# Patient Record
Sex: Female | Born: 1965
Health system: Southern US, Community
[De-identification: ages and names within clinical notes are randomized; demographics above are authoritative.]

## PROBLEM LIST (undated history)

## (undated) DIAGNOSIS — I10 Essential (primary) hypertension: Secondary | ICD-10-CM

## (undated) DIAGNOSIS — I219 Acute myocardial infarction, unspecified: Secondary | ICD-10-CM

## (undated) DIAGNOSIS — E7800 Pure hypercholesterolemia, unspecified: Secondary | ICD-10-CM

## (undated) DIAGNOSIS — I509 Heart failure, unspecified: Secondary | ICD-10-CM

## (undated) DIAGNOSIS — E119 Type 2 diabetes mellitus without complications: Secondary | ICD-10-CM

## (undated) DIAGNOSIS — I639 Cerebral infarction, unspecified: Secondary | ICD-10-CM

## (undated) DIAGNOSIS — C539 Malignant neoplasm of cervix uteri, unspecified: Secondary | ICD-10-CM

## (undated) DIAGNOSIS — R87629 Unspecified abnormal cytological findings in specimens from vagina: Secondary | ICD-10-CM

## (undated) HISTORY — DX: Heart failure, unspecified: I50.9

## (undated) HISTORY — PX: TUBAL LIGATION: SHX77

## (undated) HISTORY — DX: Cerebral infarction, unspecified: I63.9

## (undated) HISTORY — DX: Unspecified abnormal cytological findings in specimens from vagina: R87.629

---

## 1990-02-21 HISTORY — PX: CHOLECYSTECTOMY: SHX55

## 1998-02-21 DIAGNOSIS — E119 Type 2 diabetes mellitus without complications: Secondary | ICD-10-CM

## 1998-02-21 HISTORY — DX: Type 2 diabetes mellitus without complications: E11.9

## 2002-02-21 HISTORY — PX: CORONARY STENT PLACEMENT: SHX1402

## 2002-11-22 ENCOUNTER — Inpatient Hospital Stay (HOSPITAL_COMMUNITY): Admission: RE | Admit: 2002-11-22 | Discharge: 2002-11-25 | Payer: Self-pay | Admitting: Cardiology

## 2014-06-19 ENCOUNTER — Emergency Department (HOSPITAL_COMMUNITY)
Admission: EM | Admit: 2014-06-19 | Discharge: 2014-06-19 | Disposition: A | Discharge status: Home / Self Care | Payer: Self-pay | Attending: Emergency Medicine | Admitting: Emergency Medicine

## 2014-06-19 ENCOUNTER — Encounter (HOSPITAL_COMMUNITY): Payer: Self-pay | Admitting: *Deleted

## 2014-06-19 DIAGNOSIS — A599 Trichomoniasis, unspecified: Secondary | ICD-10-CM

## 2014-06-19 DIAGNOSIS — I159 Secondary hypertension, unspecified: Secondary | ICD-10-CM | POA: Insufficient documentation

## 2014-06-19 DIAGNOSIS — I252 Old myocardial infarction: Secondary | ICD-10-CM | POA: Insufficient documentation

## 2014-06-19 DIAGNOSIS — E780 Pure hypercholesterolemia: Secondary | ICD-10-CM | POA: Insufficient documentation

## 2014-06-19 DIAGNOSIS — A5901 Trichomonal vulvovaginitis: Secondary | ICD-10-CM | POA: Insufficient documentation

## 2014-06-19 DIAGNOSIS — R739 Hyperglycemia, unspecified: Secondary | ICD-10-CM

## 2014-06-19 DIAGNOSIS — N390 Urinary tract infection, site not specified: Secondary | ICD-10-CM | POA: Insufficient documentation

## 2014-06-19 DIAGNOSIS — E1165 Type 2 diabetes mellitus with hyperglycemia: Secondary | ICD-10-CM | POA: Insufficient documentation

## 2014-06-19 DIAGNOSIS — N730 Acute parametritis and pelvic cellulitis: Secondary | ICD-10-CM | POA: Insufficient documentation

## 2014-06-19 HISTORY — DX: Type 2 diabetes mellitus without complications: E11.9

## 2014-06-19 HISTORY — DX: Pure hypercholesterolemia, unspecified: E78.00

## 2014-06-19 HISTORY — DX: Essential (primary) hypertension: I10

## 2014-06-19 HISTORY — DX: Acute myocardial infarction, unspecified: I21.9

## 2014-06-19 LAB — WET PREP, GENITAL: YEAST WET PREP: NONE SEEN

## 2014-06-19 LAB — URINALYSIS, ROUTINE W REFLEX MICROSCOPIC
Bilirubin Urine: NEGATIVE
Glucose, UA: >1000 mg/dL — AB
KETONES UR: NEGATIVE mg/dL
NITRITE: POSITIVE — AB
Protein, ur: 30 mg/dL — AB
Specific Gravity, Urine: 1.029 (ref 1.005–1.030)
Urobilinogen, UA: 0.2 mg/dL (ref 0.0–1.0)
pH: 5.5 (ref 5.0–8.0)

## 2014-06-19 LAB — URINE MICROSCOPIC-ADD ON

## 2014-06-19 LAB — RAPID HIV SCREEN (HIV 1/2 AB+AG)
HIV 1/2 ANTIBODIES: NONREACTIVE
HIV-1 P24 Antigen - HIV24: NONREACTIVE

## 2014-06-19 LAB — CBG MONITORING, ED: Glucose-Capillary: 275 mg/dL — ABNORMAL HIGH (ref 70–99)

## 2014-06-19 MED ORDER — DOXYCYCLINE HYCLATE 100 MG PO CAPS: 100.0000 mg | ORAL_CAPSULE | Freq: Two times a day (BID) | ORAL | Status: DC

## 2014-06-19 MED ORDER — CEPHALEXIN 500 MG PO CAPS: 500.0000 mg | ORAL_CAPSULE | Freq: Four times a day (QID) | ORAL | Status: DC

## 2014-06-19 MED ORDER — LIDOCAINE HCL (PF) 1 % IJ SOLN
INTRAMUSCULAR | Status: AC
  Administered 2014-06-19: 5 mL
  Filled 2014-06-19: qty 5

## 2014-06-19 MED ORDER — METRONIDAZOLE 500 MG PO TABS
2000.0000 mg | ORAL_TABLET | Freq: Once | ORAL | Status: AC
  Administered 2014-06-19: 2000 mg via ORAL
  Filled 2014-06-19: qty 4

## 2014-06-19 MED ORDER — METFORMIN HCL 500 MG PO TABS: 500.0000 mg | ORAL_TABLET | Freq: Two times a day (BID) | ORAL | Status: DC

## 2014-06-19 MED ORDER — AZITHROMYCIN 250 MG PO TABS
1000.0000 mg | ORAL_TABLET | Freq: Once | ORAL | Status: AC
  Administered 2014-06-19: 1000 mg via ORAL
  Filled 2014-06-19: qty 4

## 2014-06-19 MED ORDER — CEFTRIAXONE SODIUM 250 MG IJ SOLR
250.0000 mg | Freq: Once | INTRAMUSCULAR | Status: AC
  Administered 2014-06-19: 250 mg via INTRAMUSCULAR
  Filled 2014-06-19: qty 250

## 2014-06-19 NOTE — Care Management Note (Signed)
Case Management Note  Patient Details  Name: Monica Carlson MRN: [id number] Date of Birth: 1965/05/07  Subjective/Objective:   Pt states she stays at friends home, but sleeps in her car.  She has not taken medications in years; as she has not insurance.                         Action/Plan:  Taiden Raybourn J. Clydene Laming, RN, [place], [place] [phone] ED CM consulted to meet with patient concerning f/u care with PCP and patient does not have insurance. Pt presented to [facility] ED today with vaginal pain.  Met with patient at bedside, confirmed informaton. Pt  reports not having access to f/u care with PCP, or insurance coverage. Discussed with patient importance and benefits of  establishing PCP, and not utilizing the ED for primary care needs. Pt verbalized understanding and is in agreement. Discussed other options, provided list of local  affordable PCPs.  Pt voiced interest in the [facility] and [place].  [facility] Brochure given with address, phone number, and the services highlighted. Explained that there is a Customer service manager on site who will assist with [employer] and process. Instructed to call or walk in Monday - Friday between the hours of 9- 5:30p to schedule appt for establishing care for PCP. Pt verbalized understanding.   LCSW consulted to provide homeless infomation in her county.    Expected Discharge Date:  06/19/14               Expected Discharge Plan:  Home/Self Care  In-House Referral:  Clinical Social Work  Discharge planning Services  CM Consult  Post Acute Care Choice:  NA Choice offered to:  NA  DME Arranged:    DME Agency:     HH Arranged:  NA HH Agency:     Status of Service:  Completed, signed off  Medicare Important Message Given:    Date Medicare IM Given:    Medicare IM give by:    Date Additional Medicare IM Given:    Additional Medicare Important Message give by:     If discussed at [place] of Stay Meetings, dates discussed:     Additional Comments:  Fuller Mandril, RN 06/19/2014, 9:06 AM

## 2014-06-19 NOTE — Progress Notes (Signed)
Chart reviewed.  Spoke with pt concerning his medications and not being able to afford them.  Explained the [place]- Medication Assistance program to pt. Pt understands that the [facility] program is only a one time in one year from the date of discharge to next year. Pt also understands that there is a $3.00 co pay for each prescription.

## 2014-06-19 NOTE — ED Provider Notes (Signed)
CSN: [id number]     Arrival date & time 06/19/14  0813 History   First MD Initiated Contact with Patient 06/19/14 [phone]     Chief Complaint  Patient presents with  . Vaginal Discharge     (Consider location/radiation/quality/duration/timing/severity/associated sxs/prior Treatment) HPI Monica Carlson is a 49 y.o. female with history of diabetes, hypertension, MI, presents to emergency department complaining of vaginal discharge. Patient states she notices discharge about a month ago. She states his wife, copious. She admits to being sexually active with 1 partner. She denies any pain with intercourse. She denies using any personal products other than douching after intercourse . She denies any abdominal pain. She denies any pelvic pain. She denies any dysuria, hematuria, urinary frequency or urgency. She states she has not had a menstrual cycle in a few years. She denies any fever or chills. She states she is currently not taking her blood pressure diabetes medications because she does not have any money for it. She does not have a primary care doctor. She states she lives in a car.  Past Medical History  Diagnosis Date  . MI (myocardial infarction)   . Diabetes mellitus without complication   . Hypertension   . High cholesterol    Past Surgical History  Procedure Laterality Date  . Coronary stent placement    . Cholecystectomy     No family history on file. History  Substance Use Topics  . Smoking status: Never Smoker   . Smokeless tobacco: Not on file  . Alcohol Use: No   OB History    No data available     Review of Systems  Constitutional: Negative for fever and chills.  Respiratory: Negative for cough, chest tightness and shortness of breath.   Cardiovascular: Negative for chest pain, palpitations and leg swelling.  Gastrointestinal: Negative for nausea, vomiting, abdominal pain and diarrhea.  Genitourinary: Positive for vaginal discharge. Negative for dysuria, flank pain,  vaginal bleeding, vaginal pain and pelvic pain.  Musculoskeletal: Negative for myalgias, arthralgias, neck pain and neck stiffness.  Skin: Negative for rash.  Neurological: Negative for dizziness, weakness and headaches.  All other systems reviewed and are negative.     Allergies  Review of patient's allergies indicates no known allergies.  Home Medications   Prior to Admission medications   Not on File   BP 173/105 mmHg  Pulse 90  Temp(Src) 97.9 F (36.6 C) (Oral)  Resp 19  SpO2 98% Physical Exam  Constitutional: She appears well-developed and well-nourished. No distress.  HENT:  Head: Normocephalic.  Eyes: Conjunctivae are normal.  Neck: Neck supple.  Cardiovascular: Normal rate, regular rhythm and normal heart sounds.   Pulmonary/Chest: Effort normal and breath sounds normal. No respiratory distress. She has no wheezes. She has no rales.  Abdominal: Soft. Bowel sounds are normal. She exhibits no distension. There is no tenderness. There is no rebound and no guarding.  Genitourinary:  Mild erythema of labia minora, otherwise unremarkable external genitalia. Yellowish green thick vaginal and cervical discharge. Cervix erythematous, friable. Positive CMT and uterine tenderness. No adnexal tenderness  Musculoskeletal: She exhibits no edema.  Neurological: She is alert.  Skin: Skin is warm and dry.  Psychiatric: She has a normal mood and affect. Her behavior is normal.  Nursing note and vitals reviewed.   ED Course  Procedures (including critical care time) Labs Review Labs Reviewed  WET PREP, GENITAL  URINALYSIS, ROUTINE W REFLEX MICROSCOPIC  GC/CHLAMYDIA PROBE AMP ()    Imaging Review No  results found.   EKG Interpretation None      MDM   Final diagnoses:  PID (acute pelvic inflammatory disease)  Trichimoniasis  UTI (lower urinary tract infection)  Hyperglycemia  Secondary hypertension, unspecified    Pt in ED with vaginal discharge. Exam  suspicious for PID. STI panel drawn. UA ordered. Pt apparently also not taking her diabetes and BP medications and lives in a car. Will contact case management.   10:45 AM Pt's wet prep and UA positive for trichomonas. Pt covered with rocephin 250mg  IM, zithromax 1g PO, flagyl 2g PO. She will go home with doxycycline and keflex for PID and UTI. She is otherwise non toxic appearing. She is afebrile with no signs of systemic illness.  CBG 275. Will restart metformin.   Case management spoke with pt, she ws given resourced to contact for PCP. She was also matched to help her fill her doxycycline. She will follow up as soon as she can. Return precautions discussed.   Filed Vitals:   06/19/14 0823 06/19/14 0915 06/19/14 0930 06/19/14 0938  BP: 173/105 147/96 163/75 163/75  Pulse: 90 83 79 80  Temp: 97.9 F (36.6 C)     TempSrc: Oral     Resp: 19   18  SpO2: 98% 94% 95% 96%       Jeannett Senior, PA-C 06/19/14 [place], MD 06/19/14 1048

## 2014-06-19 NOTE — ED Notes (Signed)
CBG = 275  RN Santiago Glad informed of result.

## 2014-06-19 NOTE — Discharge Instructions (Signed)
Take metformin for your blood sugar daily. Take doxycycline and keflex until all gone for your pelvic infection and uti. Make sure your partner gets treated as well. Follow up with primary care doctor as soon as able. Return if worsening symptoms.   Pelvic Inflammatory Disease Pelvic inflammatory disease (PID) refers to an infection in some or all of the female organs. The infection can be in the uterus, ovaries, fallopian tubes, or the surrounding tissues in the pelvis. PID can cause abdominal or pelvic pain that comes on suddenly (acute pelvic pain). PID is a serious infection because it can lead to lasting (chronic) pelvic pain or the inability to have children (infertile).  CAUSES  The infection is often caused by the normal bacteria found in the vaginal tissues. PID may also be caused by an infection that is spread during sexual contact. PID can also occur following:   The birth of a baby.   A miscarriage.   An abortion.   Major pelvic surgery.   The use of an intrauterine device (IUD).   A sexual assault.  RISK FACTORS Certain factors can put a person at higher risk for PID, such as:  Being younger than 25 years.  Being sexually active at [place] age.  Usingnonbarrier contraception.  Havingmultiple sexual partners.  Having sex with someone who has symptoms of a genital infection.  Using oral contraception. Other times, certain behaviors can increase the possibility of getting PID, such as:  Having sex during your period.  Using a vaginal douche.  Having an intrauterine device (IUD) in place. SYMPTOMS   Abdominal or pelvic pain.   Fever.   Chills.   Abnormal vaginal discharge.  Abnormal uterine bleeding.   Unusual pain shortly after finishing your period. DIAGNOSIS  Your caregiver will choose some of the following methods to make a diagnosis, such as:   Performinga physical exam and history. A pelvic exam typically reveals a very tender uterus  and surrounding pelvis.   Ordering laboratory tests including a pregnancy test, blood tests, and urine test.  Orderingcultures of the vagina and cervix to check for a sexually transmitted infection (STI).  Performing an ultrasound.   Performing a laparoscopic procedure to look inside the pelvis.  TREATMENT   Antibiotic medicines may be prescribed and taken by mouth.   Sexual partners may be treated when the infection is caused by a sexually transmitted disease (STD).   Hospitalization may be needed to give antibiotics intravenously.  Surgery may be needed, but this is rare. It may take weeks until you are completely well. If you are diagnosed with PID, you should also be checked for human immunodeficiency virus (HIV). HOME CARE INSTRUCTIONS   If given, take your antibiotics as directed. Finish the medicine even if you start to feel better.   Only take over-the-counter or prescription medicines for pain, discomfort, or fever as directed by your caregiver.   Do not have sexual intercourse until treatment is completed or as directed by your caregiver. If PID is confirmed, your recent sexual partner(s) will need treatment.   Keep your follow-up appointments. SEEK MEDICAL CARE IF:   You have increased or abnormal vaginal discharge.   You need prescription medicine for your pain.   You vomit.   You cannot take your medicines.   Your partner has an STD.  SEEK IMMEDIATE MEDICAL CARE IF:   You   have a fever.   You have increased abdominal or pelvic pain.   You have chills.  You have pain when you urinate.   You are not better after 72 hours following treatment.  MAKE SURE YOU:   Understand these instructions.  Will watch your condition.  Will get help right away if you are not doing well or get worse. Document Released: 02/07/2005 Document Revised: 06/04/2012 Document Reviewed: 02/03/2011 [facility] Patient Information 2015 [place], [place].  This information is not intended to replace advice given to you by your health care provider. Make sure you discuss any questions you have with your health care provider.  Urinary Tract Infection Urinary tract infections (UTIs) can develop anywhere along your urinary tract. Your urinary tract is your body's drainage system for removing wastes and extra water. Your urinary tract includes two kidneys, two ureters, a bladder, and a urethra. Your kidneys are a pair of bean-shaped organs. Each kidney is about the size of your fist. They are located below your ribs, one on each side of your spine. CAUSES Infections are caused by microbes, which are microscopic organisms, including fungi, viruses, and bacteria. These organisms are so small that they can only be seen through a microscope. Bacteria are the microbes that most commonly cause UTIs. SYMPTOMS  Symptoms of UTIs may vary by age and gender of the patient and by the location of the infection. Symptoms in young women typically include a frequent and intense urge to urinate and a painful, burning feeling in the bladder or urethra during urination. Older women and men are more likely to be tired, shaky, and weak and have muscle aches and abdominal pain. A fever may mean the infection is in your kidneys. Other symptoms of a kidney infection include pain in your back or sides below the ribs, nausea, and vomiting. DIAGNOSIS To diagnose a UTI, your caregiver will ask you about your symptoms. Your caregiver also will ask to provide a urine sample. The urine sample will be tested for bacteria and white blood cells. White blood cells are made by your body to help fight infection. TREATMENT  Typically, UTIs can be treated with medication. Because most UTIs are caused by a bacterial infection, they usually can be treated with the use of antibiotics. The choice of antibiotic and length of treatment depend on your symptoms and the type of bacteria causing your  infection. HOME CARE INSTRUCTIONS  If you were prescribed antibiotics, take them exactly as your caregiver instructs you. Finish the medication even if you feel better after you have only taken some of the medication.  Drink enough water and fluids to keep your urine clear or pale yellow.  Avoid caffeine, tea, and carbonated beverages. They tend to irritate your bladder.  Empty your bladder often. Avoid holding urine for long periods of time.  Empty your bladder before and after sexual intercourse.  After a bowel movement, women should cleanse from front to back. Use each tissue only once. SEEK MEDICAL CARE IF:   You have back pain.  You develop a fever.  Your symptoms do not begin to resolve within 3 days. SEEK IMMEDIATE MEDICAL CARE IF:   You have severe back pain or lower abdominal pain.  You develop chills.  You have nausea or vomiting.  You have continued burning or discomfort with urination. MAKE SURE YOU:   Understand these instructions.  Will watch your condition.  Will get help right away if you are not doing well or get worse. Document Released: 11/17/2004 Document Revised: 08/09/2011 Document Reviewed: 03/18/2011 [facility] Patient Information 2015 [place], [place]. This information is not  intended to replace advice given to you by your health care provider. Make sure you discuss any questions you have with your health care provider. ° °

## 2014-06-19 NOTE — ED Notes (Signed)
Pt reports vaginal discharge x 1 month. No urinary symptoms.

## 2014-06-19 NOTE — Discharge Planning (Signed)
NCM provided [facility].

## 2014-06-19 NOTE — ED Notes (Signed)
Pelvic cart at bedside. 

## 2014-06-20 LAB — GC/CHLAMYDIA PROBE AMP (~~LOC~~) NOT AT ARMC
Chlamydia: NEGATIVE
NEISSERIA GONORRHEA: NEGATIVE

## 2014-06-20 LAB — RPR: RPR Ser Ql: NONREACTIVE

## 2014-07-23 DIAGNOSIS — I509 Heart failure, unspecified: Secondary | ICD-10-CM

## 2014-07-23 HISTORY — DX: Heart failure, unspecified: I50.9

## 2015-04-29 ENCOUNTER — Encounter: Payer: Self-pay | Admitting: Physician Assistant

## 2015-04-29 ENCOUNTER — Ambulatory Visit: Payer: Self-pay | Admitting: Physician Assistant

## 2015-04-29 VITALS — BP 146/84 | HR 78 | Temp 97.9°F | Ht 62.75 in | Wt 179.0 lb

## 2015-04-29 DIAGNOSIS — E118 Type 2 diabetes mellitus with unspecified complications: Principal | ICD-10-CM

## 2015-04-29 DIAGNOSIS — E1165 Type 2 diabetes mellitus with hyperglycemia: Secondary | ICD-10-CM

## 2015-04-29 DIAGNOSIS — Z131 Encounter for screening for diabetes mellitus: Secondary | ICD-10-CM

## 2015-04-29 DIAGNOSIS — I43 Cardiomyopathy in diseases classified elsewhere: Secondary | ICD-10-CM

## 2015-04-29 DIAGNOSIS — Z8673 Personal history of transient ischemic attack (TIA), and cerebral infarction without residual deficits: Secondary | ICD-10-CM

## 2015-04-29 DIAGNOSIS — E669 Obesity, unspecified: Secondary | ICD-10-CM

## 2015-04-29 DIAGNOSIS — Z955 Presence of coronary angioplasty implant and graft: Secondary | ICD-10-CM

## 2015-04-29 DIAGNOSIS — I2510 Atherosclerotic heart disease of native coronary artery without angina pectoris: Secondary | ICD-10-CM

## 2015-04-29 DIAGNOSIS — I10 Essential (primary) hypertension: Secondary | ICD-10-CM

## 2015-04-29 DIAGNOSIS — I110 Hypertensive heart disease with heart failure: Secondary | ICD-10-CM

## 2015-04-29 DIAGNOSIS — I252 Old myocardial infarction: Secondary | ICD-10-CM

## 2015-04-29 LAB — GLUCOSE, POCT (MANUAL RESULT ENTRY): POC Glucose: 88 mg/dl (ref 70–99)

## 2015-04-29 NOTE — Progress Notes (Signed)
BP 146/84 mmHg  Pulse 78  Temp(Src) 97.9 F (36.6 C)  Ht 5' 2.75" (1.594 m)  Wt 179 lb (81.194 kg)  BMI 31.96 kg/m2  SpO2 97%   Subjective:    Patient ID: Monica Carlson, female    DOB: [birth year], 50 y.o.   MRN: [id number]  HPI: Monica Carlson is a 50 y.o. female presenting on 04/29/2015 for New Patient (Initial Visit)   HPI   [facility]  Pt [facility] to [facility] to [facility] but that was 3 years ago.  All the meds she is taking now were given from when she was in the hospital last august with CVA.    Aug 2016- cva- [facility] June 2016 - chf- [facility]  Pt was on lipitor but stopped it= couldn't afford it  Echo- 07/31/14- [facility], EF 50-55% [facility]- head ct/10/03/14 Brain mri/10/03/14 Neck MRA-10/03/14 Head MRA8/12/16 Carotid US- 10/06/14   Relevant past medical, surgical, family and social history reviewed and updated as indicated. Interim medical history since our last visit reviewed. Allergies and medications reviewed and updated.    Current outpatient prescriptions:  .  acetaminophen (TYLENOL) 500 MG tablet, Take 1,500 mg by mouth daily as needed for headache., Disp: , Rfl:  .  aspirin EC 325 MG tablet, Take 325 mg by mouth daily., Disp: , Rfl:  .  Bisacodyl (LAXATIVE PO), Take 1 tablet by mouth as needed., Disp: , Rfl:  .  FLUoxetine (PROZAC) 20 MG capsule, Take 20 mg by mouth daily. At 6 pm, Disp: , Rfl:  .  glimepiride (AMARYL) 2 MG tablet, Take 2 mg by mouth every morning. At 8 am, Disp: , Rfl:  .  lisinopril (PRINIVIL,ZESTRIL) 10 MG tablet, Take 20 mg by mouth daily. At 6 pm, Disp: , Rfl:  .  metFORMIN (GLUCOPHAGE) 500 MG tablet, Take 1 tablet (500 mg total) by mouth 2 (two) times daily with a meal., Disp: 60 tablet, Rfl: 2 .  Multiple Vitamins-Minerals (VISION FORMULA/LUTEIN) TABS, Take 1 tablet by mouth at bedtime., Disp: , Rfl:  .  Omega-3 Fatty Acids (FISH OIL PO), Take 1,000 mg by mouth daily., Disp: , Rfl:    Review of Systems  Constitutional: Positive for  unexpected weight change. Negative for fever, chills, diaphoresis, appetite change and fatigue.  HENT: Positive for dental problem. Negative for congestion, drooling, ear pain, facial swelling, hearing loss, mouth sores, sneezing, sore throat, trouble swallowing and voice change.   Eyes: Negative for pain, discharge, redness, itching and visual disturbance.  Respiratory: Positive for shortness of breath. Negative for cough, choking and wheezing.   Cardiovascular: Negative for chest pain, palpitations and leg swelling.  Gastrointestinal: Negative for vomiting, abdominal pain, diarrhea, constipation and blood in stool.  Endocrine: Negative for cold intolerance, heat intolerance and polydipsia.  Genitourinary: Negative for dysuria, hematuria and decreased urine volume.  Musculoskeletal: Positive for back pain. Negative for arthralgias and gait problem.  Skin: Negative for rash.  Allergic/Immunologic: Negative for environmental allergies.  Neurological: Positive for headaches. Negative for seizures, syncope and light-headedness.  Hematological: Negative for adenopathy.  Psychiatric/Behavioral: Negative for suicidal ideas, dysphoric mood and agitation. The patient is not nervous/anxious.     Per HPI unless specifically indicated above     Objective:    BP 146/84 mmHg  Pulse 78  Temp(Src) 97.9 F (36.6 C)  Ht 5' 2.75" (1.594 m)  Wt 179 lb (81.194 kg)  BMI 31.96 kg/m2  SpO2 97%  Wt Readings from Last 3 Encounters:  04/29/15 179 lb (81.194 kg)    Physical Exam  Constitutional: She is oriented to person, place, and time. She appears well-developed and well-nourished.  HENT:  Head: Normocephalic and atraumatic.  Mouth/Throat: Oropharynx is clear and moist. No oropharyngeal exudate.  Eyes: Conjunctivae and EOM are normal. Pupils are equal, round, and reactive to light.  Neck: Neck supple. No thyromegaly present.  Cardiovascular: Normal rate and regular rhythm.   Pulmonary/Chest: Effort  normal and breath sounds normal.  Abdominal: Soft. Bowel sounds are normal. She exhibits no mass. There is no hepatosplenomegaly. There is no tenderness.  Musculoskeletal: She exhibits no edema.  Lymphadenopathy:    She has no cervical adenopathy.  Neurological: She is alert and oriented to person, place, and time. Gait normal.  Skin: Skin is warm and dry.  Psychiatric: She has a normal mood and affect. Her behavior is normal.  Vitals reviewed.   Results for orders placed or performed in visit on 04/29/15  POCT Glucose (CBG)  Result Value Ref Range   POC Glucose 88 70 - 99 mg/dl      Assessment & Plan:   Encounter Diagnoses  Name Primary?  Marland Kitchen Uncontrolled type 2 diabetes mellitus with complication, unspecified long term insulin use status ([place]) Yes  . Screening for diabetes mellitus   . Coronary artery disease involving native coronary artery of native heart without angina pectoris   . Essential hypertension, benign   . Obesity, unspecified   . Stented coronary artery   . History of MI (myocardial infarction)   . History of CVA (cerebrovascular accident)   . Hypertensive cardiomyopathy, with heart failure ([place])     -Sign up medassist- order lipitor -get Baseline labs -rec to [facility] for std testing -Pt doesn't know why she is taking prozac- denies depress/anxiety- stop prozac  -F/u 4month

## 2015-05-04 DIAGNOSIS — Z8673 Personal history of transient ischemic attack (TIA), and cerebral infarction without residual deficits: Secondary | ICD-10-CM | POA: Insufficient documentation

## 2015-05-04 DIAGNOSIS — IMO0002 Reserved for concepts with insufficient information to code with codable children: Secondary | ICD-10-CM | POA: Insufficient documentation

## 2015-05-04 DIAGNOSIS — E1165 Type 2 diabetes mellitus with hyperglycemia: Secondary | ICD-10-CM | POA: Insufficient documentation

## 2015-05-04 DIAGNOSIS — I119 Hypertensive heart disease without heart failure: Secondary | ICD-10-CM | POA: Insufficient documentation

## 2015-05-04 DIAGNOSIS — I2510 Atherosclerotic heart disease of native coronary artery without angina pectoris: Secondary | ICD-10-CM | POA: Insufficient documentation

## 2015-05-04 DIAGNOSIS — I43 Cardiomyopathy in diseases classified elsewhere: Secondary | ICD-10-CM

## 2015-05-04 DIAGNOSIS — E118 Type 2 diabetes mellitus with unspecified complications: Secondary | ICD-10-CM

## 2015-05-04 DIAGNOSIS — I252 Old myocardial infarction: Secondary | ICD-10-CM | POA: Insufficient documentation

## 2015-05-04 DIAGNOSIS — E669 Obesity, unspecified: Secondary | ICD-10-CM | POA: Insufficient documentation

## 2015-05-04 DIAGNOSIS — Z955 Presence of coronary angioplasty implant and graft: Secondary | ICD-10-CM | POA: Insufficient documentation

## 2015-05-04 DIAGNOSIS — I10 Essential (primary) hypertension: Secondary | ICD-10-CM | POA: Insufficient documentation

## 2015-05-04 DIAGNOSIS — E1121 Type 2 diabetes mellitus with diabetic nephropathy: Secondary | ICD-10-CM | POA: Insufficient documentation

## 2015-05-04 MED ORDER — ATORVASTATIN CALCIUM 20 MG PO TABS: 20.0000 mg | ORAL_TABLET | Freq: Every day | ORAL | Status: DC

## 2015-05-05 ENCOUNTER — Other Ambulatory Visit: Payer: Self-pay | Admitting: Physician Assistant

## 2015-05-05 MED ORDER — ATORVASTATIN CALCIUM 20 MG PO TABS: 20.0000 mg | ORAL_TABLET | Freq: Every day | ORAL | Status: DC

## 2015-05-22 ENCOUNTER — Other Ambulatory Visit: Payer: Self-pay | Admitting: Physician Assistant

## 2015-05-22 LAB — LIPID PANEL
Cholesterol: 261 mg/dL — ABNORMAL HIGH (ref 125–200)
HDL: 30 mg/dL — ABNORMAL LOW (ref >=46)
Total CHOL/HDL Ratio: 8.7 Ratio — ABNORMAL HIGH (ref <=5.0)
Triglycerides: 747 mg/dL — ABNORMAL HIGH (ref <150)

## 2015-05-22 LAB — COMPLETE METABOLIC PANEL WITH GFR
ALT: 10 U/L (ref 6–29)
AST: 10 U/L (ref 10–35)
Albumin: 4.1 g/dL (ref 3.6–5.1)
Alkaline Phosphatase: 67 U/L (ref 33–130)
BUN: 13 mg/dL (ref 7–25)
CHLORIDE: 100 mmol/L (ref 98–110)
CO2: 28 mmol/L (ref 20–31)
Calcium: 9.5 mg/dL (ref 8.6–10.4)
Creat: 0.6 mg/dL (ref 0.50–1.05)
GFR, Est African American: >89 mL/min (ref >=60)
GFR, Est Non African American: >89 mL/min (ref >=60)
GLUCOSE: 104 mg/dL — AB (ref 65–99)
POTASSIUM: 4.6 mmol/L (ref 3.5–5.3)
SODIUM: 138 mmol/L (ref 135–146)
Total Bilirubin: 0.3 mg/dL (ref 0.2–1.2)
Total Protein: 7.1 g/dL (ref 6.1–8.1)

## 2015-05-22 LAB — HEMOGLOBIN A1C
Hgb A1c MFr Bld: 7.8 % — ABNORMAL HIGH (ref <5.7)
MEAN PLASMA GLUCOSE: 177 mg/dL

## 2015-05-22 LAB — CBC WITH DIFFERENTIAL/PLATELET
Basophils Absolute: 0 10*3/uL (ref 0.0–0.1)
Basophils Relative: 0 % (ref 0–1)
Eosinophils Absolute: 0.1 10*3/uL (ref 0.0–0.7)
Eosinophils Relative: 1 % (ref 0–5)
HCT: 38.4 % (ref 36.0–46.0)
Hemoglobin: 13.1 g/dL (ref 12.0–15.0)
LYMPHS ABS: 2.9 10*3/uL (ref 0.7–4.0)
Lymphocytes Relative: 32 % (ref 12–46)
MCH: 31.5 pg (ref 26.0–34.0)
MCHC: 34.1 g/dL (ref 30.0–36.0)
MCV: 92.3 fL (ref 78.0–100.0)
MONO ABS: 0.6 10*3/uL (ref 0.1–1.0)
MONOS PCT: 6 % (ref 3–12)
MPV: 10.7 fL (ref 8.6–12.4)
NEUTROS ABS: 5.6 10*3/uL (ref 1.7–7.7)
Neutrophils Relative %: 61 % (ref 43–77)
Platelets: 345 10*3/uL (ref 150–400)
RBC: 4.16 MIL/uL (ref 3.87–5.11)
RDW: 14.2 % (ref 11.5–15.5)
WBC: 9.2 10*3/uL (ref 4.0–10.5)

## 2015-05-22 LAB — TSH: TSH: 7.43 mIU/L — ABNORMAL HIGH

## 2015-05-23 LAB — MICROALBUMIN, URINE: Microalb, Ur: 2.1 mg/dL

## 2015-05-27 ENCOUNTER — Ambulatory Visit: Payer: Self-pay | Admitting: Physician Assistant

## 2015-05-27 ENCOUNTER — Encounter: Payer: Self-pay | Admitting: Physician Assistant

## 2015-05-27 VITALS — BP 154/90 | HR 92 | Temp 97.7°F | Ht 62.75 in | Wt 188.0 lb

## 2015-05-27 DIAGNOSIS — E118 Type 2 diabetes mellitus with unspecified complications: Principal | ICD-10-CM

## 2015-05-27 DIAGNOSIS — E785 Hyperlipidemia, unspecified: Secondary | ICD-10-CM

## 2015-05-27 DIAGNOSIS — E1165 Type 2 diabetes mellitus with hyperglycemia: Secondary | ICD-10-CM

## 2015-05-27 DIAGNOSIS — Z1239 Encounter for other screening for malignant neoplasm of breast: Secondary | ICD-10-CM

## 2015-05-27 DIAGNOSIS — E669 Obesity, unspecified: Secondary | ICD-10-CM

## 2015-05-27 DIAGNOSIS — I10 Essential (primary) hypertension: Secondary | ICD-10-CM

## 2015-05-27 DIAGNOSIS — I2510 Atherosclerotic heart disease of native coronary artery without angina pectoris: Secondary | ICD-10-CM

## 2015-05-27 DIAGNOSIS — E039 Hypothyroidism, unspecified: Secondary | ICD-10-CM

## 2015-05-27 MED ORDER — LEVOTHYROXINE SODIUM 25 MCG PO CAPS: 12.5000 ug | ORAL_CAPSULE | Freq: Every day | ORAL | Status: DC

## 2015-05-27 MED ORDER — METFORMIN HCL 1000 MG PO TABS: 1000.0000 mg | ORAL_TABLET | Freq: Two times a day (BID) | ORAL | Status: DC

## 2015-05-27 MED ORDER — METOPROLOL TARTRATE 50 MG PO TABS: 50.0000 mg | ORAL_TABLET | Freq: Two times a day (BID) | ORAL | Status: DC

## 2015-05-27 NOTE — Progress Notes (Signed)
BP 164/90 mmHg  Pulse 92  Temp(Src) 97.7 F (36.5 C)  Ht 5' 2.75" (1.594 m)  Wt 188 lb (85.276 kg)  BMI 33.56 kg/m2  SpO2 96%   Subjective:    Patient ID: Monica Carlson, female    DOB: Dec 29, 1965, 50 y.o.   MRN: [id number]  HPI: Monica Carlson is a 50 y.o. female presenting on 05/27/2015 for Diabetes and Coronary Artery Disease   HPI   Pt asks about eye dr.  Martin Majestic to dr in [place] [facility] [address].  Says she needs eye surgery.  She went there in january  Pt is doing fine off the prozac  Pt stopped her fish oil b/c she got the lipitor in the mail and she didn't realize she should take both  Pt has never had mammogram  Pt states long time since pap  Relevant past medical, surgical, family and social history reviewed and updated as indicated. Interim medical history since our last visit reviewed. Allergies and medications reviewed and updated.    Current outpatient prescriptions:  .  acetaminophen (TYLENOL) 500 MG tablet, Take 1,500 mg by mouth daily as needed for headache., Disp: , Rfl:  .  aspirin EC 325 MG tablet, Take 325 mg by mouth daily., Disp: , Rfl:  .  atorvastatin (LIPITOR) 20 MG tablet, Take 1 tablet (20 mg total) by mouth daily., Disp: 90 tablet, Rfl: 1 .  Bisacodyl (LAXATIVE PO), Take 1 tablet by mouth as needed., Disp: , Rfl:  .  glimepiride (AMARYL) 2 MG tablet, Take 2 mg by mouth every morning. At 8 am, Disp: , Rfl:  .  lisinopril (PRINIVIL,ZESTRIL) 10 MG tablet, Take 20 mg by mouth daily. At 6 pm, Disp: , Rfl:  .  metFORMIN (GLUCOPHAGE) 500 MG tablet, Take 1 tablet (500 mg total) by mouth 2 (two) times daily with a meal., Disp: 60 tablet, Rfl: 2 .  Multiple Vitamins-Minerals (VISION FORMULA/LUTEIN) TABS, Take 1 tablet by mouth at bedtime., Disp: , Rfl:    Review of Systems  Constitutional: Negative for fever, chills, diaphoresis, appetite change, fatigue and unexpected weight change.  HENT: Negative for congestion, dental problem, drooling, ear pain, facial  swelling, hearing loss, mouth sores, sneezing, sore throat, trouble swallowing and voice change.   Eyes: Negative for pain, discharge, redness, itching and visual disturbance.  Respiratory: Negative for cough, choking, shortness of breath and wheezing.   Cardiovascular: Negative for chest pain, palpitations and leg swelling.  Gastrointestinal: Negative for vomiting, abdominal pain, diarrhea, constipation and blood in stool.  Endocrine: Negative for cold intolerance, heat intolerance and polydipsia.  Genitourinary: Negative for dysuria, hematuria and decreased urine volume.  Musculoskeletal: Positive for back pain. Negative for arthralgias and gait problem.  Skin: Negative for rash.  Allergic/Immunologic: Negative for environmental allergies.  Neurological: Positive for headaches. Negative for seizures, syncope and light-headedness.  Hematological: Negative for adenopathy.  Psychiatric/Behavioral: Negative for suicidal ideas, dysphoric mood and agitation. The patient is not nervous/anxious.     Per HPI unless specifically indicated above     Objective:    BP 164/90 mmHg  Pulse 92  Temp(Src) 97.7 F (36.5 C)  Ht 5' 2.75" (1.594 m)  Wt 188 lb (85.276 kg)  BMI 33.56 kg/m2  SpO2 96%  Wt Readings from Last 3 Encounters:  05/27/15 188 lb (85.276 kg)  04/29/15 179 lb (81.194 kg)    Physical Exam  Constitutional: She is oriented to person, place, and time. She appears well-developed and well-nourished.  HENT:  Head: Normocephalic  and atraumatic.  Neck: Neck supple.  Cardiovascular: Normal rate and regular rhythm.   Pulmonary/Chest: Effort normal and breath sounds normal.  Abdominal: Soft. Bowel sounds are normal. She exhibits no mass. There is no hepatosplenomegaly. There is no tenderness.  Musculoskeletal: She exhibits no edema.  Lymphadenopathy:    She has no cervical adenopathy.  Neurological: She is alert and oriented to person, place, and time.  Skin: Skin is warm and dry.   Psychiatric: She has a normal mood and affect. Her behavior is normal.  Vitals reviewed.     Foot exam done  Results for orders placed or performed in visit on 05/22/15  COMPLETE METABOLIC PANEL WITH GFR  Result Value Ref Range   Sodium 138 135 - 146 mmol/L   Potassium 4.6 3.5 - 5.3 mmol/L   Chloride 100 98 - 110 mmol/L   CO2 28 20 - 31 mmol/L   Glucose, Bld 104 (H) 65 - 99 mg/dL   BUN 13 7 - 25 mg/dL   Creat 0.60 0.50 - 1.05 mg/dL   Total Bilirubin 0.3 0.2 - 1.2 mg/dL   Alkaline Phosphatase 67 33 - 130 U/L   AST 10 10 - 35 U/L   ALT 10 6 - 29 U/L   Total Protein 7.1 6.1 - 8.1 g/dL   Albumin 4.1 3.6 - 5.1 g/dL   Calcium 9.5 8.6 - 10.4 mg/dL   GFR, Est African American >89 >=60 mL/min   GFR, Est Non African American >89 >=60 mL/min  CBC with Differential/Platelet  Result Value Ref Range   WBC 9.2 4.0 - 10.5 K/uL   RBC 4.16 3.87 - 5.11 MIL/uL   Hemoglobin 13.1 12.0 - 15.0 g/dL   HCT 38.4 36.0 - 46.0 %   MCV 92.3 78.0 - 100.0 fL   MCH 31.5 26.0 - 34.0 pg   MCHC 34.1 30.0 - 36.0 g/dL   RDW 14.2 11.5 - 15.5 %   Platelets 345 150 - 400 K/uL   MPV 10.7 8.6 - 12.4 fL   Neutrophils Relative % 61 43 - 77 %   Neutro Abs 5.6 1.7 - 7.7 K/uL   Lymphocytes Relative 32 12 - 46 %   Lymphs Abs 2.9 0.7 - 4.0 K/uL   Monocytes Relative 6 3 - 12 %   Monocytes Absolute 0.6 0.1 - 1.0 K/uL   Eosinophils Relative 1 0 - 5 %   Eosinophils Absolute 0.1 0.0 - 0.7 K/uL   Basophils Relative 0 0 - 1 %   Basophils Absolute 0.0 0.0 - 0.1 K/uL   Smear Review Criteria for review not met   Lipid panel  Result Value Ref Range   Cholesterol 261 (H) 125 - 200 mg/dL   Triglycerides 747 (H) <150 mg/dL   HDL 30 (L) >=46 mg/dL   Total CHOL/HDL Ratio 8.7 (H) <=5.0 Ratio   VLDL NOT CALC <30 mg/dL   LDL Cholesterol NOT CALC <130 mg/dL  TSH  Result Value Ref Range   TSH 7.43 (H) mIU/L  Hemoglobin A1c  Result Value Ref Range   Hgb A1c MFr Bld 7.8 (H) <5.7 %   Mean Plasma Glucose 177 mg/dL   Microalbumin, urine  Result Value Ref Range   Microalb, Ur 2.1 Not estab mg/dL      Assessment & Plan:   Encounter Diagnoses  Name Primary?  Marland Kitchen Uncontrolled type 2 diabetes mellitus with complication, unspecified long term insulin use status ([place]) Yes  . Essential hypertension, benign   . Coronary artery  disease involving native coronary artery of native heart without angina pectoris   . Hypothyroidism, unspecified hypothyroidism type   . Hyperlipidemia   . Obesity, unspecified   . Screening for breast cancer     -reviewed labs with pt -pt to restart fish oil- 3 daily -Order levothyroxine 78mg, take 1/2 po qd from [facility] -Increase metforming to  1g bid -pt to attend DM education class.  Spent time counseling pt on diabetes -Gave [facility] application- needs referral to cardiology for chf and cad -Order mammogram -report request sent to Dr HIona Hansenfrom eye exam in January -f/u OV 1 month to recheck bp

## 2015-05-27 NOTE — Patient Instructions (Addendum)
Fish oil 3 capsules daily. Thyroid medicine- levothyroxine- from [facility] Increase metformin- from [facility] Add metoprolol- from [facility] Diabetic class Turn in [facility] discount application  Please bring all meds to every appointment

## 2015-05-30 DIAGNOSIS — E785 Hyperlipidemia, unspecified: Secondary | ICD-10-CM | POA: Insufficient documentation

## 2015-05-30 DIAGNOSIS — E039 Hypothyroidism, unspecified: Secondary | ICD-10-CM | POA: Insufficient documentation

## 2015-05-30 DIAGNOSIS — E782 Mixed hyperlipidemia: Secondary | ICD-10-CM | POA: Insufficient documentation

## 2015-06-05 ENCOUNTER — Inpatient Hospital Stay (HOSPITAL_COMMUNITY): Admission: RE | Admit: 2015-06-05 | Payer: Self-pay | Source: Ambulatory Visit

## 2015-06-15 ENCOUNTER — Encounter (INDEPENDENT_AMBULATORY_CARE_PROVIDER_SITE_OTHER): Payer: Medicaid Other | Admitting: Ophthalmology

## 2015-06-15 DIAGNOSIS — E11311 Type 2 diabetes mellitus with unspecified diabetic retinopathy with macular edema: Secondary | ICD-10-CM

## 2015-06-15 DIAGNOSIS — E113521 Type 2 diabetes mellitus with proliferative diabetic retinopathy with traction retinal detachment involving the macula, right eye: Secondary | ICD-10-CM | POA: Diagnosis not present

## 2015-06-15 DIAGNOSIS — H4311 Vitreous hemorrhage, right eye: Secondary | ICD-10-CM

## 2015-06-15 DIAGNOSIS — H43813 Vitreous degeneration, bilateral: Secondary | ICD-10-CM

## 2015-06-15 DIAGNOSIS — E113512 Type 2 diabetes mellitus with proliferative diabetic retinopathy with macular edema, left eye: Secondary | ICD-10-CM | POA: Diagnosis not present

## 2015-06-19 ENCOUNTER — Encounter (HOSPITAL_COMMUNITY): Payer: Self-pay

## 2015-06-25 ENCOUNTER — Ambulatory Visit: Payer: Medicaid Other | Admitting: Physician Assistant

## 2015-06-25 ENCOUNTER — Encounter: Payer: Self-pay | Admitting: Physician Assistant

## 2015-06-25 VITALS — BP 140/80 | HR 86 | Temp 97.5°F | Ht 62.75 in | Wt 190.0 lb

## 2015-06-25 DIAGNOSIS — I2510 Atherosclerotic heart disease of native coronary artery without angina pectoris: Secondary | ICD-10-CM

## 2015-06-25 DIAGNOSIS — Z1211 Encounter for screening for malignant neoplasm of colon: Secondary | ICD-10-CM

## 2015-06-25 DIAGNOSIS — I10 Essential (primary) hypertension: Secondary | ICD-10-CM

## 2015-06-25 DIAGNOSIS — E118 Type 2 diabetes mellitus with unspecified complications: Secondary | ICD-10-CM

## 2015-06-25 DIAGNOSIS — E039 Hypothyroidism, unspecified: Secondary | ICD-10-CM

## 2015-06-25 DIAGNOSIS — Z955 Presence of coronary angioplasty implant and graft: Secondary | ICD-10-CM

## 2015-06-25 DIAGNOSIS — E785 Hyperlipidemia, unspecified: Secondary | ICD-10-CM

## 2015-06-25 DIAGNOSIS — E1165 Type 2 diabetes mellitus with hyperglycemia: Secondary | ICD-10-CM

## 2015-06-25 DIAGNOSIS — E669 Obesity, unspecified: Secondary | ICD-10-CM

## 2015-06-25 DIAGNOSIS — I509 Heart failure, unspecified: Secondary | ICD-10-CM

## 2015-06-25 MED ORDER — CITALOPRAM HYDROBROMIDE 20 MG PO TABS: 20.0000 mg | ORAL_TABLET | Freq: Every day | ORAL | Status: DC

## 2015-06-25 MED ORDER — LISINOPRIL 20 MG PO TABS: 20.0000 mg | ORAL_TABLET | Freq: Every day | ORAL | Status: DC

## 2015-06-25 NOTE — Patient Instructions (Signed)

## 2015-06-25 NOTE — Progress Notes (Signed)
BP 140/80 mmHg  Pulse 86  Temp(Src) 97.5 F (36.4 C)  Ht 5' 2.75" (1.594 m)  Wt 190 lb (86.183 kg)  BMI 33.92 kg/m2  SpO2 96%   Subjective:    Patient ID: Monica Carlson, female    DOB: December 16, 1965, 50 y.o.   MRN: [id number]  HPI: Monica Carlson is a 50 y.o. female presenting on 06/25/2015 for Hypertension; Follow-up; and Medication Problem   HPI   Chief Complaint  Patient presents with  . Hypertension  . Follow-up    pt states she went to dr. Tempie Hoist in [place] and was told she needed surgery on her R eye  . Medication Problem    pt states that the smell friom the fish oil comes out from her pores and wants to know if there is something else she could take in place of that     -Reviewed eye appointment notes from Dr Iona Hansen from 03/18/15.   Pt went to dr Zigmund Daniel- a retina specialist and he wants to do surgery with general anesthesia.  -Pt has not been to diabetic education class yet   -Pt is terrible historian.  She has no idea who put her stents in in 2004.  She insists she wasn't given a card.  She says she hasn't seen a caridiologist since 2004.  She denies cp.    -Pt states she has been having a lot of "stress" lately and wants medication for it.  Questioned more at length, she says she isn't having depression, more anxiety.  Denies SI or HI.  -review of chart shows that pt has medicaid. Front desk confirmed that it is active.  Pt insists that she didn't know that he had medicaid  Relevant past medical, surgical, family and social history reviewed and updated as indicated. Interim medical history since our last visit reviewed. Allergies and medications reviewed and updated.   Current outpatient prescriptions:  .  acetaminophen (TYLENOL) 500 MG tablet, Take 1,500 mg by mouth daily as needed for headache., Disp: , Rfl:  .  aspirin EC 325 MG tablet, Take 325 mg by mouth daily., Disp: , Rfl:  .  atorvastatin (LIPITOR) 20 MG tablet, Take 1 tablet (20 mg total) by mouth  daily., Disp: 90 tablet, Rfl: 1 .  Bisacodyl (LAXATIVE PO), Take 1 tablet by mouth as needed., Disp: , Rfl:  .  glimepiride (AMARYL) 2 MG tablet, Take 2 mg by mouth every morning. At 8 am, Disp: , Rfl:  .  Levothyroxine Sodium 25 MCG CAPS, Take 0.5 capsules (12.5 mcg total) by mouth daily before breakfast., Disp: 45 capsule, Rfl: 1 .  lisinopril (PRINIVIL,ZESTRIL) 10 MG tablet, Take 20 mg by mouth daily. At 6 pm, Disp: , Rfl:  .  metFORMIN (GLUCOPHAGE) 1000 MG tablet, Take 1 tablet (1,000 mg total) by mouth 2 (two) times daily with a meal., Disp: 180 tablet, Rfl: 1 .  metoprolol (LOPRESSOR) 50 MG tablet, Take 1 tablet (50 mg total) by mouth 2 (two) times daily., Disp: 180 tablet, Rfl: 1 .  Multiple Vitamins-Minerals (MULTIVITAMIN WITH MINERALS) tablet, Take 1 tablet by mouth daily., Disp: , Rfl:  .  Multiple Vitamins-Minerals (VISION FORMULA/LUTEIN) TABS, Take 1 tablet by mouth at bedtime., Disp: , Rfl:  .  Omega-3 Fatty Acids (FISH OIL PO), Take 3 capsules by mouth., Disp: , Rfl:  .  prednisoLONE acetate (PRED FORTE) 1 % ophthalmic suspension, Place 1 drop into the right eye 4 (four) times daily., Disp: , Rfl:   Review  of Systems No cp, sob or abdominal pain  Per HPI unless specifically indicated above     Objective:    BP 140/80 mmHg  Pulse 86  Temp(Src) 97.5 F (36.4 C)  Ht 5' 2.75" (1.594 m)  Wt 190 lb (86.183 kg)  BMI 33.92 kg/m2  SpO2 96%  Wt Readings from Last 3 Encounters:  06/25/15 190 lb (86.183 kg)  05/27/15 188 lb (85.276 kg)  04/29/15 179 lb (81.194 kg)    Physical Exam  Constitutional: She is oriented to person, place, and time. She appears well-developed and well-nourished.  HENT:  Head: Normocephalic and atraumatic.  Neck: Neck supple.  Cardiovascular: Normal rate and regular rhythm.   Pulmonary/Chest: Effort normal and breath sounds normal.  Abdominal: Soft. Bowel sounds are normal. She exhibits no mass. There is no hepatosplenomegaly. There is no tenderness.   Musculoskeletal: She exhibits no edema.  Lymphadenopathy:    She has no cervical adenopathy.  Neurological: She is alert and oriented to person, place, and time.  Skin: Skin is warm and dry.  Psychiatric: She has a normal mood and affect. Her behavior is normal.  Vitals reviewed.       Assessment & Plan:   Encounter Diagnoses  Name Primary?  . Coronary artery disease involving native coronary artery of native heart without angina pectoris Yes  . Congestive heart failure, unspecified congestive heart failure chronicity, unspecified congestive heart failure type ([place])   . Special screening for malignant neoplasm of colon   . Stented coronary artery   . Essential hypertension, benign   . Uncontrolled type 2 diabetes mellitus with complication, unspecified long term insulin use status ([place])   . Hyperlipidemia   . Obesity, unspecified   . Hypothyroidism, unspecified hypothyroidism type     -continue fish oil for now- polypharmacy and interactions prevent addition of trilipix.  However pt is encouraged to discuss with cardiologist when she goes  -Since pt has medicaid we will notify [facility].    -Renewed lisinopril (changed to 1 - 20mg  tab from 2 - 10mg  tabs). Increase metoprolol to 100mg  bid (can take 2 - 50mg  tabs bid since she just got 90 day supply)  -Refer to cardiology for chf and cad- gave [facility] application at 0000000 office visit but now found out that  pt has medicaid  -rx citalopram for anxiety.  Gave pt care for [facility] to call to get appointment for counseling  -Refer to GI for screening colonoscopy  -pt to go to diabetes education class  -F/u 1 month recheck bp

## 2015-06-26 ENCOUNTER — Encounter: Payer: Self-pay | Admitting: Physician Assistant

## 2015-06-26 ENCOUNTER — Encounter: Payer: Self-pay | Admitting: Cardiovascular Disease

## 2015-07-10 ENCOUNTER — Encounter (INDEPENDENT_AMBULATORY_CARE_PROVIDER_SITE_OTHER): Payer: Medicaid Other | Admitting: Ophthalmology

## 2015-07-13 ENCOUNTER — Encounter: Payer: Self-pay | Admitting: Cardiovascular Disease

## 2015-07-13 ENCOUNTER — Ambulatory Visit (INDEPENDENT_AMBULATORY_CARE_PROVIDER_SITE_OTHER): Payer: Medicaid Other | Admitting: Cardiovascular Disease

## 2015-07-13 VITALS — BP 166/100 | HR 95 | Ht 62.0 in | Wt 187.0 lb

## 2015-07-13 DIAGNOSIS — I5032 Chronic diastolic (congestive) heart failure: Secondary | ICD-10-CM

## 2015-07-13 DIAGNOSIS — I10 Essential (primary) hypertension: Secondary | ICD-10-CM | POA: Diagnosis not present

## 2015-07-13 DIAGNOSIS — Z01818 Encounter for other preprocedural examination: Secondary | ICD-10-CM

## 2015-07-13 DIAGNOSIS — Z955 Presence of coronary angioplasty implant and graft: Secondary | ICD-10-CM

## 2015-07-13 DIAGNOSIS — I2510 Atherosclerotic heart disease of native coronary artery without angina pectoris: Secondary | ICD-10-CM

## 2015-07-13 DIAGNOSIS — Z8673 Personal history of transient ischemic attack (TIA), and cerebral infarction without residual deficits: Secondary | ICD-10-CM

## 2015-07-13 DIAGNOSIS — E785 Hyperlipidemia, unspecified: Secondary | ICD-10-CM

## 2015-07-13 MED ORDER — LISINOPRIL 40 MG PO TABS: 40.0000 mg | ORAL_TABLET | Freq: Every day | ORAL | Status: DC

## 2015-07-13 MED ORDER — ATORVASTATIN CALCIUM 40 MG PO TABS: 40.0000 mg | ORAL_TABLET | Freq: Every day | ORAL | Status: DC

## 2015-07-13 NOTE — Progress Notes (Addendum)
Patient ID: Monica Carlson, female   DOB: [birth year], 50 y.o.   MRN: [id number]       CARDIOLOGY CONSULT NOTE  Patient ID: Monica Carlson MRN: [id number] DOB/AGE: 1965/03/14 50 y.o.  Admit date: (Not on file) Primary Physician: Soyla Dryer, PA-C Referring Physician:   Reason for Consultation: CAD  HPI: The patient is a 50 old woman with a past medical history significant for coronary artery disease, diabetes, hyperlipidemia, CVA's, and hypertension. She reportedly had a coronary artery stent placed in 2004. She does not know which artery and does not have a card. No cath report in system.  Lipids 05/22/15 total cholesterol 261, trig glycerides 747, HDL 30. LDL could not be calculated. On Lipitor 20 mg.  She reportedly was hospitalized for congestive heart failure in June 2016 at [facility]. Echocardiogram dated 07/31/14 showed normal left ventricular systolic function, EF [id number], moderate LVH, diastolic dysfunction, and mild mitral regurgitation.  Carotid Dopplers 10/06/14 did not show any significant stenosis.  She currently denies exertional chest pain, shortness of breath, leg swelling, palpitations, syncope, orthopnea, and paroxysmal nocturnal dyspnea. She is not on a diuretic.  Due to have eye surgery in near future.  ECG performed in the office today which I personally reviewed demonstrates normal sinus rhythm with no ischemic ST segment or T-wave abnormalities, nor any arrhythmias.   No Known Allergies  Current Outpatient Prescriptions  Medication Sig Dispense Refill  . acetaminophen (TYLENOL) 500 MG tablet Take 1,500 mg by mouth daily as needed for headache.    Marland Kitchen aspirin EC 325 MG tablet Take 325 mg by mouth daily.    Marland Kitchen atorvastatin (LIPITOR) 20 MG tablet Take 1 tablet (20 mg total) by mouth daily. 90 tablet 1  . Bisacodyl (LAXATIVE PO) Take 1 tablet by mouth as needed.    . citalopram (CELEXA) 20 MG tablet Take 1 tablet (20 mg total) by mouth daily. 30 tablet 0  . glimepiride  (AMARYL) 2 MG tablet Take 2 mg by mouth every morning. At 8 am    . Levothyroxine Sodium 25 MCG CAPS Take 0.5 capsules (12.5 mcg total) by mouth daily before breakfast. 45 capsule 1  . lisinopril (PRINIVIL,ZESTRIL) 20 MG tablet Take 1 tablet (20 mg total) by mouth daily. 30 tablet 1  . metFORMIN (GLUCOPHAGE) 1000 MG tablet Take 1 tablet (1,000 mg total) by mouth 2 (two) times daily with a meal. 180 tablet 1  . metoprolol (LOPRESSOR) 50 MG tablet Take 1 tablet (50 mg total) by mouth 2 (two) times daily. 180 tablet 1  . Multiple Vitamins-Minerals (MULTIVITAMIN WITH MINERALS) tablet Take 1 tablet by mouth daily.    . Multiple Vitamins-Minerals (VISION FORMULA/LUTEIN) TABS Take 1 tablet by mouth at bedtime.    . Omega-3 Fatty Acids (FISH OIL PO) Take 3 capsules by mouth.    . prednisoLONE acetate (PRED FORTE) 1 % ophthalmic suspension Place 1 drop into the right eye 4 (four) times daily.     No current facility-administered medications for this visit.    Past Medical History  Diagnosis Date  . Hypertension   . High cholesterol   . CHF (congestive heart failure) ([place]) 07-2014  . Diabetes mellitus without complication ([place]) [id number]  . MI (myocardial infarction) ([place])     2004  . Stroke [facility]) 09/2014, 2013    Past Surgical History  Procedure Laterality Date  . Coronary stent placement  2004  . Cholecystectomy  1992  . Tubal ligation      Social History  Social History  . Marital Status: Married    Spouse Name: N/A  . Number of Children: N/A  . Years of Education: N/A   Occupational History  . Not on file.   Social History Main Topics  . Smoking status: Never Smoker   . Smokeless tobacco: Never Used  . Alcohol Use: No  . Drug Use: No  . Sexual Activity: Yes    Birth Control/ Protection: None, Surgical   Other Topics Concern  . Not on file   Social History Narrative     No family history of premature CAD in 1st degree relatives.  Prior to Admission medications     Medication Sig Start Date End Date Taking? Authorizing Provider  acetaminophen (TYLENOL) 500 MG tablet Take 1,500 mg by mouth daily as needed for headache.   Yes Historical Provider, MD  aspirin EC 325 MG tablet Take 325 mg by mouth daily.   Yes Historical Provider, MD  atorvastatin (LIPITOR) 20 MG tablet Take 1 tablet (20 mg total) by mouth daily. 05/05/15  Yes Soyla Dryer, PA-C  Bisacodyl (LAXATIVE PO) Take 1 tablet by mouth as needed.   Yes Historical Provider, MD  citalopram (CELEXA) 20 MG tablet Take 1 tablet (20 mg total) by mouth daily. 06/25/15  Yes Soyla Dryer, PA-C  glimepiride (AMARYL) 2 MG tablet Take 2 mg by mouth every morning. At 8 am   Yes Historical Provider, MD  Levothyroxine Sodium 25 MCG CAPS Take 0.5 capsules (12.5 mcg total) by mouth daily before breakfast. 05/27/15  Yes Soyla Dryer, PA-C  lisinopril (PRINIVIL,ZESTRIL) 20 MG tablet Take 1 tablet (20 mg total) by mouth daily. 06/25/15  Yes Soyla Dryer, PA-C  metFORMIN (GLUCOPHAGE) 1000 MG tablet Take 1 tablet (1,000 mg total) by mouth 2 (two) times daily with a meal. 05/27/15  Yes Soyla Dryer, PA-C  metoprolol (LOPRESSOR) 50 MG tablet Take 1 tablet (50 mg total) by mouth 2 (two) times daily. 05/27/15  Yes Soyla Dryer, PA-C  Multiple Vitamins-Minerals (MULTIVITAMIN WITH MINERALS) tablet Take 1 tablet by mouth daily.   Yes Historical Provider, MD  Multiple Vitamins-Minerals (VISION FORMULA/LUTEIN) TABS Take 1 tablet by mouth at bedtime.   Yes Historical Provider, MD  Omega-3 Fatty Acids (FISH OIL PO) Take 3 capsules by mouth.   Yes Historical Provider, MD  prednisoLONE acetate (PRED FORTE) 1 % ophthalmic suspension Place 1 drop into the right eye 4 (four) times daily.   Yes Historical Provider, MD     Review of systems complete and found to be negative unless listed above in HPI     Physical exam Blood pressure 166/100, pulse 95, height 5\' 2"  (1.575 m), weight 187 lb (84.823 kg), SpO2 96 %. General:  NAD Neck: No JVD, no thyromegaly or thyroid nodule.  Lungs: Clear to auscultation bilaterally with normal respiratory effort. CV: Nondisplaced PMI. Regular rate and rhythm, normal S1/S2, no S3/S4, no murmur.  No peripheral edema.  No carotid bruit.   Abdomen: Soft, nontender, obese, no distention.  Skin: Intact without lesions or rashes.  Neurologic: Alert and oriented x 3.  Psych: Normal affect. Extremities: No clubbing or cyanosis.  HEENT: Normal.   ECG: Most recent ECG reviewed.  Labs:   Lab Results  Component Value Date   WBC 9.2 05/22/2015   HGB 13.1 05/22/2015   HCT 38.4 05/22/2015   MCV 92.3 05/22/2015   PLT 345 05/22/2015   No results for input(s): NA, K, CL, CO2, BUN, CREATININE, CALCIUM, PROT, BILITOT, ALKPHOS, ALT, AST, GLUCOSE  in the last 168 hours.  Invalid input(s): LABALBU No results found for: CKTOTAL, CKMB, CKMBINDEX, TROPONINI  Lab Results  Component Value Date   CHOL 261* 05/22/2015   Lab Results  Component Value Date   HDL 30* 05/22/2015   Lab Results  Component Value Date   LDLCALC NOT CALC 05/22/2015   Lab Results  Component Value Date   TRIG 747* 05/22/2015   Lab Results  Component Value Date   CHOLHDL 8.7* 05/22/2015   No results found for: LDLDIRECT       Studies: No results found.  ASSESSMENT AND PLAN:  1. CAD with stent: Symptomatically stable. Will try to obtain copy of 2004 cath report. Currently on ASA 325 mg (due to h/o CVA's), Lipitor 20 mg, lisinopril 20 mg, and metoprolol 50 mg twice daily. Will increase lisinopril to 40 mg daily for optimal blood pressure control. Will also increase Lipitor to 40 mg as she requires high intensity statin therapy based on her history of myocardial infarction and CAD. I will proceed with a nuclear myocardial perfusion imaging study to assess for ischemia (exercise Myoview) given upcoming eye surgery which may require general anesthesia. LV systolic function normal in 07/2014.  2. Essential HTN:  Markedly elevated on lisinopril 20 mg. Will increase to 40 mg.  3. Dyslipidemia: Will increase Lipitor to 40 mg as she would require high intensity statin therapy based on her h/o MI/CAD. Will repeat lipids in 3 months.  4. History of CVA's: Continue ASA 325 mg. Increase Lipitor to 40 mg.  5. Chronic diastolic heart failure: Euvolemic without diuretics. Will aim to control BP.  Dispo: fu 6 weeks.   Signed: Kate Sable, M.D., F.A.C.C.  07/13/2015, 2:54 PM  ADDENDUM: I reviewed cath report. She received a bare metal stent to the mid RCA on 11/22/02 by Dr. Sabino Snipes.

## 2015-07-13 NOTE — Addendum Note (Signed)
Addended by: Barbarann Ehlers A on: 07/13/2015 03:23 PM   Modules accepted: Orders, Medications

## 2015-07-13 NOTE — Patient Instructions (Signed)
Your physician recommends that you schedule a follow-up appointment in: 6 weeks Dr Bronson Ing   Your physician has requested that you have en exercise stress myoview. For further information please visit [email or web address]. Please follow instruction sheet, as given.     INCREASE Atorvastatin to 40 mg daily  INCREASE Lisinopril to 40 mg daily    Get FASTING Lipids in 3 months    Thank you for choosing [facility] !

## 2015-07-15 ENCOUNTER — Encounter (INDEPENDENT_AMBULATORY_CARE_PROVIDER_SITE_OTHER): Payer: Medicaid Other | Admitting: Ophthalmology

## 2015-07-16 ENCOUNTER — Encounter (HOSPITAL_COMMUNITY): Payer: Medicaid Other

## 2015-07-16 ENCOUNTER — Inpatient Hospital Stay (HOSPITAL_COMMUNITY): Admission: RE | Admit: 2015-07-16 | Payer: Medicaid Other | Source: Ambulatory Visit

## 2015-07-16 ENCOUNTER — Encounter (HOSPITAL_COMMUNITY): Admission: RE | Admit: 2015-07-16 | Payer: Medicaid Other | Source: Ambulatory Visit

## 2015-07-21 ENCOUNTER — Encounter (HOSPITAL_COMMUNITY): Payer: Medicaid Other

## 2015-07-21 ENCOUNTER — Encounter (HOSPITAL_COMMUNITY): Payer: Self-pay

## 2015-07-21 ENCOUNTER — Encounter (HOSPITAL_COMMUNITY)
Admission: RE | Admit: 2015-07-21 | Discharge: 2015-07-21 | Disposition: A | Discharge status: Home / Self Care | Payer: Medicaid Other | Source: Ambulatory Visit | Attending: Cardiovascular Disease | Admitting: Cardiovascular Disease

## 2015-07-21 DIAGNOSIS — I110 Hypertensive heart disease with heart failure: Secondary | ICD-10-CM | POA: Diagnosis present

## 2015-07-21 DIAGNOSIS — Z8673 Personal history of transient ischemic attack (TIA), and cerebral infarction without residual deficits: Secondary | ICD-10-CM | POA: Diagnosis not present

## 2015-07-21 DIAGNOSIS — Z955 Presence of coronary angioplasty implant and graft: Secondary | ICD-10-CM | POA: Insufficient documentation

## 2015-07-21 DIAGNOSIS — I10 Essential (primary) hypertension: Secondary | ICD-10-CM

## 2015-07-21 DIAGNOSIS — I5032 Chronic diastolic (congestive) heart failure: Secondary | ICD-10-CM | POA: Diagnosis not present

## 2015-07-21 DIAGNOSIS — E785 Hyperlipidemia, unspecified: Secondary | ICD-10-CM | POA: Diagnosis not present

## 2015-07-21 DIAGNOSIS — Z01818 Encounter for other preprocedural examination: Secondary | ICD-10-CM | POA: Insufficient documentation

## 2015-07-21 DIAGNOSIS — I2510 Atherosclerotic heart disease of native coronary artery without angina pectoris: Secondary | ICD-10-CM | POA: Diagnosis not present

## 2015-07-21 LAB — NM MYOCAR MULTI W/SPECT W/WALL MOTION / EF
CHL CUP NUCLEAR SSS: 4
CHL CUP RESTING HR STRESS: 57 {beats}/min
CHL RATE OF PERCEIVED EXERTION: 15
CSEPED: 2 min
CSEPHR: 58 %
CSEPPHR: 99 {beats}/min
Estimated workload: 4.6 METS
Exercise duration (sec): 16 s
LHR: 0
LVDIAVOL: 103 mL (ref 46–106)
LVSYSVOL: 48 mL
MPHR: 170 {beats}/min
NUC STRESS TID: 1.14
SDS: 3
SRS: 1

## 2015-07-21 MED ORDER — REGADENOSON 0.4 MG/5ML IV SOLN
INTRAVENOUS | Status: AC
  Administered 2015-07-21: 0.4 mg via INTRAVENOUS
  Filled 2015-07-21: qty 5

## 2015-07-21 MED ORDER — TECHNETIUM TC 99M TETROFOSMIN IV KIT: 30.0000 | PACK | Freq: Once | INTRAVENOUS | Status: DC | PRN

## 2015-07-21 MED ORDER — SODIUM CHLORIDE 0.9% FLUSH
INTRAVENOUS | Status: AC
  Administered 2015-07-21: 10 mL via INTRAVENOUS
  Filled 2015-07-21: qty 10

## 2015-07-21 MED ORDER — TECHNETIUM TC 99M TETROFOSMIN IV KIT: 10.0000 | PACK | Freq: Once | INTRAVENOUS | Status: DC | PRN

## 2015-07-28 ENCOUNTER — Ambulatory Visit: Payer: Medicaid Other | Admitting: Physician Assistant

## 2015-07-30 ENCOUNTER — Encounter (INDEPENDENT_AMBULATORY_CARE_PROVIDER_SITE_OTHER): Payer: Medicaid Other | Admitting: Ophthalmology

## 2015-08-12 ENCOUNTER — Encounter (INDEPENDENT_AMBULATORY_CARE_PROVIDER_SITE_OTHER): Payer: Medicaid Other | Admitting: Ophthalmology

## 2015-08-20 ENCOUNTER — Encounter (INDEPENDENT_AMBULATORY_CARE_PROVIDER_SITE_OTHER): Payer: Medicaid Other | Admitting: Ophthalmology

## 2015-08-24 ENCOUNTER — Encounter: Payer: Self-pay | Admitting: Cardiovascular Disease

## 2015-08-24 ENCOUNTER — Ambulatory Visit: Payer: Medicaid Other | Admitting: Cardiovascular Disease

## 2015-08-26 ENCOUNTER — Encounter: Payer: Self-pay | Admitting: Physician Assistant

## 2015-09-16 ENCOUNTER — Encounter: Payer: Self-pay | Admitting: Physician Assistant

## 2015-09-16 ENCOUNTER — Ambulatory Visit: Payer: Medicaid Other | Admitting: Physician Assistant

## 2015-09-16 VITALS — BP 180/104 | HR 74 | Temp 97.5°F | Ht 62.0 in | Wt 188.0 lb

## 2015-09-16 DIAGNOSIS — E118 Type 2 diabetes mellitus with unspecified complications: Secondary | ICD-10-CM

## 2015-09-16 DIAGNOSIS — E785 Hyperlipidemia, unspecified: Secondary | ICD-10-CM

## 2015-09-16 DIAGNOSIS — I2510 Atherosclerotic heart disease of native coronary artery without angina pectoris: Secondary | ICD-10-CM

## 2015-09-16 DIAGNOSIS — E1165 Type 2 diabetes mellitus with hyperglycemia: Secondary | ICD-10-CM

## 2015-09-16 DIAGNOSIS — I10 Essential (primary) hypertension: Secondary | ICD-10-CM

## 2015-09-16 DIAGNOSIS — E669 Obesity, unspecified: Secondary | ICD-10-CM

## 2015-09-16 DIAGNOSIS — Z1239 Encounter for other screening for malignant neoplasm of breast: Secondary | ICD-10-CM

## 2015-09-16 DIAGNOSIS — Z9119 Patient's noncompliance with other medical treatment and regimen: Secondary | ICD-10-CM

## 2015-09-16 DIAGNOSIS — E039 Hypothyroidism, unspecified: Secondary | ICD-10-CM

## 2015-09-16 DIAGNOSIS — Z91199 Patient's noncompliance with other medical treatment and regimen due to unspecified reason: Secondary | ICD-10-CM

## 2015-09-16 NOTE — Progress Notes (Signed)
BP (!) 180/104 (BP Location: Left Arm, Patient Position: Sitting, Cuff Size: Normal)   Pulse 74   Temp 97.5 F (36.4 C) (Other (Comment))   Ht 5\' 2"  (1.575 m)   Wt 188 lb (85.3 kg)   SpO2 97%   BMI 34.39 kg/m    Subjective:    Patient ID: Monica Carlson, female    DOB: 15-May-1965, 50 y.o.   MRN: [id number]  HPI: Monica Carlson is a 50 y.o. female presenting on 09/16/2015 for Hypertension (forgot to take bp meds the last 2 days, has been stressed)   HPI -pt states her Mood is doing well on citalopram-  She is also taking prozac that she didn't bring with her to her last OV when the citalopram was started.  Discussed and she wants to continue the citalopram and stop the prozac  -Pt might be taking 20mg  lisinopril still- she never got the 40mg  that dr Bronson Ing sent to [place].  I tend to think that the patient is not really taking this either however because the rx was filled on 06/25/15 and she still has some left.  She also still has packages of lisinopril 10mg  that was prescribed she says by a doctor when she stayed in the hospital last summer.  This rx was filled 06/22/15 and she has some left.    -pt doesn't have the 40mg  Lipitor that dr Bronson Ing wrote for her either.   -Pt says she is only taking 1/2 of metoprolol bid.  She was supposed to be taking 2 bid  -Spent 20 minutes going through pt meds and still don't really know what she is taking.  This is after my nurse spent 30 minutes going through her meds with the patient and was equally uncertain.  -Pt says she went to the diabetes education class but she doesn't remember when and she didn't bring note stating that she attended (all of our patients are given certificate to return to [place] so I am not inclined to think that she went).  -Pt was scheduled for mammogram in April but she cancelled the appointment and didn't reschedule  -Pt was sent a letter 06/26/15 by GI to call their office for colonoscopy. She says she never got it  -again  as with previous visits, pt is a terrible historian and talking with her does not seem to be helpful to find out what is going on with her  Relevant past medical, surgical, family and social history reviewed and updated as indicated. Interim medical history since our last visit reviewed. Allergies and medications reviewed and updated.   Current Outpatient Prescriptions:  .  aspirin EC 325 MG tablet, Take 325 mg by mouth daily., Disp: , Rfl:  .  Bisacodyl (LAXATIVE PO), Take 1 tablet by mouth as needed., Disp: , Rfl:  .  citalopram (CELEXA) 20 MG tablet, Take 1 tablet (20 mg total) by mouth daily., Disp: 30 tablet, Rfl: 0 .  FLUoxetine (PROZAC) 20 MG capsule, Take 20 mg by mouth daily., Disp: , Rfl:  .  Levothyroxine Sodium 25 MCG CAPS, Take 0.5 capsules (12.5 mcg total) by mouth daily before breakfast., Disp: 45 capsule, Rfl: 1 .  metFORMIN (GLUCOPHAGE) 1000 MG tablet, Take 1 tablet (1,000 mg total) by mouth 2 (two) times daily with a meal., Disp: 180 tablet, Rfl: 1 .  Multiple Vitamins-Minerals (VISION FORMULA/LUTEIN) TABS, Take 1 tablet by mouth at bedtime., Disp: , Rfl:  .  niacin 500 MG tablet, Take 500 mg by mouth daily.,  Disp: , Rfl:  .  atorvastatin (LIPITOR) 40 MG tablet, Take 1 tablet (40 mg total) by mouth daily. (Patient not taking: Reported on 09/16/2015), Disp: 90 tablet, Rfl: 3 .  glimepiride (AMARYL) 2 MG tablet, Take 2 mg by mouth every morning. At 8 am, Disp: , Rfl:  .  lisinopril (PRINIVIL,ZESTRIL) 40 MG tablet, Take 1 tablet (40 mg total) by mouth daily. (Patient not taking: Reported on 09/16/2015), Disp: 90 tablet, Rfl: 3 .  metoprolol (LOPRESSOR) 50 MG tablet, Take 1 tablet (50 mg total) by mouth 2 (two) times daily. (Patient not taking: Reported on 09/16/2015), Disp: 180 tablet, Rfl: 1 .  Multiple Vitamins-Minerals (MULTIVITAMIN WITH MINERALS) tablet, Take 1 tablet by mouth daily., Disp: , Rfl:    Review of Systems  Per HPI unless specifically indicated above      Objective:    BP (!) 180/104 (BP Location: Left Arm, Patient Position: Sitting, Cuff Size: Normal)   Pulse 74   Temp 97.5 F (36.4 C) (Other (Comment))   Ht 5\' 2"  (1.575 m)   Wt 188 lb (85.3 kg)   SpO2 97%   BMI 34.39 kg/m   Wt Readings from Last 3 Encounters:  09/16/15 188 lb (85.3 kg)  07/13/15 187 lb (84.8 kg)  06/25/15 190 lb (86.2 kg)    Physical Exam  Constitutional: She is oriented to person, place, and time. She appears well-developed and well-nourished.  HENT:  Head: Normocephalic and atraumatic.  Neck: Neck supple.  Cardiovascular: Normal rate and regular rhythm.   Pulmonary/Chest: Effort normal and breath sounds normal.  Abdominal: Soft. Bowel sounds are normal. She exhibits no mass. There is no hepatosplenomegaly. There is no tenderness.  Musculoskeletal: She exhibits no edema.  Lymphadenopathy:    She has no cervical adenopathy.  Neurological: She is alert and oriented to person, place, and time.  Skin: Skin is warm and dry.  Psychiatric: She has a normal mood and affect. Her behavior is normal.  Vitals reviewed.       Assessment & Plan:   Encounter Diagnoses  Name Primary?  . Personal history of noncompliance with medical treatment, presenting hazards to health Yes  . Uncontrolled type 2 diabetes mellitus with complication, unspecified long term insulin use status ([place])   . Essential hypertension, benign   . Coronary artery disease involving native coronary artery of native heart without angina pectoris   . Hypothyroidism, unspecified hypothyroidism type   . Obesity, unspecified   . Hyperlipidemia     Encounter Diagnoses  Name Primary?  . Personal history of noncompliance with medical treatment, presenting hazards to health Yes  . Uncontrolled type 2 diabetes mellitus with complication, unspecified long term insulin use status ([place])   . Essential hypertension, benign   . Coronary artery disease involving native coronary artery of native heart  without angina pectoris   . Hypothyroidism, unspecified hypothyroidism type   . Obesity, unspecified   . Hyperlipidemia   . Screening for breast cancer     -will reorder mammogram -Referred pt to [place] to try to get pt help with taking the proper medications.  Discussed with pt to refer to the list she gets when she leaves today ([facility]) to see what medications she is supposed to be taking.  Urged her to take the proper bp meds to reduce chances for another stroke or a heart attack -Reprinted GI letter and recommended she call to get scheduled for colonocopy -F/u 6 wk with labs before appt (Labs- a1c,  tsh (no lipids- ordered by Dr Raliegh Ip) (spent 40 minutes with pt over half of which was with counseling and education)

## 2015-09-22 ENCOUNTER — Other Ambulatory Visit: Payer: Self-pay | Admitting: Physician Assistant

## 2015-09-22 DIAGNOSIS — Z1231 Encounter for screening mammogram for malignant neoplasm of breast: Secondary | ICD-10-CM

## 2015-09-23 ENCOUNTER — Other Ambulatory Visit: Payer: Self-pay | Admitting: Physician Assistant

## 2015-09-23 DIAGNOSIS — E785 Hyperlipidemia, unspecified: Secondary | ICD-10-CM

## 2015-09-23 DIAGNOSIS — E1165 Type 2 diabetes mellitus with hyperglycemia: Secondary | ICD-10-CM

## 2015-09-23 DIAGNOSIS — E118 Type 2 diabetes mellitus with unspecified complications: Secondary | ICD-10-CM

## 2015-09-23 DIAGNOSIS — I10 Essential (primary) hypertension: Secondary | ICD-10-CM

## 2015-09-23 DIAGNOSIS — E039 Hypothyroidism, unspecified: Secondary | ICD-10-CM

## 2015-09-24 ENCOUNTER — Other Ambulatory Visit: Payer: Self-pay | Admitting: Physician Assistant

## 2015-10-07 ENCOUNTER — Ambulatory Visit (HOSPITAL_COMMUNITY): Payer: Medicaid Other

## 2015-10-20 ENCOUNTER — Other Ambulatory Visit: Payer: Self-pay

## 2015-10-20 DIAGNOSIS — E039 Hypothyroidism, unspecified: Secondary | ICD-10-CM

## 2015-10-20 DIAGNOSIS — E118 Type 2 diabetes mellitus with unspecified complications: Principal | ICD-10-CM

## 2015-10-20 DIAGNOSIS — E785 Hyperlipidemia, unspecified: Secondary | ICD-10-CM

## 2015-10-20 DIAGNOSIS — I10 Essential (primary) hypertension: Secondary | ICD-10-CM

## 2015-10-20 DIAGNOSIS — E1165 Type 2 diabetes mellitus with hyperglycemia: Secondary | ICD-10-CM

## 2015-10-23 ENCOUNTER — Other Ambulatory Visit: Payer: Self-pay | Admitting: Physician Assistant

## 2015-10-23 LAB — LIPID PANEL
CHOL/HDL RATIO: 10.3 ratio — AB (ref <=5.0)
Cholesterol: 351 mg/dL — ABNORMAL HIGH (ref 125–200)
HDL: 34 mg/dL — ABNORMAL LOW (ref >=46)
Triglycerides: 1098 mg/dL — ABNORMAL HIGH (ref <150)

## 2015-10-24 LAB — HEMOGLOBIN A1C
HEMOGLOBIN A1C: 12.7 % — AB (ref <5.7)
MEAN PLASMA GLUCOSE: 318 mg/dL

## 2015-10-24 LAB — COMPLETE METABOLIC PANEL WITH GFR
ALBUMIN: 3.8 g/dL (ref 3.6–5.1)
ALT: 13 U/L (ref 6–29)
AST: 16 U/L (ref 10–35)
Alkaline Phosphatase: 97 U/L (ref 33–130)
BUN: 10 mg/dL (ref 7–25)
CHLORIDE: 95 mmol/L — AB (ref 98–110)
CO2: 24 mmol/L (ref 20–31)
Calcium: 9.2 mg/dL (ref 8.6–10.4)
Creat: 0.7 mg/dL (ref 0.50–1.05)
GFR, Est African American: >89 mL/min (ref >=60)
GFR, Est Non African American: >89 mL/min (ref >=60)
GLUCOSE: 361 mg/dL — AB (ref 65–99)
POTASSIUM: 4.2 mmol/L (ref 3.5–5.3)
SODIUM: 134 mmol/L — AB (ref 135–146)
Total Bilirubin: 0.5 mg/dL (ref 0.2–1.2)
Total Protein: 7.1 g/dL (ref 6.1–8.1)

## 2015-10-24 LAB — TSH: TSH: 9.64 mIU/L — ABNORMAL HIGH

## 2015-10-28 ENCOUNTER — Ambulatory Visit: Payer: Medicaid Other | Admitting: Physician Assistant

## 2015-11-09 ENCOUNTER — Encounter: Payer: Self-pay | Admitting: Physician Assistant

## 2015-11-18 ENCOUNTER — Other Ambulatory Visit: Payer: Self-pay | Admitting: Physician Assistant

## 2015-11-19 ENCOUNTER — Other Ambulatory Visit: Payer: Self-pay | Admitting: Physician Assistant

## 2015-11-20 ENCOUNTER — Other Ambulatory Visit: Payer: Self-pay | Admitting: Physician Assistant

## 2015-12-23 ENCOUNTER — Ambulatory Visit: Payer: Medicaid Other | Admitting: Physician Assistant

## 2015-12-24 ENCOUNTER — Ambulatory Visit: Payer: Medicaid Other | Admitting: Physician Assistant

## 2015-12-24 ENCOUNTER — Encounter: Payer: Self-pay | Admitting: Physician Assistant

## 2015-12-24 VITALS — BP 184/110 | HR 81 | Temp 97.7°F | Ht 62.0 in | Wt 184.0 lb

## 2015-12-24 DIAGNOSIS — Z6833 Body mass index (BMI) 33.0-33.9, adult: Secondary | ICD-10-CM

## 2015-12-24 DIAGNOSIS — E118 Type 2 diabetes mellitus with unspecified complications: Secondary | ICD-10-CM

## 2015-12-24 DIAGNOSIS — E669 Obesity, unspecified: Secondary | ICD-10-CM

## 2015-12-24 DIAGNOSIS — I10 Essential (primary) hypertension: Secondary | ICD-10-CM

## 2015-12-24 DIAGNOSIS — Z91199 Patient's noncompliance with other medical treatment and regimen due to unspecified reason: Secondary | ICD-10-CM

## 2015-12-24 DIAGNOSIS — E785 Hyperlipidemia, unspecified: Secondary | ICD-10-CM

## 2015-12-24 DIAGNOSIS — I2510 Atherosclerotic heart disease of native coronary artery without angina pectoris: Secondary | ICD-10-CM

## 2015-12-24 DIAGNOSIS — E039 Hypothyroidism, unspecified: Secondary | ICD-10-CM

## 2015-12-24 DIAGNOSIS — E1165 Type 2 diabetes mellitus with hyperglycemia: Secondary | ICD-10-CM

## 2015-12-24 DIAGNOSIS — Z9119 Patient's noncompliance with other medical treatment and regimen: Secondary | ICD-10-CM

## 2015-12-24 MED ORDER — FENOFIBRATE 145 MG PO TABS: 145.0000 mg | ORAL_TABLET | Freq: Every day | ORAL | 3 refills | Status: DC

## 2015-12-24 MED ORDER — NITROGLYCERIN 0.4 MG SL SUBL: 0.4000 mg | SUBLINGUAL_TABLET | SUBLINGUAL | 4 refills | Status: DC | PRN

## 2015-12-24 MED ORDER — GLIMEPIRIDE 4 MG PO TABS: 4.0000 mg | ORAL_TABLET | Freq: Every day | ORAL | 2 refills | Status: DC

## 2015-12-24 MED ORDER — ATORVASTATIN CALCIUM 20 MG PO TABS: 20.0000 mg | ORAL_TABLET | Freq: Every day | ORAL | 1 refills | Status: DC

## 2015-12-24 NOTE — Progress Notes (Signed)
BP (!) 184/110 (BP Location: Left Arm, Patient Position: Sitting, Cuff Size: Normal)   Pulse 81   Temp 97.7 F (36.5 C) (Other (Comment))   Ht 5\' 2"  (1.575 m)   Wt 184 lb (83.5 kg)   SpO2 97%   BMI 33.65 kg/m    Subjective:    Patient ID: Monica Carlson, female    DOB: [birth year], 50 y.o.   MRN: [id number]  HPI: Monica Carlson is a 50 y.o. female presenting on 12/24/2015 for No chief complaint on file.   HPI   Pt not been in since July. She has had several No-shows and cancelllations. She hasn't been to dm education class yet.  She occasionally checks her bs at home and it's "high".  Last she checked it was 300. that was "a couple weeks ago"  Pt doesn't know what happened to the glimiperide she used to take.  Pt says she misses doses sometimes of her meds, specifically her bp meds.   She doesn't know if her diet has changed- a1c in march was 7.8 and now 12.7.  Pt didn't get f/u with GI as discussed at last OV  Pt is difficult historian.  She does say that she doesn't know what meds she is supposed to take once or twice daily because she can't see the labels. She says she doesn't take any of her medications regularly.  Relevant past medical, surgical, family and social history reviewed and updated as indicated. Interim medical history since our last visit reviewed. Allergies and medications reviewed and updated.   Current Outpatient Prescriptions:  .  ACCU-CHEK AVIVA PLUS test strip, TEST BLOOD SUGAR ONCE DAILY AS NEEDED, Disp: 50 each, Rfl: 1 .  ACCU-CHEK SOFTCLIX LANCETS lancets, TEST BLOOD SUGAR ONCE DAILY AS NEEDED, Disp: 100 each, Rfl: 1 .  aspirin EC 325 MG tablet, Take 325 mg by mouth daily., Disp: , Rfl:  .  atorvastatin (LIPITOR) 40 MG tablet, Take 1 tablet (40 mg total) by mouth daily., Disp: 90 tablet, Rfl: 3 .  Bisacodyl (LAXATIVE PO), Take 1 tablet by mouth as needed., Disp: , Rfl:  .  citalopram (CELEXA) 20 MG tablet, Take 1 tablet (20 mg total) by mouth daily., Disp:  30 tablet, Rfl: 0 .  Levothyroxine Sodium 25 MCG CAPS, Take 0.5 capsules (12.5 mcg total) by mouth daily before breakfast., Disp: 45 capsule, Rfl: 1 .  lisinopril (PRINIVIL,ZESTRIL) 40 MG tablet, Take 1 tablet (40 mg total) by mouth daily., Disp: 90 tablet, Rfl: 3 .  metFORMIN (GLUCOPHAGE) 1000 MG tablet, Take 1 tablet (1,000 mg total) by mouth 2 (two) times daily with a meal., Disp: 180 tablet, Rfl: 1 .  metoprolol (LOPRESSOR) 50 MG tablet, Take 1 tablet (50 mg total) by mouth 2 (two) times daily., Disp: 180 tablet, Rfl: 1 .  Multiple Vitamins-Minerals (VISION FORMULA/LUTEIN) TABS, Take 1 tablet by mouth at bedtime., Disp: , Rfl:  .  niacin 500 MG tablet, Take 500 mg by mouth daily., Disp: , Rfl:  .  nitroGLYCERIN (NITROSTAT) 0.4 MG SL tablet, Place 0.4 mg under the tongue every 5 (five) minutes as needed for chest pain., Disp: , Rfl:  .  Multiple Vitamins-Minerals (MULTIVITAMIN WITH MINERALS) tablet, Take 1 tablet by mouth daily., Disp: , Rfl:    Review of Systems  Constitutional: Positive for fatigue.  Respiratory: Negative for shortness of breath.   Cardiovascular: Negative for chest pain.  Gastrointestinal: Negative for abdominal pain.  Psychiatric/Behavioral: Negative for suicidal ideas.    Per HPI  unless specifically indicated above     Objective:    BP (!) 184/110 (BP Location: Left Arm, Patient Position: Sitting, Cuff Size: Normal)   Pulse 81   Temp 97.7 F (36.5 C) (Other (Comment))   Ht 5\' 2"  (1.575 m)   Wt 184 lb (83.5 kg)   SpO2 97%   BMI 33.65 kg/m   Wt Readings from Last 3 Encounters:  12/24/15 184 lb (83.5 kg)  09/16/15 188 lb (85.3 kg)  07/13/15 187 lb (84.8 kg)    Physical Exam  Constitutional: She is oriented to person, place, and time. She appears well-developed and well-nourished.  HENT:  Head: Normocephalic and atraumatic.  Neck: Neck supple.  Cardiovascular: Normal rate and regular rhythm.   Pulmonary/Chest: Effort normal and breath sounds normal.   Abdominal: Soft. Bowel sounds are normal. She exhibits no mass. There is no hepatosplenomegaly. There is no tenderness.  Musculoskeletal: She exhibits no edema.  Lymphadenopathy:    She has no cervical adenopathy.  Neurological: She is alert and oriented to person, place, and time.  Skin: Skin is warm and dry.  Psychiatric: She has a normal mood and affect. Her behavior is normal.  Vitals reviewed.   Results for orders placed or performed in visit on 10/20/15  HgB A1c  Result Value Ref Range   Hgb A1c MFr Bld 12.7 (H) <5.7 %   Mean Plasma Glucose 318 mg/dL  COMPLETE METABOLIC PANEL WITH GFR  Result Value Ref Range   Sodium 134 (L) 135 - 146 mmol/L   Potassium 4.2 3.5 - 5.3 mmol/L   Chloride 95 (L) 98 - 110 mmol/L   CO2 24 20 - 31 mmol/L   Glucose, Bld 361 (H) 65 - 99 mg/dL   BUN 10 7 - 25 mg/dL   Creat 0.70 0.50 - 1.05 mg/dL   Total Bilirubin 0.5 0.2 - 1.2 mg/dL   Alkaline Phosphatase 97 33 - 130 U/L   AST 16 10 - 35 U/L   ALT 13 6 - 29 U/L   Total Protein 7.1 6.1 - 8.1 g/dL   Albumin 3.8 3.6 - 5.1 g/dL   Calcium 9.2 8.6 - 10.4 mg/dL   GFR, Est African American >89 >=60 mL/min   GFR, Est Non African American >89 >=60 mL/min  TSH  Result Value Ref Range   TSH 9.64 (H) mIU/L  Lipid Profile  Result Value Ref Range   Cholesterol 351 (H) 125 - 200 mg/dL   Triglycerides 1,098 (H) <150 mg/dL   HDL 34 (L) >=46 mg/dL   Total CHOL/HDL Ratio 10.3 (H) <=5.0 Ratio   VLDL NOT CALC <30 mg/dL   LDL Cholesterol NOT CALC <130 mg/dL      Assessment & Plan:    Encounter Diagnoses  Name Primary?  . Personal history of noncompliance with medical treatment, presenting hazards to health Yes  . Uncontrolled type 2 diabetes mellitus with complication, unspecified long term insulin use status ([place])   . Essential hypertension, benign   . Coronary artery disease involving native coronary artery of native heart without angina pectoris   . Hyperlipidemia, unspecified hyperlipidemia type    . Hypothyroidism, unspecified type   . Class 1 obesity with body mass index (BMI) of 33.0 to 33.9 in adult, unspecified obesity type, unspecified whether serious comorbidity present     -pt counseled to get provider name on medicaid card changed  -had nurse Call pharmacy- they said there really isn't anything they can do to help with the fact that  she can't see her meds.  Today in the office, her Bottle tops are color coded for the patient which she can see and she is instructed and given paper with extra large letters indicating frequency.  (all meds are either QD or BID)  -Restart glimiperide. Watch diet.  -No additions to bp meds- just needs to take what she has  -Add tricor for triglycerides and reduce atorvastatin to minimize effects.   -Pt to call to get appoinment with eye dr  -discussed with pt the importance of coming to her appointments so her health can be optimized.  She states understanding  -follow up in one month.  RTO sooner prn  (The duration of this appointment visit was 35 minutes of face-to-face time with the patient.  Greater than 50% of this time was spent in counseling, explanation of diagnosis, planning of further management, and coordination of care.)

## 2015-12-26 DIAGNOSIS — Z9119 Patient's noncompliance with other medical treatment and regimen: Secondary | ICD-10-CM | POA: Insufficient documentation

## 2015-12-26 DIAGNOSIS — Z91199 Patient's noncompliance with other medical treatment and regimen due to unspecified reason: Secondary | ICD-10-CM | POA: Insufficient documentation

## 2015-12-28 ENCOUNTER — Other Ambulatory Visit: Payer: Self-pay | Admitting: Physician Assistant

## 2016-01-26 ENCOUNTER — Other Ambulatory Visit: Payer: Self-pay | Admitting: Physician Assistant

## 2016-01-26 ENCOUNTER — Ambulatory Visit: Payer: Medicaid Other | Admitting: Physician Assistant

## 2016-02-23 ENCOUNTER — Other Ambulatory Visit: Payer: Self-pay | Admitting: Physician Assistant

## 2016-02-24 ENCOUNTER — Other Ambulatory Visit: Payer: Self-pay | Admitting: Physician Assistant

## 2016-02-29 ENCOUNTER — Ambulatory Visit: Payer: Medicaid Other | Admitting: Physician Assistant

## 2016-03-29 ENCOUNTER — Other Ambulatory Visit: Payer: Self-pay | Admitting: Physician Assistant

## 2016-04-26 ENCOUNTER — Other Ambulatory Visit: Payer: Self-pay | Admitting: Physician Assistant

## 2016-05-18 ENCOUNTER — Other Ambulatory Visit: Payer: Self-pay | Admitting: Physician Assistant

## 2016-05-24 ENCOUNTER — Other Ambulatory Visit: Payer: Self-pay | Admitting: Physician Assistant

## 2016-07-28 ENCOUNTER — Other Ambulatory Visit: Payer: Self-pay | Admitting: Physician Assistant

## 2016-10-07 ENCOUNTER — Inpatient Hospital Stay (HOSPITAL_COMMUNITY)
Admission: EM | Admit: 2016-10-07 | Discharge: 2016-10-14 | DRG: 291 | Disposition: A | Discharge status: Home Health | Payer: Medicaid Other | Attending: Internal Medicine | Admitting: Internal Medicine

## 2016-10-07 ENCOUNTER — Emergency Department (HOSPITAL_COMMUNITY): Payer: Medicaid Other

## 2016-10-07 ENCOUNTER — Inpatient Hospital Stay (HOSPITAL_COMMUNITY): Payer: Medicaid Other

## 2016-10-07 ENCOUNTER — Encounter (HOSPITAL_COMMUNITY): Payer: Self-pay

## 2016-10-07 DIAGNOSIS — I5043 Acute on chronic combined systolic (congestive) and diastolic (congestive) heart failure: Secondary | ICD-10-CM | POA: Diagnosis present

## 2016-10-07 DIAGNOSIS — I361 Nonrheumatic tricuspid (valve) insufficiency: Secondary | ICD-10-CM | POA: Diagnosis not present

## 2016-10-07 DIAGNOSIS — I5041 Acute combined systolic (congestive) and diastolic (congestive) heart failure: Secondary | ICD-10-CM | POA: Diagnosis not present

## 2016-10-07 DIAGNOSIS — I255 Ischemic cardiomyopathy: Secondary | ICD-10-CM | POA: Diagnosis present

## 2016-10-07 DIAGNOSIS — I4581 Long QT syndrome: Secondary | ICD-10-CM | POA: Diagnosis present

## 2016-10-07 DIAGNOSIS — Z7982 Long term (current) use of aspirin: Secondary | ICD-10-CM

## 2016-10-07 DIAGNOSIS — I10 Essential (primary) hypertension: Secondary | ICD-10-CM | POA: Diagnosis present

## 2016-10-07 DIAGNOSIS — I429 Cardiomyopathy, unspecified: Secondary | ICD-10-CM | POA: Diagnosis not present

## 2016-10-07 DIAGNOSIS — Z79899 Other long term (current) drug therapy: Secondary | ICD-10-CM | POA: Diagnosis not present

## 2016-10-07 DIAGNOSIS — I340 Nonrheumatic mitral (valve) insufficiency: Secondary | ICD-10-CM | POA: Diagnosis not present

## 2016-10-07 DIAGNOSIS — E1165 Type 2 diabetes mellitus with hyperglycemia: Secondary | ICD-10-CM | POA: Diagnosis present

## 2016-10-07 DIAGNOSIS — I110 Hypertensive heart disease with heart failure: Secondary | ICD-10-CM | POA: Diagnosis present

## 2016-10-07 DIAGNOSIS — I5033 Acute on chronic diastolic (congestive) heart failure: Secondary | ICD-10-CM | POA: Diagnosis present

## 2016-10-07 DIAGNOSIS — E785 Hyperlipidemia, unspecified: Secondary | ICD-10-CM | POA: Diagnosis present

## 2016-10-07 DIAGNOSIS — R1310 Dysphagia, unspecified: Secondary | ICD-10-CM | POA: Diagnosis present

## 2016-10-07 DIAGNOSIS — I6523 Occlusion and stenosis of bilateral carotid arteries: Secondary | ICD-10-CM | POA: Diagnosis present

## 2016-10-07 DIAGNOSIS — Z7984 Long term (current) use of oral hypoglycemic drugs: Secondary | ICD-10-CM | POA: Diagnosis not present

## 2016-10-07 DIAGNOSIS — E669 Obesity, unspecified: Secondary | ICD-10-CM | POA: Diagnosis present

## 2016-10-07 DIAGNOSIS — N179 Acute kidney failure, unspecified: Secondary | ICD-10-CM | POA: Diagnosis present

## 2016-10-07 DIAGNOSIS — I63511 Cerebral infarction due to unspecified occlusion or stenosis of right middle cerebral artery: Secondary | ICD-10-CM | POA: Diagnosis present

## 2016-10-07 DIAGNOSIS — E039 Hypothyroidism, unspecified: Secondary | ICD-10-CM | POA: Diagnosis present

## 2016-10-07 DIAGNOSIS — Z6841 Body Mass Index (BMI) 40.0 and over, adult: Secondary | ICD-10-CM

## 2016-10-07 DIAGNOSIS — I5189 Other ill-defined heart diseases: Secondary | ICD-10-CM | POA: Diagnosis present

## 2016-10-07 DIAGNOSIS — I639 Cerebral infarction, unspecified: Secondary | ICD-10-CM | POA: Diagnosis present

## 2016-10-07 DIAGNOSIS — E784 Other hyperlipidemia: Secondary | ICD-10-CM | POA: Diagnosis not present

## 2016-10-07 DIAGNOSIS — I509 Heart failure, unspecified: Secondary | ICD-10-CM | POA: Diagnosis not present

## 2016-10-07 DIAGNOSIS — E118 Type 2 diabetes mellitus with unspecified complications: Secondary | ICD-10-CM | POA: Diagnosis not present

## 2016-10-07 DIAGNOSIS — E782 Mixed hyperlipidemia: Secondary | ICD-10-CM | POA: Diagnosis present

## 2016-10-07 DIAGNOSIS — I071 Rheumatic tricuspid insufficiency: Secondary | ICD-10-CM | POA: Diagnosis present

## 2016-10-07 DIAGNOSIS — I5021 Acute systolic (congestive) heart failure: Secondary | ICD-10-CM | POA: Diagnosis not present

## 2016-10-07 DIAGNOSIS — R29704 NIHSS score 4: Secondary | ICD-10-CM | POA: Diagnosis present

## 2016-10-07 DIAGNOSIS — Z8249 Family history of ischemic heart disease and other diseases of the circulatory system: Secondary | ICD-10-CM

## 2016-10-07 DIAGNOSIS — Z9189 Other specified personal risk factors, not elsewhere classified: Secondary | ICD-10-CM

## 2016-10-07 DIAGNOSIS — I2510 Atherosclerotic heart disease of native coronary artery without angina pectoris: Secondary | ICD-10-CM | POA: Diagnosis present

## 2016-10-07 DIAGNOSIS — I25118 Atherosclerotic heart disease of native coronary artery with other forms of angina pectoris: Secondary | ICD-10-CM | POA: Diagnosis not present

## 2016-10-07 DIAGNOSIS — E876 Hypokalemia: Secondary | ICD-10-CM | POA: Diagnosis present

## 2016-10-07 DIAGNOSIS — Z955 Presence of coronary angioplasty implant and graft: Secondary | ICD-10-CM

## 2016-10-07 DIAGNOSIS — G8194 Hemiplegia, unspecified affecting left nondominant side: Secondary | ICD-10-CM | POA: Diagnosis present

## 2016-10-07 DIAGNOSIS — I519 Heart disease, unspecified: Secondary | ICD-10-CM | POA: Diagnosis not present

## 2016-10-07 DIAGNOSIS — Z8673 Personal history of transient ischemic attack (TIA), and cerebral infarction without residual deficits: Secondary | ICD-10-CM | POA: Diagnosis present

## 2016-10-07 DIAGNOSIS — I252 Old myocardial infarction: Secondary | ICD-10-CM | POA: Diagnosis not present

## 2016-10-07 DIAGNOSIS — Z833 Family history of diabetes mellitus: Secondary | ICD-10-CM

## 2016-10-07 DIAGNOSIS — Z9114 Patient's other noncompliance with medication regimen: Secondary | ICD-10-CM

## 2016-10-07 HISTORY — DX: Malignant neoplasm of cervix uteri, unspecified: C53.9

## 2016-10-07 LAB — ECHOCARDIOGRAM COMPLETE
CHL CUP MV DEC (S): 155
CHL CUP RV SYS PRESS: 34 mmHg
CHL CUP TV REG PEAK VELOCITY: 220 cm/s
E decel time: 155 msec
EERAT: 18.98
FS: 10 % — AB (ref 28–44)
Height: 61 in
IVS/LV PW RATIO, ED: 0.97
LA diam end sys: 34 mm
LA diam index: 1.68 cm/m2
LA vol A4C: 38.6 ml
LA vol index: 21.1 mL/m2
LA vol: 42.6 mL
LASIZE: 34 mm
LV SIMPSON'S DISK: 27
LV TDI E'LATERAL: 5.48
LV e' LATERAL: 5.48 cm/s
LVDIAVOL: 106 mL (ref 46–106)
LVDIAVOLIN: 52 mL/m2
LVEEAVG: 18.98
LVEEMED: 18.98
LVOT SV: 32 mL
LVOT VTI: 10.1 cm
LVOT area: 3.14 cm2
LVOT diameter: 20 mm
LVOT peak grad rest: 2 mmHg
LVOTPV: 66.6 cm/s
LVSYSVOL: 77 mL — AB
LVSYSVOLIN: 38 mL/m2
Lateral S' vel: 4.65 cm/s
MV Peak grad: 4 mmHg
MV pk A vel: 60.6 m/s
MVPKEVEL: 104 m/s
PISA EROA: 0.12 cm2
PW: 12.7 mm — AB (ref 0.6–1.1)
Stroke v: 29 ml
TAPSE: 12 mm
TDI e' medial: 4.88
TR max vel: 220 cm/s
VTI: 142 cm
Weight: 3200 oz

## 2016-10-07 LAB — COMPREHENSIVE METABOLIC PANEL
ALBUMIN: 2.9 g/dL — AB (ref 3.5–5.0)
ALT: 11 U/L — ABNORMAL LOW (ref 14–54)
ANION GAP: 7 (ref 5–15)
AST: 15 U/L (ref 15–41)
Alkaline Phosphatase: 55 U/L (ref 38–126)
BILIRUBIN TOTAL: 1.3 mg/dL — AB (ref 0.3–1.2)
BUN: 11 mg/dL (ref 6–20)
CO2: 28 mmol/L (ref 22–32)
Calcium: 8.6 mg/dL — ABNORMAL LOW (ref 8.9–10.3)
Chloride: 101 mmol/L (ref 101–111)
Creatinine, Ser: 0.82 mg/dL (ref 0.44–1.00)
GFR calc Af Amer: >60 mL/min (ref >60)
Glucose, Bld: 160 mg/dL — ABNORMAL HIGH (ref 65–99)
POTASSIUM: 3.9 mmol/L (ref 3.5–5.1)
Sodium: 136 mmol/L (ref 135–145)
TOTAL PROTEIN: 6.1 g/dL — AB (ref 6.5–8.1)

## 2016-10-07 LAB — CBC WITH DIFFERENTIAL/PLATELET
BASOS ABS: 0 10*3/uL (ref 0.0–0.1)
BASOS PCT: 0 %
Eosinophils Absolute: 0.1 10*3/uL (ref 0.0–0.7)
Eosinophils Relative: 1 %
HCT: 45.2 % (ref 36.0–46.0)
Hemoglobin: 14.7 g/dL (ref 12.0–15.0)
LYMPHS PCT: 42 %
Lymphs Abs: 3.2 10*3/uL (ref 0.7–4.0)
MCH: 31.2 pg (ref 26.0–34.0)
MCHC: 32.5 g/dL (ref 30.0–36.0)
MCV: 96 fL (ref 78.0–100.0)
Monocytes Absolute: 0.4 10*3/uL (ref 0.1–1.0)
Monocytes Relative: 6 %
NEUTROS ABS: 3.8 10*3/uL (ref 1.7–7.7)
Neutrophils Relative %: 51 %
Platelets: 224 10*3/uL (ref 150–400)
RBC: 4.71 MIL/uL (ref 3.87–5.11)
RDW: 15.6 % — ABNORMAL HIGH (ref 11.5–15.5)
WBC: 7.5 10*3/uL (ref 4.0–10.5)

## 2016-10-07 LAB — CBG MONITORING, ED
Glucose-Capillary: 162 mg/dL — ABNORMAL HIGH (ref 65–99)
Glucose-Capillary: 192 mg/dL — ABNORMAL HIGH (ref 65–99)

## 2016-10-07 LAB — GLUCOSE, CAPILLARY
GLUCOSE-CAPILLARY: 125 mg/dL — AB (ref 65–99)
Glucose-Capillary: 133 mg/dL — ABNORMAL HIGH (ref 65–99)

## 2016-10-07 LAB — MAGNESIUM: Magnesium: 1.5 mg/dL — ABNORMAL LOW (ref 1.7–2.4)

## 2016-10-07 LAB — HEMOGLOBIN A1C
Hgb A1c MFr Bld: 9.9 % — ABNORMAL HIGH (ref 4.8–5.6)
Mean Plasma Glucose: 237.43 mg/dL

## 2016-10-07 LAB — BRAIN NATRIURETIC PEPTIDE: B NATRIURETIC PEPTIDE 5: 944 pg/mL — AB (ref 0.0–100.0)

## 2016-10-07 LAB — TSH: TSH: 17.574 u[IU]/mL — ABNORMAL HIGH (ref 0.350–4.500)

## 2016-10-07 MED ORDER — ACETAMINOPHEN 325 MG PO TABS
650.0000 mg | ORAL_TABLET | Freq: Once | ORAL | Status: AC
  Administered 2016-10-07: 650 mg via ORAL
  Filled 2016-10-07: qty 2

## 2016-10-07 MED ORDER — INSULIN ASPART 100 UNIT/ML ~~LOC~~ SOLN: 0.0000 [IU] | Freq: Every day | SUBCUTANEOUS | Status: DC

## 2016-10-07 MED ORDER — FUROSEMIDE 10 MG/ML IJ SOLN
40.0000 mg | Freq: Once | INTRAMUSCULAR | Status: AC
  Administered 2016-10-07: 40 mg via INTRAVENOUS
  Filled 2016-10-07: qty 4

## 2016-10-07 MED ORDER — INSULIN ASPART 100 UNIT/ML ~~LOC~~ SOLN
0.0000 [IU] | Freq: Three times a day (TID) | SUBCUTANEOUS | Status: DC
  Administered 2016-10-07: 3 [IU] via SUBCUTANEOUS
  Administered 2016-10-07 – 2016-10-08 (×2): 2 [IU] via SUBCUTANEOUS
  Administered 2016-10-08: 3 [IU] via SUBCUTANEOUS
  Administered 2016-10-08 – 2016-10-11 (×4): 2 [IU] via SUBCUTANEOUS
  Filled 2016-10-07: qty 1

## 2016-10-07 MED ORDER — FENOFIBRATE 160 MG PO TABS
160.0000 mg | ORAL_TABLET | Freq: Every day | ORAL | Status: DC
  Administered 2016-10-07 – 2016-10-14 (×7): 160 mg via ORAL
  Filled 2016-10-07 (×12): qty 1

## 2016-10-07 MED ORDER — LEVOTHYROXINE SODIUM 25 MCG PO TABS
12.5000 ug | ORAL_TABLET | Freq: Every day | ORAL | Status: DC
  Administered 2016-10-08: 12.5 ug via ORAL
  Filled 2016-10-07: qty 0.5
  Filled 2016-10-07: qty 1
  Filled 2016-10-07 (×2): qty 0.5

## 2016-10-07 MED ORDER — SODIUM CHLORIDE 0.9 % IV SOLN: 250.0000 mL | INTRAVENOUS | Status: DC | PRN

## 2016-10-07 MED ORDER — NIACIN 500 MG PO TABS
500.0000 mg | ORAL_TABLET | Freq: Every day | ORAL | Status: DC
  Administered 2016-10-09 – 2016-10-14 (×5): 500 mg via ORAL
  Filled 2016-10-07 (×13): qty 1

## 2016-10-07 MED ORDER — LISINOPRIL 10 MG PO TABS
20.0000 mg | ORAL_TABLET | Freq: Every day | ORAL | Status: DC
  Administered 2016-10-07 – 2016-10-08 (×2): 20 mg via ORAL
  Filled 2016-10-07 (×2): qty 2

## 2016-10-07 MED ORDER — CITALOPRAM HYDROBROMIDE 20 MG PO TABS
20.0000 mg | ORAL_TABLET | Freq: Every day | ORAL | Status: DC
  Administered 2016-10-07 – 2016-10-14 (×7): 20 mg via ORAL
  Filled 2016-10-07 (×12): qty 1

## 2016-10-07 MED ORDER — ATORVASTATIN CALCIUM 20 MG PO TABS
20.0000 mg | ORAL_TABLET | Freq: Every day | ORAL | Status: DC
  Administered 2016-10-07 – 2016-10-09 (×3): 20 mg via ORAL
  Filled 2016-10-07 (×7): qty 1

## 2016-10-07 MED ORDER — ASPIRIN EC 325 MG PO TBEC
325.0000 mg | DELAYED_RELEASE_TABLET | Freq: Every day | ORAL | Status: DC
  Administered 2016-10-07 – 2016-10-14 (×7): 325 mg via ORAL
  Filled 2016-10-07 (×8): qty 1

## 2016-10-07 MED ORDER — FUROSEMIDE 10 MG/ML IJ SOLN
40.0000 mg | Freq: Two times a day (BID) | INTRAMUSCULAR | Status: DC
  Administered 2016-10-07 – 2016-10-09 (×4): 40 mg via INTRAVENOUS
  Filled 2016-10-07 (×4): qty 4

## 2016-10-07 MED ORDER — ENOXAPARIN SODIUM 40 MG/0.4ML ~~LOC~~ SOLN
40.0000 mg | SUBCUTANEOUS | Status: DC
  Administered 2016-10-07 – 2016-10-14 (×8): 40 mg via SUBCUTANEOUS
  Filled 2016-10-07 (×8): qty 0.4

## 2016-10-07 MED ORDER — SODIUM CHLORIDE 0.9% FLUSH
3.0000 mL | Freq: Two times a day (BID) | INTRAVENOUS | Status: DC
  Administered 2016-10-07 – 2016-10-12 (×8): 3 mL via INTRAVENOUS

## 2016-10-07 MED ORDER — HYDROCODONE-ACETAMINOPHEN 5-325 MG PO TABS
1.0000 | ORAL_TABLET | Freq: Once | ORAL | Status: AC
  Administered 2016-10-07: 1 via ORAL
  Filled 2016-10-07: qty 1

## 2016-10-07 MED ORDER — NITROGLYCERIN 2 % TD OINT
1.0000 [in_us] | TOPICAL_OINTMENT | Freq: Once | TRANSDERMAL | Status: AC
  Administered 2016-10-07: 1 [in_us] via TOPICAL
  Filled 2016-10-07: qty 1

## 2016-10-07 MED ORDER — ACETAMINOPHEN 325 MG PO TABS
650.0000 mg | ORAL_TABLET | ORAL | Status: DC | PRN
  Administered 2016-10-07 – 2016-10-14 (×2): 650 mg via ORAL
  Filled 2016-10-07 (×2): qty 2

## 2016-10-07 MED ORDER — ONDANSETRON HCL 4 MG/2ML IJ SOLN
4.0000 mg | Freq: Four times a day (QID) | INTRAMUSCULAR | Status: DC | PRN
  Administered 2016-10-07 – 2016-10-09 (×5): 4 mg via INTRAVENOUS
  Filled 2016-10-07 (×5): qty 2

## 2016-10-07 MED ORDER — SODIUM CHLORIDE 0.9% FLUSH
3.0000 mL | INTRAVENOUS | Status: DC | PRN
  Administered 2016-10-07: 3 mL via INTRAVENOUS
  Filled 2016-10-07: qty 3

## 2016-10-07 MED ORDER — METOPROLOL TARTRATE 25 MG PO TABS
25.0000 mg | ORAL_TABLET | Freq: Two times a day (BID) | ORAL | Status: DC
  Administered 2016-10-07 (×2): 25 mg via ORAL
  Filled 2016-10-07 (×2): qty 1

## 2016-10-07 NOTE — ED Provider Notes (Signed)
[facility] DEPT Provider Note   CSN: [id number] Arrival date & time: 10/07/16  0057     History   Chief Complaint Chief Complaint  Patient presents with  . Leg Swelling    HPI Monica Carlson is a 51 y.o. female.  The history is provided by the patient and a friend.  Illness  This is a new problem. The current episode started more than 1 week ago. The problem occurs daily. The problem has been gradually worsening. Associated symptoms include shortness of breath. Pertinent negatives include no chest pain, no abdominal pain and no headaches. The symptoms are aggravated by walking. The symptoms are relieved by rest.  Patient presents with multiple complaints She reports since last month she has had LE swelling that is not improving She also reports she had a viral illness last month (vomiting/diarrhea) and after that resolved her symptoms began No CP but some cough/sob She reports weight gain as well No fever  Currently her main concern is her LE swelling Denies h/o DVT   Past Medical History:  Diagnosis Date  . CHF (congestive heart failure) ([place]) 07-2014  . Diabetes mellitus without complication ([place]) 4332  . High cholesterol   . Hypertension   . MI (myocardial infarction) ([place])    2004  . Stroke [facility]) 09/2014, 2013    Patient Active Problem List   Diagnosis Date Noted  . Personal history of noncompliance with medical treatment, presenting hazards to health 12/26/2015  . Hypothyroidism 05/30/2015  . Hyperlipidemia 05/30/2015  . Uncontrolled type 2 diabetes mellitus with complication ([place]) 95/18/8416  . Coronary artery disease involving native coronary artery of native heart without angina pectoris 05/04/2015  . Essential hypertension, benign 05/04/2015  . Obesity 05/04/2015  . Stented coronary artery 05/04/2015  . History of MI (myocardial infarction) 05/04/2015  . History of CVA (cerebrovascular accident) 05/04/2015  . Hypertensive cardiomyopathy ([place]) 05/04/2015      Past Surgical History:  Procedure Laterality Date  . CHOLECYSTECTOMY  1992  . CORONARY STENT PLACEMENT  2004  . TUBAL LIGATION      OB History    No data available       Home Medications    Prior to Admission medications   Medication Sig Start Date End Date Taking? Authorizing Provider  ACCU-CHEK AVIVA PLUS test strip TEST BLOOD SUGAR ONCE DAILY AS NEEDED 12/30/15   Soyla Dryer, PA-C  ACCU-CHEK SOFTCLIX LANCETS lancets TEST BLOOD SUGAR ONCE DAILY AS NEEDED 12/30/15   Soyla Dryer, PA-C  aspirin EC 325 MG tablet Take 325 mg by mouth daily.    [provider]  atorvastatin (LIPITOR) 20 MG tablet TAKE 1 TABLET BY MOUTH EVERY DAY 01/27/16   Soyla Dryer, PA-C  atorvastatin (LIPITOR) 20 MG tablet TAKE 1 TABLET BY MOUTH EVERY DAY 03/30/16   Soyla Dryer, PA-C  Bisacodyl (LAXATIVE PO) Take 1 tablet by mouth as needed.    [provider]  citalopram (CELEXA) 20 MG tablet TAKE ONE TABLET BY MOUTH ONCE DAILY 01/27/16   Soyla Dryer, PA-C  citalopram (CELEXA) 20 MG tablet TAKE ONE TABLET BY MOUTH ONCE DAILY 03/30/16   Soyla Dryer, PA-C  fenofibrate (TRICOR) 145 MG tablet Take 1 tablet (145 mg total) by mouth daily. 12/24/15   Soyla Dryer, PA-C  glimepiride (AMARYL) 4 MG tablet Take 1 tablet (4 mg total) by mouth daily before breakfast. 12/24/15   Soyla Dryer, PA-C  Levothyroxine Sodium 25 MCG CAPS Take 0.5 capsules (12.5 mcg total) by mouth daily before breakfast.  05/27/15   Soyla Dryer, PA-C  lisinopril (PRINIVIL,ZESTRIL) 40 MG tablet TAKE ONE TABLET BY MOUTH EVERY DAY FOR BLOOD PRESSURE 01/27/16   Soyla Dryer, PA-C  lisinopril (PRINIVIL,ZESTRIL) 40 MG tablet TAKE 1 TABLET BY MOUTH EVERY DAY FOR BLOOD PRESSURE 03/30/16   Soyla Dryer, PA-C  metFORMIN (GLUCOPHAGE) 1000 MG tablet TAKE ONE TABLET BY MOUTH TWICE DAILY 01/27/16   Soyla Dryer, PA-C  metFORMIN (GLUCOPHAGE) 1000 MG tablet TAKE 1 TABLET BY MOUTH TWICE DAILY 03/30/16   Soyla Dryer, PA-C  metoprolol (LOPRESSOR) 50 MG tablet TAKE ONE TABLET BY MOUTH TWICE DAILY FOR BLOOD PRESSURE 01/27/16   Soyla Dryer, PA-C  metoprolol (LOPRESSOR) 50 MG tablet TAKE ONE TABLET BY MOUTH TWICE DAILY FOR BLOOD PRESSURE 03/30/16   Soyla Dryer, PA-C  Multiple Vitamins-Minerals (MULTIVITAMIN WITH MINERALS) tablet Take 1 tablet by mouth daily.    [provider]  Multiple Vitamins-Minerals (VISION FORMULA/LUTEIN) TABS Take 1 tablet by mouth at bedtime.    [provider]  niacin 500 MG tablet Take 500 mg by mouth daily.    [provider]  nitroGLYCERIN (NITROSTAT) 0.4 MG SL tablet Place 1 tablet (0.4 mg total) under the tongue every 5 (five) minutes as needed for chest pain. 12/24/15   Soyla Dryer, PA-C  SYNTHROID 25 MCG tablet TAKE 1/2 TABLET BY MOUTH EVERY DAY FOR THYROID 01/27/16   Soyla Dryer, PA-C  SYNTHROID 25 MCG tablet TAKE 1/2 TABLET BY MOUTH EVERY DAY FOR THYROID 03/30/16   Soyla Dryer, PA-C    Family History Family History  Problem Relation Age of Onset  . Migraines Mother   . Heart disease Father   . Hypertension Father   . Diabetes Father   . Migraines Maternal Grandfather   . Heart attack Maternal Grandfather     Social History Social History  Substance Use Topics  . Smoking status: Never Smoker  . Smokeless tobacco: Never Used  . Alcohol use No     Allergies   Patient has no known allergies.   Review of Systems Review of Systems  Constitutional: Positive for unexpected weight change. Negative for fever.  Respiratory: Positive for shortness of breath.   Cardiovascular: Positive for leg swelling. Negative for chest pain.  Gastrointestinal: Negative for abdominal pain.  Neurological: Negative for headaches.  All other systems reviewed and are negative.    Physical Exam Updated Vital Signs BP (!) 149/120   Pulse 96   Temp 97.8 F (36.6 C) (Oral)   Resp 20   Ht 1.549 m (5\' 1" )   Wt 90.7 kg (200 lb)    SpO2 99%   BMI 37.79 kg/m   Physical Exam  CONSTITUTIONAL: Disheveled, no acute distress HEAD: Normocephalic/atraumatic EYES: EOMI/PERRL ENMT: Mucous membranes moist NECK: supple no meningeal signs SPINE/BACK:entire spine nontender CV: S1/S2 noted, no murmurs/rubs/gallops noted LUNGS: decreased breath sounds bilaterally ABDOMEN: soft, nontender, no rebound or guarding, bowel sounds noted throughout abdomen GU:no cva tenderness NEURO: Pt is awake/alert/appropriate, moves all extremitiesx4.  No facial droop.   EXTREMITIES: pulses normal/equal, full ROM, 2+ symmetric pitting edema to bilateral LE.  No cellulitis or abscess noted to lower extremities SKIN: warm, color normal PSYCH: no abnormalities of mood noted, alert and oriented to situation  ED Treatments / Results  Labs (all labs ordered are listed, but only abnormal results are displayed) Labs Reviewed  COMPREHENSIVE METABOLIC PANEL - Abnormal; Notable for the following:       Result Value   Glucose, Bld 160 (*)  Calcium 8.6 (*)    Total Protein 6.1 (*)    Albumin 2.9 (*)    ALT 11 (*)    Total Bilirubin 1.3 (*)    All other components within normal limits  CBC WITH DIFFERENTIAL/PLATELET - Abnormal; Notable for the following:    RDW 15.6 (*)    All other components within normal limits  BRAIN NATRIURETIC PEPTIDE - Abnormal; Notable for the following:    B Natriuretic Peptide 944.0 (*)    All other components within normal limits    EKG  EKG Interpretation  Date/Time:  Friday October 07 2016 05:02:41 EDT Ventricular Rate:  96 PR Interval:    QRS Duration: 106 QT Interval:  390 QTC Calculation: 493 R Axis:   62 Text Interpretation:  Sinus rhythm Borderline T abnormalities, diffuse leads Borderline prolonged QT interval Interpretation limited secondary to artifact Confirmed by Ripley Fraise ([id number]) on 10/07/2016 5:18:58 AM       Radiology Dg Chest 2 View  Result Date: 10/07/2016 CLINICAL DATA:  Bilateral  leg swelling, nonproductive cough since July. EXAM: CHEST  2 VIEW COMPARISON:  Chest radiograph September 17, 2016 FINDINGS: Cardiac silhouette is similarly enlarged, mediastinal silhouette is nonsuspicious considering AP technique. Pulmonary vascular congestion and mild interstitial prominence. Small pleural effusions are unchanged. Persistently elevated RIGHT hemidiaphragm. No pneumothorax. Soft tissue planes and included osseous structures are unchanged. IMPRESSION: Stable cardiomegaly. Interstitial prominence favoring pulmonary edema with small pleural effusions. Electronically Signed   By: Elon Alas M.D.   On: 10/07/2016 05:40    Procedures Procedures (including critical care time)  Medications Ordered in ED Medications  acetaminophen (TYLENOL) tablet 650 mg (650 mg Oral Given 10/07/16 0512)  furosemide (LASIX) injection 40 mg (40 mg Intravenous Given 10/07/16 0607)  nitroGLYCERIN (NITROGLYN) 2 % ointment 1 inch (1 inch Topical Given 10/07/16 0607)  HYDROcodone-acetaminophen (NORCO/VICODIN) 5-325 MG per tablet 1 tablet (1 tablet Oral Given 10/07/16 7353)     Initial Impression / Assessment and Plan / ED Course  I have reviewed the triage vital signs and the nursing notes.  Pertinent labs & imaging results that were available during my care of the patient were reviewed by me and considered in my medical decision making (see chart for details).     Pt very poor historian ,she failed to mentioned she was in [facility] ER for similar episode this summer until late in my interview, but reports she was not admitted  Pt with probable untreated CHF Will treat at this time  7:36 AM Will admit for CHF exacerbation.  She had some tachypnea with exertion, pulse ox to 93%.  Due to worsening condition and poor understanding of her illness, advised admission  D/w dr tat for admission   Final Clinical Impressions(s) / ED Diagnoses   Final diagnoses:  Acute systolic congestive heart failure  [facility])    New Prescriptions New Prescriptions   No medications on file     Ripley Fraise, MD 10/07/16 [phone]

## 2016-10-07 NOTE — ED Notes (Signed)
Meal given. Nad.

## 2016-10-07 NOTE — ED Triage Notes (Signed)
Multiple complaints, states her lower legs have been swollen for several weeks, intermittent diarrhea and vomiting, dry cough, states she does not have a pmd

## 2016-10-07 NOTE — ED Notes (Signed)
Pt ambulated around nurses station- lowest O2 93%

## 2016-10-07 NOTE — H&P (Signed)
History and Physical  [name] DOB: 11-21-1965 DOA: 10/07/2016   PCP: [facility] [place]   Patient coming from: Home  Chief Complaint: dyspnea  HPI:  Monica Carlson is a 51 y.o. female with medical history of essential hypertension, diabetes mellitus, coronary artery disease, and CHF presented with 1-2 week history of shortness of breath, leg edema, and dyspnea on exertion. The patient states that she was admitted to [facility] the third week of July. She states that she was discharged with a fluid pill with which she has been compliant. However, in the past week she states that she has not been urinating as much. Therefore, she has been checking more fluid in hopes to make herself urinate more. She has had difficulty laying flat. For the past month, she has had to sleep sitting up in a recliner. She denies any fevers, chills, headache, neck pain, chest pain, nausea, vomiting, diarrhea, dysuria. She states that she does not have a scale to weigh herself. In the emergency department, the patient was afebrile and hemodynamically stable saturating 93% on room air. BMP and CBC were unremarkable. Hepatic enzymes were unremarkable. BNP was 944. Chest x-ray showed interstitial prominence with small bilateral pleural effusions. EKG showed sinus rhythm with nonspecific T-wave changes. The patient was given furosemide 40 mg IV 1 and placed on Nitropaste.  Assessment/Plan: Acute on chronic diastolic CHF -daily weights -accurate I/O's -fluid restrict -Echo -continue IV Lasix 40 mg bid -pt appears to have poor insight on her medical condition -Continue metoprolol and lisinopril  Diabetes mellitus type 2, uncontrolled -Check hemoglobin A1c -NovoLog sliding scale -Holding metformin and Amaryl  Hyperlipidemia -Continue statin and TriCor  Essential hypertension -Anticipate improvement with diuresis -Continue metoprolol tartrate and lisinopril  Coronary artery disease  with hx MI -11/22/2002 for heart catheterization--stented mid RCA, EF 69% -No chest pain presently -Continue aspirin -Continue metoprolol tartrate  Hypothyroidism -Continue Synthroid        Past Medical History:  Diagnosis Date  . CHF (congestive heart failure) ([place]) 07-2014  . Diabetes mellitus without complication ([place]) 3474  . High cholesterol   . Hypertension   . MI (myocardial infarction) ([place])    2004  . Stroke [facility]) 09/2014, 2013   Past Surgical History:  Procedure Laterality Date  . CHOLECYSTECTOMY  1992  . CORONARY STENT PLACEMENT  2004  . TUBAL LIGATION     Social History:  reports that she has never smoked. She has never used smokeless tobacco. She reports that she does not drink alcohol or use drugs.   Family History  Problem Relation Age of Onset  . Migraines Mother   . Heart disease Father   . Hypertension Father   . Diabetes Father   . Migraines Maternal Grandfather   . Heart attack Maternal Grandfather      No Known Allergies   Prior to Admission medications   Medication Sig Start Date End Date Taking? Authorizing Provider  ACCU-CHEK AVIVA PLUS test strip TEST BLOOD SUGAR ONCE DAILY AS NEEDED 12/30/15   Soyla Dryer, PA-C  ACCU-CHEK SOFTCLIX LANCETS lancets TEST BLOOD SUGAR ONCE DAILY AS NEEDED 12/30/15   Soyla Dryer, PA-C  aspirin EC 325 MG tablet Take 325 mg by mouth daily.    [provider]  atorvastatin (LIPITOR) 20 MG tablet TAKE 1 TABLET BY MOUTH EVERY DAY 01/27/16   Soyla Dryer, PA-C  atorvastatin (LIPITOR) 20 MG tablet TAKE 1 TABLET BY MOUTH EVERY DAY 03/30/16   Soyla Dryer,  PA-C  Bisacodyl (LAXATIVE PO) Take 1 tablet by mouth as needed.    [provider]  citalopram (CELEXA) 20 MG tablet TAKE ONE TABLET BY MOUTH ONCE DAILY 01/27/16   Soyla Dryer, PA-C  citalopram (CELEXA) 20 MG tablet TAKE ONE TABLET BY MOUTH ONCE DAILY 03/30/16   Soyla Dryer, PA-C  fenofibrate (TRICOR) 145 MG tablet Take 1  tablet (145 mg total) by mouth daily. 12/24/15   Soyla Dryer, PA-C  glimepiride (AMARYL) 4 MG tablet Take 1 tablet (4 mg total) by mouth daily before breakfast. 12/24/15   Soyla Dryer, PA-C  Levothyroxine Sodium 25 MCG CAPS Take 0.5 capsules (12.5 mcg total) by mouth daily before breakfast. 05/27/15   Soyla Dryer, PA-C  lisinopril (PRINIVIL,ZESTRIL) 40 MG tablet TAKE ONE TABLET BY MOUTH EVERY DAY FOR BLOOD PRESSURE 01/27/16   Soyla Dryer, PA-C  lisinopril (PRINIVIL,ZESTRIL) 40 MG tablet TAKE 1 TABLET BY MOUTH EVERY DAY FOR BLOOD PRESSURE 03/30/16   Soyla Dryer, PA-C  metFORMIN (GLUCOPHAGE) 1000 MG tablet TAKE ONE TABLET BY MOUTH TWICE DAILY 01/27/16   Soyla Dryer, PA-C  metFORMIN (GLUCOPHAGE) 1000 MG tablet TAKE 1 TABLET BY MOUTH TWICE DAILY 03/30/16   Soyla Dryer, PA-C  metoprolol (LOPRESSOR) 50 MG tablet TAKE ONE TABLET BY MOUTH TWICE DAILY FOR BLOOD PRESSURE 01/27/16   Soyla Dryer, PA-C  metoprolol (LOPRESSOR) 50 MG tablet TAKE ONE TABLET BY MOUTH TWICE DAILY FOR BLOOD PRESSURE 03/30/16   Soyla Dryer, PA-C  Multiple Vitamins-Minerals (MULTIVITAMIN WITH MINERALS) tablet Take 1 tablet by mouth daily.    [provider]  Multiple Vitamins-Minerals (VISION FORMULA/LUTEIN) TABS Take 1 tablet by mouth at bedtime.    [provider]  niacin 500 MG tablet Take 500 mg by mouth daily.    [provider]  nitroGLYCERIN (NITROSTAT) 0.4 MG SL tablet Place 1 tablet (0.4 mg total) under the tongue every 5 (five) minutes as needed for chest pain. 12/24/15   Soyla Dryer, PA-C  SYNTHROID 25 MCG tablet TAKE 1/2 TABLET BY MOUTH EVERY DAY FOR THYROID 01/27/16   Soyla Dryer, PA-C  SYNTHROID 25 MCG tablet TAKE 1/2 TABLET BY MOUTH EVERY DAY FOR THYROID 03/30/16   Soyla Dryer, PA-C    Review of Systems:  Constitutional:  No weight loss, night sweats, Fevers, chills, fatigue.  Head&Eyes: No headache.  No vision loss.  No eye pain or scotoma ENT:    No Difficulty swallowing,Tooth/dental problems,Sore throat,  No ear ache, post nasal drip,  Cardio-vascular:  No chest pain, Orthopnea, PND, swelling in lower extremities,  dizziness, palpitations  GI:  No  abdominal pain, nausea, vomiting, diarrhea, loss of appetite, hematochezia, melena, heartburn, indigestion, Resp:  . No coughing up of blood .No wheezing.No chest wall deformity  Skin:  no rash or lesions.  GU:  no dysuria, change in color of urine, no urgency or frequency. No flank pain.  Musculoskeletal:  No joint pain or swelling. No decreased range of motion. No back pain.  Psych:  No change in mood or affect. No depression or anxiety. Neurologic: No headache, no dysesthesia, no focal weakness, no vision loss. No syncope  Physical Exam: Vitals:   10/07/16 0600 10/07/16 0630 10/07/16 0700 10/07/16 0730  BP: (!) 148/119 (!) 139/105 (!) 149/113 (!) 149/113  Pulse: 99 94 98 95  Resp: 18 18 (!) 24 (!) 22  Temp:      TempSrc:      SpO2: 98% 98% 100% 96%  Weight:      Height:  General:  A&O x 3, NAD, nontoxic, pleasant/cooperative Head/Eye: No conjunctival hemorrhage, no icterus, [place]/AT, No nystagmus ENT:  No icterus,  No thrush, good dentition, no pharyngeal exudate Neck:  No masses, no lymphadenpathy, no bruits CV:  RRR, no rub, no gallop, no S3; +JVD Lung:  Bibasilar crackles. No wheezing. Good air movement. Abdomen: soft/NT, +BS, nondistended, no peritoneal signs Ext: No cyanosis, No rashes, No petechiae, No lymphangitis, 2+ LE edema Neuro: CNII-XII intact, strength 4/5 in bilateral upper and lower extremities, no dysmetria  Labs on Admission:  Basic Metabolic Panel:  Recent Labs Lab 10/07/16 0213  NA 136  K 3.9  CL 101  CO2 28  GLUCOSE 160*  BUN 11  CREATININE 0.82  CALCIUM 8.6*   Liver Function Tests:  Recent Labs Lab 10/07/16 0213  AST 15  ALT 11*  ALKPHOS 55  BILITOT 1.3*  PROT 6.1*  ALBUMIN 2.9*   No results for input(s): LIPASE,  AMYLASE in the last 168 hours. No results for input(s): AMMONIA in the last 168 hours. CBC:  Recent Labs Lab 10/07/16 0213  WBC 7.5  NEUTROABS 3.8  HGB 14.7  HCT 45.2  MCV 96.0  PLT 224   Coagulation Profile: No results for input(s): INR, PROTIME in the last 168 hours. Cardiac Enzymes: No results for input(s): CKTOTAL, CKMB, CKMBINDEX, TROPONINI in the last 168 hours. BNP: Invalid input(s): POCBNP CBG: No results for input(s): GLUCAP in the last 168 hours. Urine analysis:    Component Value Date/Time   COLORURINE YELLOW 06/19/2014 0846   APPEARANCEUR TURBID (A) 06/19/2014 0846   LABSPEC 1.029 06/19/2014 0846   PHURINE 5.5 06/19/2014 0846   GLUCOSEU >1000 (A) 06/19/2014 0846   HGBUR MODERATE (A) 06/19/2014 0846   BILIRUBINUR NEGATIVE 06/19/2014 0846   KETONESUR NEGATIVE 06/19/2014 0846   PROTEINUR 30 (A) 06/19/2014 0846   UROBILINOGEN 0.2 06/19/2014 0846   NITRITE POSITIVE (A) 06/19/2014 0846   LEUKOCYTESUR LARGE (A) 06/19/2014 0846   Sepsis Labs: @LABRCNTIP (procalcitonin:4,lacticidven:4) )No results found for this or any previous visit (from the past 240 hour(s)).   Radiological Exams on Admission: Dg Chest 2 View  Result Date: 10/07/2016 CLINICAL DATA:  Bilateral leg swelling, nonproductive cough since July. EXAM: CHEST  2 VIEW COMPARISON:  Chest radiograph September 17, 2016 FINDINGS: Cardiac silhouette is similarly enlarged, mediastinal silhouette is nonsuspicious considering AP technique. Pulmonary vascular congestion and mild interstitial prominence. Small pleural effusions are unchanged. Persistently elevated RIGHT hemidiaphragm. No pneumothorax. Soft tissue planes and included osseous structures are unchanged. IMPRESSION: Stable cardiomegaly. Interstitial prominence favoring pulmonary edema with small pleural effusions. Electronically Signed   By: Elon Alas M.D.   On: 10/07/2016 05:40    EKG: Independently reviewed. Sinus, nonspecific T wave  change    Time spent:60 minutes Code Status:   FULL Family Communication:  Family friend update at bedside Disposition Plan: expect 2-3 day hospitalization Consults called: none DVT Prophylaxis: [place] Lovenox  Anupama Piehl, DO  [facility] Pager [phone]  If 7PM-7AM, please contact night-coverage [email or web address] Password [facility] 10/07/2016, 7:47 AM

## 2016-10-07 NOTE — Progress Notes (Signed)
*  PRELIMINARY RESULTS* Echocardiogram 2D Echocardiogram has been performed.  Monica Carlson 10/07/2016, 4:15 PM

## 2016-10-08 DIAGNOSIS — E785 Hyperlipidemia, unspecified: Secondary | ICD-10-CM

## 2016-10-08 DIAGNOSIS — I5043 Acute on chronic combined systolic (congestive) and diastolic (congestive) heart failure: Secondary | ICD-10-CM

## 2016-10-08 LAB — BASIC METABOLIC PANEL
Anion gap: 9 (ref 5–15)
BUN: 14 mg/dL (ref 6–20)
CALCIUM: 8.6 mg/dL — AB (ref 8.9–10.3)
CO2: 27 mmol/L (ref 22–32)
CREATININE: 1.02 mg/dL — AB (ref 0.44–1.00)
Chloride: 100 mmol/L — ABNORMAL LOW (ref 101–111)
GFR calc Af Amer: >60 mL/min (ref >60)
GFR calc non Af Amer: >60 mL/min (ref >60)
GLUCOSE: 134 mg/dL — AB (ref 65–99)
Potassium: 3.8 mmol/L (ref 3.5–5.1)
Sodium: 136 mmol/L (ref 135–145)

## 2016-10-08 LAB — LIPID PANEL
CHOLESTEROL: 147 mg/dL (ref 0–200)
HDL: 21 mg/dL — ABNORMAL LOW (ref >40)
LDL Cholesterol: 106 mg/dL — ABNORMAL HIGH (ref 0–99)
TRIGLYCERIDES: 100 mg/dL (ref <150)
Total CHOL/HDL Ratio: 7 RATIO
VLDL: 20 mg/dL (ref 0–40)

## 2016-10-08 LAB — GLUCOSE, CAPILLARY
GLUCOSE-CAPILLARY: 130 mg/dL — AB (ref 65–99)
GLUCOSE-CAPILLARY: 139 mg/dL — AB (ref 65–99)
Glucose-Capillary: 168 mg/dL — ABNORMAL HIGH (ref 65–99)
Glucose-Capillary: 165 mg/dL — ABNORMAL HIGH (ref 65–99)

## 2016-10-08 LAB — HIV ANTIBODY (ROUTINE TESTING W REFLEX): HIV Screen 4th Generation wRfx: NONREACTIVE

## 2016-10-08 LAB — MAGNESIUM: Magnesium: 1.6 mg/dL — ABNORMAL LOW (ref 1.7–2.4)

## 2016-10-08 MED ORDER — LEVOTHYROXINE SODIUM 50 MCG PO TABS
50.0000 ug | ORAL_TABLET | Freq: Every day | ORAL | Status: DC
  Administered 2016-10-09 – 2016-10-14 (×5): 50 ug via ORAL
  Filled 2016-10-08 (×5): qty 1

## 2016-10-08 MED ORDER — MAGNESIUM SULFATE 2 GM/50ML IV SOLN
2.0000 g | Freq: Once | INTRAVENOUS | Status: AC
  Administered 2016-10-08: 2 g via INTRAVENOUS
  Filled 2016-10-08: qty 50

## 2016-10-08 MED ORDER — METOPROLOL TARTRATE 25 MG PO TABS
12.5000 mg | ORAL_TABLET | Freq: Two times a day (BID) | ORAL | Status: DC
  Administered 2016-10-08 – 2016-10-09 (×3): 12.5 mg via ORAL
  Filled 2016-10-08 (×3): qty 1

## 2016-10-08 NOTE — Progress Notes (Signed)
PROGRESS NOTE  Monica Carlson OBS:[id number] DOB: Jan 17, 1966 DOA: 10/07/2016 PCP: [facility] [place]  Brief History:   51 y.o. female with medical history of essential hypertension, diabetes mellitus, coronary artery disease, and CHF presented with 1-2 week history of shortness of breath, leg edema, and dyspnea on exertion. The patient states that she was admitted to [facility] the third week of July. She states that she was discharged with a fluid pill with which she has been compliant. However, in the past week she states that she has not been urinating as much. Therefore, she has been drinking more fluid in hopes to make herself urinate more. She has had difficulty laying flat. For the past month, she has had to sleep sitting up in a recliner.  In the emergency department, the patient was afebrile and hemodynamically stable saturating 93% on room air. BMP and CBC were unremarkable. Hepatic enzymes were unremarkable. BNP was 944. Chest x-ray showed interstitial prominence with small bilateral pleural effusions.  The patient was started on IV lasix with good clinical results.  Assessment/Plan: Acute on chronic diastolic CHF -daily weights -accurate I/O's--NEG 1.4L since admission -fluid restrict -Echo--EF 20%, diffuse HK, grade 2 DD, significant regional wall motion abnormalities. Decreased RV function  -continue IV Lasix 40 mg bid -pt appears to have poor insight on her medical condition -d/c lisinopril due to now soft BPs -decrease dose of metoprolol tartrate due to soft BPs  Diabetes mellitus type 2, uncontrolled -10/07/16- hemoglobin A1c--9.9 -NovoLog sliding scale -Holding metformin and Amaryl -CBGs controlled  Hyperlipidemia -Continue statin and TriCor  Essential hypertension -now BP soft with diuresis -d/c lisinopril due to now soft BPs -decrease dose of metoprolol tartrate due to soft BPs  Coronary artery disease with hx MI -11/22/2002 for heart  catheterization--stented mid RCA, EF 69% -No chest pain presently -Continue aspirin -Continue metoprolol tartrate  Hypothyroidism -Continue Synthroid  Hypomagnesemia -Replete    Disposition Plan:   Home in 1-2 days  Family Communication:   Advocate updated at bedside  Consultants:  none  Code Status:  FULL  DVT Prophylaxis:    Lovenox   Procedures: As Listed in Progress Note Above  Antibiotics: None    Subjective: Patient states that her breathing is better but she is having some nausea. Denies any fevers, chills, chest pain, shortness breath, abdominal pain, dysuria, hematuria patient one bowel movement yesterday. She is able lie flat now.  Objective: Vitals:   10/07/16 1639 10/07/16 1810 10/07/16 2210 10/08/16 0540  BP: 97/75  94/74 107/74  Pulse: 71  63 74  Resp: 16  18 14   Temp: 97.6 F (36.4 C)   97.6 F (36.4 C)  TempSrc: Oral  Oral Oral  SpO2: 97%  96% 92%  Weight:  100.1 kg (220 lb 10.9 oz)  99.6 kg (219 lb 8 oz)  Height: 5\' 1"  (1.549 m)       Intake/Output Summary (Last 24 hours) at 10/08/16 4765 Last data filed at 10/08/16 0700  Gross per 24 hour  Intake              360 ml  Output             1350 ml  Net             -990 ml   Weight change: 9.381 kg (20 lb 10.9 oz) Exam:   General:  Pt is alert, follows commands appropriately, not in acute distress  HEENT:  No icterus, No thrush, No neck mass, [place]/AT  Cardiovascular: RRR, S1/S2, no rubs, no gallops  Respiratory: Bibasilar crackles. No wheeze.  Abdomen: Soft/+BS, non tender, non distended, no guarding  Extremities: 1 +LE edema, No lymphangitis, No petechiae, No rashes, no synovitis   Data Reviewed: I have personally reviewed following labs and imaging studies Basic Metabolic Panel:  Recent Labs Lab 10/07/16 0213 10/07/16 0834 10/08/16 0628  NA 136  --  136  K 3.9  --  3.8  CL 101  --  100*  CO2 28  --  27  GLUCOSE 160*  --  134*  BUN 11  --  14  CREATININE 0.82  --   1.02*  CALCIUM 8.6*  --  8.6*  MG  --  1.5* 1.6*   Liver Function Tests:  Recent Labs Lab 10/07/16 0213  AST 15  ALT 11*  ALKPHOS 55  BILITOT 1.3*  PROT 6.1*  ALBUMIN 2.9*   No results for input(s): LIPASE, AMYLASE in the last 168 hours. No results for input(s): AMMONIA in the last 168 hours. Coagulation Profile: No results for input(s): INR, PROTIME in the last 168 hours. CBC:  Recent Labs Lab 10/07/16 0213  WBC 7.5  NEUTROABS 3.8  HGB 14.7  HCT 45.2  MCV 96.0  PLT 224   Cardiac Enzymes: No results for input(s): CKTOTAL, CKMB, CKMBINDEX, TROPONINI in the last 168 hours. BNP: Invalid input(s): POCBNP CBG:  Recent Labs Lab 10/07/16 0829 10/07/16 1158 10/07/16 1638 10/07/16 2039 10/08/16 0730  GLUCAP 162* 192* 133* 125* 168*   HbA1C:  Recent Labs  10/07/16 0834  HGBA1C 9.9*   Urine analysis:    Component Value Date/Time   COLORURINE YELLOW 06/19/2014 0846   APPEARANCEUR TURBID (A) 06/19/2014 0846   LABSPEC 1.029 06/19/2014 0846   PHURINE 5.5 06/19/2014 0846   GLUCOSEU >1000 (A) 06/19/2014 0846   HGBUR MODERATE (A) 06/19/2014 0846   BILIRUBINUR NEGATIVE 06/19/2014 0846   KETONESUR NEGATIVE 06/19/2014 0846   PROTEINUR 30 (A) 06/19/2014 0846   UROBILINOGEN 0.2 06/19/2014 0846   NITRITE POSITIVE (A) 06/19/2014 0846   LEUKOCYTESUR LARGE (A) 06/19/2014 0846   Sepsis Labs: @LABRCNTIP (procalcitonin:4,lacticidven:4) )No results found for this or any previous visit (from the past 240 hour(s)).   Scheduled Meds: . aspirin EC  325 mg Oral Daily  . atorvastatin  20 mg Oral Daily  . citalopram  20 mg Oral Daily  . enoxaparin (LOVENOX) injection  40 mg Subcutaneous Q24H  . fenofibrate  160 mg Oral Daily  . furosemide  40 mg Intravenous BID  . insulin aspart  0-15 Units Subcutaneous TID WC  . insulin aspart  0-5 Units Subcutaneous QHS  . levothyroxine  12.5 mcg Oral QAC breakfast  . lisinopril  20 mg Oral Daily  . metoprolol tartrate  12.5 mg Oral  BID  . niacin  500 mg Oral Daily  . sodium chloride flush  3 mL Intravenous Q12H   Continuous Infusions: . sodium chloride      Procedures/Studies: Dg Chest 2 View  Result Date: 10/07/2016 CLINICAL DATA:  Bilateral leg swelling, nonproductive cough since July. EXAM: CHEST  2 VIEW COMPARISON:  Chest radiograph September 17, 2016 FINDINGS: Cardiac silhouette is similarly enlarged, mediastinal silhouette is nonsuspicious considering AP technique. Pulmonary vascular congestion and mild interstitial prominence. Small pleural effusions are unchanged. Persistently elevated RIGHT hemidiaphragm. No pneumothorax. Soft tissue planes and included osseous structures are unchanged. IMPRESSION: Stable cardiomegaly. Interstitial prominence favoring pulmonary edema with small pleural effusions. Electronically  Signed   By: Elon Alas M.D.   On: 10/07/2016 05:40    Luara Faye, DO  [facility] Pager [phone]  If 7PM-7AM, please contact night-coverage [email or web address] Password [email or web address] 10/08/2016, 8:12 AM   LOS: 1 day

## 2016-10-08 NOTE — Progress Notes (Signed)
Nutrition Brief Note  Acknowledge Dietary education consult.  RD operating remotely. Pt has tentative d/c date for Monday. RD will be on site and attempt to see that day.   Thank you.   Burtis Junes RD, LDN, CNSC Clinical Nutrition Pager: [phone] 10/08/2016 4:55 PM

## 2016-10-08 NOTE — Progress Notes (Signed)
Pt does not have glucometer at home.

## 2016-10-09 ENCOUNTER — Inpatient Hospital Stay (HOSPITAL_COMMUNITY): Payer: Medicaid Other

## 2016-10-09 DIAGNOSIS — I5021 Acute systolic (congestive) heart failure: Secondary | ICD-10-CM | POA: Diagnosis present

## 2016-10-09 DIAGNOSIS — Z9189 Other specified personal risk factors, not elsewhere classified: Secondary | ICD-10-CM

## 2016-10-09 LAB — GLUCOSE, CAPILLARY
GLUCOSE-CAPILLARY: 121 mg/dL — AB (ref 65–99)
Glucose-Capillary: 134 mg/dL — ABNORMAL HIGH (ref 65–99)
Glucose-Capillary: 119 mg/dL — ABNORMAL HIGH (ref 65–99)
Glucose-Capillary: 128 mg/dL — ABNORMAL HIGH (ref 65–99)

## 2016-10-09 LAB — BASIC METABOLIC PANEL
Anion gap: 10 (ref 5–15)
BUN: 17 mg/dL (ref 6–20)
CALCIUM: 8.6 mg/dL — AB (ref 8.9–10.3)
CO2: 25 mmol/L (ref 22–32)
CREATININE: 1.4 mg/dL — AB (ref 0.44–1.00)
Chloride: 100 mmol/L — ABNORMAL LOW (ref 101–111)
GFR, EST AFRICAN AMERICAN: 49 mL/min — AB (ref >60)
GFR, EST NON AFRICAN AMERICAN: 43 mL/min — AB (ref >60)
Glucose, Bld: 105 mg/dL — ABNORMAL HIGH (ref 65–99)
Potassium: 3.6 mmol/L (ref 3.5–5.1)
Sodium: 135 mmol/L (ref 135–145)

## 2016-10-09 LAB — MAGNESIUM: Magnesium: 1.7 mg/dL (ref 1.7–2.4)

## 2016-10-09 MED ORDER — ONDANSETRON HCL 4 MG/2ML IJ SOLN: 4.0000 mg | Freq: Once | INTRAMUSCULAR | Status: AC

## 2016-10-09 MED ORDER — SODIUM CHLORIDE 0.9 % IV BOLUS (SEPSIS)
500.0000 mL | Freq: Once | INTRAVENOUS | Status: AC
  Administered 2016-10-09: 500 mL via INTRAVENOUS

## 2016-10-09 MED ORDER — MAGNESIUM SULFATE 2 GM/50ML IV SOLN
2.0000 g | Freq: Once | INTRAVENOUS | Status: AC
  Administered 2016-10-09: 2 g via INTRAVENOUS
  Filled 2016-10-09: qty 50

## 2016-10-09 MED ORDER — FUROSEMIDE 10 MG/ML IJ SOLN: 40.0000 mg | Freq: Every day | INTRAMUSCULAR | Status: DC

## 2016-10-09 NOTE — Progress Notes (Signed)
CTSP Re:  Unable to move right side since yesterday. 51 yo female admitted for CHF.  She has been diuresed, and her EF was low at 30%.  She also had prior mini strokes.  Yesterday, she was noted to have trouble with walking and was having weakness on the left side.  This is new for her.  She did not tell anybody, hoping it would be better.  The weakness persisted.  She has no facial symptoms.   She has been on ASA daily.  She has been on BB and Lasix.   Her BP is soft at syst of 92.  She appears volume depleted.   Her speech is fluent. Visual field is full  OD, and she can't see OS. Strength is clearly weaker on the left upper and lower extremity, about 3-4/5.  A/P:  She likely has a right MCA territory stroke.  Her ictus is clearly out of the TPA window.  Will obtain stat head CT, and obtain stroke work up to include MRI/MRA of the brain and carotid doppler.  She had ECHO a few days ago, so will not repeat.  Make NPO and ordered speech consultation.  Consider rectal ASA if she cannot swallow.  Also, consider adding Plavix to her ASA.    Orvan Falconer MD FACP. Hospitalist.

## 2016-10-09 NOTE — Progress Notes (Addendum)
In room to assess patient.  Patient c/o nausea, but alert and oriented x 4, although slow to respond at times. Patient c/o weakness to left arm and left leg.  Complete neuro assessment on patient, patient has drift on left arm, weak grip in left hand and no resistance to gravity with left leg.  Asked patient when symptoms started to occur, patient stated the weakness has been going on since earlier in the day when she was assisted to the bathroom, patient not sure of the time.  Patient stated she thought symptoms would lessen as they day went on.  Vitals taken and MD came to floor to assess patient.  Will await orders.

## 2016-10-09 NOTE — Progress Notes (Signed)
PROGRESS NOTE  Monica Carlson ALP:[id number] DOB: [birth year] DOA: 10/07/2016 PCP: Sandria Manly Langdon Brief History:  51 y.o.femalewith medical history of essential hypertension, diabetes mellitus, coronary artery disease, and CHF presented with 1-2week history of shortness of breath, leg edema, and dyspnea on exertion. The patient states that she was admitted to [facility] third week of July. She states that she was discharged with a fluid pill with which she has been compliant. However, in the past week she states that she has not been urinating as much. Therefore, she has been drinking more fluid in hopes to make herself urinate more. She has had difficulty laying flat. For the past month, she has had to sleep sitting up in a recliner.  In the emergency department, the patient was afebrile and hemodynamically stable saturating 93% on room air. BMP and CBC were unremarkable. Hepatic enzymes were unremarkable. BNP was 944. Chest x-ray showed interstitial prominence with small bilateral pleural effusions.  The patient was started on IV lasix with good clinical results.  Assessment/Plan: Acute on chronic systolic and diastolic CHF -daily weights--up one lb -now able to lay flat -accurate I/O's--not accurate -fluid restrict -Echo--EF 20%, diffuse HK, grade 2 DD, significant regional wall motion abnormalities. Decreased RV function  -continue IV Lasix 40 mg bid-->qday due to rising creatinine -pt appears to have poor insight on her medical condition; poor health literacy -d/c lisinopril due to now soft BPs and increasing creatinine -decrease dose of metoprolol tartrate due to soft BPs -consult cardiology 8/20  AKI -likely cardiorenal syndrome -decrease lasix -am BMP -renal US  Diabetes mellitus type 2, uncontrolled -10/07/16- hemoglobin A1c--9.9 -NovoLog sliding scale -Holding metformin and Amaryl -CBGs controlled during hospitalization--?compliance at  home  Hyperlipidemia -Continue statin and [facility]  Essential hypertension -now BP soft with diuresis -d/c lisinopril due to now soft BPs -decrease dose of metoprolol tartrate due to soft BPs  Coronary artery disease with hx MI -10/01/2004for heart catheterization--stented mid RCA, EF 69% -No chest pain presently -Continue aspirin -Continue metoprolol tartrate  Hypothyroidism -Continue Synthroid--increase dose to 50 mcg daily -TSH 17.57  Hypomagnesemia -Replete    Disposition Plan:   Home  When cleared by cardiology Family Communication:   No family at bedside  Consultants:  none  Code Status:  FULL  DVT Prophylaxis:   [place] Lovenox   Procedures: As Listed in Progress Note Above  Antibiotics: None     Subjective: Overall, patient is breathing much better. She denies any fevers, chills, headache, neck pain, chest pain, short of breath, nausea, vomiting, diarrhea, abdominal pain. No dysuria or hematuria.  Objective: Vitals:   10/08/16 1434 10/08/16 2102 10/09/16 0629 10/09/16 1500  BP: 100/62 112/80 (!) 102/58 121/77  Pulse: 62 65 87 63  Resp: 16 18 16 16   Temp: 98.1 F (36.7 C) 97.7 F (36.5 C) 97.6 F (36.4 C) 97.7 F (36.5 C)  TempSrc: Oral Oral Oral Oral  SpO2: 93% 96% 93% 95%  Weight:   99.9 kg (220 lb 3.8 oz)   Height:        Intake/Output Summary (Last 24 hours) at 10/09/16 1703 Last data filed at 10/09/16 1200  Gross per 24 hour  Intake              480 ml  Output                0 ml  Net  480 ml   Weight change: -0.2 kg (-7.1 oz) Exam:   General:  Pt is alert, follows commands appropriately, not in acute distress  HEENT: No icterus, No thrush, No neck mass, [employer]/AT  Cardiovascular: RRR, S1/S2, no rubs, no gallops  Respiratory: Fine bibasilar crackles and no wheezing. Abdomen movement  Abdomen: Soft/+BS, non tender, non distended, no guarding  Extremities: 1+LE edema, No lymphangitis, No petechiae, No  rashes, no synovitis   Data Reviewed: I have personally reviewed following labs and imaging studies Basic Metabolic Panel:  Recent Labs Lab 10/07/16 0213 10/07/16 0834 10/08/16 0628 10/09/16 0614  NA 136  --  136 135  K 3.9  --  3.8 3.6  CL 101  --  100* 100*  CO2 28  --  27 25  GLUCOSE 160*  --  134* 105*  BUN 11  --  14 17  CREATININE 0.82  --  1.02* 1.40*  CALCIUM 8.6*  --  8.6* 8.6*  MG  --  1.5* 1.6* 1.7   Liver Function Tests:  Recent Labs Lab 10/07/16 0213  AST 15  ALT 11*  ALKPHOS 55  BILITOT 1.3*  PROT 6.1*  ALBUMIN 2.9*   No results for input(s): LIPASE, AMYLASE in the last 168 hours. No results for input(s): AMMONIA in the last 168 hours. Coagulation Profile: No results for input(s): INR, PROTIME in the last 168 hours. CBC:  Recent Labs Lab 10/07/16 0213  WBC 7.5  NEUTROABS 3.8  HGB 14.7  HCT 45.2  MCV 96.0  PLT 224   Cardiac Enzymes: No results for input(s): CKTOTAL, CKMB, CKMBINDEX, TROPONINI in the last 168 hours. BNP: Invalid input(s): POCBNP CBG:  Recent Labs Lab 10/08/16 1613 10/08/16 2105 10/09/16 0740 10/09/16 1124 10/09/16 1651  GLUCAP 130* 139* 121* 134* 119*   HbA1C:  Recent Labs  10/07/16 0834  HGBA1C 9.9*   Urine analysis:    Component Value Date/Time   COLORURINE YELLOW 06/19/2014 0846   APPEARANCEUR TURBID (A) 06/19/2014 0846   LABSPEC 1.029 06/19/2014 0846   PHURINE 5.5 06/19/2014 0846   GLUCOSEU >1000 (A) 06/19/2014 0846   HGBUR MODERATE (A) 06/19/2014 0846   BILIRUBINUR NEGATIVE 06/19/2014 0846   KETONESUR NEGATIVE 06/19/2014 0846   PROTEINUR 30 (A) 06/19/2014 0846   UROBILINOGEN 0.2 06/19/2014 0846   NITRITE POSITIVE (A) 06/19/2014 0846   LEUKOCYTESUR LARGE (A) 06/19/2014 0846   Sepsis Labs: @LABRCNTIP (procalcitonin:4,lacticidven:4) )No results found for this or any previous visit (from the past 240 hour(s)).   Scheduled Meds: . aspirin EC  325 mg Oral Daily  . atorvastatin  20 mg Oral Daily   . citalopram  20 mg Oral Daily  . enoxaparin (LOVENOX) injection  40 mg Subcutaneous Q24H  . fenofibrate  160 mg Oral Daily  . [START ON 10/10/2016] furosemide  40 mg Intravenous Daily  . insulin aspart  0-15 Units Subcutaneous TID WC  . insulin aspart  0-5 Units Subcutaneous QHS  . levothyroxine  50 mcg Oral QAC breakfast  . metoprolol tartrate  12.5 mg Oral BID  . niacin  500 mg Oral Daily  . sodium chloride flush  3 mL Intravenous Q12H   Continuous Infusions: . sodium chloride      Procedures/Studies: Dg Chest 2 View  Result Date: 10/07/2016 CLINICAL DATA:  Bilateral leg swelling, nonproductive cough since July. EXAM: CHEST  2 VIEW COMPARISON:  Chest radiograph September 17, 2016 FINDINGS: Cardiac silhouette is similarly enlarged, mediastinal silhouette is nonsuspicious considering AP technique. Pulmonary vascular congestion and  mild interstitial prominence. Small pleural effusions are unchanged. Persistently elevated RIGHT hemidiaphragm. No pneumothorax. Soft tissue planes and included osseous structures are unchanged. IMPRESSION: Stable cardiomegaly. Interstitial prominence favoring pulmonary edema with small pleural effusions. Electronically Signed   By: Elon Alas M.D.   On: 10/07/2016 05:40    Mamye Bolds, DO  [facility] Pager [phone]  If 7PM-7AM, please contact night-coverage [email or web address] Password [email or web address] 10/09/2016, 5:03 PM   LOS: 2 days

## 2016-10-10 ENCOUNTER — Inpatient Hospital Stay (HOSPITAL_COMMUNITY): Payer: Medicaid Other

## 2016-10-10 ENCOUNTER — Encounter (HOSPITAL_COMMUNITY): Payer: Self-pay | Admitting: Gastroenterology

## 2016-10-10 DIAGNOSIS — I429 Cardiomyopathy, unspecified: Secondary | ICD-10-CM

## 2016-10-10 DIAGNOSIS — I10 Essential (primary) hypertension: Secondary | ICD-10-CM

## 2016-10-10 DIAGNOSIS — I25118 Atherosclerotic heart disease of native coronary artery with other forms of angina pectoris: Secondary | ICD-10-CM

## 2016-10-10 DIAGNOSIS — E784 Other hyperlipidemia: Secondary | ICD-10-CM

## 2016-10-10 DIAGNOSIS — I519 Heart disease, unspecified: Secondary | ICD-10-CM

## 2016-10-10 DIAGNOSIS — E1165 Type 2 diabetes mellitus with hyperglycemia: Secondary | ICD-10-CM

## 2016-10-10 DIAGNOSIS — I5041 Acute combined systolic (congestive) and diastolic (congestive) heart failure: Secondary | ICD-10-CM

## 2016-10-10 DIAGNOSIS — Z8673 Personal history of transient ischemic attack (TIA), and cerebral infarction without residual deficits: Secondary | ICD-10-CM | POA: Diagnosis present

## 2016-10-10 DIAGNOSIS — E039 Hypothyroidism, unspecified: Secondary | ICD-10-CM

## 2016-10-10 DIAGNOSIS — I63511 Cerebral infarction due to unspecified occlusion or stenosis of right middle cerebral artery: Secondary | ICD-10-CM

## 2016-10-10 DIAGNOSIS — R1310 Dysphagia, unspecified: Secondary | ICD-10-CM

## 2016-10-10 LAB — GLUCOSE, CAPILLARY
GLUCOSE-CAPILLARY: 84 mg/dL (ref 65–99)
GLUCOSE-CAPILLARY: 96 mg/dL (ref 65–99)
Glucose-Capillary: 79 mg/dL (ref 65–99)
Glucose-Capillary: 81 mg/dL (ref 65–99)

## 2016-10-10 LAB — BASIC METABOLIC PANEL
Anion gap: 13 (ref 5–15)
BUN: 19 mg/dL (ref 6–20)
CALCIUM: 8.4 mg/dL — AB (ref 8.9–10.3)
CO2: 26 mmol/L (ref 22–32)
CREATININE: 1.46 mg/dL — AB (ref 0.44–1.00)
Chloride: 99 mmol/L — ABNORMAL LOW (ref 101–111)
GFR calc non Af Amer: 41 mL/min — ABNORMAL LOW (ref >60)
GFR, EST AFRICAN AMERICAN: 47 mL/min — AB (ref >60)
Glucose, Bld: 81 mg/dL (ref 65–99)
Potassium: 3.5 mmol/L (ref 3.5–5.1)
Sodium: 138 mmol/L (ref 135–145)

## 2016-10-10 LAB — MAGNESIUM: Magnesium: 1.8 mg/dL (ref 1.7–2.4)

## 2016-10-10 MED ORDER — MAGNESIUM SULFATE 2 GM/50ML IV SOLN
2.0000 g | Freq: Once | INTRAVENOUS | Status: AC
  Administered 2016-10-10: 2 g via INTRAVENOUS
  Filled 2016-10-10: qty 50

## 2016-10-10 MED ORDER — ATORVASTATIN CALCIUM 40 MG PO TABS
80.0000 mg | ORAL_TABLET | Freq: Every day | ORAL | Status: DC
  Administered 2016-10-10 – 2016-10-13 (×4): 80 mg via ORAL
  Filled 2016-10-10 (×4): qty 2

## 2016-10-10 MED ORDER — GADOBENATE DIMEGLUMINE 529 MG/ML IV SOLN
10.0000 mL | Freq: Once | INTRAVENOUS | Status: AC | PRN
  Administered 2016-10-10: 10 mL via INTRAVENOUS

## 2016-10-10 MED ORDER — ASPIRIN EC 325 MG PO TBEC
325.0000 mg | DELAYED_RELEASE_TABLET | Freq: Once | ORAL | Status: AC
  Administered 2016-10-10: 325 mg via ORAL
  Filled 2016-10-10: qty 1

## 2016-10-10 MED ORDER — FUROSEMIDE 40 MG PO TABS: 40.0000 mg | ORAL_TABLET | Freq: Every day | ORAL | Status: DC

## 2016-10-10 MED ORDER — CLOPIDOGREL BISULFATE 75 MG PO TABS
75.0000 mg | ORAL_TABLET | Freq: Every day | ORAL | Status: DC
  Administered 2016-10-11 – 2016-10-14 (×4): 75 mg via ORAL
  Filled 2016-10-10 (×4): qty 1

## 2016-10-10 MED ORDER — FUROSEMIDE 10 MG/ML IJ SOLN
40.0000 mg | Freq: Two times a day (BID) | INTRAMUSCULAR | Status: DC
  Administered 2016-10-10 – 2016-10-14 (×8): 40 mg via INTRAVENOUS
  Filled 2016-10-10 (×8): qty 4

## 2016-10-10 MED ORDER — PANTOPRAZOLE SODIUM 40 MG PO TBEC
40.0000 mg | DELAYED_RELEASE_TABLET | Freq: Every day | ORAL | Status: DC
  Administered 2016-10-10 – 2016-10-14 (×4): 40 mg via ORAL
  Filled 2016-10-10 (×5): qty 1

## 2016-10-10 MED ORDER — POTASSIUM CHLORIDE CRYS ER 20 MEQ PO TBCR
20.0000 meq | EXTENDED_RELEASE_TABLET | Freq: Two times a day (BID) | ORAL | Status: DC
  Administered 2016-10-10 – 2016-10-12 (×5): 20 meq via ORAL
  Filled 2016-10-10 (×5): qty 1

## 2016-10-10 MED ORDER — LIVING WELL WITH DIABETES BOOK
Freq: Once | Status: AC
  Administered 2016-10-11: 12:00:00
  Filled 2016-10-10: qty 1

## 2016-10-10 NOTE — Evaluation (Signed)
Occupational Therapy Evaluation Patient Details Name: Monica Carlson MRN: [id number] DOB: [birth year] Today's Date: 10/10/2016    History of Present Illness Monica Carlson is a 51 y.o. female with medical history of essential hypertension, diabetes mellitus, coronary artery disease, and CHF presented with 1-2 week history of shortness of breath, leg edema, and dyspnea on exertion. The patient states that she was admitted to [facility] the third week of July. She states that she was discharged with a fluid pill with which she has been compliant. However, in the past week she states that she has not been urinating as much. Therefore, she has been checking more fluid in hopes to make herself urinate more. She has had difficulty laying flat. For the past month, she has had to sleep sitting up in a recliner. She denies any fevers, chills, headache, neck pain, chest pain, nausea, vomiting, diarrhea, dysuria. She states that she does not have a scale to weigh herself.   Clinical Impression   Patient is in bed and agreeable to complete OT evaluation. Patient presents with mild LUE weakness although has intact gross and fine motor coordination. Patient would benefit from [facility] services at discharge to work on mentioned deficits.     Follow Up Recommendations  Home health OT    Equipment Recommendations  None recommended by OT       Precautions / Restrictions Precautions Precautions: Fall Restrictions Weight Bearing Restrictions: No      Mobility Bed Mobility Overal bed mobility: Needs Assistance Bed Mobility: Supine to Sit;Sit to Supine     Supine to sit: Min guard Sit to supine: Min guard      Transfers Overall transfer level: Needs assistance Equipment used: 1 person hand held assist Transfers: Sit to/from Stand Sit to Stand: Min guard              Balance Overall balance assessment: Needs assistance Sitting-balance support: Feet supported Sitting balance-Leahy Scale: Fair      Standing balance support: Bilateral upper extremity supported Standing balance-Leahy Scale: Fair                             ADL either performed or assessed with clinical judgement   ADL Overall ADL's : Modified independent;At baseline                         Vision Baseline Vision/History: Wears glasses Wears Glasses: Reading only Patient Visual Report: Other (comment) (low vision in left eye. almost completely blind. Right eye is beginning to decrease as well) Vision Assessment?: No apparent visual deficits            Pertinent Vitals/Pain Pain Assessment: 0-10 Pain Score: 5  Pain Location: left ankle when walking, 10/10 at worst, 3/10 when not weight bearing Pain Descriptors / Indicators: Sharp Pain Intervention(s): Limited activity within patient's tolerance     Hand Dominance Right   Extremity/Trunk Assessment Upper Extremity Assessment Upper Extremity Assessment: LUE deficits/detail LUE Deficits / Details: Slight weakness in left shoulder. 4-/5 shoulder flexion and abduction. 4/5 internal and external rotation. Functional gross grasp.   Lower Extremity Assessment Lower Extremity Assessment: Defer to PT evaluation   Cervical / Trunk Assessment Cervical / Trunk Assessment: Normal   Communication Communication Communication: No difficulties   Cognition Arousal/Alertness: Awake/alert Behavior During Therapy: WFL for tasks assessed/performed Overall Cognitive Status: Within Functional Limits for tasks assessed  Home Living Family/patient expects to be discharged to:: Private residence Living Arrangements: Alone Available Help at Discharge: Neighbor Type of Home: House Home Access: Stairs to enter Technical brewer of Steps: 2 Entrance Stairs-Rails: None Home Layout: One level     Bathroom Shower/Tub: Teacher, early years/pre: Standard Bathroom  Accessibility: Yes   Home Equipment: None          Prior Functioning/Environment Level of Independence: Independent                          OT Goals(Current goals can be found in the care plan section) Acute Rehab OT Goals Patient Stated Goal: Return home with assistance  OT Frequency:      AM-PAC PT "6 Clicks" Daily Activity     Outcome Measure Help from another person eating meals?: None Help from another person taking care of personal grooming?: None Help from another person toileting, which includes using toliet, bedpan, or urinal?: None Help from another person bathing (including washing, rinsing, drying)?: None Help from another person to put on and taking off regular upper body clothing?: None Help from another person to put on and taking off regular lower body clothing?: A Little 6 Click Score: 23   End of Session    Activity Tolerance: Patient tolerated treatment well Patient left: in bed;with bed alarm set  OT Visit Diagnosis: Muscle weakness (generalized) (M62.81)                Time: 1715-1730 OT Time Calculation (min): 15 min Charges:  OT General Charges $OT Visit: 1 Procedure OT Evaluation $OT Eval Low Complexity: 1 Procedure G-Codes: OT G-codes **NOT FOR INPATIENT CLASS** Functional Assessment Tool Used: AM-PAC 6 Clicks Daily Activity Functional Limitation: Self care Self Care Current Status (E9407): At least 1 percent but less than 20 percent impaired, limited or restricted Self Care Goal Status (W8088): At least 1 percent but less than 20 percent impaired, limited or restricted Self Care Discharge Status [phone]): At least 1 percent but less than 20 percent impaired, limited or restricted   Ailene Ravel, OTR/L,CBIS  [phone]   [name], Clarene Duke 10/10/2016, 5:31 PM

## 2016-10-10 NOTE — Consult Note (Signed)
Referring Provider: Dr. Carles Collet  Primary Care Physician:  [facility], [facility] Primary Gastroenterologist:  Dr. Gala Romney   Date of Admission: 10/07/16 Date of Consultation: 10/10/16  Reason for Consultation: dysphagia   HPI:  Monica Carlson is a 51 y.o. year old female admitted with acute on chronic heart failure, acute kidney injury, symptoms of stroke on 8/19 but did not inform health care team at time of symptoms and was found to have new small right MCA stroke. GI consulted due to dysphagia and odynophagia. Evaluated by Speech Pathology today with bedside swallow evaluation noting intact oropharyngeal swallowing phases, complaints of globus sensaton, felt to havee primary esophageal dysphagia. BPE then completed with esophageal dysmotility and no stricture noted. Tablet passed freely into stomach.   Poor historian. Since July 1st states she has had issues with nausea and vomiting, lasting about 2-3 days. Was unable to eat in the mornings without vomiting. Since then, every morning will get up and eat a small amount and feel full. Denies solid food or liquid dysphagia initially, then states she feels like water is hung up in her throat. Globus sensation.  No abdominal pain. Nausea improved. Vomited after swallowing a pill this morning. Sucking on a peppermint makes her feel better. No typical reflux symptoms. States before when she has felt like this, she felt better on reflux medication.   No prior colonoscopy or EGD.   Past Medical History:  Diagnosis Date  . Cervical cancer ([place])   . CHF (congestive heart failure) ([place]) 07-2014  . Diabetes mellitus without complication ([place]) 3419  . High cholesterol   . Hypertension   . MI (myocardial infarction) ([place])    2004  . Stroke [facility]) 09/2014, 2013    Past Surgical History:  Procedure Laterality Date  . CHOLECYSTECTOMY  1992  . CORONARY STENT PLACEMENT  2004  . TUBAL LIGATION      Prior to Admission medications   Medication Sig Start  Date End Date Taking? Authorizing Provider  atorvastatin (LIPITOR) 20 MG tablet TAKE 1 TABLET BY MOUTH EVERY DAY 01/27/16  Yes Soyla Dryer, PA-C  fenofibrate (TRICOR) 145 MG tablet Take 1 tablet (145 mg total) by mouth daily. 12/24/15  Yes Soyla Dryer, PA-C  glimepiride (AMARYL) 4 MG tablet Take 1 tablet (4 mg total) by mouth daily before breakfast. 12/24/15  Yes Soyla Dryer, PA-C  lisinopril (PRINIVIL,ZESTRIL) 40 MG tablet TAKE ONE TABLET BY MOUTH EVERY DAY FOR BLOOD PRESSURE 01/27/16  Yes Soyla Dryer, PA-C  metFORMIN (GLUCOPHAGE) 1000 MG tablet TAKE ONE TABLET BY MOUTH TWICE DAILY 01/27/16  Yes Soyla Dryer, PA-C  metoprolol (LOPRESSOR) 50 MG tablet TAKE ONE TABLET BY MOUTH TWICE DAILY FOR BLOOD PRESSURE 03/30/16  Yes Soyla Dryer, PA-C  niacin 500 MG tablet Take 500 mg by mouth daily.   Yes [provider]  SYNTHROID 25 MCG tablet TAKE 1/2 TABLET BY MOUTH EVERY DAY FOR THYROID 03/30/16  Yes Soyla Dryer, PA-C  citalopram (CELEXA) 20 MG tablet TAKE ONE TABLET BY MOUTH ONCE DAILY Patient not taking: Reported on 10/07/2016 01/27/16   Soyla Dryer, PA-C  nitroGLYCERIN (NITROSTAT) 0.4 MG SL tablet Place 1 tablet (0.4 mg total) under the tongue every 5 (five) minutes as needed for chest pain. 12/24/15   Soyla Dryer, PA-C    Current Facility-Administered Medications  Medication Dose Route Frequency Provider Last Rate Last Dose  . 0.9 %  sodium chloride infusion  250 mL Intravenous PRN Tat, Shanon Brow, MD      . acetaminophen (TYLENOL)  tablet 650 mg  650 mg Oral Q4H PRN Orson Eva, MD   650 mg at 10/07/16 1109  . aspirin EC tablet 325 mg  325 mg Oral Daily Tat, Shanon Brow, MD   325 mg at 10/09/16 0847  . aspirin EC tablet 325 mg  325 mg Oral Once Tat, David, MD      . atorvastatin (LIPITOR) tablet 80 mg  80 mg Oral q1800 Tat, David, MD      . citalopram (CELEXA) tablet 20 mg  20 mg Oral Daily Tat, David, MD   20 mg at 10/09/16 0846  . enoxaparin (LOVENOX) injection 40 mg   40 mg Subcutaneous Q24H Tat, Shanon Brow, MD   40 mg at 10/10/16 1116  . fenofibrate tablet 160 mg  160 mg Oral Daily Tat, David, MD   160 mg at 10/09/16 0846  . furosemide (LASIX) injection 40 mg  40 mg Intravenous BID Lendon Colonel, NP      . insulin aspart (novoLOG) injection 0-15 Units  0-15 Units Subcutaneous TID Carlyn Reichert Orson Eva, MD   2 Units at 10/09/16 1339  . insulin aspart (novoLOG) injection 0-5 Units  0-5 Units Subcutaneous QHS Tat, David, MD      . levothyroxine (SYNTHROID, LEVOTHROID) tablet 50 mcg  50 mcg Oral QAC breakfast Tat, Shanon Brow, MD   50 mcg at 10/09/16 0846  . living well with diabetes book MISC   Does not apply Once Tat, David, MD      . niacin tablet 500 mg  500 mg Oral Daily Tat, David, MD   500 mg at 10/09/16 1635  . ondansetron (ZOFRAN) injection 4 mg  4 mg Intravenous Q6H PRN Orson Eva, MD   4 mg at 10/09/16 2040  . potassium chloride SA (K-DUR,KLOR-CON) CR tablet 20 mEq  20 mEq Oral BID Lendon Colonel, NP   20 mEq at 10/10/16 1116  . sodium chloride flush (NS) 0.9 % injection 3 mL  3 mL Intravenous Therisa Doyne, MD   3 mL at 10/10/16 1116  . sodium chloride flush (NS) 0.9 % injection 3 mL  3 mL Intravenous PRN Orson Eva, MD   3 mL at 10/07/16 2152    Allergies as of 10/07/2016  . (No Known Allergies)    Family History  Problem Relation Age of Onset  . Migraines Mother   . Heart disease Father   . Hypertension Father   . Diabetes Father   . Migraines Maternal Grandfather   . Heart attack Maternal Grandfather   . Colon cancer Neg Hx   . Colon polyps Neg Hx     Social History   Social History  . Marital status: Married    Spouse name: N/A  . Number of children: N/A  . Years of education: N/A   Occupational History  . Not on file.   Social History Main Topics  . Smoking status: Never Smoker  . Smokeless tobacco: Never Used  . Alcohol use No  . Drug use: No  . Sexual activity: Yes    Birth control/ protection: None, Surgical   Other  Topics Concern  . Not on file   Social History Narrative  . No narrative on file    Review of Systems: Gen: Denies fever, chills, loss of appetite, change in weight or weight loss CV: Denies chest pain, heart palpitations, syncope, edema  Resp: Denies shortness of breath with rest, cough, wheezing GI: see HPI  GU : Denies urinary burning, urinary frequency, urinary  incontinence.  MS: Denies joint pain,swelling, cramping Derm: Denies rash, itching, dry skin Psych: Denies depression, anxiety,confusion, or memory loss Heme: see HPI   Physical Exam: Vital signs in last 24 hours: Temp:  [97.4 F (36.3 C)-98.3 F (36.8 C)] 97.8 F (36.6 C) (08/20 1500) Pulse Rate:  [58-85] 70 (08/20 1500) Resp:  [16-20] 18 (08/20 1500) BP: (92-146)/(40-91) 135/78 (08/20 1500) SpO2:  [90 %-95 %] 93 % (08/20 1500) Weight:  [217 lb 13 oz (98.8 kg)] 217 lb 13 oz (98.8 kg) (08/20 0625) Last BM Date: 10/08/16 General:   Alert,  Well-developed, well-nourished, pleasant and cooperative in NAD Head:  Normocephalic and atraumatic. Eyes:  Sclera clear, no icterus.   Conjunctiva pink. Ears:  Normal auditory acuity. Nose:  No deformity, discharge,  or lesions. Mouth:  No deformity or lesions, dentition normal. Lungs:  Clear throughout to auscultation.    Heart:  S1 S2 present  Abdomen:  Soft, nontender and nondistended. No masses, hepatosplenomegaly or hernias noted. Normal bowel sounds, without guarding, and without rebound.   Rectal:  Deferred until time of colonoscopy.   Msk:  Symmetrical without gross deformities. Normal posture. Extremities:  Without  Edema.  Neurologic:  Alert and  oriented x4 Psych:  Alert and cooperative. Normal mood and affect.  Intake/Output from previous day: 08/19 0701 - 08/20 0700 In: 360 [P.O.:360] Out: 300 [Urine:300] Intake/Output this shift: Total I/O In: 50 [IV Piggyback:50] Out: -   Lab Results: Lab Results  Component Value Date   WBC 7.5 10/07/2016   HGB  14.7 10/07/2016   HCT 45.2 10/07/2016   MCV 96.0 10/07/2016   PLT 224 10/07/2016   BMET  Recent Labs  10/08/16 0628 10/09/16 0614 10/10/16 0603  NA 136 135 138  K 3.8 3.6 3.5  CL 100* 100* 99*  CO2 27 25 26   GLUCOSE 134* 105* 81  BUN 14 17 19   CREATININE 1.02* 1.40* 1.46*  CALCIUM 8.6* 8.6* 8.4*   Studies/Results: Ct Head Wo Contrast  Result Date: 10/09/2016 CLINICAL DATA:  LEFT extremity weakness beginning earlier today. Assess stroke. History of hypertension, hypercholesterolemia and diabetes. EXAM: CT HEAD WITHOUT CONTRAST TECHNIQUE: Contiguous axial images were obtained from the base of the skull through the vertex without intravenous contrast. COMPARISON:  MRI of the head October 03, 2014 FINDINGS: BRAIN: Faint blurring of the RIGHT temporal gray-white matter differentiation with subtle RIGHT insular ribbon sign. Old bilateral basal ganglia and infarcts. Old small LEFT occipital lobe infarct. Patchy pontine hypodensity (axial 8/31). Old LEFT thalamus lacunar infarct. Focal RIGHT frontal gliosis is unchanged is mild ex vacuo dilatation RIGHT frontal horn of lateral ventricle. No hydrocephalus. No intraparenchymal hemorrhage, mass effect or midline shift. No abnormal extra-axial fluid collections. VASCULAR: Moderate calcific atherosclerosis the carotid siphons. SKULL/SOFT TISSUES: No skull fracture. No significant soft tissue swelling. ORBITS/SINUSES: The included ocular globes and orbital contents are normal.The mastoid aircells and included paranasal sinuses are well-aerated. OTHER: None. IMPRESSION: 1. Acute suspected small RIGHT MCA territory infarct. 2. Acute pontine infarct versus artifact. 3. Old bilateral basal ganglia, LEFT thalamus infarcts and old small LEFT occipital lobe/PCA territory infarct. 4. Moderate atherosclerosis, advanced for age. These results will be called to the ordering clinician or representative by the Radiologist Assistant, and communication documented in the  PACS or zVision Dashboard. Electronically Signed   By: Elon Alas M.D.   On: 10/09/2016 22:21   Mr Jodene Nam Head Wo Contrast  Result Date: 10/10/2016 CLINICAL DATA:  51 year old female with left side weakness and  plain head CT findings suspicious for an acute right MCA territory infarct. EXAM: MRI HEAD WITHOUT AND WITH CONTRAST MRA HEAD WITHOUT CONTRAST TECHNIQUE: Multiplanar, multiecho pulse sequences of the brain and surrounding structures were obtained without and with intravenous contrast. Angiographic images of the head were obtained using MRA technique without contrast. CONTRAST:  70mL MULTIHANCE GADOBENATE DIMEGLUMINE 529 MG/ML IV SOLN COMPARISON:  Head CT without contrast 10/09/2016. [facility] brain MRI, head and neck MRA 10/03/2014. FINDINGS: MRI HEAD FINDINGS Brain: Multiple chronic lacunar type infarcts in the right basal ganglia, bilateral thalami, left corona radiata and left lentiform. Some associated gliosis and Wallerian degeneration. Chronic cortical encephalomalacia in the inferior left occipital pole. There is patchy diffusion abnormality along the posterior right corona radiata which appears mildly restricted (series 3, image 82 and series 4, image 31) with no significant T2 or FLAIR hyperintensity. There is a 9 mm area of cortically based more severely restricted diffusion in the left parietal lobe (series 3, image 85) with mild T2 and FLAIR hyperintensity. No other restricted diffusion. No associated hemorrhage or mass effect. No definite chronic cerebral blood products. Cerebral volume is not significantly changed since 2016. No evidence of mass lesion, ventriculomegaly, extra-axial collection or acute intracranial hemorrhage. Cerebellum is within normal limits. Cervicomedullary junction and pituitary are within normal limits. No abnormal enhancement identified. Vascular: Major intracranial vascular flow voids are stable since 2016. Skull and upper cervical spine: Stable  and negative. Sinuses/Orbits: Stable and negative. Other: Scalp and face soft tissues appear negative. MRA HEAD FINDINGS The IV MultiHance contrast was accidentally administered prior to the MRA, which results in venous vascular contamination on the MRA images. Chronic dominant distal left vertebral artery with bilateral distal vertebral irregularity and stenosis, worse on the right. Both distal vertebral arteries appear patent to the vertebrobasilar junction and stable compared to 2016. Stable basilar artery without significant stenosis. SCA and PCA origins remain patent. There is chronic left P1 segment stenosis. Grossly stable bilateral PCA branches. Preserved antegrade flow signal in both ICA siphons but chronic siphon irregularity and stenosis worse on the right. The siphons appears stable since 2016. Carotid termini remain patent. Stable MCA and left ACA origins. The right A1 segment is diminutive or absent. Anterior communicating artery and visible ACA branches are stable and within normal limits. MCA M1 segments are stable with mild irregularity. Both MCA by/trifurcations are patent. Stable visible bilateral MCA branches, with moderate irregularity. No MCA branch occlusion identified. IMPRESSION: 1. There is a small acute infarct in the left parietal cortex, and also a small subacute infarct in the right corona radiata. No associated hemorrhage or mass effect. 2. Underlying chronically advanced ischemic disease, with severe chronic small vessel ischemia in and around the bilateral deep gray matter nuclei, and a chronic left PCA infarct. 3. Today's intracranial MRA is degraded by venous contamination but appears stable to a 2016 MRA, with widespread large vessel atherosclerosis and chronic stenoses of both distal vertebral arteries, the Right ICA siphon, and the Left PCA. 4. The constellation of findings suggests the ischemia in #1 is due to synchronous small vessel disease. Electronically Signed   By: Genevie Ann M.D.   On: 10/10/2016 09:45   Mr Jeri Cos OA Contrast  Result Date: 10/10/2016 CLINICAL DATA:  51 year old female with left side weakness and plain head CT findings suspicious for an acute right MCA territory infarct. EXAM: MRI HEAD WITHOUT AND WITH CONTRAST MRA HEAD WITHOUT CONTRAST TECHNIQUE: Multiplanar, multiecho pulse sequences of the brain  and surrounding structures were obtained without and with intravenous contrast. Angiographic images of the head were obtained using MRA technique without contrast. CONTRAST:  61mL MULTIHANCE GADOBENATE DIMEGLUMINE 529 MG/ML IV SOLN COMPARISON:  Head CT without contrast 10/09/2016. [facility] brain MRI, head and neck MRA 10/03/2014. FINDINGS: MRI HEAD FINDINGS Brain: Multiple chronic lacunar type infarcts in the right basal ganglia, bilateral thalami, left corona radiata and left lentiform. Some associated gliosis and Wallerian degeneration. Chronic cortical encephalomalacia in the inferior left occipital pole. There is patchy diffusion abnormality along the posterior right corona radiata which appears mildly restricted (series 3, image 82 and series 4, image 31) with no significant T2 or FLAIR hyperintensity. There is a 9 mm area of cortically based more severely restricted diffusion in the left parietal lobe (series 3, image 85) with mild T2 and FLAIR hyperintensity. No other restricted diffusion. No associated hemorrhage or mass effect. No definite chronic cerebral blood products. Cerebral volume is not significantly changed since 2016. No evidence of mass lesion, ventriculomegaly, extra-axial collection or acute intracranial hemorrhage. Cerebellum is within normal limits. Cervicomedullary junction and pituitary are within normal limits. No abnormal enhancement identified. Vascular: Major intracranial vascular flow voids are stable since 2016. Skull and upper cervical spine: Stable and negative. Sinuses/Orbits: Stable and negative. Other: Scalp and  face soft tissues appear negative. MRA HEAD FINDINGS The IV MultiHance contrast was accidentally administered prior to the MRA, which results in venous vascular contamination on the MRA images. Chronic dominant distal left vertebral artery with bilateral distal vertebral irregularity and stenosis, worse on the right. Both distal vertebral arteries appear patent to the vertebrobasilar junction and stable compared to 2016. Stable basilar artery without significant stenosis. SCA and PCA origins remain patent. There is chronic left P1 segment stenosis. Grossly stable bilateral PCA branches. Preserved antegrade flow signal in both ICA siphons but chronic siphon irregularity and stenosis worse on the right. The siphons appears stable since 2016. Carotid termini remain patent. Stable MCA and left ACA origins. The right A1 segment is diminutive or absent. Anterior communicating artery and visible ACA branches are stable and within normal limits. MCA M1 segments are stable with mild irregularity. Both MCA by/trifurcations are patent. Stable visible bilateral MCA branches, with moderate irregularity. No MCA branch occlusion identified. IMPRESSION: 1. There is a small acute infarct in the left parietal cortex, and also a small subacute infarct in the right corona radiata. No associated hemorrhage or mass effect. 2. Underlying chronically advanced ischemic disease, with severe chronic small vessel ischemia in and around the bilateral deep gray matter nuclei, and a chronic left PCA infarct. 3. Today's intracranial MRA is degraded by venous contamination but appears stable to a 2016 MRA, with widespread large vessel atherosclerosis and chronic stenoses of both distal vertebral arteries, the Right ICA siphon, and the Left PCA. 4. The constellation of findings suggests the ischemia in #1 is due to synchronous small vessel disease. Electronically Signed   By: Genevie Ann M.D.   On: 10/10/2016 09:45   Dg Esophagus  Result Date:  10/10/2016 CLINICAL DATA:  Dysphagia EXAM: ESOPHOGRAM/BARIUM SWALLOW TECHNIQUE: Single contrast examination was performed using  thin barium. FLUOROSCOPY TIME:  Fluoroscopy Time:  2 minutes 6 seconds Radiation Exposure Index (if provided by the fluoroscopic device): 39.7 mGy Number of Acquired Spot Images: 0 COMPARISON:  None. FINDINGS: Fluoroscopic evaluation of swallowing was performed. This shows disruption of primary esophageal waves with stasis. No fixed esophageal stricture, fold thickening or mass. No reflux with the  water siphon maneuver. The patient swallowed a 13 mm barium tablet which freely passed into the stomach. IMPRESSION: Esophageal dysmotility. No esophageal stricture. Electronically Signed   By: Rolm Baptise M.D.   On: 10/10/2016 16:02   US Carotid Bilateral  Result Date: 10/10/2016 CLINICAL DATA:  Left-sided weakness. Trouble ambulating. Recent stroke. EXAM: BILATERAL CAROTID DUPLEX ULTRASOUND TECHNIQUE: Pearline Cables scale imaging, color Doppler and duplex ultrasound were performed of bilateral carotid and vertebral arteries in the neck. COMPARISON:  10/06/2014 FINDINGS: Criteria: Quantification of carotid stenosis is based on velocity parameters that correlate the residual internal carotid diameter with NASCET-based stenosis levels, using the diameter of the distal internal carotid lumen as the denominator for stenosis measurement. The following velocity measurements were obtained: RIGHT ICA:  75 cm/sec CCA:  72 cm/sec SYSTOLIC ICA/CCA RATIO:  1.0 DIASTOLIC ICA/CCA RATIO:  1.5 ECA:  87 cm/sec LEFT ICA:  78 cm/sec CCA:  69 cm/sec SYSTOLIC ICA/CCA RATIO:  1.1 DIASTOLIC ICA/CCA RATIO:  1.9 ECA:  84 cm/sec RIGHT CAROTID ARTERY: Small amount of plaque at the right carotid bulb and origin of the internal carotid artery. Plaque in the proximal internal carotid artery is mildly heterogeneous. Normal waveforms and velocities in the internal carotid artery. External carotid artery is patent with normal  waveform. RIGHT VERTEBRAL ARTERY: Antegrade flow and normal waveform in the right vertebral artery. LEFT CAROTID ARTERY: Small amount of plaque at the left carotid bulb and proximal internal carotid artery. External carotid artery is patent with normal waveform. Normal waveforms and velocities in the internal carotid artery. LEFT VERTEBRAL ARTERY: Antegrade flow and normal waveform in the left vertebral artery. IMPRESSION: Small amount of plaque at the carotid bulb and proximal internal carotid arteries bilaterally. Estimated degree of stenosis in the internal carotid arteries is less than 50% bilaterally. Patent vertebral arteries with antegrade flow. Electronically Signed   By: Markus Daft M.D.   On: 10/10/2016 11:48    Impression: 51 year old female with acute on chronic heart failure, acute stroke while inpatient, and reports of globus sensation with BPE noting esophageal dysmotility. No stricture noted. Patient is a poor historian and reports vague pill dysphagia; she initially denied any solid or liquid dysphagia, then endorsed sensation of feeling as if liquids remain in her throat. No urgent need for EGD at this time in light of other multiple medical issues. Will add PPI to regimen, follow dysphagia 3 diet, and see her in follow-up as outpatient in approximately 3 months.   Plan: PPI daily Dysphagia 3 diet Will have her return as outpatient for elective evaluation Will follow with you  Annitta Needs, PhD, ANP-BC [facility]     LOS: 3 days    10/10/2016, 4:56 PM

## 2016-10-10 NOTE — Evaluation (Signed)
Physical Therapy Evaluation Patient Details Name: Monica Carlson MRN: [id number] DOB: [birth year] Today's Date: 10/10/2016   History of Present Illness  Geanette Buonocore is a 51 y.o. female with medical history of essential hypertension, diabetes mellitus, coronary artery disease, and CHF presented with 1-2 week history of shortness of breath, leg edema, and dyspnea on exertion. The patient states that she was admitted to [facility] the third week of July. She states that she was discharged with a fluid pill with which she has been compliant. However, in the past week she states that she has not been urinating as much. Therefore, she has been checking more fluid in hopes to make herself urinate more. She has had difficulty laying flat. For the past month, she has had to sleep sitting up in a recliner. She denies any fevers, chills, headache, neck pain, chest pain, nausea, vomiting, diarrhea, dysuria. She states that she does not have a scale to weigh herself.  Clinical Impression  Pt admitted with above diagnosis. Pt currently with functional limitations due to the deficits listed below (see PT Problem List). Patient demonstrates slow labored movement, fair/poor standing balance with c/o increasing pain left ankle during gait training, has to lean on nearby objects for support when not using rolling walker, limited secondary to fatigue, and left ankle pain.   Pt will benefit from skilled PT to increase their independence and safety with mobility to allow discharge to the venue listed below.      Follow Up Recommendations Home health PT    Equipment Recommendations  Rolling walker with 5" wheels    Recommendations for Other Services       Precautions / Restrictions Precautions Precautions: Fall Restrictions Weight Bearing Restrictions: No      Mobility  Bed Mobility Overal bed mobility: Needs Assistance Bed Mobility: Supine to Sit;Sit to Supine     Supine to sit: Min guard Sit to supine: Min guard      Transfers Overall transfer level: Needs assistance Equipment used: 1 person hand held assist Transfers: Sit to/from Stand Sit to Stand: Min guard            Ambulation/Gait Ambulation/Gait assistance: Min guard Ambulation Distance (Feet): 65 Feet Assistive device: Rolling walker (2 wheeled);1 person hand held assist Gait Pattern/deviations: Decreased step length - right;Decreased step length - left;Decreased stride length   Gait velocity interpretation: Below normal speed for age/gender General Gait Details: Demonstrate slow labored cadence with tendency to lean on nearby objects when not using rolling walker  Stairs            Wheelchair Mobility    Modified Rankin (Stroke Patients Only)       Balance Overall balance assessment: Needs assistance Sitting-balance support: Feet supported Sitting balance-Leahy Scale: Fair     Standing balance support: Bilateral upper extremity supported Standing balance-Leahy Scale: Fair                               Pertinent Vitals/Pain Pain Assessment: 0-10 Pain Score: 9  Pain Location: left ankle when walking, 10/10 at worst, 3/10 when not weight bearing Pain Descriptors / Indicators: [place] expects to be discharged to:: Private residence Living Arrangements: Alone Available Help at Discharge: Neighbor Type of Home: House Home Access: Stairs to enter Entrance Stairs-Rails: None Entrance Stairs-Number of Steps: 2 Home Layout: One level Home Equipment: None      Prior Function Level of Independence:  Independent               Hand Dominance        Extremity/Trunk Assessment   Upper Extremity Assessment Upper Extremity Assessment: Defer to OT evaluation    Lower Extremity Assessment Lower Extremity Assessment: Overall WFL for tasks assessed    Cervical / Trunk Assessment Cervical / Trunk Assessment: Normal  Communication   Communication: No  difficulties  Cognition Arousal/Alertness: Awake/alert Behavior During Therapy: WFL for tasks assessed/performed Overall Cognitive Status: Within Functional Limits for tasks assessed                                        General Comments      Exercises     Assessment/Plan    PT Assessment Patient needs continued PT services  PT Problem List Decreased strength;Decreased activity tolerance;Decreased mobility;Decreased balance       PT Treatment Interventions Gait training;Therapeutic exercise;Therapeutic activities    PT Goals (Current goals can be found in the Care Plan section)  Acute Rehab PT Goals Patient Stated Goal: Return home with assistance PT Goal Formulation: With patient Time For Goal Achievement: 10/17/16 Potential to Achieve Goals: Good    Frequency Min 3X/week   Barriers to discharge        Co-evaluation               AM-PAC PT "6 Clicks" Daily Activity  Outcome Measure Difficulty turning over in bed (including adjusting bedclothes, sheets and blankets)?: A Little Difficulty moving from lying on back to sitting on the side of the bed? : A Little Difficulty sitting down on and standing up from a chair with arms (e.g., wheelchair, bedside commode, etc,.)?: A Little Help needed moving to and from a bed to chair (including a wheelchair)?: A Little Help needed walking in hospital room?: A Little Help needed climbing 3-5 steps with a railing? : A Little 6 Click Score: 18    End of Session Equipment Utilized During Treatment: Gait belt Activity Tolerance: Patient limited by fatigue Patient left: in bed;with call bell/phone within reach Nurse Communication: Mobility status PT Visit Diagnosis: Other abnormalities of gait and mobility (R26.89);Unsteadiness on feet (R26.81);Muscle weakness (generalized) (M62.81)    Time: 5852-7782 PT Time Calculation (min) (ACUTE ONLY): 30 min   Charges:   PT Evaluation $PT Eval Low Complexity: 1  Low PT Treatments $Gait Training: 8-22 mins   PT G Codes:       4:32 PM, 10-28-16 Lonell Grandchild, MPT Physical Therapist with [facility] [phone] office [phone] mobile phone  Lonell Grandchild 28-Oct-2016, 4:29 PM

## 2016-10-10 NOTE — Progress Notes (Signed)
Inpatient Diabetes Program Recommendations  AACE/ADA: New Consensus Statement on Inpatient Glycemic Control (2015)  Target Ranges:  Prepandial:   less than 140 mg/dL      Peak postprandial:   less than 180 mg/dL (1-2 hours)      Critically ill patients:  140 - 180 mg/dL   Lab Results  Component Value Date   GLUCAP 81 10/10/2016   HGBA1C 9.9 (H) 10/07/2016    Review of Glycemic Control  Inpatient Diabetes Program Recommendations:   Spoke with pt by phone about A1C results 9.9 (average CBG 237 over the past 2-3 months) with them and explained what an A1C is, basic pathophysiology of DM Type 2, basic home care, basic diabetes diet nutrition principles, importance of checking CBGs and maintaining good CBG control to prevent long-term and short-term complications. Reviewed signs and symptoms of hyperglycemia and hypoglycemia and how to treat hypoglycemia at home. Also reviewed blood sugar goals at home. Patient shared she has been drinking sweet tea with regular sugar but willing to change from sugar. RNs to provide ongoing basic DM education at bedside with this patient. Have ordered educational booklet and DM videos. Patient will need on D/C: -Glucose meter kit [id number]  Thank you, Monica Carlson. Monica Simcoe, RN, MSN, CDE  Diabetes Coordinator Inpatient Glycemic Control Team Team Pager [phone] (8am-5pm) 10/10/2016 1:24 PM

## 2016-10-10 NOTE — Progress Notes (Signed)
PROGRESS NOTE  Monica Carlson OMV:[id number] DOB: 05/22/1965 DOA: 10/07/2016 PCP: Sandria Manly [place] Brief History: 51 y.o.femalewith medical history of essential hypertension, diabetes mellitus, coronary artery disease, and CHF presented with 1-2week history of shortness of breath, leg edema, and dyspnea on exertion. The patient states that she was admitted to [facility] third week of July. She states that she was discharged with a fluid pill with which she has been compliant. However, in the past week she states that she has not been urinating as much. Therefore, she has been drinkingmore fluid in hopes to make herself urinate more. She has had difficulty laying flat. For the past month, she has had to sleep sitting up in a recliner.  In the emergency department, the patient was afebrile and hemodynamically stable saturating 93% on room air. BMP and CBC were unremarkable. Hepatic enzymes were unremarkable. BNP was 944. Chest x-ray showed interstitial prominence with small bilateral pleural effusions. The patient was started on IV lasix with good clinical results.  Assessment/Plan: Acute on chronic systolic and diastolic CHF -daily weights--NEG 3 lbs since admission -now able to lay flat, but still appears hypervolemic -accurate I/O's--not accurate -fluid restrict -Echo--EF 20%, diffuse HK, grade 2 DD, significant regional wall motion abnormalities. Decreased RV function  -continue IV Lasix 40 mg bid-->qday due to rising creatinine -pt appears to have poor insight on her medical condition; poor health literacy -d/c lisinopril due to now soft BPs and increasing creatinine -decrease dose of metoprolol tartrate due to soft BPs -consult cardiology -consult neurology  Right MCA stroke -pt stated LUE & LLE weakness started in AM 8/19, but did not tell anyone -RN noted weakness on PM assessment at 2030 -8/19 CT brain--new small right MCA infarct, acute pontine  infarct -Neurology Consult -PT/OT evaluation -Speech therapy eval -MRI brain-- -MRA brain-- -Carotid Duplex-- -Echo---Echo--EF 20%, diffuse HK, grade 2 DD, significant regional wall motion abnormalities. -LDL--106 -HbA1C--9.9 -Antiplatelet--ASA 325 mg daily -increase lipitor to 80 mg daily  AKI -likely cardiorenal syndrome -decreased lasix -am BMP  Diabetes mellitus type 2, uncontrolled -10/07/16-hemoglobin A1c--9.9 -NovoLog sliding scale -Holding metformin and Amaryl -CBGs controlled during hospitalization--?compliance at home  Hyperlipidemia -Continue statin and TriCor -increase atorvastatin to 80 mg daily  Essential hypertension -now BP soft with diuresis -d/c lisinopril due to soft BPs -d/c metoprolol due to soft BPs and new stroke  Coronary artery disease with hx MI -10/01/2004for heart catheterization--stented mid RCA, EF 69% -No chest pain presently -Continue aspirin -Continue metoprolol tartrate  Hypothyroidism -Continue Synthroid--increase dose to 50 mcg daily -TSH 17.57  Hypomagnesemia -Replete    Disposition Plan: Home  When cleared by cardiology/neurology Family Communication: advocate updated at bedside 8/20  Consultants: cardiology, neurology  Code Status: FULL  DVT Prophylaxis:  Lovenox   Procedures: As Listed in Progress Note Above  Antibiotics: None   Subjective: Patient states that she developed left upper extremity and left lower extremity weakness sometime in the morning of 10/09/2016, but did not tell anybody because she felt that her symptoms would likely get better. She denies any chest pain, sinus breath, nausea, vomiting, diarrhea, headache, neck pain, abdominal pain.  Objective: Vitals:   10/10/16 0300 10/10/16 0500 10/10/16 0625 10/10/16 0700  BP: (!) 108/50 (!) 110/40  118/71  Pulse: 75 80  74  Resp: 16 20  16   Temp: (!) 97.5 F (36.4 C)   (!) 97.4 F (36.3 C)  TempSrc:  Oral  Oral   SpO2:  94% 95%  93%  Weight:   98.8 kg (217 lb 13 oz)   Height:        Intake/Output Summary (Last 24 hours) at 10/10/16 0836 Last data filed at 10/10/16 0600  Gross per 24 hour  Intake              240 ml  Output              300 ml  Net              -60 ml   Weight change: -1.1 kg (-2 lb 6.8 oz) Exam:   General:  Pt is alert, follows commands appropriately, not in acute distress  HEENT: No icterus, No thrush, No neck mass, [place]/AT  Cardiovascular: RRR, S1/S2, no rubs, no gallops  Respiratory: Bibasilar crackles.  Abdomen: Soft/+BS, non tender, non distended, no guarding  Extremities: 1 + LE edema, No lymphangitis, No petechiae, No rashes, no synovitis  Neuro: right facial droop, strength 5/5 in RUE, RLE, strength 4-/5 LUE, LLE; sensation intact bilateral; no dysmetria; babinski equivocal     Data Reviewed: I have personally reviewed following labs and imaging studies Basic Metabolic Panel:  Recent Labs Lab 10/07/16 0213 10/07/16 0834 10/08/16 0628 10/09/16 0614 10/10/16 0603  NA 136  --  136 135 138  K 3.9  --  3.8 3.6 3.5  CL 101  --  100* 100* 99*  CO2 28  --  27 25 26   GLUCOSE 160*  --  134* 105* 81  BUN 11  --  14 17 19   CREATININE 0.82  --  1.02* 1.40* 1.46*  CALCIUM 8.6*  --  8.6* 8.6* 8.4*  MG  --  1.5* 1.6* 1.7 1.8   Liver Function Tests:  Recent Labs Lab 10/07/16 0213  AST 15  ALT 11*  ALKPHOS 55  BILITOT 1.3*  PROT 6.1*  ALBUMIN 2.9*   No results for input(s): LIPASE, AMYLASE in the last 168 hours. No results for input(s): AMMONIA in the last 168 hours. Coagulation Profile: No results for input(s): INR, PROTIME in the last 168 hours. CBC:  Recent Labs Lab 10/07/16 0213  WBC 7.5  NEUTROABS 3.8  HGB 14.7  HCT 45.2  MCV 96.0  PLT 224   Cardiac Enzymes: No results for input(s): CKTOTAL, CKMB, CKMBINDEX, TROPONINI in the last 168 hours. BNP: Invalid input(s): POCBNP CBG:  Recent Labs Lab 10/08/16 2105 10/09/16 0740  10/09/16 1124 10/09/16 1651 10/09/16 2105  GLUCAP 139* 121* 134* 119* 128*   HbA1C: No results for input(s): HGBA1C in the last 72 hours. Urine analysis:    Component Value Date/Time   COLORURINE YELLOW 06/19/2014 0846   APPEARANCEUR TURBID (A) 06/19/2014 0846   LABSPEC 1.029 06/19/2014 0846   PHURINE 5.5 06/19/2014 0846   GLUCOSEU >1000 (A) 06/19/2014 0846   HGBUR MODERATE (A) 06/19/2014 0846   BILIRUBINUR NEGATIVE 06/19/2014 0846   KETONESUR NEGATIVE 06/19/2014 0846   PROTEINUR 30 (A) 06/19/2014 0846   UROBILINOGEN 0.2 06/19/2014 0846   NITRITE POSITIVE (A) 06/19/2014 0846   LEUKOCYTESUR LARGE (A) 06/19/2014 0846   Sepsis Labs: @LABRCNTIP (procalcitonin:4,lacticidven:4) )No results found for this or any previous visit (from the past 240 hour(s)).   Scheduled Meds: . aspirin EC  325 mg Oral Daily  . atorvastatin  20 mg Oral Daily  . citalopram  20 mg Oral Daily  . enoxaparin (LOVENOX) injection  40 mg Subcutaneous Q24H  . fenofibrate  160 mg Oral Daily  . insulin aspart  0-15 Units Subcutaneous TID WC  . insulin aspart  0-5 Units Subcutaneous QHS  . levothyroxine  50 mcg Oral QAC breakfast  . niacin  500 mg Oral Daily  . sodium chloride flush  3 mL Intravenous Q12H   Continuous Infusions: . sodium chloride      Procedures/Studies: Dg Chest 2 View  Result Date: 10/07/2016 CLINICAL DATA:  Bilateral leg swelling, nonproductive cough since July. EXAM: CHEST  2 VIEW COMPARISON:  Chest radiograph September 17, 2016 FINDINGS: Cardiac silhouette is similarly enlarged, mediastinal silhouette is nonsuspicious considering AP technique. Pulmonary vascular congestion and mild interstitial prominence. Small pleural effusions are unchanged. Persistently elevated RIGHT hemidiaphragm. No pneumothorax. Soft tissue planes and included osseous structures are unchanged. IMPRESSION: Stable cardiomegaly. Interstitial prominence favoring pulmonary edema with small pleural effusions. Electronically  Signed   By: Elon Alas M.D.   On: 10/07/2016 05:40   Ct Head Wo Contrast  Result Date: 10/09/2016 CLINICAL DATA:  LEFT extremity weakness beginning earlier today. Assess stroke. History of hypertension, hypercholesterolemia and diabetes. EXAM: CT HEAD WITHOUT CONTRAST TECHNIQUE: Contiguous axial images were obtained from the base of the skull through the vertex without intravenous contrast. COMPARISON:  MRI of the head October 03, 2014 FINDINGS: BRAIN: Faint blurring of the RIGHT temporal gray-white matter differentiation with subtle RIGHT insular ribbon sign. Old bilateral basal ganglia and infarcts. Old small LEFT occipital lobe infarct. Patchy pontine hypodensity (axial 8/31). Old LEFT thalamus lacunar infarct. Focal RIGHT frontal gliosis is unchanged is mild ex vacuo dilatation RIGHT frontal horn of lateral ventricle. No hydrocephalus. No intraparenchymal hemorrhage, mass effect or midline shift. No abnormal extra-axial fluid collections. VASCULAR: Moderate calcific atherosclerosis the carotid siphons. SKULL/SOFT TISSUES: No skull fracture. No significant soft tissue swelling. ORBITS/SINUSES: The included ocular globes and orbital contents are normal.The mastoid aircells and included paranasal sinuses are well-aerated. OTHER: None. IMPRESSION: 1. Acute suspected small RIGHT MCA territory infarct. 2. Acute pontine infarct versus artifact. 3. Old bilateral basal ganglia, LEFT thalamus infarcts and old small LEFT occipital lobe/PCA territory infarct. 4. Moderate atherosclerosis, advanced for age. These results will be called to the ordering clinician or representative by the Radiologist Assistant, and communication documented in the PACS or zVision Dashboard. Electronically Signed   By: Elon Alas M.D.   On: 10/09/2016 22:21    Shaniqua Guillot, DO  [facility] Pager [phone]  If 7PM-7AM, please contact night-coverage [email or web address] Password [email or web address] 10/10/2016, 8:36 AM   LOS: 3 days

## 2016-10-10 NOTE — Progress Notes (Signed)
Neuro checks performed on patient during the night.  Patient's left sided weakness decreased in her leg and patient was able to ambulate with assistance, and drift decreased in left arm also.

## 2016-10-10 NOTE — Evaluation (Signed)
Clinical/Bedside Swallow Evaluation Patient Details  Name: Monica Carlson MRN: [id number] Date of Birth: 06-Nov-1965  Today's Date: 10/10/2016 Time: SLP Start Time (ACUTE ONLY): 1336 SLP Stop Time (ACUTE ONLY): 1352 SLP Time Calculation (min) (ACUTE ONLY): 16 min  Past Medical History:  Past Medical History:  Diagnosis Date  . CHF (congestive heart failure) ([place]) 07-2014  . Diabetes mellitus without complication ([place]) 2536  . High cholesterol   . Hypertension   . MI (myocardial infarction) ([place])    2004  . Stroke [facility]) 09/2014, 2013   Past Surgical History:  Past Surgical History:  Procedure Laterality Date  . CHOLECYSTECTOMY  1992  . CORONARY STENT PLACEMENT  2004  . TUBAL LIGATION     HPI:  Pt is a 51 y.o.femalewith medical history of essential hypertension, diabetes mellitus, coronary artery disease, and CHF presented with 1-2week history of shortness of breath, leg edema, and dyspnea on exertion. MRI of brain this date revealed: a small acute infarct in the left parietal cortex, and also a small subacute infarct in the right corona radiata. Chronic left PCA infarct noted.    Assessment / Plan / Recommendation Clinical Impression  Pt presents with a suspected primary esophageal dysphagia. SLP obtained hx and throughout questioning pt states that she has experienced daily emesis, prior to PO consumption since July of this year. Oral motor exam unremarkable. Oropharyngeal phases of swallow appear intact as pt consumed thin liquids without overt s/sx of aspiration. Pt did however complain of globus sensation as she pointed towards esophagus and stated "it did not feel like it emptied". SLP attempted assessment with puree and solid consistencies however pt declined stating discomfort from PO trials not emptying. Recommend GI/esophageal workup per MD discretion. Continue clear liquid diet with medicines crushed in puree. ST to follow up.    SLP Visit Diagnosis: Dysphagia, unspecified  (R13.10)    Aspiration Risk  Mild aspiration risk    Diet Recommendation   Clear liquid diet with esophageal/GI workup per MD discretion  Medication Administration: Crushed with puree    Other  Recommendations Recommended Consults: Consider GI evaluation;Consider esophageal assessment Oral Care Recommendations: Oral care BID   Follow up Recommendations        Frequency and Duration min 1 x/week  1 week       Prognosis Prognosis for Safe Diet Advancement: Good Barriers to Reach Goals: Time post onset      Swallow Study   General Date of Onset: 10/07/16 HPI: Pt is a 51 y.o.femalewith medical history of essential hypertension, diabetes mellitus, coronary artery disease, and CHF presented with 1-2week history of shortness of breath, leg edema, and dyspnea on exertion. MRI of brain this date revealed: a small acute infarct in the left parietal cortex, and also a small subacute infarct in the right corona radiata. Chronic left PCA infarct noted.  Type of Study: Bedside Swallow Evaluation Previous Swallow Assessment: none on file  Diet Prior to this Study: Thin liquids Temperature Spikes Noted: No Respiratory Status: Room air History of Recent Intubation: No Behavior/Cognition: Alert;Cooperative Oral Cavity Assessment: Within Functional Limits Oral Cavity - Dentition: Missing dentition Vision: Functional for self-feeding Self-Feeding Abilities: Able to feed self Patient Positioning: Upright in bed Baseline Vocal Quality: Low vocal intensity Volitional Cough: Strong Volitional Swallow: Unable to elicit    Oral/Motor/Sensory Function Overall Oral Motor/Sensory Function: Within functional limits   Ice Chips Ice chips: Not tested (Pt declined stating "ice chips dry my mouth out")   Thin Liquid Thin Liquid: Within  functional limits    Nectar Thick Nectar Thick Liquid: Not tested   Honey Thick Honey Thick Liquid: Not tested   Puree Puree: Not tested (Pt declined, stating  globus sensation from liquid consumptio)   Solid   GO   Solid: Not tested (Pt declined also stating globus sensation )        Arvil Chaco MA, CCC-SLP Acute Care Speech Language Pathologist    Levi Aland 10/10/2016,2:09 PM

## 2016-10-10 NOTE — Consult Note (Signed)
Monica Hope A. Merlene Laughter, MD     [email or web address]          Monica Carlson is an 51 y.o. female.   ASSESSMENT/PLAN: 1. Bilateral moderate sized ischemic strokes: Risk factors include obesity, poorly controlled diabetes, congestive heart failure and dyslipidemia.   I suspect that this is due to intracranial occlusive disease but given the history of cardiac failure atrial fibrillation should be considered. Dual antiplatelet agents are recommended for at least 3 months. Subsequent, aspirin 325 is recommended. Given her heart condition, I think we should evaluate for atrial fibrillation. Consequently, A 30 day event monitor is suggested. Continue with risk factor reduction including blood pressure control, diabetes control, and weight reduction. I also agree with increasing the patient's statin. I will also obtain additional labs for RPR, vitamin B12 and homocystine levels.  2. Multiple chronic bilateral infarcts:  Again, 30 day cardiac event monitor is recommended.  3. Suspected untreated obstructive sleep apnea syndrome: Formal outpatient polysomnography is recommended  4. Unexplained Recurrent nausea, vomiting and dysphagia: Consider migraine equivalent as a potential etiology but this be a diagnosis of exclusion.             The patient is a 51 year old white female who presents with the dyspnea and was diagnosed as having acute congestive heart failure. Due to evaluation she was noted to have focal neurological deficit particularly with left-sided hemiparesis and imaging was obtained. CT scan showed evidence of possible stroke and MRI confirmed acute infarct although they've scan shows evidence of bilateral subacute/acute ischemic changes. There is also evidence of chronic bilateral infarcts. The patient has had some difficulty with nausea, vomiting and dysphagia over the last 6 weeks. She is recently been evaluated by gastroenterology and the workup has most been  unrevealing at this point. The patient is quite drowsy during the evaluation today. She reports that she was able to eat her food today without, nausea vomiting and dysphagia. She reports that she is doing well today. The review systems is quite limited because of the drowsiness.     GENERAL: This is an obese pain patient who is quite drowsy at this time. Patient is quite drowsy  HEENT: She has a large stocky neck and crowded posterior space. Tongue is large.  ABDOMEN: soft  EXTREMITIES: There is mild pitting edema of the feet and ankles. There is a area of erythema in the bilateral distribution above the ankles bilaterally with evidence of superficial ulcer involving the medial aspect bilaterally.  BACK: Normal  SKIN: Normal by inspection.    MENTAL STATUS: She is dozing and does not respond to verbal commands. She does open her eyes to sternal rub. She has significantly restricted affect. Speech is normal but she has a possibly of speech. She does follow commands. She is not oriented to time although she is oriented to hospital.   CRANIAL NERVES: Pupils are equal, round and reactive to light and accomodation; extra ocular movements are full, there is no significant nystagmus; visual fields are full; upper and lower facial muscles are normal in strength and symmetric, there is no flattening of the nasolabial folds; tongue is midline; uvula is midline; shoulder elevation is normal.  MOTOR: There is mild left hemiparesis graded as 4+/5 with mild left upper extremity drift and mild left leg drift. No drift in the right side. The right side shows normal tone, bulk and strength.  COORDINATION: Left finger to nose is normal, right finger to nose is normal, No rest tremor; no  intention tremor; no postural tremor; no bradykinesia.  REFLEXES: Deep tendon reflexes are symmetrical and normal. Babinski reflexes are flexor bilaterally.   SENSATION: Normal to light touch, temperature, and pinprick.   There is no visual or tactile extinction on double simultaneous stimulation.    NIH stroke scale 4.   Blood pressure 139/78, pulse 80, temperature 97.7 F (36.5 C), temperature source Oral, resp. rate 18, height '5\' 1"'  (1.549 m), weight 217 lb 13 oz (98.8 kg), SpO2 95 %.  Past Medical History:  Diagnosis Date  . Cervical cancer ([place])   . CHF (congestive heart failure) ([place]) 07-2014  . Diabetes mellitus without complication ([place]) 7035  . High cholesterol   . Hypertension   . MI (myocardial infarction) ([place])    2004  . Stroke [facility]) 09/2014, 2013    Past Surgical History:  Procedure Laterality Date  . CHOLECYSTECTOMY  1992  . CORONARY STENT PLACEMENT  2004  . TUBAL LIGATION      Family History  Problem Relation Age of Onset  . Migraines Mother   . Heart disease Father   . Hypertension Father   . Diabetes Father   . Migraines Maternal Grandfather   . Heart attack Maternal Grandfather   . Colon cancer Neg Hx   . Colon polyps Neg Hx     Social History:  reports that she has never smoked. She has never used smokeless tobacco. She reports that she does not drink alcohol or use drugs.  Allergies: No Known Allergies  Medications: Prior to Admission medications   Medication Sig Start Date End Date Taking? Authorizing Provider  atorvastatin (LIPITOR) 20 MG tablet TAKE 1 TABLET BY MOUTH EVERY DAY 01/27/16  Yes Soyla Dryer, PA-C  fenofibrate (TRICOR) 145 MG tablet Take 1 tablet (145 mg total) by mouth daily. 12/24/15  Yes Soyla Dryer, PA-C  glimepiride (AMARYL) 4 MG tablet Take 1 tablet (4 mg total) by mouth daily before breakfast. 12/24/15  Yes Soyla Dryer, PA-C  lisinopril (PRINIVIL,ZESTRIL) 40 MG tablet TAKE ONE TABLET BY MOUTH EVERY DAY FOR BLOOD PRESSURE 01/27/16  Yes Soyla Dryer, PA-C  metFORMIN (GLUCOPHAGE) 1000 MG tablet TAKE ONE TABLET BY MOUTH TWICE DAILY 01/27/16  Yes Soyla Dryer, PA-C  metoprolol (LOPRESSOR) 50 MG tablet TAKE ONE TABLET BY MOUTH  TWICE DAILY FOR BLOOD PRESSURE 03/30/16  Yes Soyla Dryer, PA-C  niacin 500 MG tablet Take 500 mg by mouth daily.   Yes [provider]  SYNTHROID 25 MCG tablet TAKE 1/2 TABLET BY MOUTH EVERY DAY FOR THYROID 03/30/16  Yes Soyla Dryer, PA-C  citalopram (CELEXA) 20 MG tablet TAKE ONE TABLET BY MOUTH ONCE DAILY Patient not taking: Reported on 10/07/2016 01/27/16   Soyla Dryer, PA-C  nitroGLYCERIN (NITROSTAT) 0.4 MG SL tablet Place 1 tablet (0.4 mg total) under the tongue every 5 (five) minutes as needed for chest pain. 12/24/15   Soyla Dryer, PA-C    Scheduled Meds: . aspirin EC  325 mg Oral Daily  . atorvastatin  80 mg Oral q1800  . citalopram  20 mg Oral Daily  . enoxaparin (LOVENOX) injection  40 mg Subcutaneous Q24H  . fenofibrate  160 mg Oral Daily  . furosemide  40 mg Intravenous BID  . insulin aspart  0-15 Units Subcutaneous TID WC  . insulin aspart  0-5 Units Subcutaneous QHS  . levothyroxine  50 mcg Oral QAC breakfast  . living well with diabetes book   Does not apply Once  . niacin  500 mg Oral Daily  .  pantoprazole  40 mg Oral Daily  . potassium chloride  20 mEq Oral BID  . sodium chloride flush  3 mL Intravenous Q12H   Continuous Infusions: . sodium chloride     PRN Meds:.sodium chloride, acetaminophen, ondansetron (ZOFRAN) IV, sodium chloride flush     Results for orders placed or performed during the hospital encounter of 10/07/16 (from the past 48 hour(s))  Glucose, capillary     Status: Abnormal   Collection Time: 10/08/16  9:05 PM  Result Value Ref Range   Glucose-Capillary 139 (H) 65 - 99 mg/dL   Comment 1 Notify RN    Comment 2 Document in Chart   Basic metabolic panel     Status: Abnormal   Collection Time: 10/09/16  6:14 AM  Result Value Ref Range   Sodium 135 135 - 145 mmol/L   Potassium 3.6 3.5 - 5.1 mmol/L   Chloride 100 (L) 101 - 111 mmol/L   CO2 25 22 - 32 mmol/L   Glucose, Bld 105 (H) 65 - 99 mg/dL   BUN 17 6 - 20 mg/dL    Creatinine, Ser 1.40 (H) 0.44 - 1.00 mg/dL   Calcium 8.6 (L) 8.9 - 10.3 mg/dL   GFR calc non Af Amer 43 (L) >60 mL/min   GFR calc Af Amer 49 (L) >60 mL/min    Comment: (NOTE) The eGFR has been calculated using the CKD EPI equation. This calculation has not been validated in all clinical situations. eGFR's persistently <60 mL/min signify possible Chronic Kidney Disease.    Anion gap 10 5 - 15  Magnesium     Status: None   Collection Time: 10/09/16  6:14 AM  Result Value Ref Range   Magnesium 1.7 1.7 - 2.4 mg/dL  Glucose, capillary     Status: Abnormal   Collection Time: 10/09/16  7:40 AM  Result Value Ref Range   Glucose-Capillary 121 (H) 65 - 99 mg/dL   Comment 1 Notify RN    Comment 2 Document in Chart   Glucose, capillary     Status: Abnormal   Collection Time: 10/09/16 11:24 AM  Result Value Ref Range   Glucose-Capillary 134 (H) 65 - 99 mg/dL   Comment 1 Notify RN    Comment 2 Document in Chart   Glucose, capillary     Status: Abnormal   Collection Time: 10/09/16  4:51 PM  Result Value Ref Range   Glucose-Capillary 119 (H) 65 - 99 mg/dL   Comment 1 Notify RN    Comment 2 Document in Chart   Glucose, capillary     Status: Abnormal   Collection Time: 10/09/16  9:05 PM  Result Value Ref Range   Glucose-Capillary 128 (H) 65 - 99 mg/dL   Comment 1 Notify RN    Comment 2 Document in Chart   Basic metabolic panel     Status: Abnormal   Collection Time: 10/10/16  6:03 AM  Result Value Ref Range   Sodium 138 135 - 145 mmol/L   Potassium 3.5 3.5 - 5.1 mmol/L   Chloride 99 (L) 101 - 111 mmol/L   CO2 26 22 - 32 mmol/L   Glucose, Bld 81 65 - 99 mg/dL   BUN 19 6 - 20 mg/dL   Creatinine, Ser 1.46 (H) 0.44 - 1.00 mg/dL   Calcium 8.4 (L) 8.9 - 10.3 mg/dL   GFR calc non Af Amer 41 (L) >60 mL/min   GFR calc Af Amer 47 (L) >60 mL/min    Comment: (  NOTE) The eGFR has been calculated using the CKD EPI equation. This calculation has not been validated in all clinical  situations. eGFR's persistently <60 mL/min signify possible Chronic Kidney Disease.    Anion gap 13 5 - 15  Magnesium     Status: None   Collection Time: 10/10/16  6:03 AM  Result Value Ref Range   Magnesium 1.8 1.7 - 2.4 mg/dL  Glucose, capillary     Status: None   Collection Time: 10/10/16  7:47 AM  Result Value Ref Range   Glucose-Capillary 79 65 - 99 mg/dL  Glucose, capillary     Status: None   Collection Time: 10/10/16 11:20 AM  Result Value Ref Range   Glucose-Capillary 81 65 - 99 mg/dL  Glucose, capillary     Status: None   Collection Time: 10/10/16  4:38 PM  Result Value Ref Range   Glucose-Capillary 84 65 - 99 mg/dL    Studies/Results:      BRAIN MRI MRA FINDINGS: MRI HEAD FINDINGS  Brain: Multiple chronic lacunar type infarcts in the right basal ganglia, bilateral thalami, left corona radiata and left lentiform. Some associated gliosis and Wallerian degeneration. Chronic cortical encephalomalacia in the inferior left occipital pole.  There is patchy diffusion abnormality along the posterior right corona radiata which appears mildly restricted (series 3, image 82 and series 4, image 31) with no significant T2 or FLAIR hyperintensity. There is a 9 mm area of cortically based more severely restricted diffusion in the left parietal lobe (series 3, image 85) with mild T2 and FLAIR hyperintensity.  No other restricted diffusion. No associated hemorrhage or mass effect. No definite chronic cerebral blood products. Cerebral volume is not significantly changed since 2016. No evidence of mass lesion, ventriculomegaly, extra-axial collection or acute intracranial hemorrhage. Cerebellum is within normal limits. Cervicomedullary junction and pituitary are within normal limits.  No abnormal enhancement identified.  Vascular: Major intracranial vascular flow voids are stable since 2016.  Skull and upper cervical spine: Stable and negative.  Sinuses/Orbits:  Stable and negative.  Other: Scalp and face soft tissues appear negative.  MRA HEAD FINDINGS  The IV MultiHance contrast was accidentally administered prior to the MRA, which results in venous vascular contamination on the MRA images.  Chronic dominant distal left vertebral artery with bilateral distal vertebral irregularity and stenosis, worse on the right. Both distal vertebral arteries appear patent to the vertebrobasilar junction and stable compared to 2016. Stable basilar artery without significant stenosis. SCA and PCA origins remain patent. There is chronic left P1 segment stenosis. Grossly stable bilateral PCA branches.  Preserved antegrade flow signal in both ICA siphons but chronic siphon irregularity and stenosis worse on the right. The siphons appears stable since 2016. Carotid termini remain patent. Stable MCA and left ACA origins. The right A1 segment is diminutive or absent. Anterior communicating artery and visible ACA branches are stable and within normal limits. MCA M1 segments are stable with mild irregularity. Both MCA by/trifurcations are patent. Stable visible bilateral MCA branches, with moderate irregularity. No MCA branch occlusion identified.  IMPRESSION: 1. There is a small acute infarct in the left parietal cortex, and also a small subacute infarct in the right corona radiata. No associated hemorrhage or mass effect. 2. Underlying chronically advanced ischemic disease, with severe chronic small vessel ischemia in and around the bilateral deep gray matter nuclei, and a chronic left PCA infarct. 3. Today's intracranial MRA is degraded by venous contamination but appears stable to a 2016 MRA, with widespread  large vessel atherosclerosis and chronic stenoses of both distal vertebral arteries, the Right ICA siphon, and the Left PCA. 4. The constellation of findings suggests the ischemia in #1 is due to synchronous small vessel  disease.     Carotid duplex Doppler shows no significant stenosis.  Barium swallow shows no strictures.    Echocardiography: - Left ventricle: The cavity size was mildly dilated. Wall   thickness was increased in a pattern of mild LVH. Systolic   function was severely reduced. The estimated ejection fraction   was 20%. Diffuse hypokinesis. Features are consistent with a   pseudonormal left ventricular filling pattern, with concomitant   abnormal relaxation and increased filling pressure (grade 2   diastolic dysfunction). Doppler parameters are consistent with   high ventricular filling pressure. - Regional wall motion abnormality: Akinesis of the mid anterior,   mid anteroseptal, basal-mid inferior, and mid anterolateral   myocardium; severe hypokinesis of the apical inferior myocardium. - Aortic valve: Trileaflet; mildly calcified leaflets. - Mitral valve: There was moderate regurgitation. - Right ventricle: Systolic function was moderately to severely   reduced. - Tricuspid valve: There was moderate regurgitation. - Pulmonary arteries: PA peak pressure: 34 mm Hg (S). - Inferior vena cava: The vessel was dilated. The respirophasic   diameter changes were blunted (< 50%), consistent with elevated   central venous pressure.     The brain MRI is reviewed in person and shows a subacute infarct on DWI involving centrum semiovale on the right side adjacent to the lateral ventricle. This is a moderate sized lesion. There is a more acute cortical base lesion involving the left occipital parietal lobe moderate in size. There are multiple moderate sized the encephalomalacia involving the basal ganglia bilaterally especially on the right side. There is also lacunar infarcts involving the thalami bilaterally. There is moderate increased signal seen on FLAIR especially involving the frontal horns of the lateral ventricle particularly on the right side.  MRA shows significant stenosis  involving the right supraclinoid ICA, right A1 segment and the right vertebral. There is also luminal irregularities of the MCAs and PCAs.    Labs a year ago: Hemoglobin A1c 12.7; total cholesterol 351, HDL 34 and triglycerides 1098    Monica Carlson A. Merlene Carlson, M.D.  Diplomate, [employer] of Psychiatry and Neurology ( Neurology). 10/10/2016, 7:17 PM

## 2016-10-10 NOTE — Consult Note (Addendum)
Cardiology Consultation:   Patient ID: Daniel Johndrow; [id number]; Feb 09, 1966   Admit date: 10/07/2016 Date of Consult: 10/10/2016  Primary Care Provider: [facility] Primary Cardiologist: Bronson Ing    Patient Profile:   Monica Carlson is a 51 y.o. female with a hx of CAD, DES to unknown artery in 2004, Hypertension, diabetes, hyperlipidemia, CVA, CHF,  the who is being seen today for the evaluation of CHF with abnormal echocardiogram reporting significantly reduced EF of 20% with Grade II diastolic dysfunction, at the request of Dr. Carles Collet.   History of Present Illness:   Ms. Bing presented to the emergency room with complaints of worsening dyspnea over the last 2 weeks, this included dyspnea on exertion and some PND. The patient was last seen in our office in May 2017 and was clinically stable. She had been admitted to [facility] in June 2016 for CHF and at that time her echocardiogram revealed normal LV systolic function.  The patient states that her symptoms actually began back in July 2018. She had had what she thought was a GI virus for a couple of days with a lot of nausea vomiting and diarrhea. After she recovered from that she began to have some fluid retention which she noticed mostly in her legs. Since July she is noted worsening fatigue, dyspnea on exertion, and fluid retention worsened up into her thighs and abdomen. She also began to have a frequent cough which was nonproductive. She denied any chest pain associated with this. She has denied any palpitations racing heart rate and dizziness. She states that she did not take one of her medications regularly because it was too big to swallow, but was compliant with all of her other medications.   On arrival to the emergency room the patient was found to be hypertensive with a blood pressure 154/109.  EKG revealing normal sinus rhythm sinus tachycardia without acute ST-T wave abnormalities). She was satting at 97%  and was afebrile.  Pertinent labs revealed elevated hemoglobin A1c of 9.9, magnesium of 1.5, TSH of 17.5. Other labs revealed sodium 136 potassium of 3.9 creatinine 0.82 with a glucose of 160. She is not found to be anemic, there was no leukocytosis. BNP 944. Chest x-ray revealed stable cardiomegaly and pulmonary edema with small bilateral pleural effusions. The patient was treated with Lasix 40 mg IV, lisinopril 20 mg by mouth, insulin, levothyroxine 12.5 mg by mouth, nitroglycerin was placed topically at 1 inch.  She has diuresed 610 cc since admission. Wt on admission 220 lbs.. Review of most recent office note in May of 2017 by Dr. Bronson Ing  weight listed at 187 pounds. Koneswaran, weight at primary care provider's office was listed at 184 pounds in November 2017.    The patient apparently had acute CVA yesterday afternoon with associated left-sided weakness. She states she felt that since she got up to use the bathroom earlier in the day when she felt her "leg dragging" and ataxia of the left arm.  She did not report this to the nursing staff until later that evening as she hoped it would get better.  The symptoms continued, and became more prominent and she got up to use the bathroom later in the evening.   A CT scan of the head was completed with suspected small right MCA infarct, acute pontine infarct vs artifact, old basal ganglia thalamus infarcts and old small left occipital lobe PCA infarct. She was not given TPA as time limit was surpassed.  She is  now being sent for brain MRI.   She has regained feeling and coordination of both her left leg and left arm, but continues to have some residual weakness. She is able to bear weight on her left leg, and is able to move her left hand and fingers at will, but does have some tingling residually.   Past Medical History:  Diagnosis Date  . CHF (congestive heart failure) ([place]) 07-2014  . Diabetes mellitus without complication ([place]) 8119  . High  cholesterol   . Hypertension   . MI (myocardial infarction) ([place])    2004  . Stroke [facility]) 09/2014, 2013    Past Surgical History:  Procedure Laterality Date  . CHOLECYSTECTOMY  1992  . CORONARY STENT PLACEMENT  2004  . TUBAL LIGATION       Home Medications:  Prior to Admission medications   Medication Sig Start Date End Date Taking? Authorizing Provider  atorvastatin (LIPITOR) 20 MG tablet TAKE 1 TABLET BY MOUTH EVERY DAY 01/27/16  Yes Soyla Dryer, PA-C  fenofibrate (TRICOR) 145 MG tablet Take 1 tablet (145 mg total) by mouth daily. 12/24/15  Yes Soyla Dryer, PA-C  glimepiride (AMARYL) 4 MG tablet Take 1 tablet (4 mg total) by mouth daily before breakfast. 12/24/15  Yes Soyla Dryer, PA-C  lisinopril (PRINIVIL,ZESTRIL) 40 MG tablet TAKE ONE TABLET BY MOUTH EVERY DAY FOR BLOOD PRESSURE 01/27/16  Yes Soyla Dryer, PA-C  metFORMIN (GLUCOPHAGE) 1000 MG tablet TAKE ONE TABLET BY MOUTH TWICE DAILY 01/27/16  Yes Soyla Dryer, PA-C  metoprolol (LOPRESSOR) 50 MG tablet TAKE ONE TABLET BY MOUTH TWICE DAILY FOR BLOOD PRESSURE 03/30/16  Yes Soyla Dryer, PA-C  niacin 500 MG tablet Take 500 mg by mouth daily.   Yes [provider]  SYNTHROID 25 MCG tablet TAKE 1/2 TABLET BY MOUTH EVERY DAY FOR THYROID 03/30/16  Yes Soyla Dryer, PA-C  citalopram (CELEXA) 20 MG tablet TAKE ONE TABLET BY MOUTH ONCE DAILY Patient not taking: Reported on 10/07/2016 01/27/16   Soyla Dryer, PA-C  nitroGLYCERIN (NITROSTAT) 0.4 MG SL tablet Place 1 tablet (0.4 mg total) under the tongue every 5 (five) minutes as needed for chest pain. 12/24/15   Soyla Dryer, PA-C    Inpatient Medications: Scheduled Meds: . aspirin EC  325 mg Oral Daily  . atorvastatin  20 mg Oral Daily  . citalopram  20 mg Oral Daily  . enoxaparin (LOVENOX) injection  40 mg Subcutaneous Q24H  . fenofibrate  160 mg Oral Daily  . insulin aspart  0-15 Units Subcutaneous TID WC  . insulin aspart  0-5 Units  Subcutaneous QHS  . levothyroxine  50 mcg Oral QAC breakfast  . niacin  500 mg Oral Daily  . sodium chloride flush  3 mL Intravenous Q12H   Continuous Infusions: . sodium chloride     PRN Meds: sodium chloride, acetaminophen, ondansetron (ZOFRAN) IV, sodium chloride flush  Allergies:   No Known Allergies  Social History:   Social History   Social History  . Marital status: Married    Spouse name: N/A  . Number of children: N/A  . Years of education: N/A   Occupational History  . Not on file.   Social History Main Topics  . Smoking status: Never Smoker  . Smokeless tobacco: Never Used  . Alcohol use No  . Drug use: No  . Sexual activity: Yes    Birth control/ protection: None, Surgical   Other Topics Concern  . Not on file   Social History Narrative  .  No narrative on file    Family History:    Family History  Problem Relation Age of Onset  . Migraines Mother   . Heart disease Father   . Hypertension Father   . Diabetes Father   . Migraines Maternal Grandfather   . Heart attack Maternal Grandfather      ROS:  Please see the history of present illness.  ROS All other ROS reviewed and negative.     Physical Exam/Data:   Vitals:   10/10/16 0300 10/10/16 0500 10/10/16 0625 10/10/16 0700  BP: (!) 108/50 (!) 110/40  118/71  Pulse: 75 80  74  Resp: 16 20  16   Temp: (!) 97.5 F (36.4 C)   (!) 97.4 F (36.3 C)  TempSrc:  Oral  Oral  SpO2: 94% 95%  93%  Weight:   217 lb 13 oz (98.8 kg)   Height:        Intake/Output Summary (Last 24 hours) at 10/10/16 0814 Last data filed at 10/10/16 0600  Gross per 24 hour  Intake              240 ml  Output              300 ml  Net              -60 ml   Filed Weights   10/08/16 0540 10/09/16 0629 10/10/16 0625  Weight: 219 lb 8 oz (99.6 kg) 220 lb 3.8 oz (99.9 kg) 217 lb 13 oz (98.8 kg)   Body mass index is 41.16 kg/m.  General:  Well nourished, well developed, in no acute distress HEENT: normal Lymph: no  adenopathy Neck: Mild JVD, positive HJR Endocrine:  No thryomegaly Vascular: No carotid bruits; FA pulses 2+ bilaterally without bruits  Cardiac:  normal S1, S2; RRR; no murmur. Distant heart sounds.  Lungs:   Diminished in the bases,some crackles, no wheezes, no coughing.  Ext: 2+ edema to the thighs bilaterally  Musculoskeletal:  No deformities, BUE and BLE strength normal and equal Skin: warm and dry  Neuro:  CNs 2-12 intact, some residual weakness on the left side, with tingling feeling in her fingers. She is able to move all extremities. She has some slowness of speech.  Psych:  Flat affect  EKG:  The EKG was personally reviewed and demonstrates: NSR with rate of 96 bpm   Telemetry:  Telemetry was personally reviewed and demonstrates:  NSR no evidence of atrial fib or ventricular arrhythmias   Relevant CV Studies:  Echocardiogram 10/07/2016 Left ventricle: The cavity size was mildly dilated. Wall   thickness was increased in a pattern of mild LVH. Systolic   function was severely reduced. The estimated ejection fraction   was 20%. Diffuse hypokinesis. Features are consistent with a   pseudonormal left ventricular filling pattern, with concomitant   abnormal relaxation and increased filling pressure (grade 2   diastolic dysfunction). Doppler parameters are consistent with   high ventricular filling pressure. - Regional wall motion abnormality: Akinesis of the mid anterior,   mid anteroseptal, basal-mid inferior, and mid anterolateral   myocardium; severe hypokinesis of the apical inferior myocardium. - Aortic valve: Trileaflet; mildly calcified leaflets. - Mitral valve: There was moderate regurgitation. - Right ventricle: Systolic function was moderately to severely   reduced. - Tricuspid valve: There was moderate regurgitation. - Pulmonary arteries: PA peak pressure: 34 mm Hg (S). - Inferior vena cava: The vessel was dilated. The respirophasic   diameter changes were blunted  (<  50%), consistent with elevated   central venous pressure.  Laboratory Data:  Chemistry Recent Labs Lab 10/08/16 0628 10/09/16 0614 10/10/16 0603  NA 136 135 138  K 3.8 3.6 3.5  CL 100* 100* 99*  CO2 27 25 26   GLUCOSE 134* 105* 81  BUN 14 17 19   CREATININE 1.02* 1.40* 1.46*  CALCIUM 8.6* 8.6* 8.4*  GFRNONAA >60 43* 41*  GFRAA >60 49* 47*  ANIONGAP 9 10 13      Recent Labs Lab 10/07/16 0213  PROT 6.1*  ALBUMIN 2.9*  AST 15  ALT 11*  ALKPHOS 55  BILITOT 1.3*   Hematology Recent Labs Lab 10/07/16 0213  WBC 7.5  RBC 4.71  HGB 14.7  HCT 45.2  MCV 96.0  MCH 31.2  MCHC 32.5  RDW 15.6*  PLT 224   Cardiac EnzymesNo results for input(s): TROPONINI in the last 168 hours. No results for input(s): TROPIPOC in the last 168 hours.  BNP Recent Labs Lab 10/07/16 0213  BNP 944.0*    DDimer No results for input(s): DDIMER in the last 168 hours.  Radiology/Studies:  Dg Chest 2 View  Result Date: 10/07/2016 CLINICAL DATA:  Bilateral leg swelling, nonproductive cough since July. EXAM: CHEST  2 VIEW COMPARISON:  Chest radiograph September 17, 2016 FINDINGS: Cardiac silhouette is similarly enlarged, mediastinal silhouette is nonsuspicious considering AP technique. Pulmonary vascular congestion and mild interstitial prominence. Small pleural effusions are unchanged. Persistently elevated RIGHT hemidiaphragm. No pneumothorax. Soft tissue planes and included osseous structures are unchanged. IMPRESSION: Stable cardiomegaly. Interstitial prominence favoring pulmonary edema with small pleural effusions. Electronically Signed   By: Elon Alas M.D.   On: 10/07/2016 05:40   Ct Head Wo Contrast  Result Date: 10/09/2016 CLINICAL DATA:  LEFT extremity weakness beginning earlier today. Assess stroke. History of hypertension, hypercholesterolemia and diabetes. EXAM: CT HEAD WITHOUT CONTRAST TECHNIQUE: Contiguous axial images were obtained from the base of the skull through the vertex  without intravenous contrast. COMPARISON:  MRI of the head October 03, 2014 FINDINGS: BRAIN: Faint blurring of the RIGHT temporal gray-white matter differentiation with subtle RIGHT insular ribbon sign. Old bilateral basal ganglia and infarcts. Old small LEFT occipital lobe infarct. Patchy pontine hypodensity (axial 8/31). Old LEFT thalamus lacunar infarct. Focal RIGHT frontal gliosis is unchanged is mild ex vacuo dilatation RIGHT frontal horn of lateral ventricle. No hydrocephalus. No intraparenchymal hemorrhage, mass effect or midline shift. No abnormal extra-axial fluid collections. VASCULAR: Moderate calcific atherosclerosis the carotid siphons. SKULL/SOFT TISSUES: No skull fracture. No significant soft tissue swelling. ORBITS/SINUSES: The included ocular globes and orbital contents are normal.The mastoid aircells and included paranasal sinuses are well-aerated. OTHER: None. IMPRESSION: 1. Acute suspected small RIGHT MCA territory infarct. 2. Acute pontine infarct versus artifact. 3. Old bilateral basal ganglia, LEFT thalamus infarcts and old small LEFT occipital lobe/PCA territory infarct. 4. Moderate atherosclerosis, advanced for age. These results will be called to the ordering clinician or representative by the Radiologist Assistant, and communication documented in the PACS or zVision Dashboard. Electronically Signed   By: Elon Alas M.D.   On: 10/09/2016 22:21    Assessment and Plan:   1. Acute Systolic CHF with newly diagnosed LV systolic dysfunction: Patient is recently had significant reduction in LV systolic function from normal LV systolic function to EF of 20%. The patient denied any chest pain or weakness until around July of this year. Has had progressive symptoms since. The patient normally sleeps in a recliner and has been for several years and does  not experience significant PND or orthopnea at this time, or prior to admission. Her main complaint was lower extremity edema increasing  into her thighs and abdomen.   She has had very little urine output from oral lasix. Will change to 40 mg IV BID, for more aggressive diureses. Will insert catheter temporarily due to aggressive diuresis, and continued mild weakness on the left, to avoid frequent trips to the bathroom. Will provide potassium replacement.  Will discuss with Dr. Bronson Ing need for cardiac cath. Once MRI results are available to ascertain if she had cerebral bleed, more recommendations can be made concerning cath, and possible intervention that may require antiplatelet therapy.   She is currently not on BB,which would be beneficial to assist with ventricular arrhythmia, and BP control.  Although BP is soft, it is  not optimal for current EF. Due to recent CVA, will not reduce BP any more until definitive evaluation of etiology of current event.    2. Acute Right MCA Infarct: Noted on CT of brain yesterday. MRI results are pending. As discussed above, await results to ascertain safety of cardiac cath, and need for possible antiplatelet therapy.   3. Hx of Hypertension:  Currently soft. No changes in regimen currently Consider Entresto at some point, once further work up is completed.   4. Diabetes: Per PCP team.    Signed, Jory Sims DNP, ANP AACC.   10/10/2016 8:14 AM   The patient was seen and examined, and I agree with the history, physical exam, assessment and plan as documented above, with modifications as noted below. I have also personally reviewed all relevant documentation, old records, labs, and both radiographic and cardiovascular studies. I have also independently interpreted old and new ECG's.  51 yr old woman admitted with progressive shortness of breath and leg swelling found to be in acute combined systolic and diastolic heart failure.  I evaluated her once before in the office in May 2017. She has a h/o CAD and received a bare metal stent to the mid RCA on 11/22/02 by Dr. Sabino Snipes.    She also has a h/o poorly controlled diabetes, CVA, hypertension, and hyperlipidemia.  Echocardiogram shows severely reduced LV systolic function (EF 40%, new cardiomyopathy) and grade 2 diastolic dysfunction with multiple wall motion abnormalities.  ECG shows sinus rhythm with nonspecific T wave abnormalities and nonspecific IVCD (QRS 106 ms).  Stress test from 07/21/2015 showed normal LV systolic function and prior basal inferior wall myocardial infarction. There was a small mild area of ischemia in the distal anterior/anteroapical wall and was a low risk study overall.  She says symptoms began in July. Denies chest pain.  Other labs reviewed above demonstrative of poorly controlled diabetes (A1C 9.9%) and uncontrolled hypothyroidism.  Was started on beta blockers, ACEI, and IV Lasix but then had soft BP's and developed an acute small right MCA infarct. Metoprolol and lisinopril were stopped. Creatinine also rising since admission and hospitalist reduced IV Lasix to once daily dosing.  Carotid Dopplers showed <50% stenosis bilaterally.  Brain MRA shows a small acute infarct in the left parietal cortex, and also a small subacute infarct in the right corona radiata. No associated hemorrhage or mass effect. Underlying chronically advanced ischemic disease, with severe chronic small vessel ischemia in and around the bilateral deep gray matter nuclei, and a chronic left PCA infarct.  Constellation of findings suggests the ischemia is due to synchronous small vessel disease.  Recommendations: For the time being, will continue IV Lasix 40  mg BID as she is still hypervolemic with severely reduced LVEF, with close monitoring of renal function. Would only keep urinary catheter in for 24 hrs to avoid provoking catheter-associated UTI. Continue ASA and high intensity statin. Agree with withholding beta blocker and ACEI for now. Hopefully, she will be able to tolerate either Coreg or  Toprol-XL within the next 1-2 days.  Will arrange for outpatient stress test to determine if there is an ischemic component to cardiomyopathy, as statistically, this would be the most common cause.   Kate Sable, MD, [facility]  10/10/2016 12:29 PM

## 2016-10-10 NOTE — Plan of Care (Signed)
Problem: Food- and Nutrition-Related Knowledge Deficit (NB-1.1) Goal: Nutrition education Formal process to instruct or train a patient/client in a skill or to impart knowledge to help patients/clients voluntarily manage or modify food choices and eating behavior to maintain or improve health. Outcome: Progressing Nutrition Education Note  RD consulted for nutrition education regarding acute onset CHF.  RD provided "Heart Failure Nutrition Therapy" handout to patient family member and reviewed basics with her. The patient is out of room for MRI.  Provided examples on ways to decrease sodium intake in diet.   RD discussed why it is important for patient to adhere to diet recommendations, and emphasized the role of fluids, foods to avoid, and importance of weighing self daily.  Body mass index is 41.16 kg/m. Pt meets criteria for morbid obesity based on current BMI.  Current diet order is heart healthy, patient is consuming approximately 25-50% of meals at this time. Labs and medications reviewed. RD will return to answer pt questions as time allows before her discharge.   Colman Cater MS,RD,CSG,LDN Office: [phone] Pager: [phone]

## 2016-10-11 DIAGNOSIS — I519 Heart disease, unspecified: Secondary | ICD-10-CM | POA: Diagnosis present

## 2016-10-11 DIAGNOSIS — I509 Heart failure, unspecified: Secondary | ICD-10-CM

## 2016-10-11 DIAGNOSIS — I5041 Acute combined systolic (congestive) and diastolic (congestive) heart failure: Secondary | ICD-10-CM

## 2016-10-11 DIAGNOSIS — I2510 Atherosclerotic heart disease of native coronary artery without angina pectoris: Secondary | ICD-10-CM

## 2016-10-11 DIAGNOSIS — I639 Cerebral infarction, unspecified: Secondary | ICD-10-CM | POA: Diagnosis present

## 2016-10-11 LAB — CBC
HEMATOCRIT: 42.6 % (ref 36.0–46.0)
Hemoglobin: 13.9 g/dL (ref 12.0–15.0)
MCH: 30.9 pg (ref 26.0–34.0)
MCHC: 32.6 g/dL (ref 30.0–36.0)
MCV: 94.7 fL (ref 78.0–100.0)
PLATELETS: 244 10*3/uL (ref 150–400)
RBC: 4.5 MIL/uL (ref 3.87–5.11)
RDW: 16.1 % — AB (ref 11.5–15.5)
WBC: 9.7 10*3/uL (ref 4.0–10.5)

## 2016-10-11 LAB — BASIC METABOLIC PANEL
Anion gap: 9 (ref 5–15)
BUN: 17 mg/dL (ref 6–20)
CHLORIDE: 98 mmol/L — AB (ref 101–111)
CO2: 29 mmol/L (ref 22–32)
CREATININE: 1.24 mg/dL — AB (ref 0.44–1.00)
Calcium: 8.1 mg/dL — ABNORMAL LOW (ref 8.9–10.3)
GFR calc Af Amer: 57 mL/min — ABNORMAL LOW (ref >60)
GFR calc non Af Amer: 49 mL/min — ABNORMAL LOW (ref >60)
GLUCOSE: 98 mg/dL (ref 65–99)
POTASSIUM: 3 mmol/L — AB (ref 3.5–5.1)
SODIUM: 136 mmol/L (ref 135–145)

## 2016-10-11 LAB — VITAMIN B12: Vitamin B-12: 451 pg/mL (ref 180–914)

## 2016-10-11 LAB — GLUCOSE, CAPILLARY
GLUCOSE-CAPILLARY: 96 mg/dL (ref 65–99)
GLUCOSE-CAPILLARY: 90 mg/dL (ref 65–99)
GLUCOSE-CAPILLARY: 137 mg/dL — AB (ref 65–99)
Glucose-Capillary: 182 mg/dL — ABNORMAL HIGH (ref 65–99)

## 2016-10-11 LAB — MAGNESIUM: Magnesium: 1.6 mg/dL — ABNORMAL LOW (ref 1.7–2.4)

## 2016-10-11 MED ORDER — GABAPENTIN 300 MG PO CAPS
300.0000 mg | ORAL_CAPSULE | Freq: Two times a day (BID) | ORAL | Status: DC
  Administered 2016-10-11 – 2016-10-14 (×6): 300 mg via ORAL
  Filled 2016-10-11 (×7): qty 1

## 2016-10-11 MED ORDER — POTASSIUM CHLORIDE CRYS ER 20 MEQ PO TBCR
20.0000 meq | EXTENDED_RELEASE_TABLET | Freq: Once | ORAL | Status: AC
  Administered 2016-10-11: 20 meq via ORAL
  Filled 2016-10-11: qty 1

## 2016-10-11 MED ORDER — STROKE: EARLY STAGES OF RECOVERY BOOK
Freq: Once | Status: AC
  Administered 2016-10-11: 12:00:00
  Filled 2016-10-11: qty 1

## 2016-10-11 MED ORDER — INSULIN ASPART 100 UNIT/ML ~~LOC~~ SOLN
0.0000 [IU] | Freq: Every day | SUBCUTANEOUS | Status: DC
  Administered 2016-10-12 – 2016-10-13 (×2): 2 [IU] via SUBCUTANEOUS

## 2016-10-11 MED ORDER — MAGNESIUM SULFATE 2 GM/50ML IV SOLN
2.0000 g | Freq: Once | INTRAVENOUS | Status: AC
  Administered 2016-10-11: 2 g via INTRAVENOUS
  Filled 2016-10-11: qty 50

## 2016-10-11 MED ORDER — INSULIN ASPART 100 UNIT/ML ~~LOC~~ SOLN
0.0000 [IU] | Freq: Three times a day (TID) | SUBCUTANEOUS | Status: DC
  Administered 2016-10-12: 2 [IU] via SUBCUTANEOUS
  Administered 2016-10-13: 1 [IU] via SUBCUTANEOUS
  Administered 2016-10-13 (×2): 2 [IU] via SUBCUTANEOUS
  Administered 2016-10-14: 7 [IU] via SUBCUTANEOUS

## 2016-10-11 MED ORDER — MAGNESIUM OXIDE 400 (241.3 MG) MG PO TABS
400.0000 mg | ORAL_TABLET | Freq: Every day | ORAL | Status: DC
  Administered 2016-10-11 – 2016-10-14 (×4): 400 mg via ORAL
  Filled 2016-10-11 (×4): qty 1

## 2016-10-11 NOTE — Progress Notes (Signed)
Subjective: States she did not do well with dinner but unable to report why. Denies dysphagia. No abdominal pain.   Objective: Vital signs in last 24 hours: Temp:  [95.8 F (35.4 C)-98.3 F (36.8 C)] 95.8 F (35.4 C) (08/21 0628) Pulse Rate:  [70-89] 89 (08/21 0628) Resp:  [16-20] 16 (08/21 0628) BP: (134-146)/(78-91) 134/84 (08/21 0628) SpO2:  [90 %-95 %] 90 % (08/21 0628) Weight:  [212 lb 15.4 oz (96.6 kg)] 212 lb 15.4 oz (96.6 kg) (08/21 0628) Last BM Date: 10/08/16 General:   Sleeping but easily arousable  Head:  Normocephalic and atraumatic. Abdomen:  Bowel sounds present, soft, non-tender, non-distended.  Extremities:  Without edema. Neurologic:  Alert and  oriented x4 Psych:  Alert and cooperative. Normal mood and affect.  Intake/Output from previous day: 08/20 0701 - 08/21 0700 In: 50 [IV Piggyback:50] Out: 2751 [Urine:3200; Stool:1] Intake/Output this shift: No intake/output data recorded.  Lab Results:  Recent Labs  10/11/16 0651  WBC 9.7  HGB 13.9  HCT 42.6  PLT 244   BMET  Recent Labs  10/09/16 0614 10/10/16 0603 10/11/16 0651  NA 135 138 136  K 3.6 3.5 3.0*  CL 100* 99* 98*  CO2 25 26 29   GLUCOSE 105* 81 98  BUN 17 19 17   CREATININE 1.40* 1.46* 1.24*  CALCIUM 8.6* 8.4* 8.1*     Studies/Results: Ct Head Wo Contrast  Result Date: 10/09/2016 CLINICAL DATA:  LEFT extremity weakness beginning earlier today. Assess stroke. History of hypertension, hypercholesterolemia and diabetes. EXAM: CT HEAD WITHOUT CONTRAST TECHNIQUE: Contiguous axial images were obtained from the base of the skull through the vertex without intravenous contrast. COMPARISON:  MRI of the head October 03, 2014 FINDINGS: BRAIN: Faint blurring of the RIGHT temporal gray-white matter differentiation with subtle RIGHT insular ribbon sign. Old bilateral basal ganglia and infarcts. Old small LEFT occipital lobe infarct. Patchy pontine hypodensity (axial 8/31). Old LEFT thalamus  lacunar infarct. Focal RIGHT frontal gliosis is unchanged is mild ex vacuo dilatation RIGHT frontal horn of lateral ventricle. No hydrocephalus. No intraparenchymal hemorrhage, mass effect or midline shift. No abnormal extra-axial fluid collections. VASCULAR: Moderate calcific atherosclerosis the carotid siphons. SKULL/SOFT TISSUES: No skull fracture. No significant soft tissue swelling. ORBITS/SINUSES: The included ocular globes and orbital contents are normal.The mastoid aircells and included paranasal sinuses are well-aerated. OTHER: None. IMPRESSION: 1. Acute suspected small RIGHT MCA territory infarct. 2. Acute pontine infarct versus artifact. 3. Old bilateral basal ganglia, LEFT thalamus infarcts and old small LEFT occipital lobe/PCA territory infarct. 4. Moderate atherosclerosis, advanced for age. These results will be called to the ordering clinician or representative by the Radiologist Assistant, and communication documented in the PACS or zVision Dashboard. Electronically Signed   By: Elon Alas M.D.   On: 10/09/2016 22:21   Mr Virgel Paling ZG Contrast  Result Date: 10/10/2016 CLINICAL DATA:  51 year old female with left side weakness and plain head CT findings suspicious for an acute right MCA territory infarct. EXAM: MRI HEAD WITHOUT AND WITH CONTRAST MRA HEAD WITHOUT CONTRAST TECHNIQUE: Multiplanar, multiecho pulse sequences of the brain and surrounding structures were obtained without and with intravenous contrast. Angiographic images of the head were obtained using MRA technique without contrast. CONTRAST:  32mL MULTIHANCE GADOBENATE DIMEGLUMINE 529 MG/ML IV SOLN COMPARISON:  Head CT without contrast 10/09/2016. [facility] brain MRI, head and neck MRA 10/03/2014. FINDINGS: MRI HEAD FINDINGS Brain: Multiple chronic lacunar type infarcts in the right basal ganglia, bilateral thalami, left corona radiata  and left lentiform. Some associated gliosis and Wallerian degeneration. Chronic  cortical encephalomalacia in the inferior left occipital pole. There is patchy diffusion abnormality along the posterior right corona radiata which appears mildly restricted (series 3, image 82 and series 4, image 31) with no significant T2 or FLAIR hyperintensity. There is a 9 mm area of cortically based more severely restricted diffusion in the left parietal lobe (series 3, image 85) with mild T2 and FLAIR hyperintensity. No other restricted diffusion. No associated hemorrhage or mass effect. No definite chronic cerebral blood products. Cerebral volume is not significantly changed since 2016. No evidence of mass lesion, ventriculomegaly, extra-axial collection or acute intracranial hemorrhage. Cerebellum is within normal limits. Cervicomedullary junction and pituitary are within normal limits. No abnormal enhancement identified. Vascular: Major intracranial vascular flow voids are stable since 2016. Skull and upper cervical spine: Stable and negative. Sinuses/Orbits: Stable and negative. Other: Scalp and face soft tissues appear negative. MRA HEAD FINDINGS The IV MultiHance contrast was accidentally administered prior to the MRA, which results in venous vascular contamination on the MRA images. Chronic dominant distal left vertebral artery with bilateral distal vertebral irregularity and stenosis, worse on the right. Both distal vertebral arteries appear patent to the vertebrobasilar junction and stable compared to 2016. Stable basilar artery without significant stenosis. SCA and PCA origins remain patent. There is chronic left P1 segment stenosis. Grossly stable bilateral PCA branches. Preserved antegrade flow signal in both ICA siphons but chronic siphon irregularity and stenosis worse on the right. The siphons appears stable since 2016. Carotid termini remain patent. Stable MCA and left ACA origins. The right A1 segment is diminutive or absent. Anterior communicating artery and visible ACA branches are stable  and within normal limits. MCA M1 segments are stable with mild irregularity. Both MCA by/trifurcations are patent. Stable visible bilateral MCA branches, with moderate irregularity. No MCA branch occlusion identified. IMPRESSION: 1. There is a small acute infarct in the left parietal cortex, and also a small subacute infarct in the right corona radiata. No associated hemorrhage or mass effect. 2. Underlying chronically advanced ischemic disease, with severe chronic small vessel ischemia in and around the bilateral deep gray matter nuclei, and a chronic left PCA infarct. 3. Today's intracranial MRA is degraded by venous contamination but appears stable to a 2016 MRA, with widespread large vessel atherosclerosis and chronic stenoses of both distal vertebral arteries, the Right ICA siphon, and the Left PCA. 4. The constellation of findings suggests the ischemia in #1 is due to synchronous small vessel disease. Electronically Signed   By: Genevie Ann M.D.   On: 10/10/2016 09:45   Mr Jeri Cos VE Contrast  Result Date: 10/10/2016 CLINICAL DATA:  51 year old female with left side weakness and plain head CT findings suspicious for an acute right MCA territory infarct. EXAM: MRI HEAD WITHOUT AND WITH CONTRAST MRA HEAD WITHOUT CONTRAST TECHNIQUE: Multiplanar, multiecho pulse sequences of the brain and surrounding structures were obtained without and with intravenous contrast. Angiographic images of the head were obtained using MRA technique without contrast. CONTRAST:  57mL MULTIHANCE GADOBENATE DIMEGLUMINE 529 MG/ML IV SOLN COMPARISON:  Head CT without contrast 10/09/2016. [facility] brain MRI, head and neck MRA 10/03/2014. FINDINGS: MRI HEAD FINDINGS Brain: Multiple chronic lacunar type infarcts in the right basal ganglia, bilateral thalami, left corona radiata and left lentiform. Some associated gliosis and Wallerian degeneration. Chronic cortical encephalomalacia in the inferior left occipital pole. There is  patchy diffusion abnormality along the posterior right corona radiata which  appears mildly restricted (series 3, image 82 and series 4, image 31) with no significant T2 or FLAIR hyperintensity. There is a 9 mm area of cortically based more severely restricted diffusion in the left parietal lobe (series 3, image 85) with mild T2 and FLAIR hyperintensity. No other restricted diffusion. No associated hemorrhage or mass effect. No definite chronic cerebral blood products. Cerebral volume is not significantly changed since 2016. No evidence of mass lesion, ventriculomegaly, extra-axial collection or acute intracranial hemorrhage. Cerebellum is within normal limits. Cervicomedullary junction and pituitary are within normal limits. No abnormal enhancement identified. Vascular: Major intracranial vascular flow voids are stable since 2016. Skull and upper cervical spine: Stable and negative. Sinuses/Orbits: Stable and negative. Other: Scalp and face soft tissues appear negative. MRA HEAD FINDINGS The IV MultiHance contrast was accidentally administered prior to the MRA, which results in venous vascular contamination on the MRA images. Chronic dominant distal left vertebral artery with bilateral distal vertebral irregularity and stenosis, worse on the right. Both distal vertebral arteries appear patent to the vertebrobasilar junction and stable compared to 2016. Stable basilar artery without significant stenosis. SCA and PCA origins remain patent. There is chronic left P1 segment stenosis. Grossly stable bilateral PCA branches. Preserved antegrade flow signal in both ICA siphons but chronic siphon irregularity and stenosis worse on the right. The siphons appears stable since 2016. Carotid termini remain patent. Stable MCA and left ACA origins. The right A1 segment is diminutive or absent. Anterior communicating artery and visible ACA branches are stable and within normal limits. MCA M1 segments are stable with mild  irregularity. Both MCA by/trifurcations are patent. Stable visible bilateral MCA branches, with moderate irregularity. No MCA branch occlusion identified. IMPRESSION: 1. There is a small acute infarct in the left parietal cortex, and also a small subacute infarct in the right corona radiata. No associated hemorrhage or mass effect. 2. Underlying chronically advanced ischemic disease, with severe chronic small vessel ischemia in and around the bilateral deep gray matter nuclei, and a chronic left PCA infarct. 3. Today's intracranial MRA is degraded by venous contamination but appears stable to a 2016 MRA, with widespread large vessel atherosclerosis and chronic stenoses of both distal vertebral arteries, the Right ICA siphon, and the Left PCA. 4. The constellation of findings suggests the ischemia in #1 is due to synchronous small vessel disease. Electronically Signed   By: Genevie Ann M.D.   On: 10/10/2016 09:45   Dg Esophagus  Result Date: 10/10/2016 CLINICAL DATA:  Dysphagia EXAM: ESOPHOGRAM/BARIUM SWALLOW TECHNIQUE: Single contrast examination was performed using  thin barium. FLUOROSCOPY TIME:  Fluoroscopy Time:  2 minutes 6 seconds Radiation Exposure Index (if provided by the fluoroscopic device): 39.7 mGy Number of Acquired Spot Images: 0 COMPARISON:  None. FINDINGS: Fluoroscopic evaluation of swallowing was performed. This shows disruption of primary esophageal waves with stasis. No fixed esophageal stricture, fold thickening or mass. No reflux with the water siphon maneuver. The patient swallowed a 13 mm barium tablet which freely passed into the stomach. IMPRESSION: Esophageal dysmotility. No esophageal stricture. Electronically Signed   By: Rolm Baptise M.D.   On: 10/10/2016 16:02   US Carotid Bilateral  Result Date: 10/10/2016 CLINICAL DATA:  Left-sided weakness. Trouble ambulating. Recent stroke. EXAM: BILATERAL CAROTID DUPLEX ULTRASOUND TECHNIQUE: Pearline Cables scale imaging, color Doppler and duplex  ultrasound were performed of bilateral carotid and vertebral arteries in the neck. COMPARISON:  10/06/2014 FINDINGS: Criteria: Quantification of carotid stenosis is based on velocity parameters that correlate the residual internal  carotid diameter with NASCET-based stenosis levels, using the diameter of the distal internal carotid lumen as the denominator for stenosis measurement. The following velocity measurements were obtained: RIGHT ICA:  75 cm/sec CCA:  72 cm/sec SYSTOLIC ICA/CCA RATIO:  1.0 DIASTOLIC ICA/CCA RATIO:  1.5 ECA:  87 cm/sec LEFT ICA:  78 cm/sec CCA:  69 cm/sec SYSTOLIC ICA/CCA RATIO:  1.1 DIASTOLIC ICA/CCA RATIO:  1.9 ECA:  84 cm/sec RIGHT CAROTID ARTERY: Small amount of plaque at the right carotid bulb and origin of the internal carotid artery. Plaque in the proximal internal carotid artery is mildly heterogeneous. Normal waveforms and velocities in the internal carotid artery. External carotid artery is patent with normal waveform. RIGHT VERTEBRAL ARTERY: Antegrade flow and normal waveform in the right vertebral artery. LEFT CAROTID ARTERY: Small amount of plaque at the left carotid bulb and proximal internal carotid artery. External carotid artery is patent with normal waveform. Normal waveforms and velocities in the internal carotid artery. LEFT VERTEBRAL ARTERY: Antegrade flow and normal waveform in the left vertebral artery. IMPRESSION: Small amount of plaque at the carotid bulb and proximal internal carotid arteries bilaterally. Estimated degree of stenosis in the internal carotid arteries is less than 50% bilaterally. Patent vertebral arteries with antegrade flow. Electronically Signed   By: Markus Daft M.D.   On: 10/10/2016 11:48    Assessment: 51 year old female with acute on chronic heart failure, acute stroke while inpatient, and reports of globus sensation with BPE noting esophageal dysmotility. No stricture noted. No urgent need for EGD at this time in light of other multiple  medical issues. PPI added to regimen and started on dysphagia 3 diet yesterday evening. However, she is a poor historian, and it is unclear if she tolerated this well per her report.   Will add PPI to regimen, change to dysphagia 2 (soft) diet, and see her in follow-up as outpatient in approximately 3 months.   Plan: Change to dysphagia 2 diet Will request speech to offer further dietary recommendations Protonix once each morning Outpatient evaluation to discuss need for EGD   Annitta Needs, PhD, ANP-BC [facility]    LOS: 4 days    10/11/2016, 9:15 AM

## 2016-10-11 NOTE — Care Management Note (Addendum)
Case Management Note  Patient Details  Name: Monica Carlson MRN: [id number] Date of Birth: 1965/11/12  Subjective/Objective:              Admitted with CHF and had CVA during hospital stay. Pt from home, lives alone with very supportive neighbor who is at the bedside. Pt ind pta. Has insurance, was going to the [facility] but has new pt appointment with [facility] Aug 31st. Pt will need RW and [facility] PT/OT. Pt aware medicaid will only authorize so many visits and there will be a waiting period for authorization. Pt has no preference of DME or [place] provider.       Action/Plan: Vaughan Basta, [facility] rep, aware of [place] and DME referral. Vaughan Basta will contact [facility]'s office and ask that he sign HH orders at DC, if he will not, CM will attempt to get appointment moved up. [facility] rep, will deliver DME to pt room prior to DC. Anticipate DC tomorrow, will follow.   Expected Discharge Date:     10/12/2016             Expected Discharge Plan:  [place]  In-House Referral:  NA  Discharge planning Services  CM Consult  Post Acute Care Choice:  Home Health, Durable Medical Equipment Choice offered to:  Patient  DME Arranged:  [facility] Rile DME Agency:  [place] Arranged:  PT/OT [facility] Agency:  [place]  Status of Service:  In process, will continue to follow     Durable Medical Equipment        Start     Ordered   10/11/16 1423  For home use only DME Walker rolling  Once    Question:  Patient needs a walker to treat with the following condition  Answer:  CVA (cerebral vascular accident) ([place])   10/11/16 1422       Sherald Barge, RN 10/11/2016, 2:23 PM

## 2016-10-11 NOTE — Progress Notes (Signed)
Spray A. Merlene Laughter, MD     [email or web address]          Monica Carlson is an 51 y.o. female.   ASSESSMENT/PLAN: 1. Bilateral moderate sized ischemic strokes: Risk factors include obesity, poorly controlled diabetes, congestive heart failure and dyslipidemia.   I suspect that this is due to intracranial occlusive disease but given the history of cardiac failure atrial fibrillation should be considered. Dual antiplatelet agents are recommended for at least 3 months. Subsequent, aspirin 325 is recommended. Given her heart condition, I think we should evaluate for atrial fibrillation. Consequently, A 30 day event monitor is suggested. Continue with risk factor reduction including blood pressure control, diabetes control, and weight reduction. I also agree with increasing the patient's statin. I will also obtain additional labs for RPR, vitamin B12 and homocystine levels.  2. Multiple chronic bilateral infarcts:  Again, 30 day cardiac event monitor is recommended.  3. Suspected untreated obstructive sleep apnea syndrome: Formal outpatient polysomnography is recommended  4. Unexplained Recurrent nausea, vomiting and dysphagia: Consider migraine equivalent as a potential etiology but this be a diagnosis of exclusion.        The patient is much more responsive today. She reports feeling better.         GENERAL: This is an obese pain patient who is quite drowsy at this time. Patient is quite drowsy  HEENT: She has a large stocky neck and crowded posterior space. Tongue is large.  ABDOMEN: soft  EXTREMITIES: There is mild pitting edema of the feet and ankles. There is a area of erythema in the bilateral distribution above the ankles bilaterally with evidence of superficial ulcer involving the medial aspect bilaterally.  BACK: Normal  SKIN: Normal by inspection.    MENTAL STATUS: Eyes are closed but they are easily arousable to verbal stimulus. She follows  commands briskly. Speech is slightly dysarthric but she thinks that she is at baseline. She is oriented to year, month and her situation.  CRANIAL NERVES: Pupils are equal, round and reactive to light and accomodation; extra ocular movements are full, there is no significant nystagmus; visual fields are full; upper and lower facial muscles are normal in strength and symmetric, there is no flattening of the nasolabial folds; tongue is midline; uvula is midline; shoulder elevation is normal.  MOTOR: There is mild left hemiparesis graded as 4+/5 with mild left upper extremity drift and mild left leg drift. No drift in the right side. The right side shows normal tone, bulk and strength.  COORDINATION: Left finger to nose is normal, right finger to nose is normal, No rest tremor; no intention tremor; no postural tremor; no bradykinesia.    SENSATION: Normal to light touch, temperature, and pinprick.  There is no visual or tactile extinction on double simultaneous stimulation.      Blood pressure 131/83, pulse 86, temperature 98.4 F (36.9 C), temperature source Oral, resp. rate 16, height _0  (1.549 m), weight 212 lb 15.4 oz (96.6 kg), SpO2 94 %.  Past Medical History:  Diagnosis Date  . Cervical cancer ([place])   . CHF (congestive heart failure) ([place]) 07-2014  . Diabetes mellitus without complication ([place]) 0092  . High cholesterol   . Hypertension   . MI (myocardial infarction) ([place])    2004  . Stroke [facility]) 09/2014, 2013    Past Surgical History:  Procedure Laterality Date  . CHOLECYSTECTOMY  1992  . CORONARY STENT PLACEMENT  2004  . TUBAL LIGATION  Family History  Problem Relation Age of Onset  . Migraines Mother   . Heart disease Father   . Hypertension Father   . Diabetes Father   . Migraines Maternal Grandfather   . Heart attack Maternal Grandfather   . Colon cancer Neg Hx   . Colon polyps Neg Hx     Social History:  reports that she has never smoked. She has never  used smokeless tobacco. She reports that she does not drink alcohol or use drugs.  Allergies: No Known Allergies  Medications: Prior to Admission medications   Medication Sig Start Date End Date Taking? Authorizing Provider  atorvastatin (LIPITOR) 20 MG tablet TAKE 1 TABLET BY MOUTH EVERY DAY 01/27/16  Yes Soyla Dryer, PA-C  fenofibrate (TRICOR) 145 MG tablet Take 1 tablet (145 mg total) by mouth daily. 12/24/15  Yes Soyla Dryer, PA-C  glimepiride (AMARYL) 4 MG tablet Take 1 tablet (4 mg total) by mouth daily before breakfast. 12/24/15  Yes Soyla Dryer, PA-C  lisinopril (PRINIVIL,ZESTRIL) 40 MG tablet TAKE ONE TABLET BY MOUTH EVERY DAY FOR BLOOD PRESSURE 01/27/16  Yes Soyla Dryer, PA-C  metFORMIN (GLUCOPHAGE) 1000 MG tablet TAKE ONE TABLET BY MOUTH TWICE DAILY 01/27/16  Yes Soyla Dryer, PA-C  metoprolol (LOPRESSOR) 50 MG tablet TAKE ONE TABLET BY MOUTH TWICE DAILY FOR BLOOD PRESSURE 03/30/16  Yes Soyla Dryer, PA-C  niacin 500 MG tablet Take 500 mg by mouth daily.   Yes [provider]  SYNTHROID 25 MCG tablet TAKE 1/2 TABLET BY MOUTH EVERY DAY FOR THYROID 03/30/16  Yes Soyla Dryer, PA-C  citalopram (CELEXA) 20 MG tablet TAKE ONE TABLET BY MOUTH ONCE DAILY Patient not taking: Reported on 10/07/2016 01/27/16   Soyla Dryer, PA-C  nitroGLYCERIN (NITROSTAT) 0.4 MG SL tablet Place 1 tablet (0.4 mg total) under the tongue every 5 (five) minutes as needed for chest pain. 12/24/15   Soyla Dryer, PA-C    Scheduled Meds: . aspirin EC  325 mg Oral Daily  . atorvastatin  80 mg Oral q1800  . citalopram  20 mg Oral Daily  . clopidogrel  75 mg Oral Q breakfast  . enoxaparin (LOVENOX) injection  40 mg Subcutaneous Q24H  . fenofibrate  160 mg Oral Daily  . furosemide  40 mg Intravenous BID  . gabapentin  300 mg Oral BID  . insulin aspart  0-5 Units Subcutaneous QHS  . [START ON 10/12/2016] insulin aspart  0-9 Units Subcutaneous TID WC  . levothyroxine  50 mcg  Oral QAC breakfast  . magnesium oxide  400 mg Oral Daily  . niacin  500 mg Oral Daily  . pantoprazole  40 mg Oral Daily  . potassium chloride  20 mEq Oral BID  . sodium chloride flush  3 mL Intravenous Q12H   Continuous Infusions: . sodium chloride    . magnesium sulfate 1 - 4 g bolus IVPB     PRN Meds:.sodium chloride, acetaminophen, ondansetron (ZOFRAN) IV, sodium chloride flush     Results for orders placed or performed during the hospital encounter of 10/07/16 (from the past 48 hour(s))  Glucose, capillary     Status: Abnormal   Collection Time: 10/09/16  9:05 PM  Result Value Ref Range   Glucose-Capillary 128 (H) 65 - 99 mg/dL   Comment 1 Notify RN    Comment 2 Document in Chart   Basic metabolic panel     Status: Abnormal   Collection Time: 10/10/16  6:03 AM  Result Value Ref Range  Sodium 138 135 - 145 mmol/L   Potassium 3.5 3.5 - 5.1 mmol/L   Chloride 99 (L) 101 - 111 mmol/L   CO2 26 22 - 32 mmol/L   Glucose, Bld 81 65 - 99 mg/dL   BUN 19 6 - 20 mg/dL   Creatinine, Ser 1.46 (H) 0.44 - 1.00 mg/dL   Calcium 8.4 (L) 8.9 - 10.3 mg/dL   GFR calc non Af Amer 41 (L) >60 mL/min   GFR calc Af Amer 47 (L) >60 mL/min    Comment: (NOTE) The eGFR has been calculated using the CKD EPI equation. This calculation has not been validated in all clinical situations. eGFR's persistently <60 mL/min signify possible Chronic Kidney Disease.    Anion gap 13 5 - 15  Magnesium     Status: None   Collection Time: 10/10/16  6:03 AM  Result Value Ref Range   Magnesium 1.8 1.7 - 2.4 mg/dL  Glucose, capillary     Status: None   Collection Time: 10/10/16  7:47 AM  Result Value Ref Range   Glucose-Capillary 79 65 - 99 mg/dL  Glucose, capillary     Status: None   Collection Time: 10/10/16 11:20 AM  Result Value Ref Range   Glucose-Capillary 81 65 - 99 mg/dL  Glucose, capillary     Status: None   Collection Time: 10/10/16  4:38 PM  Result Value Ref Range   Glucose-Capillary 84 65 - 99  mg/dL  Glucose, capillary     Status: None   Collection Time: 10/10/16  8:35 PM  Result Value Ref Range   Glucose-Capillary 96 65 - 99 mg/dL  Basic metabolic panel     Status: Abnormal   Collection Time: 10/11/16  6:51 AM  Result Value Ref Range   Sodium 136 135 - 145 mmol/L   Potassium 3.0 (L) 3.5 - 5.1 mmol/L   Chloride 98 (L) 101 - 111 mmol/L   CO2 29 22 - 32 mmol/L   Glucose, Bld 98 65 - 99 mg/dL   BUN 17 6 - 20 mg/dL   Creatinine, Ser 1.24 (H) 0.44 - 1.00 mg/dL   Calcium 8.1 (L) 8.9 - 10.3 mg/dL   GFR calc non Af Amer 49 (L) >60 mL/min   GFR calc Af Amer 57 (L) >60 mL/min    Comment: (NOTE) The eGFR has been calculated using the CKD EPI equation. This calculation has not been validated in all clinical situations. eGFR's persistently <60 mL/min signify possible Chronic Kidney Disease.    Anion gap 9 5 - 15  CBC     Status: Abnormal   Collection Time: 10/11/16  6:51 AM  Result Value Ref Range   WBC 9.7 4.0 - 10.5 K/uL   RBC 4.50 3.87 - 5.11 MIL/uL   Hemoglobin 13.9 12.0 - 15.0 g/dL   HCT 42.6 36.0 - 46.0 %   MCV 94.7 78.0 - 100.0 fL   MCH 30.9 26.0 - 34.0 pg   MCHC 32.6 30.0 - 36.0 g/dL   RDW 16.1 (H) 11.5 - 15.5 %   Platelets 244 150 - 400 K/uL  Magnesium     Status: Abnormal   Collection Time: 10/11/16  6:51 AM  Result Value Ref Range   Magnesium 1.6 (L) 1.7 - 2.4 mg/dL  Vitamin B12     Status: None   Collection Time: 10/11/16  6:51 AM  Result Value Ref Range   Vitamin B-12 451 180 - 914 pg/mL    Comment: (NOTE) This assay is  not validated for testing neonatal or myeloproliferative syndrome specimens for Vitamin B12 levels. Performed at [facility], [place] [address], [place], [place] [address]   Glucose, capillary     Status: None   Collection Time: 10/11/16  7:40 AM  Result Value Ref Range   Glucose-Capillary 96 65 - 99 mg/dL  Glucose, capillary     Status: None   Collection Time: 10/11/16 11:29 AM  Result Value Ref Range   Glucose-Capillary 90 65  - 99 mg/dL  Glucose, capillary     Status: Abnormal   Collection Time: 10/11/16  4:28 PM  Result Value Ref Range   Glucose-Capillary 137 (H) 65 - 99 mg/dL    Studies/Results:      BRAIN MRI MRA FINDINGS: MRI HEAD FINDINGS  Brain: Multiple chronic lacunar type infarcts in the right basal ganglia, bilateral thalami, left corona radiata and left lentiform. Some associated gliosis and Wallerian degeneration. Chronic cortical encephalomalacia in the inferior left occipital pole.  There is patchy diffusion abnormality along the posterior right corona radiata which appears mildly restricted (series 3, image 82 and series 4, image 31) with no significant T2 or FLAIR hyperintensity. There is a 9 mm area of cortically based more severely restricted diffusion in the left parietal lobe (series 3, image 85) with mild T2 and FLAIR hyperintensity.  No other restricted diffusion. No associated hemorrhage or mass effect. No definite chronic cerebral blood products. Cerebral volume is not significantly changed since 2016. No evidence of mass lesion, ventriculomegaly, extra-axial collection or acute intracranial hemorrhage. Cerebellum is within normal limits. Cervicomedullary junction and pituitary are within normal limits.  No abnormal enhancement identified.  Vascular: Major intracranial vascular flow voids are stable since 2016.  Skull and upper cervical spine: Stable and negative.  Sinuses/Orbits: Stable and negative.  Other: Scalp and face soft tissues appear negative.  MRA HEAD FINDINGS  The IV MultiHance contrast was accidentally administered prior to the MRA, which results in venous vascular contamination on the MRA images.  Chronic dominant distal left vertebral artery with bilateral distal vertebral irregularity and stenosis, worse on the right. Both distal vertebral arteries appear patent to the vertebrobasilar junction and stable compared to 2016. Stable  basilar artery without significant stenosis. SCA and PCA origins remain patent. There is chronic left P1 segment stenosis. Grossly stable bilateral PCA branches.  Preserved antegrade flow signal in both ICA siphons but chronic siphon irregularity and stenosis worse on the right. The siphons appears stable since 2016. Carotid termini remain patent. Stable MCA and left ACA origins. The right A1 segment is diminutive or absent. Anterior communicating artery and visible ACA branches are stable and within normal limits. MCA M1 segments are stable with mild irregularity. Both MCA by/trifurcations are patent. Stable visible bilateral MCA branches, with moderate irregularity. No MCA branch occlusion identified.  IMPRESSION: 1. There is a small acute infarct in the left parietal cortex, and also a small subacute infarct in the right corona radiata. No associated hemorrhage or mass effect. 2. Underlying chronically advanced ischemic disease, with severe chronic small vessel ischemia in and around the bilateral deep gray matter nuclei, and a chronic left PCA infarct. 3. Today's intracranial MRA is degraded by venous contamination but appears stable to a 2016 MRA, with widespread large vessel atherosclerosis and chronic stenoses of both distal vertebral arteries, the Right ICA siphon, and the Left PCA. 4. The constellation of findings suggests the ischemia in #1 is due to synchronous small vessel disease.  Carotid duplex Doppler shows no significant stenosis.  Barium swallow shows no strictures.    Echocardiography: - Left ventricle: The cavity size was mildly dilated. Wall   thickness was increased in a pattern of mild LVH. Systolic   function was severely reduced. The estimated ejection fraction   was 20%. Diffuse hypokinesis. Features are consistent with a   pseudonormal left ventricular filling pattern, with concomitant   abnormal relaxation and increased filling pressure  (grade 2   diastolic dysfunction). Doppler parameters are consistent with   high ventricular filling pressure. - Regional wall motion abnormality: Akinesis of the mid anterior,   mid anteroseptal, basal-mid inferior, and mid anterolateral   myocardium; severe hypokinesis of the apical inferior myocardium. - Aortic valve: Trileaflet; mildly calcified leaflets. - Mitral valve: There was moderate regurgitation. - Right ventricle: Systolic function was moderately to severely   reduced. - Tricuspid valve: There was moderate regurgitation. - Pulmonary arteries: PA peak pressure: 34 mm Hg (S). - Inferior vena cava: The vessel was dilated. The respirophasic   diameter changes were blunted (< 50%), consistent with elevated   central venous pressure.     The brain MRI is reviewed in person and shows a subacute infarct on DWI involving centrum semiovale on the right side adjacent to the lateral ventricle. This is a moderate sized lesion. There is a more acute cortical base lesion involving the left occipital parietal lobe moderate in size. There are multiple moderate sized the encephalomalacia involving the basal ganglia bilaterally especially on the right side. There is also lacunar infarcts involving the thalami bilaterally. There is moderate increased signal seen on FLAIR especially involving the frontal horns of the lateral ventricle particularly on the right side.  MRA shows significant stenosis involving the right supraclinoid ICA, right A1 segment and the right vertebral. There is also luminal irregularities of the MCAs and PCAs.    Labs a year ago: Hemoglobin A1c 12.7; total cholesterol 351, HDL 34 and triglycerides 1098    [place] Detjen A. Merlene Laughter, M.D.  Diplomate, Tax adviser of Psychiatry and Neurology ( Neurology). 10/11/2016, 6:45 PM

## 2016-10-11 NOTE — Progress Notes (Signed)
SLP Cancellation Note  Patient Details Name: Monica Carlson MRN: [id number] DOB: [birth year]   Cancelled treatment:       Reason Eval/Treat Not Completed: Fatigue/lethargy limiting ability to participate;Other (comment); Acknowledge BPE showing dysmotility. Pt sleeping soundly with dinner tray set aside in room. OK to advance up to D3/mechanical soft with standard aspiration and reflux precautions and monitor for tolerance.  Thank you,  Genene Churn, [place]    [place] 10/11/2016, 5:26 PM

## 2016-10-11 NOTE — Progress Notes (Signed)
Progress Note  Patient Name: Monica Carlson Date of Encounter: 10/11/2016  Primary Cardiologist: Dr Bronson Ing  Subjective   No SOB. She still has LE edema which she says was her initial complaint  Inpatient Medications    Scheduled Meds: . aspirin EC  325 mg Oral Daily  . atorvastatin  80 mg Oral q1800  . citalopram  20 mg Oral Daily  . clopidogrel  75 mg Oral Q breakfast  . enoxaparin (LOVENOX) injection  40 mg Subcutaneous Q24H  . fenofibrate  160 mg Oral Daily  . furosemide  40 mg Intravenous BID  . insulin aspart  0-15 Units Subcutaneous TID WC  . insulin aspart  0-5 Units Subcutaneous QHS  . levothyroxine  50 mcg Oral QAC breakfast  . living well with diabetes book   Does not apply Once  . niacin  500 mg Oral Daily  . pantoprazole  40 mg Oral Daily  . potassium chloride  20 mEq Oral BID  . sodium chloride flush  3 mL Intravenous Q12H   Continuous Infusions: . sodium chloride     PRN Meds: sodium chloride, acetaminophen, ondansetron (ZOFRAN) IV, sodium chloride flush   Vital Signs    Vitals:   10/10/16 1500 10/10/16 1900 10/10/16 2106 10/11/16 0628  BP: 135/78 139/78 (!) 143/89 134/84  Pulse: 70 80 73 89  Resp: 18 18  16   Temp: 97.8 F (36.6 C) 97.7 F (36.5 C) (!) 97.5 F (36.4 C) (!) 95.8 F (35.4 C)  TempSrc: Oral Oral Oral Oral  SpO2: 93% 95% 93% 90%  Weight:    212 lb 15.4 oz (96.6 kg)  Height:        Intake/Output Summary (Last 24 hours) at 10/11/16 0841 Last data filed at 10/11/16 9357  Gross per 24 hour  Intake               50 ml  Output             3201 ml  Net            -3151 ml   Filed Weights   10/09/16 0629 10/10/16 0625 10/11/16 0628  Weight: 220 lb 3.8 oz (99.9 kg) 217 lb 13 oz (98.8 kg) 212 lb 15.4 oz (96.6 kg)    Telemetry    NSR - Personally Reviewed  ECG    NSR - Personally Reviewed  Physical Exam   GEN: Overweight, No acute distress.   Neck: No JVD Cardiac: RRR, no murmurs, rubs, or gallops.  Respiratory: Clear  to auscultation bilaterally. GI: Soft, nontender, non-distended  MS: No edema; No deformity. 1+ pitting edema LE Neuro:  Nonfocal  Psych: Normal affect   Labs    Chemistry Recent Labs Lab 10/07/16 0213  10/09/16 0614 10/10/16 0603 10/11/16 0651  NA 136  < > 135 138 136  K 3.9  < > 3.6 3.5 3.0*  CL 101  < > 100* 99* 98*  CO2 28  < > 25 26 29   GLUCOSE 160*  < > 105* 81 98  BUN 11  < > 17 19 17   CREATININE 0.82  < > 1.40* 1.46* 1.24*  CALCIUM 8.6*  < > 8.6* 8.4* 8.1*  PROT 6.1*  --   --   --   --   ALBUMIN 2.9*  --   --   --   --   AST 15  --   --   --   --   ALT 11*  --   --   --   --  ALKPHOS 55  --   --   --   --   BILITOT 1.3*  --   --   --   --   GFRNONAA >60  < > 43* 41* 49*  GFRAA >60  < > 49* 47* 57*  ANIONGAP 7  < > 10 13 9   < > = values in this interval not displayed.   Hematology Recent Labs Lab 10/07/16 0213 10/11/16 0651  WBC 7.5 9.7  RBC 4.71 4.50  HGB 14.7 13.9  HCT 45.2 42.6  MCV 96.0 94.7  MCH 31.2 30.9  MCHC 32.5 32.6  RDW 15.6* 16.1*  PLT 224 244    Cardiac EnzymesNo results for input(s): TROPONINI in the last 168 hours. No results for input(s): TROPIPOC in the last 168 hours.   BNP Recent Labs Lab 10/07/16 0213  BNP 944.0*     DDimer No results for input(s): DDIMER in the last 168 hours.   Radiology    Ct Head Wo Contrast  Result Date: 10/09/2016 CLINICAL DATA:  LEFT extremity weakness beginning earlier today. Assess stroke. History of hypertension, hypercholesterolemia and diabetes. EXAM: CT HEAD WITHOUT CONTRAST TECHNIQUE: Contiguous axial images were obtained from the base of the skull through the vertex without intravenous contrast. COMPARISON:  MRI of the head October 03, 2014 FINDINGS: BRAIN: Faint blurring of the RIGHT temporal gray-white matter differentiation with subtle RIGHT insular ribbon sign. Old bilateral basal ganglia and infarcts. Old small LEFT occipital lobe infarct. Patchy pontine hypodensity (axial 8/31). Old LEFT  thalamus lacunar infarct. Focal RIGHT frontal gliosis is unchanged is mild ex vacuo dilatation RIGHT frontal horn of lateral ventricle. No hydrocephalus. No intraparenchymal hemorrhage, mass effect or midline shift. No abnormal extra-axial fluid collections. VASCULAR: Moderate calcific atherosclerosis the carotid siphons. SKULL/SOFT TISSUES: No skull fracture. No significant soft tissue swelling. ORBITS/SINUSES: The included ocular globes and orbital contents are normal.The mastoid aircells and included paranasal sinuses are well-aerated. OTHER: None. IMPRESSION: 1. Acute suspected small RIGHT MCA territory infarct. 2. Acute pontine infarct versus artifact. 3. Old bilateral basal ganglia, LEFT thalamus infarcts and old small LEFT occipital lobe/PCA territory infarct. 4. Moderate atherosclerosis, advanced for age. These results will be called to the ordering clinician or representative by the Radiologist Assistant, and communication documented in the PACS or zVision Dashboard. Electronically Signed   By: Elon Alas M.D.   On: 10/09/2016 22:21   Mr Virgel Paling OQ Contrast  Result Date: 10/10/2016 CLINICAL DATA:  51 year old female with left side weakness and plain head CT findings suspicious for an acute right MCA territory infarct. EXAM: MRI HEAD WITHOUT AND WITH CONTRAST MRA HEAD WITHOUT CONTRAST TECHNIQUE: Multiplanar, multiecho pulse sequences of the brain and surrounding structures were obtained without and with intravenous contrast. Angiographic images of the head were obtained using MRA technique without contrast. CONTRAST:  96mL MULTIHANCE GADOBENATE DIMEGLUMINE 529 MG/ML IV SOLN COMPARISON:  Head CT without contrast 10/09/2016. [facility] brain MRI, head and neck MRA 10/03/2014. FINDINGS: MRI HEAD FINDINGS Brain: Multiple chronic lacunar type infarcts in the right basal ganglia, bilateral thalami, left corona radiata and left lentiform. Some associated gliosis and Wallerian degeneration.  Chronic cortical encephalomalacia in the inferior left occipital pole. There is patchy diffusion abnormality along the posterior right corona radiata which appears mildly restricted (series 3, image 82 and series 4, image 31) with no significant T2 or FLAIR hyperintensity. There is a 9 mm area of cortically based more severely restricted diffusion in the left parietal lobe (series 3,  image 85) with mild T2 and FLAIR hyperintensity. No other restricted diffusion. No associated hemorrhage or mass effect. No definite chronic cerebral blood products. Cerebral volume is not significantly changed since 2016. No evidence of mass lesion, ventriculomegaly, extra-axial collection or acute intracranial hemorrhage. Cerebellum is within normal limits. Cervicomedullary junction and pituitary are within normal limits. No abnormal enhancement identified. Vascular: Major intracranial vascular flow voids are stable since 2016. Skull and upper cervical spine: Stable and negative. Sinuses/Orbits: Stable and negative. Other: Scalp and face soft tissues appear negative. MRA HEAD FINDINGS The IV MultiHance contrast was accidentally administered prior to the MRA, which results in venous vascular contamination on the MRA images. Chronic dominant distal left vertebral artery with bilateral distal vertebral irregularity and stenosis, worse on the right. Both distal vertebral arteries appear patent to the vertebrobasilar junction and stable compared to 2016. Stable basilar artery without significant stenosis. SCA and PCA origins remain patent. There is chronic left P1 segment stenosis. Grossly stable bilateral PCA branches. Preserved antegrade flow signal in both ICA siphons but chronic siphon irregularity and stenosis worse on the right. The siphons appears stable since 2016. Carotid termini remain patent. Stable MCA and left ACA origins. The right A1 segment is diminutive or absent. Anterior communicating artery and visible ACA branches are  stable and within normal limits. MCA M1 segments are stable with mild irregularity. Both MCA by/trifurcations are patent. Stable visible bilateral MCA branches, with moderate irregularity. No MCA branch occlusion identified. IMPRESSION: 1. There is a small acute infarct in the left parietal cortex, and also a small subacute infarct in the right corona radiata. No associated hemorrhage or mass effect. 2. Underlying chronically advanced ischemic disease, with severe chronic small vessel ischemia in and around the bilateral deep gray matter nuclei, and a chronic left PCA infarct. 3. Today's intracranial MRA is degraded by venous contamination but appears stable to a 2016 MRA, with widespread large vessel atherosclerosis and chronic stenoses of both distal vertebral arteries, the Right ICA siphon, and the Left PCA. 4. The constellation of findings suggests the ischemia in #1 is due to synchronous small vessel disease. Electronically Signed   By: Genevie Ann M.D.   On: 10/10/2016 09:45   Mr Jeri Cos FH Contrast  Result Date: 10/10/2016 CLINICAL DATA:  51 year old female with left side weakness and plain head CT findings suspicious for an acute right MCA territory infarct. EXAM: MRI HEAD WITHOUT AND WITH CONTRAST MRA HEAD WITHOUT CONTRAST TECHNIQUE: Multiplanar, multiecho pulse sequences of the brain and surrounding structures were obtained without and with intravenous contrast. Angiographic images of the head were obtained using MRA technique without contrast. CONTRAST:  69mL MULTIHANCE GADOBENATE DIMEGLUMINE 529 MG/ML IV SOLN COMPARISON:  Head CT without contrast 10/09/2016. [facility] brain MRI, head and neck MRA 10/03/2014. FINDINGS: MRI HEAD FINDINGS Brain: Multiple chronic lacunar type infarcts in the right basal ganglia, bilateral thalami, left corona radiata and left lentiform. Some associated gliosis and Wallerian degeneration. Chronic cortical encephalomalacia in the inferior left occipital pole.  There is patchy diffusion abnormality along the posterior right corona radiata which appears mildly restricted (series 3, image 82 and series 4, image 31) with no significant T2 or FLAIR hyperintensity. There is a 9 mm area of cortically based more severely restricted diffusion in the left parietal lobe (series 3, image 85) with mild T2 and FLAIR hyperintensity. No other restricted diffusion. No associated hemorrhage or mass effect. No definite chronic cerebral blood products. Cerebral volume is not significantly changed since  2016. No evidence of mass lesion, ventriculomegaly, extra-axial collection or acute intracranial hemorrhage. Cerebellum is within normal limits. Cervicomedullary junction and pituitary are within normal limits. No abnormal enhancement identified. Vascular: Major intracranial vascular flow voids are stable since 2016. Skull and upper cervical spine: Stable and negative. Sinuses/Orbits: Stable and negative. Other: Scalp and face soft tissues appear negative. MRA HEAD FINDINGS The IV MultiHance contrast was accidentally administered prior to the MRA, which results in venous vascular contamination on the MRA images. Chronic dominant distal left vertebral artery with bilateral distal vertebral irregularity and stenosis, worse on the right. Both distal vertebral arteries appear patent to the vertebrobasilar junction and stable compared to 2016. Stable basilar artery without significant stenosis. SCA and PCA origins remain patent. There is chronic left P1 segment stenosis. Grossly stable bilateral PCA branches. Preserved antegrade flow signal in both ICA siphons but chronic siphon irregularity and stenosis worse on the right. The siphons appears stable since 2016. Carotid termini remain patent. Stable MCA and left ACA origins. The right A1 segment is diminutive or absent. Anterior communicating artery and visible ACA branches are stable and within normal limits. MCA M1 segments are stable with mild  irregularity. Both MCA by/trifurcations are patent. Stable visible bilateral MCA branches, with moderate irregularity. No MCA branch occlusion identified. IMPRESSION: 1. There is a small acute infarct in the left parietal cortex, and also a small subacute infarct in the right corona radiata. No associated hemorrhage or mass effect. 2. Underlying chronically advanced ischemic disease, with severe chronic small vessel ischemia in and around the bilateral deep gray matter nuclei, and a chronic left PCA infarct. 3. Today's intracranial MRA is degraded by venous contamination but appears stable to a 2016 MRA, with widespread large vessel atherosclerosis and chronic stenoses of both distal vertebral arteries, the Right ICA siphon, and the Left PCA. 4. The constellation of findings suggests the ischemia in #1 is due to synchronous small vessel disease. Electronically Signed   By: Genevie Ann M.D.   On: 10/10/2016 09:45   Dg Esophagus  Result Date: 10/10/2016 CLINICAL DATA:  Dysphagia EXAM: ESOPHOGRAM/BARIUM SWALLOW TECHNIQUE: Single contrast examination was performed using  thin barium. FLUOROSCOPY TIME:  Fluoroscopy Time:  2 minutes 6 seconds Radiation Exposure Index (if provided by the fluoroscopic device): 39.7 mGy Number of Acquired Spot Images: 0 COMPARISON:  None. FINDINGS: Fluoroscopic evaluation of swallowing was performed. This shows disruption of primary esophageal waves with stasis. No fixed esophageal stricture, fold thickening or mass. No reflux with the water siphon maneuver. The patient swallowed a 13 mm barium tablet which freely passed into the stomach. IMPRESSION: Esophageal dysmotility. No esophageal stricture. Electronically Signed   By: Rolm Baptise M.D.   On: 10/10/2016 16:02   US Carotid Bilateral  Result Date: 10/10/2016 CLINICAL DATA:  Left-sided weakness. Trouble ambulating. Recent stroke. EXAM: BILATERAL CAROTID DUPLEX ULTRASOUND TECHNIQUE: Pearline Cables scale imaging, color Doppler and duplex  ultrasound were performed of bilateral carotid and vertebral arteries in the neck. COMPARISON:  10/06/2014 FINDINGS: Criteria: Quantification of carotid stenosis is based on velocity parameters that correlate the residual internal carotid diameter with NASCET-based stenosis levels, using the diameter of the distal internal carotid lumen as the denominator for stenosis measurement. The following velocity measurements were obtained: RIGHT ICA:  75 cm/sec CCA:  72 cm/sec SYSTOLIC ICA/CCA RATIO:  1.0 DIASTOLIC ICA/CCA RATIO:  1.5 ECA:  87 cm/sec LEFT ICA:  78 cm/sec CCA:  69 cm/sec SYSTOLIC ICA/CCA RATIO:  1.1 DIASTOLIC ICA/CCA RATIO:  1.9 ECA:  84 cm/sec RIGHT CAROTID ARTERY: Small amount of plaque at the right carotid bulb and origin of the internal carotid artery. Plaque in the proximal internal carotid artery is mildly heterogeneous. Normal waveforms and velocities in the internal carotid artery. External carotid artery is patent with normal waveform. RIGHT VERTEBRAL ARTERY: Antegrade flow and normal waveform in the right vertebral artery. LEFT CAROTID ARTERY: Small amount of plaque at the left carotid bulb and proximal internal carotid artery. External carotid artery is patent with normal waveform. Normal waveforms and velocities in the internal carotid artery. LEFT VERTEBRAL ARTERY: Antegrade flow and normal waveform in the left vertebral artery. IMPRESSION: Small amount of plaque at the carotid bulb and proximal internal carotid arteries bilaterally. Estimated degree of stenosis in the internal carotid arteries is less than 50% bilaterally. Patent vertebral arteries with antegrade flow. Electronically Signed   By: Markus Daft M.D.   On: 10/10/2016 11:48    Cardiac Studies   Echo 10/07/16- Study Conclusions  - Left ventricle: The cavity size was mildly dilated. Wall   thickness was increased in a pattern of mild LVH. Systolic   function was severely reduced. The estimated ejection fraction   was 20%.  Diffuse hypokinesis. Features are consistent with a   pseudonormal left ventricular filling pattern, with concomitant   abnormal relaxation and increased filling pressure (grade 2   diastolic dysfunction). Doppler parameters are consistent with   high ventricular filling pressure. - Regional wall motion abnormality: Akinesis of the mid anterior,   mid anteroseptal, basal-mid inferior, and mid anterolateral   myocardium; severe hypokinesis of the apical inferior myocardium. - Aortic valve: Trileaflet; mildly calcified leaflets. - Mitral valve: There was moderate regurgitation. - Right ventricle: Systolic function was moderately to severely   reduced. - Tricuspid valve: There was moderate regurgitation. - Pulmonary arteries: PA peak pressure: 34 mm Hg (S). - Inferior vena cava: The vessel was dilated. The respirophasic   diameter changes were blunted (< 50%), consistent with elevated   central venous pressure.  Carotid US 10/10/16- IMPRESSION: Small amount of plaque at the carotid bulb and proximal internal carotid arteries bilaterally. Estimated degree of stenosis in the internal carotid arteries is less than 50% bilaterally.  Patent vertebral arteries with antegrade flow.    Patient Profile     51 y.o. female, lives alone in [place], with a history of HTN, DM, and CAD admitted 10/07/16 with edema and dyspnea. She was found to have new CM with an EF of 20% and bilateral CVA.  Assessment & Plan    Acute combined systolic and diastolic CHF- EF 09%W with multiple WMA and grade 2 DD. She has diuresed 3.7L, wgt is unclear. BNP on admission was 944. She still appears volume overloaded on exam.   Cardiomyopathy- Presumable ischemic with WMA and history of CAD. She denies any anginal symptoms and no Troponin drawn this admission. Plan is for OP ischemic work up.   CAD- History of RCA BMS 2004. Myoview low risk and EF 55% May 2017  Bilateral CVA- Possibly secondary to SVD but  undetected PAF also a possibility, consider OP 30 day event at discharge  Uncontrolled DM- Hgb A1c 9.9 on admission  HTN- With HCVD-grade 2 DD  Elevated TSH- Pt says she was on thyroid medication at some point but hasn't taken it lately (? compliance with other medications). Synthroid adjusted by primary service  HLD- LDL 106-on statin  Plan: She is on Lasix 40 mg IV BID, would continue another 24 hrs. Check  f/u BNP in am. K+ 3.0 will give extra 20 meq KCL this am.    ACE stopped on admission secondary to transient renal insufficiency -SCr went to 1.4. Consider nitrate and hydralazine and addition of Aldactone. MD to see.    TSH per primary service.  Signed, Kerin Ransom, PA-C  10/11/2016, 8:41 AM    The patient was seen and examined, and I agree with the history, physical exam, assessment and plan as documented above, with modifications as noted below.  She has diuresed over 3 liters in last 24 hrs. Symptomatically improved. Would continue IV diuretics for an additional 24 hrs and perhaps switch to oral Lasix on 8/22. Consider Toprol-XL vs Coreg on 8/22 as well. Recommend outpatient ischemic workup. If renal function continues to improve, I would consider Entresto in outpatient setting.  Kate Sable, MD, [facility]  10/11/2016 10:47 AM

## 2016-10-11 NOTE — Progress Notes (Addendum)
PROGRESS NOTE  Monica Carlson YNW:[id number] DOB: October 04, 1965 DOA: 10/07/2016 PCP: Sandria Manly [place] Brief History: 51 y.o.femalewith medical history of essential hypertension, diabetes mellitus, coronary artery disease, and CHF presented with 1-2week history of shortness of breath, leg edema, and dyspnea on exertion. The patient states that she was admitted to [facility] third week of July. She states that she was discharged with a fluid pill with which she has been compliant. However, in the past week she states that she has not been urinating as much. Therefore, she has been drinkingmore fluid in hopes to make herself urinate more. She has had difficulty laying flat. For the past month, she has had to sleep sitting up in a recliner.  In the emergency department, the patient was afebrile and hemodynamically stable saturating 93% on room air. BMP and CBC were unremarkable. Hepatic enzymes were unremarkable. BNP was 944. Chest x-ray showed interstitial prominence with small bilateral pleural effusions. The patient was started on IV lasix with good clinical results.  Echocardiogram showed EF 20%. As a result, cardiology was consulted. Her hospitalization was complicated by acute ischemic stroke. Neurology was consulted to assist.  Assessment/Plan: Acute on chronic systolic and diastolic CHF -daily weights--NEG 3 lbs since admission -now able to lay flat, but still appears hypervolemic -accurate I/O's--not accurate -fluid restrict -Echo--EF 20%, diffuse HK, grade 2 DD, significant regional wall motion abnormalities. Decreased RV function  -continue IV Lasix 40 mg bid -pt appears to have poor insight on her medical condition; poor health literacy -d/c lisinopril due to now soft BPs and increasing creatinine -decrease dose of metoprolol tartrate due to soft BPs -appreciate cardiology -appreciate neurology--DAPT x 90 days, then ASA only; needs 30 day event monitor upon  d/c -plan to restart BB in next 24 hours  Acute ischemic stroke - LUE & LLE weakness started in AM 8/19, but did not tell anyone -RN noted weakness on PM assessment at 2030 -8/19 CT brain--new small right MCA infarct, acute pontine infarct -Neurology Consult-->ASA+Plavix x 90 days, then ASA alone; needs 30 day event monitor after discharge -PT/OT evaluation-->HHPT -Speech therapy eval--->dysphagia 2 with thin -MRI brain--acute infarct left parietal cortex; subacute right corona radiata infarct -MRA brain--chronic bilateral distal vertebral stenosis -Carotid Duplex--negative for hemodynamically significant stenosis -Echo---Echo--EF 20%, diffuse HK, grade 2 DD, significant regional wall motion abnormalities. -LDL--106 -HbA1C--9.9 -Antiplatelet--ASA 325 mg daily+Plavix 75 mg -increased lipitor to 80 mg daily  AKI -likely cardiorenal syndrome -now stable/improving -am BMP  Diabetes mellitus type 2, uncontrolled -10/07/16-hemoglobin A1c--9.9 -NovoLog sliding scale--change to sensitive scale -Holding metformin and Amaryl -CBGs controlled during hospitalization--?compliance at home  Hyperlipidemia -Continue statin and TriCor -increase atorvastatin to 80 mg daily  Dysphagia -speech therapy eval-->dysphagia 2 with thin -esophagram--esophageal dysmotility -GI consult appreciated  Essential hypertension -now BP soft with diuresis -d/c lisinopril due to soft BPs and SKI -d/c metoprolol due to soft BPs and new stroke  Coronary artery disease with hx MI -10/01/2004for heart catheterization--stented mid RCA, EF 69% -No chest pain presently -Continue aspirin -Continue metoprolol tartrate  Hypothyroidism -Continue Synthroid--increase dose to 50 mcg daily -TSH 17.57  Hypokalemia/Hypomagnesemia -Replete    Disposition Plan: Home When cleared by cardiology/neurology Family Communication: advocate updated atbedside 8/21  Consultants: cardiology,  neurology  Code Status: FULL  DVT Prophylaxis: [place] Lovenox   Procedures: As Listed in Progress Note Above  Antibiotics: None     Subjective: Patient denies fevers, chills, headache, chest pain, dyspnea, nausea, vomiting, diarrhea,  abdominal pain, dysuria, hematuria, hematochezia, and melena.   Objective: Vitals:   10/10/16 1900 10/10/16 2106 10/11/16 0628 10/11/16 1424  BP: 139/78 (!) 143/89 134/84 131/83  Pulse: 80 73 89 86  Resp: 18  16 16   Temp: 97.7 F (36.5 C) (!) 97.5 F (36.4 C) (!) 95.8 F (35.4 C) 98.4 F (36.9 C)  TempSrc: Oral Oral Oral Oral  SpO2: 95% 93% 90% 94%  Weight:   96.6 kg (212 lb 15.4 oz)   Height:        Intake/Output Summary (Last 24 hours) at 10/11/16 1525 Last data filed at 10/11/16 1424  Gross per 24 hour  Intake              480 ml  Output             5401 ml  Net            -4921 ml   Weight change: -2.2 kg (-4 lb 13.6 oz) Exam:   General:  Pt is alert, follows commands appropriately, not in acute distress  HEENT: No icterus, No thrush, No neck mass, [place]/AT  Cardiovascular: RRR, S1/S2, no rubs, no gallops  Respiratory: Bibasilar crackles. No wheezing. Good air movement.  Abdomen: Soft/+BS, non tender, non distended, no guarding  Extremities: 1 + LE edema, No lymphangitis, No petechiae, No rashes, no synovitis   Data Reviewed: I have personally reviewed following labs and imaging studies Basic Metabolic Panel:  Recent Labs Lab 10/07/16 0213 10/07/16 0834 10/08/16 0628 10/09/16 0614 10/10/16 0603 10/11/16 0651  NA 136  --  136 135 138 136  K 3.9  --  3.8 3.6 3.5 3.0*  CL 101  --  100* 100* 99* 98*  CO2 28  --  27 25 26 29   GLUCOSE 160*  --  134* 105* 81 98  BUN 11  --  14 17 19 17   CREATININE 0.82  --  1.02* 1.40* 1.46* 1.24*  CALCIUM 8.6*  --  8.6* 8.6* 8.4* 8.1*  MG  --  1.5* 1.6* 1.7 1.8 1.6*   Liver Function Tests:  Recent Labs Lab 10/07/16 0213  AST 15  ALT 11*  ALKPHOS 55  BILITOT 1.3*   PROT 6.1*  ALBUMIN 2.9*   No results for input(s): LIPASE, AMYLASE in the last 168 hours. No results for input(s): AMMONIA in the last 168 hours. Coagulation Profile: No results for input(s): INR, PROTIME in the last 168 hours. CBC:  Recent Labs Lab 10/07/16 0213 10/11/16 0651  WBC 7.5 9.7  NEUTROABS 3.8  --   HGB 14.7 13.9  HCT 45.2 42.6  MCV 96.0 94.7  PLT 224 244   Cardiac Enzymes: No results for input(s): CKTOTAL, CKMB, CKMBINDEX, TROPONINI in the last 168 hours. BNP: Invalid input(s): POCBNP CBG:  Recent Labs Lab 10/10/16 1120 10/10/16 1638 10/10/16 2035 10/11/16 0740 10/11/16 1129  GLUCAP 81 84 96 96 90   HbA1C: No results for input(s): HGBA1C in the last 72 hours. Urine analysis:    Component Value Date/Time   COLORURINE YELLOW 06/19/2014 0846   APPEARANCEUR TURBID (A) 06/19/2014 0846   LABSPEC 1.029 06/19/2014 0846   PHURINE 5.5 06/19/2014 0846   GLUCOSEU >1000 (A) 06/19/2014 0846   HGBUR MODERATE (A) 06/19/2014 0846   BILIRUBINUR NEGATIVE 06/19/2014 0846   KETONESUR NEGATIVE 06/19/2014 0846   PROTEINUR 30 (A) 06/19/2014 0846   UROBILINOGEN 0.2 06/19/2014 0846   NITRITE POSITIVE (A) 06/19/2014 0846   LEUKOCYTESUR LARGE (A) 06/19/2014 6767  Sepsis Labs: @LABRCNTIP (procalcitonin:4,lacticidven:4) )No results found for this or any previous visit (from the past 240 hour(s)).   Scheduled Meds: . aspirin EC  325 mg Oral Daily  . atorvastatin  80 mg Oral q1800  . citalopram  20 mg Oral Daily  . clopidogrel  75 mg Oral Q breakfast  . enoxaparin (LOVENOX) injection  40 mg Subcutaneous Q24H  . fenofibrate  160 mg Oral Daily  . furosemide  40 mg Intravenous BID  . insulin aspart  0-15 Units Subcutaneous TID WC  . insulin aspart  0-5 Units Subcutaneous QHS  . levothyroxine  50 mcg Oral QAC breakfast  . niacin  500 mg Oral Daily  . pantoprazole  40 mg Oral Daily  . potassium chloride  20 mEq Oral BID  . sodium chloride flush  3 mL Intravenous Q12H    Continuous Infusions: . sodium chloride      Procedures/Studies: Dg Chest 2 View  Result Date: 10/07/2016 CLINICAL DATA:  Bilateral leg swelling, nonproductive cough since July. EXAM: CHEST  2 VIEW COMPARISON:  Chest radiograph September 17, 2016 FINDINGS: Cardiac silhouette is similarly enlarged, mediastinal silhouette is nonsuspicious considering AP technique. Pulmonary vascular congestion and mild interstitial prominence. Small pleural effusions are unchanged. Persistently elevated RIGHT hemidiaphragm. No pneumothorax. Soft tissue planes and included osseous structures are unchanged. IMPRESSION: Stable cardiomegaly. Interstitial prominence favoring pulmonary edema with small pleural effusions. Electronically Signed   By: Elon Alas M.D.   On: 10/07/2016 05:40   Ct Head Wo Contrast  Result Date: 10/09/2016 CLINICAL DATA:  LEFT extremity weakness beginning earlier today. Assess stroke. History of hypertension, hypercholesterolemia and diabetes. EXAM: CT HEAD WITHOUT CONTRAST TECHNIQUE: Contiguous axial images were obtained from the base of the skull through the vertex without intravenous contrast. COMPARISON:  MRI of the head October 03, 2014 FINDINGS: BRAIN: Faint blurring of the RIGHT temporal gray-white matter differentiation with subtle RIGHT insular ribbon sign. Old bilateral basal ganglia and infarcts. Old small LEFT occipital lobe infarct. Patchy pontine hypodensity (axial 8/31). Old LEFT thalamus lacunar infarct. Focal RIGHT frontal gliosis is unchanged is mild ex vacuo dilatation RIGHT frontal horn of lateral ventricle. No hydrocephalus. No intraparenchymal hemorrhage, mass effect or midline shift. No abnormal extra-axial fluid collections. VASCULAR: Moderate calcific atherosclerosis the carotid siphons. SKULL/SOFT TISSUES: No skull fracture. No significant soft tissue swelling. ORBITS/SINUSES: The included ocular globes and orbital contents are normal.The mastoid aircells and included  paranasal sinuses are well-aerated. OTHER: None. IMPRESSION: 1. Acute suspected small RIGHT MCA territory infarct. 2. Acute pontine infarct versus artifact. 3. Old bilateral basal ganglia, LEFT thalamus infarcts and old small LEFT occipital lobe/PCA territory infarct. 4. Moderate atherosclerosis, advanced for age. These results will be called to the ordering clinician or representative by the Radiologist Assistant, and communication documented in the PACS or zVision Dashboard. Electronically Signed   By: Elon Alas M.D.   On: 10/09/2016 22:21   Mr Virgel Paling AC Contrast  Result Date: 10/10/2016 CLINICAL DATA:  51 year old female with left side weakness and plain head CT findings suspicious for an acute right MCA territory infarct. EXAM: MRI HEAD WITHOUT AND WITH CONTRAST MRA HEAD WITHOUT CONTRAST TECHNIQUE: Multiplanar, multiecho pulse sequences of the brain and surrounding structures were obtained without and with intravenous contrast. Angiographic images of the head were obtained using MRA technique without contrast. CONTRAST:  58mL MULTIHANCE GADOBENATE DIMEGLUMINE 529 MG/ML IV SOLN COMPARISON:  Head CT without contrast 10/09/2016. [facility] brain MRI, head and neck MRA 10/03/2014. FINDINGS: MRI HEAD  FINDINGS Brain: Multiple chronic lacunar type infarcts in the right basal ganglia, bilateral thalami, left corona radiata and left lentiform. Some associated gliosis and Wallerian degeneration. Chronic cortical encephalomalacia in the inferior left occipital pole. There is patchy diffusion abnormality along the posterior right corona radiata which appears mildly restricted (series 3, image 82 and series 4, image 31) with no significant T2 or FLAIR hyperintensity. There is a 9 mm area of cortically based more severely restricted diffusion in the left parietal lobe (series 3, image 85) with mild T2 and FLAIR hyperintensity. No other restricted diffusion. No associated hemorrhage or mass effect.  No definite chronic cerebral blood products. Cerebral volume is not significantly changed since 2016. No evidence of mass lesion, ventriculomegaly, extra-axial collection or acute intracranial hemorrhage. Cerebellum is within normal limits. Cervicomedullary junction and pituitary are within normal limits. No abnormal enhancement identified. Vascular: Major intracranial vascular flow voids are stable since 2016. Skull and upper cervical spine: Stable and negative. Sinuses/Orbits: Stable and negative. Other: Scalp and face soft tissues appear negative. MRA HEAD FINDINGS The IV MultiHance contrast was accidentally administered prior to the MRA, which results in venous vascular contamination on the MRA images. Chronic dominant distal left vertebral artery with bilateral distal vertebral irregularity and stenosis, worse on the right. Both distal vertebral arteries appear patent to the vertebrobasilar junction and stable compared to 2016. Stable basilar artery without significant stenosis. SCA and PCA origins remain patent. There is chronic left P1 segment stenosis. Grossly stable bilateral PCA branches. Preserved antegrade flow signal in both ICA siphons but chronic siphon irregularity and stenosis worse on the right. The siphons appears stable since 2016. Carotid termini remain patent. Stable MCA and left ACA origins. The right A1 segment is diminutive or absent. Anterior communicating artery and visible ACA branches are stable and within normal limits. MCA M1 segments are stable with mild irregularity. Both MCA by/trifurcations are patent. Stable visible bilateral MCA branches, with moderate irregularity. No MCA branch occlusion identified. IMPRESSION: 1. There is a small acute infarct in the left parietal cortex, and also a small subacute infarct in the right corona radiata. No associated hemorrhage or mass effect. 2. Underlying chronically advanced ischemic disease, with severe chronic small vessel ischemia in and  around the bilateral deep gray matter nuclei, and a chronic left PCA infarct. 3. Today's intracranial MRA is degraded by venous contamination but appears stable to a 2016 MRA, with widespread large vessel atherosclerosis and chronic stenoses of both distal vertebral arteries, the Right ICA siphon, and the Left PCA. 4. The constellation of findings suggests the ischemia in #1 is due to synchronous small vessel disease. Electronically Signed   By: Genevie Ann M.D.   On: 10/10/2016 09:45   Mr Jeri Cos ID Contrast  Result Date: 10/10/2016 CLINICAL DATA:  51 year old female with left side weakness and plain head CT findings suspicious for an acute right MCA territory infarct. EXAM: MRI HEAD WITHOUT AND WITH CONTRAST MRA HEAD WITHOUT CONTRAST TECHNIQUE: Multiplanar, multiecho pulse sequences of the brain and surrounding structures were obtained without and with intravenous contrast. Angiographic images of the head were obtained using MRA technique without contrast. CONTRAST:  63mL MULTIHANCE GADOBENATE DIMEGLUMINE 529 MG/ML IV SOLN COMPARISON:  Head CT without contrast 10/09/2016. [facility] brain MRI, head and neck MRA 10/03/2014. FINDINGS: MRI HEAD FINDINGS Brain: Multiple chronic lacunar type infarcts in the right basal ganglia, bilateral thalami, left corona radiata and left lentiform. Some associated gliosis and Wallerian degeneration. Chronic cortical encephalomalacia in the  inferior left occipital pole. There is patchy diffusion abnormality along the posterior right corona radiata which appears mildly restricted (series 3, image 82 and series 4, image 31) with no significant T2 or FLAIR hyperintensity. There is a 9 mm area of cortically based more severely restricted diffusion in the left parietal lobe (series 3, image 85) with mild T2 and FLAIR hyperintensity. No other restricted diffusion. No associated hemorrhage or mass effect. No definite chronic cerebral blood products. Cerebral volume is not  significantly changed since 2016. No evidence of mass lesion, ventriculomegaly, extra-axial collection or acute intracranial hemorrhage. Cerebellum is within normal limits. Cervicomedullary junction and pituitary are within normal limits. No abnormal enhancement identified. Vascular: Major intracranial vascular flow voids are stable since 2016. Skull and upper cervical spine: Stable and negative. Sinuses/Orbits: Stable and negative. Other: Scalp and face soft tissues appear negative. MRA HEAD FINDINGS The IV MultiHance contrast was accidentally administered prior to the MRA, which results in venous vascular contamination on the MRA images. Chronic dominant distal left vertebral artery with bilateral distal vertebral irregularity and stenosis, worse on the right. Both distal vertebral arteries appear patent to the vertebrobasilar junction and stable compared to 2016. Stable basilar artery without significant stenosis. SCA and PCA origins remain patent. There is chronic left P1 segment stenosis. Grossly stable bilateral PCA branches. Preserved antegrade flow signal in both ICA siphons but chronic siphon irregularity and stenosis worse on the right. The siphons appears stable since 2016. Carotid termini remain patent. Stable MCA and left ACA origins. The right A1 segment is diminutive or absent. Anterior communicating artery and visible ACA branches are stable and within normal limits. MCA M1 segments are stable with mild irregularity. Both MCA by/trifurcations are patent. Stable visible bilateral MCA branches, with moderate irregularity. No MCA branch occlusion identified. IMPRESSION: 1. There is a small acute infarct in the left parietal cortex, and also a small subacute infarct in the right corona radiata. No associated hemorrhage or mass effect. 2. Underlying chronically advanced ischemic disease, with severe chronic small vessel ischemia in and around the bilateral deep gray matter nuclei, and a chronic left PCA  infarct. 3. Today's intracranial MRA is degraded by venous contamination but appears stable to a 2016 MRA, with widespread large vessel atherosclerosis and chronic stenoses of both distal vertebral arteries, the Right ICA siphon, and the Left PCA. 4. The constellation of findings suggests the ischemia in #1 is due to synchronous small vessel disease. Electronically Signed   By: Genevie Ann M.D.   On: 10/10/2016 09:45   Dg Esophagus  Result Date: 10/10/2016 CLINICAL DATA:  Dysphagia EXAM: ESOPHOGRAM/BARIUM SWALLOW TECHNIQUE: Single contrast examination was performed using  thin barium. FLUOROSCOPY TIME:  Fluoroscopy Time:  2 minutes 6 seconds Radiation Exposure Index (if provided by the fluoroscopic device): 39.7 mGy Number of Acquired Spot Images: 0 COMPARISON:  None. FINDINGS: Fluoroscopic evaluation of swallowing was performed. This shows disruption of primary esophageal waves with stasis. No fixed esophageal stricture, fold thickening or mass. No reflux with the water siphon maneuver. The patient swallowed a 13 mm barium tablet which freely passed into the stomach. IMPRESSION: Esophageal dysmotility. No esophageal stricture. Electronically Signed   By: Rolm Baptise M.D.   On: 10/10/2016 16:02   US Carotid Bilateral  Result Date: 10/10/2016 CLINICAL DATA:  Left-sided weakness. Trouble ambulating. Recent stroke. EXAM: BILATERAL CAROTID DUPLEX ULTRASOUND TECHNIQUE: Pearline Cables scale imaging, color Doppler and duplex ultrasound were performed of bilateral carotid and vertebral arteries in the neck. COMPARISON:  10/06/2014  FINDINGS: Criteria: Quantification of carotid stenosis is based on velocity parameters that correlate the residual internal carotid diameter with NASCET-based stenosis levels, using the diameter of the distal internal carotid lumen as the denominator for stenosis measurement. The following velocity measurements were obtained: RIGHT ICA:  75 cm/sec CCA:  72 cm/sec SYSTOLIC ICA/CCA RATIO:  1.0 DIASTOLIC  ICA/CCA RATIO:  1.5 ECA:  87 cm/sec LEFT ICA:  78 cm/sec CCA:  69 cm/sec SYSTOLIC ICA/CCA RATIO:  1.1 DIASTOLIC ICA/CCA RATIO:  1.9 ECA:  84 cm/sec RIGHT CAROTID ARTERY: Small amount of plaque at the right carotid bulb and origin of the internal carotid artery. Plaque in the proximal internal carotid artery is mildly heterogeneous. Normal waveforms and velocities in the internal carotid artery. External carotid artery is patent with normal waveform. RIGHT VERTEBRAL ARTERY: Antegrade flow and normal waveform in the right vertebral artery. LEFT CAROTID ARTERY: Small amount of plaque at the left carotid bulb and proximal internal carotid artery. External carotid artery is patent with normal waveform. Normal waveforms and velocities in the internal carotid artery. LEFT VERTEBRAL ARTERY: Antegrade flow and normal waveform in the left vertebral artery. IMPRESSION: Small amount of plaque at the carotid bulb and proximal internal carotid arteries bilaterally. Estimated degree of stenosis in the internal carotid arteries is less than 50% bilaterally. Patent vertebral arteries with antegrade flow. Electronically Signed   By: Markus Daft M.D.   On: 10/10/2016 11:48    Miel Wisener, DO  [facility] Pager [phone]  If 7PM-7AM, please contact night-coverage [email or web address] Password [email or web address] 10/11/2016, 3:25 PM   LOS: 4 days

## 2016-10-12 ENCOUNTER — Encounter: Payer: Self-pay | Admitting: Gastroenterology

## 2016-10-12 ENCOUNTER — Telehealth: Payer: Self-pay | Admitting: Gastroenterology

## 2016-10-12 LAB — GLUCOSE, CAPILLARY
GLUCOSE-CAPILLARY: 94 mg/dL (ref 65–99)
Glucose-Capillary: 120 mg/dL — ABNORMAL HIGH (ref 65–99)
Glucose-Capillary: 180 mg/dL — ABNORMAL HIGH (ref 65–99)
Glucose-Capillary: 216 mg/dL — ABNORMAL HIGH (ref 65–99)

## 2016-10-12 LAB — BASIC METABOLIC PANEL
Anion gap: 6 (ref 5–15)
BUN: 12 mg/dL (ref 6–20)
CO2: 35 mmol/L — ABNORMAL HIGH (ref 22–32)
CREATININE: 0.97 mg/dL (ref 0.44–1.00)
Calcium: 8.2 mg/dL — ABNORMAL LOW (ref 8.9–10.3)
Chloride: 97 mmol/L — ABNORMAL LOW (ref 101–111)
GFR calc Af Amer: >60 mL/min (ref >60)
GLUCOSE: 95 mg/dL (ref 65–99)
POTASSIUM: 2.9 mmol/L — AB (ref 3.5–5.1)
Sodium: 138 mmol/L (ref 135–145)

## 2016-10-12 LAB — MAGNESIUM: Magnesium: 1.7 mg/dL (ref 1.7–2.4)

## 2016-10-12 LAB — BRAIN NATRIURETIC PEPTIDE: B Natriuretic Peptide: 897 pg/mL — ABNORMAL HIGH (ref 0.0–100.0)

## 2016-10-12 LAB — HOMOCYSTEINE: HOMOCYSTEINE-NORM: 23.7 umol/L — AB (ref 0.0–15.0)

## 2016-10-12 LAB — RPR: RPR: NONREACTIVE

## 2016-10-12 MED ORDER — MAGNESIUM SULFATE 2 GM/50ML IV SOLN
2.0000 g | Freq: Once | INTRAVENOUS | Status: AC
  Administered 2016-10-12: 2 g via INTRAVENOUS
  Filled 2016-10-12: qty 50

## 2016-10-12 MED ORDER — POTASSIUM CHLORIDE CRYS ER 20 MEQ PO TBCR
40.0000 meq | EXTENDED_RELEASE_TABLET | Freq: Two times a day (BID) | ORAL | Status: DC
  Administered 2016-10-12: 40 meq via ORAL
  Filled 2016-10-12: qty 2

## 2016-10-12 MED ORDER — POTASSIUM CHLORIDE CRYS ER 20 MEQ PO TBCR: 40.0000 meq | EXTENDED_RELEASE_TABLET | ORAL | Status: DC

## 2016-10-12 MED ORDER — POTASSIUM CHLORIDE CRYS ER 20 MEQ PO TBCR
40.0000 meq | EXTENDED_RELEASE_TABLET | Freq: Three times a day (TID) | ORAL | Status: AC
  Administered 2016-10-12 – 2016-10-14 (×6): 40 meq via ORAL
  Filled 2016-10-12 (×6): qty 2

## 2016-10-12 MED ORDER — FOLIC ACID 1 MG PO TABS
1.0000 mg | ORAL_TABLET | Freq: Every day | ORAL | Status: DC
  Administered 2016-10-12 – 2016-10-14 (×3): 1 mg via ORAL
  Filled 2016-10-12 (×3): qty 1

## 2016-10-12 MED ORDER — CARVEDILOL 3.125 MG PO TABS
3.1250 mg | ORAL_TABLET | Freq: Two times a day (BID) | ORAL | Status: DC
  Administered 2016-10-12 – 2016-10-14 (×5): 3.125 mg via ORAL
  Filled 2016-10-12 (×5): qty 1

## 2016-10-12 NOTE — Progress Notes (Signed)
Progress Note  Patient Name: Monica Carlson Date of Encounter: 10/12/2016  Primary Cardiologist: Dr. Bronson Ing  Subjective   Breathing easier, still feels like thighs are tight.  Inpatient Medications    Scheduled Meds: . aspirin EC  325 mg Oral Daily  . atorvastatin  80 mg Oral q1800  . citalopram  20 mg Oral Daily  . clopidogrel  75 mg Oral Q breakfast  . enoxaparin (LOVENOX) injection  40 mg Subcutaneous Q24H  . fenofibrate  160 mg Oral Daily  . furosemide  40 mg Intravenous BID  . gabapentin  300 mg Oral BID  . insulin aspart  0-5 Units Subcutaneous QHS  . insulin aspart  0-9 Units Subcutaneous TID WC  . levothyroxine  50 mcg Oral QAC breakfast  . magnesium oxide  400 mg Oral Daily  . niacin  500 mg Oral Daily  . pantoprazole  40 mg Oral Daily  . potassium chloride  20 mEq Oral BID  . sodium chloride flush  3 mL Intravenous Q12H   Continuous Infusions: . sodium chloride     PRN Meds: sodium chloride, acetaminophen, ondansetron (ZOFRAN) IV, sodium chloride flush   Vital Signs    Vitals:   10/11/16 1424 10/11/16 2144 10/12/16 0000 10/12/16 0500  BP: 131/83 126/80 120/76 (!) 118/92  Pulse: 86 87 90 86  Resp: 16 20 18 18   Temp: 98.4 F (36.9 C) 97.9 F (36.6 C) 98 F (36.7 C) 98.4 F (36.9 C)  TempSrc: Oral Oral Oral Oral  SpO2: 94% 95%  94%  Weight:    202 lb 2.6 oz (91.7 kg)  Height:        Intake/Output Summary (Last 24 hours) at 10/12/16 0926 Last data filed at 10/11/16 2315  Gross per 24 hour  Intake              240 ml  Output             5000 ml  Net            -4760 ml   Filed Weights   10/10/16 0625 10/11/16 0628 10/12/16 0500  Weight: 217 lb 13 oz (98.8 kg) 212 lb 15.4 oz (96.6 kg) 202 lb 2.6 oz (91.7 kg)    Telemetry    NSR - Personally Reviewed  ECG     Physical Exam    GEN: No acute distress.   Neck: slight increas JVD Cardiac: RRR, no murmurs, rubs, or gallops.  Respiratory: basilar crackles right, clear on left GI: Soft,  nontender, non-distended  MS: plus 2 bilateral edema Neuro:  Nonfocal  Psych: Normal affect   Labs    Chemistry Recent Labs Lab 10/07/16 0213  10/10/16 0603 10/11/16 0651 10/12/16 0720  NA 136  < > 138 136 138  K 3.9  < > 3.5 3.0* 2.9*  CL 101  < > 99* 98* 97*  CO2 28  < > 26 29 35*  GLUCOSE 160*  < > 81 98 95  BUN 11  < > 19 17 12   CREATININE 0.82  < > 1.46* 1.24* 0.97  CALCIUM 8.6*  < > 8.4* 8.1* 8.2*  PROT 6.1*  --   --   --   --   ALBUMIN 2.9*  --   --   --   --   AST 15  --   --   --   --   ALT 11*  --   --   --   --  ALKPHOS 55  --   --   --   --   BILITOT 1.3*  --   --   --   --   GFRNONAA >60  < > 41* 49* >60  GFRAA >60  < > 47* 57* >60  ANIONGAP 7  < > 13 9 6   < > = values in this interval not displayed.   Hematology Recent Labs Lab 10/07/16 0213 10/11/16 0651  WBC 7.5 9.7  RBC 4.71 4.50  HGB 14.7 13.9  HCT 45.2 42.6  MCV 96.0 94.7  MCH 31.2 30.9  MCHC 32.5 32.6  RDW 15.6* 16.1*  PLT 224 244    Cardiac EnzymesNo results for input(s): TROPONINI in the last 168 hours. No results for input(s): TROPIPOC in the last 168 hours.   BNP Recent Labs Lab 10/07/16 0213 10/12/16 0720  BNP 944.0* 897.0*     DDimer No results for input(s): DDIMER in the last 168 hours.   Radiology    Dg Esophagus  Result Date: 10/10/2016 CLINICAL DATA:  Dysphagia EXAM: ESOPHOGRAM/BARIUM SWALLOW TECHNIQUE: Single contrast examination was performed using  thin barium. FLUOROSCOPY TIME:  Fluoroscopy Time:  2 minutes 6 seconds Radiation Exposure Index (if provided by the fluoroscopic device): 39.7 mGy Number of Acquired Spot Images: 0 COMPARISON:  None. FINDINGS: Fluoroscopic evaluation of swallowing was performed. This shows disruption of primary esophageal waves with stasis. No fixed esophageal stricture, fold thickening or mass. No reflux with the water siphon maneuver. The patient swallowed a 13 mm barium tablet which freely passed into the stomach. IMPRESSION: Esophageal  dysmotility. No esophageal stricture. Electronically Signed   By: Rolm Baptise M.D.   On: 10/10/2016 16:02   US Carotid Bilateral  Result Date: 10/10/2016 CLINICAL DATA:  Left-sided weakness. Trouble ambulating. Recent stroke. EXAM: BILATERAL CAROTID DUPLEX ULTRASOUND TECHNIQUE: Pearline Cables scale imaging, color Doppler and duplex ultrasound were performed of bilateral carotid and vertebral arteries in the neck. COMPARISON:  10/06/2014 FINDINGS: Criteria: Quantification of carotid stenosis is based on velocity parameters that correlate the residual internal carotid diameter with NASCET-based stenosis levels, using the diameter of the distal internal carotid lumen as the denominator for stenosis measurement. The following velocity measurements were obtained: RIGHT ICA:  75 cm/sec CCA:  72 cm/sec SYSTOLIC ICA/CCA RATIO:  1.0 DIASTOLIC ICA/CCA RATIO:  1.5 ECA:  87 cm/sec LEFT ICA:  78 cm/sec CCA:  69 cm/sec SYSTOLIC ICA/CCA RATIO:  1.1 DIASTOLIC ICA/CCA RATIO:  1.9 ECA:  84 cm/sec RIGHT CAROTID ARTERY: Small amount of plaque at the right carotid bulb and origin of the internal carotid artery. Plaque in the proximal internal carotid artery is mildly heterogeneous. Normal waveforms and velocities in the internal carotid artery. External carotid artery is patent with normal waveform. RIGHT VERTEBRAL ARTERY: Antegrade flow and normal waveform in the right vertebral artery. LEFT CAROTID ARTERY: Small amount of plaque at the left carotid bulb and proximal internal carotid artery. External carotid artery is patent with normal waveform. Normal waveforms and velocities in the internal carotid artery. LEFT VERTEBRAL ARTERY: Antegrade flow and normal waveform in the left vertebral artery. IMPRESSION: Small amount of plaque at the carotid bulb and proximal internal carotid arteries bilaterally. Estimated degree of stenosis in the internal carotid arteries is less than 50% bilaterally. Patent vertebral arteries with antegrade flow.  Electronically Signed   By: Markus Daft M.D.   On: 10/10/2016 11:48    Cardiac Studies   Echo 10/07/16- Study Conclusions   - Left ventricle: The cavity size  was mildly dilated. Wall   thickness was increased in a pattern of mild LVH. Systolic   function was severely reduced. The estimated ejection fraction   was 20%. Diffuse hypokinesis. Features are consistent with a   pseudonormal left ventricular filling pattern, with concomitant   abnormal relaxation and increased filling pressure (grade 2   diastolic dysfunction). Doppler parameters are consistent with   high ventricular filling pressure. - Regional wall motion abnormality: Akinesis of the mid anterior,   mid anteroseptal, basal-mid inferior, and mid anterolateral   myocardium; severe hypokinesis of the apical inferior myocardium. - Aortic valve: Trileaflet; mildly calcified leaflets. - Mitral valve: There was moderate regurgitation. - Right ventricle: Systolic function was moderately to severely   reduced. - Tricuspid valve: There was moderate regurgitation. - Pulmonary arteries: PA peak pressure: 34 mm Hg (S). - Inferior vena cava: The vessel was dilated. The respirophasic   diameter changes were blunted (< 50%), consistent with elevated   central venous pressure.   Carotid US 10/10/16- IMPRESSION: Small amount of plaque at the carotid bulb and proximal internal carotid arteries bilaterally. Estimated degree of stenosis in the internal carotid arteries is less than 50% bilaterally.   Patent vertebral arteries with antegrade flow.       Patient Profile     51 y.o. female, lives alone in [place], with a history of HTN, DM, and CAD admitted 10/07/16 with edema and dyspnea. She was found to have new CM with an EF of 20% and bilateral CVA.   Assessment & Plan     Acute combined systolic and diastolic CHF- EF 23% with multiple WMA and grade 2 DD. She has diuresed 8.2L, wgt 220 on adm down to 202 lbs today.. Still  needs continued diuresis. Potassium 2.9 today needs replaced.Will order.   Cardiomyopathy- Presumable ischemic with WMA and history of CAD. She denies any anginal symptoms and no Troponin drawn this admission. Plan is for OP ischemic work up. Will start low dose Coreg today. Renal function has normaized   CAD- History of RCA BMS 2004. Myoview low risk and EF 55% May 2017   Bilateral CVA- Possibly secondary to SVD but undetected PAF also a possibility, consider OP 30 day event at discharge   Uncontrolled DM- Hgb A1c 9.9 on admission   HTN- With HCVD-grade 2 DD   Elevated TSH- Pt says she was on thyroid medication at some point but hasn't taken it lately (? compliance with other medications). Synthroid adjusted by primary service   HLD- LDL 106-on statin     Signed, Ermalinda Barrios, PA-C  10/12/2016, 9:26 AM    The patient was seen and examined, and I agree with the history, physical exam, assessment and plan as documented above, with modifications as noted below.  She is doing better and has had nearly 7 L out in the past 24+ hrs on IV Lasix 40 mg BID. Still complains of bilateral thigh tightness. Renal function has normalized.  Recommendations: I will discontinue urinary catheter so as to avoid development of catheter-associated UTI. Continue IV Lasix 40 mg BID for now (perhaps switch to oral on 8/23). Replace KCl. I will start carvedilol 3.125 mg BID, with plans for initiating Entresto in outpatient setting. I will plan for outpatient ischemic workup.    Kate Sable, MD, [facility]  10/12/2016 9:46 AM

## 2016-10-12 NOTE — Telephone Encounter (Signed)
Please schedule hospital f/u in 8-10 weeks for dysphagia, ?consider egd/tcs.

## 2016-10-12 NOTE — Telephone Encounter (Signed)
APPT MADE AND LETTER SENT  °

## 2016-10-12 NOTE — Progress Notes (Signed)
Subjective:  Resting. Easily awakens. No complaints. Denies problems swallowing with meals yesterday. Has not had breakfast. Not hungry. No abd pain.   Objective: Vital signs in last 24 hours: Temp:  [97.9 F (36.6 C)-98.4 F (36.9 C)] 98.4 F (36.9 C) (08/22 0500) Pulse Rate:  [86-90] 86 (08/22 0500) Resp:  [16-20] 18 (08/22 0500) BP: (118-131)/(76-92) 118/92 (08/22 0500) SpO2:  [94 %-95 %] 94 % (08/22 0500) Weight:  [202 lb 2.6 oz (91.7 kg)] 202 lb 2.6 oz (91.7 kg) (08/22 0500) Last BM Date: 10/11/16 General:   Alert,  Well-developed, well-nourished, pleasant and cooperative in NAD Head:  Normocephalic and atraumatic. Eyes:  Sclera clear, no icterus.  Abdomen:  Soft, nontender and nondistended.  Normal bowel sounds, without guarding, and without rebound.    Neurologic:  Alert and  oriented x4;  grossly normal neurologically. Skin:  Intact without significant lesions or rashes. Psych:  Alert and cooperative. Normal mood and affect.  Intake/Output from previous day: 08/21 0701 - 08/22 0700 In: 480 [P.O.:480] Out: 5000 [Urine:5000] Intake/Output this shift: No intake/output data recorded.  Lab Results: CBC  Recent Labs  10/11/16 0651  WBC 9.7  HGB 13.9  HCT 42.6  MCV 94.7  PLT 244   BMET  Recent Labs  10/10/16 0603 10/11/16 0651 10/12/16 0720  NA 138 136 138  K 3.5 3.0* 2.9*  CL 99* 98* 97*  CO2 26 29 35*  GLUCOSE 81 98 95  BUN 19 17 12   CREATININE 1.46* 1.24* 0.97  CALCIUM 8.4* 8.1* 8.2*   LFTs No results for input(s): BILITOT, BILIDIR, IBILI, ALKPHOS, AST, ALT, PROT, ALBUMIN in the last 72 hours. No results for input(s): LIPASE in the last 72 hours. PT/INR No results for input(s): LABPROT, INR in the last 72 hours.    Imaging Studies: Dg Chest 2 View  Result Date: 10/07/2016 CLINICAL DATA:  Bilateral leg swelling, nonproductive cough since July. EXAM: CHEST  2 VIEW COMPARISON:  Chest radiograph September 17, 2016 FINDINGS: Cardiac silhouette is similarly  enlarged, mediastinal silhouette is nonsuspicious considering AP technique. Pulmonary vascular congestion and mild interstitial prominence. Small pleural effusions are unchanged. Persistently elevated RIGHT hemidiaphragm. No pneumothorax. Soft tissue planes and included osseous structures are unchanged. IMPRESSION: Stable cardiomegaly. Interstitial prominence favoring pulmonary edema with small pleural effusions. Electronically Signed   By: Elon Alas M.D.   On: 10/07/2016 05:40   Ct Head Wo Contrast  Result Date: 10/09/2016 CLINICAL DATA:  LEFT extremity weakness beginning earlier today. Assess stroke. History of hypertension, hypercholesterolemia and diabetes. EXAM: CT HEAD WITHOUT CONTRAST TECHNIQUE: Contiguous axial images were obtained from the base of the skull through the vertex without intravenous contrast. COMPARISON:  MRI of the head October 03, 2014 FINDINGS: BRAIN: Faint blurring of the RIGHT temporal gray-white matter differentiation with subtle RIGHT insular ribbon sign. Old bilateral basal ganglia and infarcts. Old small LEFT occipital lobe infarct. Patchy pontine hypodensity (axial 8/31). Old LEFT thalamus lacunar infarct. Focal RIGHT frontal gliosis is unchanged is mild ex vacuo dilatation RIGHT frontal horn of lateral ventricle. No hydrocephalus. No intraparenchymal hemorrhage, mass effect or midline shift. No abnormal extra-axial fluid collections. VASCULAR: Moderate calcific atherosclerosis the carotid siphons. SKULL/SOFT TISSUES: No skull fracture. No significant soft tissue swelling. ORBITS/SINUSES: The included ocular globes and orbital contents are normal.The mastoid aircells and included paranasal sinuses are well-aerated. OTHER: None. IMPRESSION: 1. Acute suspected small RIGHT MCA territory infarct. 2. Acute pontine infarct versus artifact. 3. Old bilateral basal ganglia, LEFT thalamus infarcts and old  small LEFT occipital lobe/PCA territory infarct. 4. Moderate atherosclerosis,  advanced for age. These results will be called to the ordering clinician or representative by the Radiologist Assistant, and communication documented in the PACS or zVision Dashboard. Electronically Signed   By: Elon Alas M.D.   On: 10/09/2016 22:21   Mr Virgel Paling FH Contrast  Result Date: 10/10/2016 CLINICAL DATA:  51 year old female with left side weakness and plain head CT findings suspicious for an acute right MCA territory infarct. EXAM: MRI HEAD WITHOUT AND WITH CONTRAST MRA HEAD WITHOUT CONTRAST TECHNIQUE: Multiplanar, multiecho pulse sequences of the brain and surrounding structures were obtained without and with intravenous contrast. Angiographic images of the head were obtained using MRA technique without contrast. CONTRAST:  67mL MULTIHANCE GADOBENATE DIMEGLUMINE 529 MG/ML IV SOLN COMPARISON:  Head CT without contrast 10/09/2016. [facility] brain MRI, head and neck MRA 10/03/2014. FINDINGS: MRI HEAD FINDINGS Brain: Multiple chronic lacunar type infarcts in the right basal ganglia, bilateral thalami, left corona radiata and left lentiform. Some associated gliosis and Wallerian degeneration. Chronic cortical encephalomalacia in the inferior left occipital pole. There is patchy diffusion abnormality along the posterior right corona radiata which appears mildly restricted (series 3, image 82 and series 4, image 31) with no significant T2 or FLAIR hyperintensity. There is a 9 mm area of cortically based more severely restricted diffusion in the left parietal lobe (series 3, image 85) with mild T2 and FLAIR hyperintensity. No other restricted diffusion. No associated hemorrhage or mass effect. No definite chronic cerebral blood products. Cerebral volume is not significantly changed since 2016. No evidence of mass lesion, ventriculomegaly, extra-axial collection or acute intracranial hemorrhage. Cerebellum is within normal limits. Cervicomedullary junction and pituitary are within normal  limits. No abnormal enhancement identified. Vascular: Major intracranial vascular flow voids are stable since 2016. Skull and upper cervical spine: Stable and negative. Sinuses/Orbits: Stable and negative. Other: Scalp and face soft tissues appear negative. MRA HEAD FINDINGS The IV MultiHance contrast was accidentally administered prior to the MRA, which results in venous vascular contamination on the MRA images. Chronic dominant distal left vertebral artery with bilateral distal vertebral irregularity and stenosis, worse on the right. Both distal vertebral arteries appear patent to the vertebrobasilar junction and stable compared to 2016. Stable basilar artery without significant stenosis. SCA and PCA origins remain patent. There is chronic left P1 segment stenosis. Grossly stable bilateral PCA branches. Preserved antegrade flow signal in both ICA siphons but chronic siphon irregularity and stenosis worse on the right. The siphons appears stable since 2016. Carotid termini remain patent. Stable MCA and left ACA origins. The right A1 segment is diminutive or absent. Anterior communicating artery and visible ACA branches are stable and within normal limits. MCA M1 segments are stable with mild irregularity. Both MCA by/trifurcations are patent. Stable visible bilateral MCA branches, with moderate irregularity. No MCA branch occlusion identified. IMPRESSION: 1. There is a small acute infarct in the left parietal cortex, and also a small subacute infarct in the right corona radiata. No associated hemorrhage or mass effect. 2. Underlying chronically advanced ischemic disease, with severe chronic small vessel ischemia in and around the bilateral deep gray matter nuclei, and a chronic left PCA infarct. 3. Today's intracranial MRA is degraded by venous contamination but appears stable to a 2016 MRA, with widespread large vessel atherosclerosis and chronic stenoses of both distal vertebral arteries, the Right ICA siphon,  and the Left PCA. 4. The constellation of findings suggests the ischemia in #1 is  due to synchronous small vessel disease. Electronically Signed   By: Genevie Ann M.D.   On: 10/10/2016 09:45   Mr Jeri Cos DG Contrast  Result Date: 10/10/2016 CLINICAL DATA:  51 year old female with left side weakness and plain head CT findings suspicious for an acute right MCA territory infarct. EXAM: MRI HEAD WITHOUT AND WITH CONTRAST MRA HEAD WITHOUT CONTRAST TECHNIQUE: Multiplanar, multiecho pulse sequences of the brain and surrounding structures were obtained without and with intravenous contrast. Angiographic images of the head were obtained using MRA technique without contrast. CONTRAST:  24mL MULTIHANCE GADOBENATE DIMEGLUMINE 529 MG/ML IV SOLN COMPARISON:  Head CT without contrast 10/09/2016. [facility] brain MRI, head and neck MRA 10/03/2014. FINDINGS: MRI HEAD FINDINGS Brain: Multiple chronic lacunar type infarcts in the right basal ganglia, bilateral thalami, left corona radiata and left lentiform. Some associated gliosis and Wallerian degeneration. Chronic cortical encephalomalacia in the inferior left occipital pole. There is patchy diffusion abnormality along the posterior right corona radiata which appears mildly restricted (series 3, image 82 and series 4, image 31) with no significant T2 or FLAIR hyperintensity. There is a 9 mm area of cortically based more severely restricted diffusion in the left parietal lobe (series 3, image 85) with mild T2 and FLAIR hyperintensity. No other restricted diffusion. No associated hemorrhage or mass effect. No definite chronic cerebral blood products. Cerebral volume is not significantly changed since 2016. No evidence of mass lesion, ventriculomegaly, extra-axial collection or acute intracranial hemorrhage. Cerebellum is within normal limits. Cervicomedullary junction and pituitary are within normal limits. No abnormal enhancement identified. Vascular: Major intracranial  vascular flow voids are stable since 2016. Skull and upper cervical spine: Stable and negative. Sinuses/Orbits: Stable and negative. Other: Scalp and face soft tissues appear negative. MRA HEAD FINDINGS The IV MultiHance contrast was accidentally administered prior to the MRA, which results in venous vascular contamination on the MRA images. Chronic dominant distal left vertebral artery with bilateral distal vertebral irregularity and stenosis, worse on the right. Both distal vertebral arteries appear patent to the vertebrobasilar junction and stable compared to 2016. Stable basilar artery without significant stenosis. SCA and PCA origins remain patent. There is chronic left P1 segment stenosis. Grossly stable bilateral PCA branches. Preserved antegrade flow signal in both ICA siphons but chronic siphon irregularity and stenosis worse on the right. The siphons appears stable since 2016. Carotid termini remain patent. Stable MCA and left ACA origins. The right A1 segment is diminutive or absent. Anterior communicating artery and visible ACA branches are stable and within normal limits. MCA M1 segments are stable with mild irregularity. Both MCA by/trifurcations are patent. Stable visible bilateral MCA branches, with moderate irregularity. No MCA branch occlusion identified. IMPRESSION: 1. There is a small acute infarct in the left parietal cortex, and also a small subacute infarct in the right corona radiata. No associated hemorrhage or mass effect. 2. Underlying chronically advanced ischemic disease, with severe chronic small vessel ischemia in and around the bilateral deep gray matter nuclei, and a chronic left PCA infarct. 3. Today's intracranial MRA is degraded by venous contamination but appears stable to a 2016 MRA, with widespread large vessel atherosclerosis and chronic stenoses of both distal vertebral arteries, the Right ICA siphon, and the Left PCA. 4. The constellation of findings suggests the ischemia in  #1 is due to synchronous small vessel disease. Electronically Signed   By: Genevie Ann M.D.   On: 10/10/2016 09:45   Dg Esophagus  Result Date: 10/10/2016 CLINICAL DATA:  Dysphagia EXAM: ESOPHOGRAM/BARIUM SWALLOW TECHNIQUE: Single contrast examination was performed using  thin barium. FLUOROSCOPY TIME:  Fluoroscopy Time:  2 minutes 6 seconds Radiation Exposure Index (if provided by the fluoroscopic device): 39.7 mGy Number of Acquired Spot Images: 0 COMPARISON:  None. FINDINGS: Fluoroscopic evaluation of swallowing was performed. This shows disruption of primary esophageal waves with stasis. No fixed esophageal stricture, fold thickening or mass. No reflux with the water siphon maneuver. The patient swallowed a 13 mm barium tablet which freely passed into the stomach. IMPRESSION: Esophageal dysmotility. No esophageal stricture. Electronically Signed   By: Rolm Baptise M.D.   On: 10/10/2016 16:02   US Carotid Bilateral  Result Date: 10/10/2016 CLINICAL DATA:  Left-sided weakness. Trouble ambulating. Recent stroke. EXAM: BILATERAL CAROTID DUPLEX ULTRASOUND TECHNIQUE: Pearline Cables scale imaging, color Doppler and duplex ultrasound were performed of bilateral carotid and vertebral arteries in the neck. COMPARISON:  10/06/2014 FINDINGS: Criteria: Quantification of carotid stenosis is based on velocity parameters that correlate the residual internal carotid diameter with NASCET-based stenosis levels, using the diameter of the distal internal carotid lumen as the denominator for stenosis measurement. The following velocity measurements were obtained: RIGHT ICA:  75 cm/sec CCA:  72 cm/sec SYSTOLIC ICA/CCA RATIO:  1.0 DIASTOLIC ICA/CCA RATIO:  1.5 ECA:  87 cm/sec LEFT ICA:  78 cm/sec CCA:  69 cm/sec SYSTOLIC ICA/CCA RATIO:  1.1 DIASTOLIC ICA/CCA RATIO:  1.9 ECA:  84 cm/sec RIGHT CAROTID ARTERY: Small amount of plaque at the right carotid bulb and origin of the internal carotid artery. Plaque in the proximal internal carotid  artery is mildly heterogeneous. Normal waveforms and velocities in the internal carotid artery. External carotid artery is patent with normal waveform. RIGHT VERTEBRAL ARTERY: Antegrade flow and normal waveform in the right vertebral artery. LEFT CAROTID ARTERY: Small amount of plaque at the left carotid bulb and proximal internal carotid artery. External carotid artery is patent with normal waveform. Normal waveforms and velocities in the internal carotid artery. LEFT VERTEBRAL ARTERY: Antegrade flow and normal waveform in the left vertebral artery. IMPRESSION: Small amount of plaque at the carotid bulb and proximal internal carotid arteries bilaterally. Estimated degree of stenosis in the internal carotid arteries is less than 50% bilaterally. Patent vertebral arteries with antegrade flow. Electronically Signed   By: Markus Daft M.D.   On: 10/10/2016 11:48  [2 weeks]   Assessment: 51 year old female with acute on chronic heart failure, acute stroke while inpatient, and reports of globus sensation with BPE noting esophageal dysmotility. No stricture noted. No urgent need for EGD at this time in light of other multiple medical issues. PPI added to regimen.  Per Speech therapy, ok for D3/mechanical soft diet.     Plan: 1. PPI daily.  2. Plan for outpatient evaluation, ie EGD.  3. Dysphagia 3 diet.  4. Will follow peripherally.   Laureen Ochs. Bernarda Caffey [facility] [phone] 8/22/20189:23 AM     LOS: 5 days

## 2016-10-12 NOTE — Progress Notes (Signed)
PT Cancellation Note  Patient Details Name: Monica Carlson MRN: [id number] DOB: 1965-02-29   Cancelled Treatment:     PT session medically contraindicated at this time due to acute hypokalemia (2.9). Chart reviewed, RN consulted: RN reports order for PO potassium, which has not yet been started. Will continue to monitor remotely and resume treatment when patient is medically appropriate.  11:43 AM, 10/12/16 Etta Grandchild, PT, DPT Physical Therapist - [place] [phone] (Office)      Tineshia Becraft C 10/12/2016, 11:41 AM

## 2016-10-12 NOTE — Progress Notes (Signed)
[place] TEAM 3  Monica Carlson  OHY:[id number] DOB: 1965-11-25 DOA: 10/07/2016 PCP: [facility] [place]    Brief Narrative:  51 y.o.femalewith a history of HTN, DM, CAD, and CHF who presented with a week long history of shortness of breath, leg edema, and dyspnea on exertion. She was admitted to [facility] in late July, and discharged with a fluid pill.  Over the week preceding this admit she noted she was not urinating much, so she began drinkingmore fluid.    In the ED BNP was 944. Chest x-ray showed interstitial prominence with small bilateral pleural effusions. The patient was started on IV lasix.  TTE showed EF 20%. Her hospitalization was complicated by acute ischemic stroke. Neurology was consulted to assist.  Subjective: The patient is sitting up at the bedside.  She denies chest pain shortness breath fevers chills nausea or vomiting.  She does note that she has some persisting swelling in her legs and feet.  Assessment & Plan:  Acute on chronic systolic and diastolic CHF TTE noted EF 20%, diffuse HK, grade 2 DD, significant regional wall motion abnormalities, and ecreased RV function - net negative ~11L since admit - suspect ischemic in nature - Cards to complete coronary eval as outpt - diuretic dosing per Cards   Filed Weights   10/10/16 0625 10/11/16 0628 10/12/16 0500  Weight: 98.8 kg (217 lb 13 oz) 96.6 kg (212 lb 15.4 oz) 91.7 kg (202 lb 2.6 oz)    Acute ischemic stroke LUE & LLE weakness started AM of 8/19 - CT brain noted new small right MCA infarct, acute pontine infarct - Neurology recommends ASA+Plavix x 90 days, then ASA alone - needs 30 day event monitor after discharge - additional workup has included:  -MRI brain:  acute infarct left parietal cortex; subacute right corona radiata infarct -MRA brain:  chronic bilateral distal vertebral stenosis -Carotid Duplex:  negative for hemodynamically significant stenosis -LDL 106 - increased lipitor to 80 mg  daily -A1C 9.9  AKI crt has normalized - cont to hold ACEi for now - follow w/ ongoing diuresis   DM2 uncontrolled  10/07/16 A1c 9.9  -well controlled as inpt - suspect med or diet noncompliance at home - educate   Hyperlipidemia Medical tx adjusted this admit   Dysphagia SLP cleared for a dysphagia 2 with thin liquid diet - esophagram noted esophageal dysmotility - GI to follow pt as outpt   Essential hypertension BP well controlled at this time   Coronary artery disease with hx MI 10/01/2004BMS to mid RCA, EF 69% - continue aspirin and BB  Hypothyroidism TSH 17.57 - increased synthroiod dose to 50 mcg daily  Hypokalemia Persists - continue to supplement and follow  Hypomagnesemia Corrected with supplementation  DVT prophylaxis: lovenox  Code Status: FULL CODE Family Communication: no family present at time of exam  Disposition Plan: awaiting PT/OT evals - approaching medical stability for d/c   Consultants:  Cardiology  Neurology GI  Procedures: none  Antimicrobials:  none  Objective: Blood pressure (!) 118/92, pulse 86, temperature 98.4 F (36.9 C), temperature source Oral, resp. rate 18, height 5\' 1"  (1.549 m), weight 91.7 kg (202 lb 2.6 oz), SpO2 94 %.  Intake/Output Summary (Last 24 hours) at 10/12/16 1335 Last data filed at 10/12/16 1200  Gross per 24 hour  Intake                0 ml  Output  7600 ml  Net            -7600 ml   Filed Weights   10/10/16 0625 10/11/16 0628 10/12/16 0500  Weight: 98.8 kg (217 lb 13 oz) 96.6 kg (212 lb 15.4 oz) 91.7 kg (202 lb 2.6 oz)    Examination: General: No acute respiratory distress Lungs: Clear to auscultation bilaterally without wheezes or crackles Cardiovascular: Regular rate and rhythm without murmur gallop or rub normal S1 and S2 Abdomen: Nontender, nondistended, soft, bowel sounds positive, no rebound, no ascites, no appreciable mass Extremities: 2+ B LE edema   CBC:  Recent  Labs Lab 10/07/16 0213 10/11/16 0651  WBC 7.5 9.7  NEUTROABS 3.8  --   HGB 14.7 13.9  HCT 45.2 42.6  MCV 96.0 94.7  PLT 224 657   Basic Metabolic Panel:  Recent Labs Lab 10/08/16 0628 10/09/16 0614 10/10/16 0603 10/11/16 0651 10/12/16 0720  NA 136 135 138 136 138  K 3.8 3.6 3.5 3.0* 2.9*  CL 100* 100* 99* 98* 97*  CO2 27 25 26 29  35*  GLUCOSE 134* 105* 81 98 95  BUN 14 17 19 17 12   CREATININE 1.02* 1.40* 1.46* 1.24* 0.97  CALCIUM 8.6* 8.6* 8.4* 8.1* 8.2*  MG 1.6* 1.7 1.8 1.6* 1.7   GFR: Estimated Creatinine Clearance: 70.8 mL/min (by C-G formula based on SCr of 0.97 mg/dL).  Liver Function Tests:  Recent Labs Lab 10/07/16 0213  AST 15  ALT 11*  ALKPHOS 55  BILITOT 1.3*  PROT 6.1*  ALBUMIN 2.9*    HbA1C: Hgb A1c MFr Bld  Date/Time Value Ref Range Status  10/07/2016 08:34 AM 9.9 (H) 4.8 - 5.6 % Final    Comment:    (NOTE) Pre diabetes:          5.7%-6.4% Diabetes:              >6.4% Glycemic control for   <7.0% adults with diabetes   10/23/2015 10:09 AM 12.7 (H) <5.7 % Final    Comment:      For someone without known diabetes, a hemoglobin A1c value of 6.5% or greater indicates that they may have diabetes and this should be confirmed with a follow-up test.   For someone with known diabetes, a value <7% indicates that their diabetes is well controlled and a value greater than or equal to 7% indicates suboptimal control. A1c targets should be individualized based on duration of diabetes, age, comorbid conditions, and other considerations.   Currently, no consensus exists for use of hemoglobin A1c for diagnosis of diabetes for children.       CBG:  Recent Labs Lab 10/11/16 1129 10/11/16 1628 10/11/16 2209 10/12/16 0733 10/12/16 1135  GLUCAP 90 137* 182* 94 120*    Scheduled Meds: . aspirin EC  325 mg Oral Daily  . atorvastatin  80 mg Oral q1800  . carvedilol  3.125 mg Oral BID WC  . citalopram  20 mg Oral Daily  . clopidogrel  75  mg Oral Q breakfast  . enoxaparin (LOVENOX) injection  40 mg Subcutaneous Q24H  . fenofibrate  160 mg Oral Daily  . furosemide  40 mg Intravenous BID  . gabapentin  300 mg Oral BID  . insulin aspart  0-5 Units Subcutaneous QHS  . insulin aspart  0-9 Units Subcutaneous TID WC  . levothyroxine  50 mcg Oral QAC breakfast  . magnesium oxide  400 mg Oral Daily  . niacin  500 mg Oral Daily  .  pantoprazole  40 mg Oral Daily  . potassium chloride  40 mEq Oral BID  . potassium chloride  40 mEq Oral Q4H  . sodium chloride flush  3 mL Intravenous Q12H     LOS: 5 days   Cherene Altes, MD [facility] Office  [phone] Pager - Text Page per Shea Evans as per below:  On-Call/Text Page:      [email or web address]      password [email or web address]  If 7PM-7AM, please contact night-coverage [email or web address] Password [email or web address] 10/12/2016, 1:35 PM

## 2016-10-13 ENCOUNTER — Inpatient Hospital Stay (HOSPITAL_COMMUNITY): Payer: Medicaid Other

## 2016-10-13 DIAGNOSIS — I639 Cerebral infarction, unspecified: Secondary | ICD-10-CM

## 2016-10-13 LAB — BASIC METABOLIC PANEL
Anion gap: 7 (ref 5–15)
BUN: 13 mg/dL (ref 6–20)
CHLORIDE: 94 mmol/L — AB (ref 101–111)
CO2: 35 mmol/L — AB (ref 22–32)
CREATININE: 0.96 mg/dL (ref 0.44–1.00)
Calcium: 8.6 mg/dL — ABNORMAL LOW (ref 8.9–10.3)
GFR calc Af Amer: >60 mL/min (ref >60)
GFR calc non Af Amer: >60 mL/min (ref >60)
GLUCOSE: 133 mg/dL — AB (ref 65–99)
Potassium: 4.6 mmol/L (ref 3.5–5.1)
Sodium: 136 mmol/L (ref 135–145)

## 2016-10-13 LAB — GLUCOSE, CAPILLARY
GLUCOSE-CAPILLARY: 141 mg/dL — AB (ref 65–99)
GLUCOSE-CAPILLARY: 154 mg/dL — AB (ref 65–99)
GLUCOSE-CAPILLARY: 168 mg/dL — AB (ref 65–99)
Glucose-Capillary: 225 mg/dL — ABNORMAL HIGH (ref 65–99)

## 2016-10-13 LAB — MAGNESIUM: Magnesium: 1.9 mg/dL (ref 1.7–2.4)

## 2016-10-13 NOTE — Care Management Note (Signed)
Case Management Note  Patient Details  Name: Monica Carlson MRN: [id number] Date of Birth: [birth year]   If discussed at Long Length of Stay Meetings, dates discussed:  10/13/2016   Sherald Barge, RN 10/13/2016, 12:48 PM

## 2016-10-13 NOTE — Progress Notes (Signed)
Physical Therapy Treatment Patient Details Name: Monica Carlson MRN: [id number] DOB: [birth year] Today's Date: 10/13/2016    History of Present Illness Earlyne Feeser is a 51 y.o. female with medical history of essential hypertension, diabetes mellitus, coronary artery disease, and CHF presented with 1-2 week history of shortness of breath, leg edema, and dyspnea on exertion. The patient states that she was admitted to [facility] the third week of July. She states that she was discharged with a fluid pill with which she has been compliant. However, in the past week she states that she has not been urinating as much. Therefore, she has been checking more fluid in hopes to make herself urinate more. She has had difficulty laying flat. For the past month, she has had to sleep sitting up in a recliner. She denies any fevers, chills, headache, neck pain, chest pain, nausea, vomiting, diarrhea, dysuria. She states that she does not have a scale to weigh herself.    PT Comments    Patient presents with new rolling walker in her room to take home.  Patient had difficulty going up down 3 steps using SPC, had to use left siderail, states she has poles at home to pull herself up to climb up 2 steps at home, demonstrates good return for use of FWW x 100 feet with Supervision, required occasional contact guard using SPC due to drifting to the left.   Follow Up Recommendations  Supervision - Intermittent     Equipment Recommendations  Rolling walker with 5" wheels    Recommendations for Other Services       Precautions / Restrictions Precautions Precautions: Fall Precaution Comments: Patient tends to lean on walls and nearby objects for support Restrictions Weight Bearing Restrictions: No    Mobility  Bed Mobility Overal bed mobility: Modified Independent Bed Mobility: Supine to Sit     Supine to sit: Modified independent (Device/Increase time) Sit to supine: Modified independent (Device/Increase time)       Transfers Overall transfer level: Needs assistance Equipment used: Rolling walker (2 wheeled) Transfers: Stand Pivot Transfers Sit to Stand: Supervision Stand pivot transfers: Supervision          Ambulation/Gait Ambulation/Gait assistance: Min guard Ambulation Distance (Feet): 125 Feet Assistive device: Standard walker;Straight cane Gait Pattern/deviations: Decreased step length - left;Decreased step length - right;Decreased stride length   Gait velocity interpretation: Below normal speed for age/gender General Gait Details: Patient has tendency to drift to the left and lean onto objects on her left side possibly due to lack of vision left eye   Stairs Stairs: Yes   Stair Management: One rail Right;With cane Number of Stairs: 3 General stair comments: Patient unsteady and required use of left siderail for safety   Wheelchair Mobility    Modified Rankin (Stroke Patients Only)       Balance Overall balance assessment: Needs assistance Sitting-balance support: Feet supported Sitting balance-Leahy Scale: Fair     Standing balance support: Bilateral upper extremity supported Standing balance-Leahy Scale: Fair                              Cognition Arousal/Alertness: Awake/alert Behavior During Therapy: WFL for tasks assessed/performed Overall Cognitive Status: Within Functional Limits for tasks assessed  Exercises General Exercises - Lower Extremity Ankle Circles/Pumps: Seated;Both;10 reps Long Arc Quad: Seated;Both;10 reps Hip Flexion/Marching: Seated;Both;10 reps    General Comments        Pertinent Vitals/Pain Pain Assessment: No/denies pain    Home Living Family/patient expects to be discharged to:: Private residence Living Arrangements: Alone Available Help at Discharge: Neighbor Type of Home: House Home Access: Stairs to enter Entrance Stairs-Rails: None Home Layout: One  level Home Equipment: Environmental consultant - 2 wheels Additional Comments: Patient issued rolling walker and in her room.    Prior Function Level of Independence: Independent          PT Goals (current goals can now be found in the care plan section) Acute Rehab PT Goals Patient Stated Goal: Return home with neighbor to check on her PT Goal Formulation: With patient Time For Goal Achievement: 10/21/16 Potential to Achieve Goals: Good Progress towards PT goals: Progressing toward goals    Frequency    Min 3X/week      PT Plan Current plan remains appropriate    Co-evaluation              AM-PAC PT "6 Clicks" Daily Activity  Outcome Measure  Difficulty turning over in bed (including adjusting bedclothes, sheets and blankets)?: None Difficulty moving from lying on back to sitting on the side of the bed? : None Difficulty sitting down on and standing up from a chair with arms (e.g., wheelchair, bedside commode, etc,.)?: None Help needed moving to and from a bed to chair (including a wheelchair)?: None Help needed walking in hospital room?: A Little Help needed climbing 3-5 steps with a railing? : A Little 6 Click Score: 22    End of Session Equipment Utilized During Treatment: Gait belt Activity Tolerance: Patient tolerated treatment well Patient left: in bed;with call bell/phone within reach Nurse Communication: Mobility status PT Visit Diagnosis: Unsteadiness on feet (R26.81);Other abnormalities of gait and mobility (R26.89);Muscle weakness (generalized) (M62.81)     Time: 8768-1157 PT Time Calculation (min) (ACUTE ONLY): 33 min  Charges:  $Gait Training: 23-37 mins                    G Codes:       1:44 PM, Oct 14, 2016 Lonell Grandchild, MPT Physical Therapist with [facility] 336 [phone] office [phone] mobile phone

## 2016-10-13 NOTE — Plan of Care (Signed)
Problem: Activity: Goal: Risk for activity intolerance will decrease Outcome: Progressing Patient states weakness has improved. Patient able to walk to [facility] with no complications. Patient states she is feeling better

## 2016-10-13 NOTE — Progress Notes (Signed)
Progress Note  Patient Name: Monica Carlson Date of Encounter: 10/13/2016  Primary Cardiologist: Bronson Ing  Subjective   Feeling better with less thigh tightness.  Inpatient Medications    Scheduled Meds: . aspirin EC  325 mg Oral Daily  . atorvastatin  80 mg Oral q1800  . carvedilol  3.125 mg Oral BID WC  . citalopram  20 mg Oral Daily  . clopidogrel  75 mg Oral Q breakfast  . enoxaparin (LOVENOX) injection  40 mg Subcutaneous Q24H  . fenofibrate  160 mg Oral Daily  . folic acid  1 mg Oral Daily  . furosemide  40 mg Intravenous BID  . gabapentin  300 mg Oral BID  . insulin aspart  0-5 Units Subcutaneous QHS  . insulin aspart  0-9 Units Subcutaneous TID WC  . levothyroxine  50 mcg Oral QAC breakfast  . magnesium oxide  400 mg Oral Daily  . niacin  500 mg Oral Daily  . pantoprazole  40 mg Oral Daily  . potassium chloride  40 mEq Oral TID   Continuous Infusions:  PRN Meds: acetaminophen, ondansetron (ZOFRAN) IV   Vital Signs    Vitals:   10/12/16 1406 10/12/16 1900 10/12/16 2112 10/13/16 0641  BP: 133/88  108/66 (!) 130/94  Pulse: 87  77 85  Resp: 16  18 20   Temp: 98.5 F (36.9 C)  98.4 F (36.9 C) 98.5 F (36.9 C)  TempSrc: Oral  Oral Oral  SpO2: 95% 93% 95% 95%  Weight:    194 lb 7.1 oz (88.2 kg)  Height:        Intake/Output Summary (Last 24 hours) at 10/13/16 1030 Last data filed at 10/13/16 0900  Gross per 24 hour  Intake              950 ml  Output             3250 ml  Net            -2300 ml   Filed Weights   10/11/16 0628 10/12/16 0500 10/13/16 0641  Weight: 212 lb 15.4 oz (96.6 kg) 202 lb 2.6 oz (91.7 kg) 194 lb 7.1 oz (88.2 kg)    Telemetry    NSR - Personally Reviewed  ECG      Physical Exam   GEN: No acute distress.   Neck: No JVD Cardiac: RRR, no murmurs, rubs, or gallops.  Respiratory:  Diminished on right halfway up GI: Soft, nontender, non-distended  MS: 1+ pitting b/l pretibial edema Neuro:  Nonfocal  Psych: Normal  affect   Labs    Chemistry Recent Labs Lab 10/07/16 0213  10/11/16 0651 10/12/16 0720 10/13/16 0601  NA 136  < > 136 138 136  K 3.9  < > 3.0* 2.9* 4.6  CL 101  < > 98* 97* 94*  CO2 28  < > 29 35* 35*  GLUCOSE 160*  < > 98 95 133*  BUN 11  < > 17 12 13   CREATININE 0.82  < > 1.24* 0.97 0.96  CALCIUM 8.6*  < > 8.1* 8.2* 8.6*  PROT 6.1*  --   --   --   --   ALBUMIN 2.9*  --   --   --   --   AST 15  --   --   --   --   ALT 11*  --   --   --   --   ALKPHOS 55  --   --   --   --  BILITOT 1.3*  --   --   --   --   GFRNONAA >60  < > 49* >60 >60  GFRAA >60  < > 57* >60 >60  ANIONGAP 7  < > 9 6 7   < > = values in this interval not displayed.   Hematology Recent Labs Lab 10/07/16 0213 10/11/16 0651  WBC 7.5 9.7  RBC 4.71 4.50  HGB 14.7 13.9  HCT 45.2 42.6  MCV 96.0 94.7  MCH 31.2 30.9  MCHC 32.5 32.6  RDW 15.6* 16.1*  PLT 224 244    Cardiac EnzymesNo results for input(s): TROPONINI in the last 168 hours. No results for input(s): TROPIPOC in the last 168 hours.   BNP Recent Labs Lab 10/07/16 0213 10/12/16 0720  BNP 944.0* 897.0*     DDimer No results for input(s): DDIMER in the last 168 hours.   Radiology    No results found.  Cardiac Studies   Echo 10/07/16- Study Conclusions  - Left ventricle: The cavity size was mildly dilated. Wall thickness was increased in a pattern of mild LVH. Systolic function was severely reduced. The estimated ejection fraction was 20%. Diffuse hypokinesis. Features are consistent with a pseudonormal left ventricular filling pattern, with concomitant abnormal relaxation and increased filling pressure (grade 2 diastolic dysfunction). Doppler parameters are consistent with high ventricular filling pressure. - Regional wall motion abnormality: Akinesis of the mid anterior, mid anteroseptal, basal-mid inferior, and mid anterolateral myocardium; severe hypokinesis of the apical inferior myocardium. - Aortic  valve: Trileaflet; mildly calcified leaflets. - Mitral valve: There was moderate regurgitation. - Right ventricle: Systolic function was moderately to severely reduced. - Tricuspid valve: There was moderate regurgitation. - Pulmonary arteries: PA peak pressure: 34 mm Hg (S). - Inferior vena cava: The vessel was dilated. The respirophasic diameter changes were blunted (<50%), consistent with elevated central venous pressure.  Carotid US 10/10/16- IMPRESSION: Small amount of plaque at the carotid bulb and proximal internal carotid arteries bilaterally. Estimated degree of stenosis in the internal carotid arteries is less than 50% bilaterally.  Patent vertebral arteries with antegrade flow.   Patient Profile     51 y.o. female with a history of HTN, DM, and CAD admitted 10/07/16 with edema and dyspnea. She was found to have new CM with an EF of 20% and bilateral CVA.  Assessment & Plan    Acute combined systolic and diastolic CHF- EF 68% with multiple WMA and grade 2 DD. She has diuresed an additional 3.5 L in last 24+ hours. Still has lower extremity edema. Renal function normal. Will continue IV Lasix today, and perhaps switch to oral diuretics tomorrow. Continue Coreg 3.125 mg BID, with plans for initiating Entresto in outpatient setting. I will plan for outpatient ischemic workup.  Cardiomyopathy- Presumable ischemic with WMA and history of CAD. She denies any anginal symptoms. Continue Coreg 3.125 mg BID, with plans for initiating Entresto in outpatient setting. I will plan for outpatient ischemic workup.  CAD- History of RCA BMS 2004. Myoview low risk and EF 55% May 2017. Plan to repeat stress in outpatient setting given severely reduced LVEF. Continue ASA and statin.  Bilateral CVA- On ASA, Plavix, statin.  Uncontrolled DM- Hgb A1c 9.9 on admission  HTN- Mildly elevated DBP today. Continue Coreg at present dose for now.  Elevated TSH- Pt says she was  on thyroid medication at some point but hasn't taken it lately (? compliance with other medications). Synthroid adjusted by primary service  HLD- LDL 106-on statin  Signed, Kate Sable, MD  10/13/2016, 10:30 AM

## 2016-10-13 NOTE — Progress Notes (Signed)
  Speech Language Pathology Treatment: Dysphagia  Patient Details Name: Monica Carlson MRN: [id number] DOB: 1965-07-21 Today's Date: 10/13/2016 Time: 0349-6116 SLP Time Calculation (min) (ACUTE ONLY): 32 min  Assessment / Plan / Recommendation Clinical Impression  SLP followed up for a meal observation with patient consuming mechanical soft and thin liquid breakfast. Note results from barium swallow evaluation: esophageal dysmotility characterized by disruption of primary esophageal waves with stasis. Pt upright sitting on the edge of the bed and consuming mechanical soft and thin liquids without overt s/sx of aspiration. No complaints of globus sensation this date. Discussed diet advancement options however pt prefers meats to be chopped up. Continue dysphagia 3 (mechanical soft) and thin liquids with medicines whole. Reviewed safe swallow precautions. No further ST needs identified. Pt with plans for outpatient GI follow up to further monitor esophageal dysmotility.    HPI HPI: Pt is a 51 y.o.femalewith medical history of essential hypertension, diabetes mellitus, coronary artery disease, and CHF presented with 1-2week history of shortness of breath, leg edema, and dyspnea on exertion. MRI of brain this date revealed: a small acute infarct in the left parietal cortex, and also a small subacute infarct in the right corona radiata. Chronic left PCA infarct noted.       SLP Plan  All goals met       Recommendations  Diet recommendations: Dysphagia 3 (mechanical soft);Thin liquid Liquids provided via: Cup Medication Administration: Whole meds with liquid Supervision: Patient able to self feed;Intermittent supervision to cue for compensatory strategies Compensations: Slow rate;Small sips/bites Postural Changes and/or Swallow Maneuvers: Seated upright 90 degrees;Upright 30-60 min after meal                Oral Care Recommendations: Oral care BID Follow up Recommendations: None SLP Visit  Diagnosis: Dysphagia, unspecified (R13.10) Plan: All goals met       GO               Monica Chaco MA, CCC-SLP Acute Care Speech Language Pathologist    Levi Aland 10/13/2016, 9:01 AM

## 2016-10-13 NOTE — Progress Notes (Signed)
[place] TEAM 3  Monica Carlson  DIY:[id number] DOB: 1966-01-27 DOA: 10/07/2016 PCP: [facility]    Brief Narrative:  51 y.o.femalewith a history of HTN, DM, CAD, and CHF who presented with a week long history of shortness of breath, leg edema, and dyspnea on exertion. She was admitted to [facility] in late July, and discharged with a fluid pill.  Over the week preceding this admit she noted she was not urinating much, so she began drinkingmore fluid.    In the ED BNP was 944. Chest x-ray showed interstitial prominence with small bilateral pleural effusions. The patient was started on IV lasix.  TTE showed EF 20%. Her hospitalization was complicated by acute ischemic stroke. Neurology was consulted to assist.  Subjective: Resting comfortably in bed.  Tells me she feels much better.  Denies chest pain nausea vomiting or abdominal pain.  Assessment & Plan:  Acute on chronic systolic and diastolic CHF TTE noted EF 20%, diffuse HK, grade 2 DD, significant regional wall motion abnormalities, and ecreased RV function - net negative ~12L since admit - suspect ischemic in nature - Cards to complete coronary eval as outpt - diuretic dosing per Cards who wish to continue IV lasix one additional day - anticipate d/c home 8/24  Filed Weights   10/11/16 0628 10/12/16 0500 10/13/16 0641  Weight: 96.6 kg (212 lb 15.4 oz) 91.7 kg (202 lb 2.6 oz) 88.2 kg (194 lb 7.1 oz)    Acute ischemic stroke LUE & LLE weakness started AM of 8/19 - CT brain noted new small right MCA infarct, acute pontine infarct - Neurology recommends ASA+Plavix x 90 days, then ASA alone - needs 30 day event monitor after discharge - additional workup has included:  -MRI brain:  acute infarct left parietal cortex; subacute right corona radiata infarct -MRA brain:  chronic bilateral distal vertebral stenosis -Carotid Duplex:  negative for hemodynamically significant stenosis -LDL 106 - increased lipitor to 80 mg  daily -A1C 9.9  AKI crt has normalized - cont to hold ACEi for now but consider resumption in outpt f/u - follow w/ ongoing diuresis   DM2 10/07/16 A1c 9.9  -well controlled as inpt - suspect med or diet noncompliance at home - educate   Hyperlipidemia Medical tx adjusted this admit   Dysphagia SLP cleared for a dysphagia 2 with thin liquid diet - esophagram noted esophageal dysmotility - GI to follow pt as outpt - tolerating diet w/o difficulty   Essential hypertension BP well controlled w/ diuresis   Coronary artery disease with hx MI 10/01/2004BMS to mid RCA, EF 69% - continue aspirin and BB  Hypothyroidism TSH 17.57 - increased synthroiod dose to 50 mcg daily during this hospital stay   Hypokalemia Due to diuresis - corrected with supplementation  Hypomagnesemia Corrected with supplementation  DVT prophylaxis: lovenox  Code Status: FULL CODE Family Communication: Spoke with family at bedside Disposition Plan: PT/OT have stated [place] is adequate - approaching medical stability for d/c - Cards wishes to utilize IV lasix one additional night - plan for d/c home 8/24  Consultants:  Cardiology  Neurology GI  Procedures: none  Antimicrobials:  none  Objective: Blood pressure (!) 130/94, pulse 85, temperature 98.5 F (36.9 C), temperature source Oral, resp. rate 20, height 5\' 1"  (1.549 m), weight 88.2 kg (194 lb 7.1 oz), SpO2 95 %.  Intake/Output Summary (Last 24 hours) at 10/13/16 1111 Last data filed at 10/13/16 0900  Gross per 24 hour  Intake  950 ml  Output             3250 ml  Net            -2300 ml   Filed Weights   10/11/16 0628 10/12/16 0500 10/13/16 0641  Weight: 96.6 kg (212 lb 15.4 oz) 91.7 kg (202 lb 2.6 oz) 88.2 kg (194 lb 7.1 oz)    Examination: General: No acute respiratory distress Lungs: CTA B w/o wheeze  Cardiovascular: RRR  Abdomen: Nontender, nondistended, soft, bowel sounds positive, no rebound Extremities: 1+ B LE  edema   CBC:  Recent Labs Lab 10/07/16 0213 10/11/16 0651  WBC 7.5 9.7  NEUTROABS 3.8  --   HGB 14.7 13.9  HCT 45.2 42.6  MCV 96.0 94.7  PLT 224 469   Basic Metabolic Panel:  Recent Labs Lab 10/09/16 0614 10/10/16 0603 10/11/16 0651 10/12/16 0720 10/13/16 0601  NA 135 138 136 138 136  K 3.6 3.5 3.0* 2.9* 4.6  CL 100* 99* 98* 97* 94*  CO2 25 26 29  35* 35*  GLUCOSE 105* 81 98 95 133*  BUN 17 19 17 12 13   CREATININE 1.40* 1.46* 1.24* 0.97 0.96  CALCIUM 8.6* 8.4* 8.1* 8.2* 8.6*  MG 1.7 1.8 1.6* 1.7 1.9   GFR: Estimated Creatinine Clearance: 70 mL/min (by C-G formula based on SCr of 0.96 mg/dL).  Liver Function Tests:  Recent Labs Lab 10/07/16 0213  AST 15  ALT 11*  ALKPHOS 55  BILITOT 1.3*  PROT 6.1*  ALBUMIN 2.9*    HbA1C: Hgb A1c MFr Bld  Date/Time Value Ref Range Status  10/07/2016 08:34 AM 9.9 (H) 4.8 - 5.6 % Final    Comment:    (NOTE) Pre diabetes:          5.7%-6.4% Diabetes:              >6.4% Glycemic control for   <7.0% adults with diabetes   10/23/2015 10:09 AM 12.7 (H) <5.7 % Final    Comment:      For someone without known diabetes, a hemoglobin A1c value of 6.5% or greater indicates that they may have diabetes and this should be confirmed with a follow-up test.   For someone with known diabetes, a value <7% indicates that their diabetes is well controlled and a value greater than or equal to 7% indicates suboptimal control. A1c targets should be individualized based on duration of diabetes, age, comorbid conditions, and other considerations.   Currently, no consensus exists for use of hemoglobin A1c for diagnosis of diabetes for children.       CBG:  Recent Labs Lab 10/12/16 0733 10/12/16 1135 10/12/16 1616 10/12/16 2111 10/13/16 0811  GLUCAP 94 120* 180* 216* 141*    Scheduled Meds: . aspirin EC  325 mg Oral Daily  . atorvastatin  80 mg Oral q1800  . carvedilol  3.125 mg Oral BID WC  . citalopram  20 mg Oral  Daily  . clopidogrel  75 mg Oral Q breakfast  . enoxaparin (LOVENOX) injection  40 mg Subcutaneous Q24H  . fenofibrate  160 mg Oral Daily  . folic acid  1 mg Oral Daily  . furosemide  40 mg Intravenous BID  . gabapentin  300 mg Oral BID  . insulin aspart  0-5 Units Subcutaneous QHS  . insulin aspart  0-9 Units Subcutaneous TID WC  . levothyroxine  50 mcg Oral QAC breakfast  . magnesium oxide  400 mg Oral Daily  . niacin  500 mg Oral Daily  . pantoprazole  40 mg Oral Daily  . potassium chloride  40 mEq Oral TID     LOS: 6 days   Cherene Altes, MD [facility] Office  [phone] Pager - Text Page per Amion as per below:  On-Call/Text Page:      [email or web address]      password [email or web address]  If 7PM-7AM, please contact night-coverage [email or web address] Password [email or web address] 10/13/2016, 11:11 AM

## 2016-10-14 DIAGNOSIS — E118 Type 2 diabetes mellitus with unspecified complications: Secondary | ICD-10-CM

## 2016-10-14 LAB — BASIC METABOLIC PANEL
Anion gap: 7 (ref 5–15)
BUN: 15 mg/dL (ref 6–20)
CALCIUM: 8.7 mg/dL — AB (ref 8.9–10.3)
CO2: 35 mmol/L — ABNORMAL HIGH (ref 22–32)
CREATININE: 1.04 mg/dL — AB (ref 0.44–1.00)
Chloride: 93 mmol/L — ABNORMAL LOW (ref 101–111)
GFR calc Af Amer: >60 mL/min (ref >60)
GLUCOSE: 132 mg/dL — AB (ref 65–99)
POTASSIUM: 4.5 mmol/L (ref 3.5–5.1)
SODIUM: 135 mmol/L (ref 135–145)

## 2016-10-14 LAB — GLUCOSE, CAPILLARY
GLUCOSE-CAPILLARY: 118 mg/dL — AB (ref 65–99)
Glucose-Capillary: 321 mg/dL — ABNORMAL HIGH (ref 65–99)

## 2016-10-14 MED ORDER — CLOPIDOGREL BISULFATE 75 MG PO TABS: 75.0000 mg | ORAL_TABLET | Freq: Every day | ORAL | 0 refills | Status: DC

## 2016-10-14 MED ORDER — FENOFIBRATE 160 MG PO TABS
160.0000 mg | ORAL_TABLET | Freq: Every day | ORAL | 0 refills | Status: DC
Start: 2016-10-15 — End: 2016-11-15

## 2016-10-14 MED ORDER — FUROSEMIDE 40 MG PO TABS
40.0000 mg | ORAL_TABLET | Freq: Two times a day (BID) | ORAL | Status: DC
  Administered 2016-10-14: 40 mg via ORAL
  Filled 2016-10-14: qty 1

## 2016-10-14 MED ORDER — ASPIRIN 325 MG PO TBEC: 325.0000 mg | DELAYED_RELEASE_TABLET | Freq: Every day | ORAL | Status: DC

## 2016-10-14 MED ORDER — LEVOTHYROXINE SODIUM 50 MCG PO TABS: 50.0000 ug | ORAL_TABLET | Freq: Every day | ORAL | 0 refills | Status: DC

## 2016-10-14 MED ORDER — FUROSEMIDE 40 MG PO TABS: 40.0000 mg | ORAL_TABLET | Freq: Two times a day (BID) | ORAL | 0 refills | Status: DC

## 2016-10-14 MED ORDER — ATORVASTATIN CALCIUM 80 MG PO TABS: 80.0000 mg | ORAL_TABLET | Freq: Every day | ORAL | 0 refills | Status: DC

## 2016-10-14 MED ORDER — CARVEDILOL 3.125 MG PO TABS: 3.1250 mg | ORAL_TABLET | Freq: Two times a day (BID) | ORAL | 0 refills | Status: DC

## 2016-10-14 MED ORDER — GABAPENTIN 300 MG PO CAPS: 300.0000 mg | ORAL_CAPSULE | Freq: Two times a day (BID) | ORAL | 0 refills | Status: DC

## 2016-10-14 MED ORDER — PANTOPRAZOLE SODIUM 40 MG PO TBEC: 40.0000 mg | DELAYED_RELEASE_TABLET | Freq: Every day | ORAL | 0 refills | Status: DC

## 2016-10-14 NOTE — Care Management (Addendum)
Patient discharging home today. Home health has been arranged with [facility] for RN and PT. Patient/family interested in gait belt. CM discussed places to buy belt and that insurance will not cover gait belt. RW has been delivered.

## 2016-10-14 NOTE — Progress Notes (Signed)
Progress Note  Patient Name: Monica Carlson Date of Encounter: 10/14/2016  Primary Cardiologist: Dr. Bronson Ing  Subjective   Feeling better with respect to leg swelling. Denies chest pain and shortness of breath. Complains of left foot neuropathic pain, worse yesterday after physical therapy.  Inpatient Medications    Scheduled Meds: . aspirin EC  325 mg Oral Daily  . atorvastatin  80 mg Oral q1800  . carvedilol  3.125 mg Oral BID WC  . citalopram  20 mg Oral Daily  . clopidogrel  75 mg Oral Q breakfast  . enoxaparin (LOVENOX) injection  40 mg Subcutaneous Q24H  . fenofibrate  160 mg Oral Daily  . folic acid  1 mg Oral Daily  . furosemide  40 mg Oral BID  . gabapentin  300 mg Oral BID  . insulin aspart  0-5 Units Subcutaneous QHS  . insulin aspart  0-9 Units Subcutaneous TID WC  . levothyroxine  50 mcg Oral QAC breakfast  . magnesium oxide  400 mg Oral Daily  . niacin  500 mg Oral Daily  . pantoprazole  40 mg Oral Daily  . potassium chloride  40 mEq Oral TID   Continuous Infusions:  PRN Meds: acetaminophen, ondansetron (ZOFRAN) IV   Vital Signs    Vitals:   10/13/16 1500 10/13/16 2109 10/13/16 2220 10/14/16 0521  BP: 110/62  120/88 117/79  Pulse: 81  73 90  Resp: 19  20 14   Temp: 98.2 F (36.8 C)  98.4 F (36.9 C) 98.6 F (37 C)  TempSrc: Oral  Oral Oral  SpO2: 96% 92% 100% 96%  Weight:      Height:        Intake/Output Summary (Last 24 hours) at 10/14/16 0854 Last data filed at 10/14/16 0522  Gross per 24 hour  Intake             1080 ml  Output             4350 ml  Net            -3270 ml   Filed Weights   10/11/16 0628 10/12/16 0500 10/13/16 0641  Weight: 212 lb 15.4 oz (96.6 kg) 202 lb 2.6 oz (91.7 kg) 194 lb 7.1 oz (88.2 kg)    Telemetry    NSR - Personally Reviewed  ECG      Physical Exam   GEN: No acute distress.   Neck: No JVD Cardiac: RRR, no murmurs, rubs, or gallops.  Respiratory: Diminished on 1/3 up, no wheezes or  crackles GI: Soft, nontender, non-distended  MS: 1+ pitting b/l pretibial edema (less than yesterday) Neuro:  Nonfocal  Psych: Normal affect   Labs    Chemistry Recent Labs Lab 10/12/16 0720 10/13/16 0601 10/14/16 0606  NA 138 136 135  K 2.9* 4.6 4.5  CL 97* 94* 93*  CO2 35* 35* 35*  GLUCOSE 95 133* 132*  BUN 12 13 15   CREATININE 0.97 0.96 1.04*  CALCIUM 8.2* 8.6* 8.7*  GFRNONAA >60 >60 >60  GFRAA >60 >60 >60  ANIONGAP 6 7 7      Hematology Recent Labs Lab 10/11/16 0651  WBC 9.7  RBC 4.50  HGB 13.9  HCT 42.6  MCV 94.7  MCH 30.9  MCHC 32.6  RDW 16.1*  PLT 244    Cardiac EnzymesNo results for input(s): TROPONINI in the last 168 hours. No results for input(s): TROPIPOC in the last 168 hours.   BNP Recent Labs Lab 10/12/16 0720  BNP  897.0*     DDimer No results for input(s): DDIMER in the last 168 hours.   Radiology    Dg Chest 2 View  Result Date: 10/13/2016 CLINICAL DATA:  CHF.  Diminished breath sounds on the right. EXAM: CHEST  2 VIEW COMPARISON:  10/07/2016 FINDINGS: Prominent interstitial markings and mild cardiomegaly. Chronic elevation of the right diaphragm. Low volume chest. Stable aortic and hilar contours. Cholecystectomy clips. IMPRESSION: 1. Cardiomegaly and borderline vascular congestion. Probable trace right pleural effusion. 2. Chronic elevation of the right diaphragm. Electronically Signed   By: Monte Fantasia M.D.   On: 10/13/2016 10:57    Cardiac Studies    Echo 10/07/16- Study Conclusions  - Left ventricle: The cavity size was mildly dilated. Wall thickness was increased in a pattern of mild LVH. Systolic function was severely reduced. The estimated ejection fraction was 20%. Diffuse hypokinesis. Features are consistent with a pseudonormal left ventricular filling pattern, with concomitant abnormal relaxation and increased filling pressure (grade 2 diastolic dysfunction). Doppler parameters are consistent  with high ventricular filling pressure. - Regional wall motion abnormality: Akinesis of the mid anterior, mid anteroseptal, basal-mid inferior, and mid anterolateral myocardium; severe hypokinesis of the apical inferior myocardium. - Aortic valve: Trileaflet; mildly calcified leaflets. - Mitral valve: There was moderate regurgitation. - Right ventricle: Systolic function was moderately to severely reduced. - Tricuspid valve: There was moderate regurgitation. - Pulmonary arteries: PA peak pressure: 34 mm Hg (S). - Inferior vena cava: The vessel was dilated. The respirophasic diameter changes were blunted (<50%), consistent with elevated central venous pressure.  Carotid US 10/10/16- IMPRESSION: Small amount of plaque at the carotid bulb and proximal internal carotid arteries bilaterally. Estimated degree of stenosis in the internal carotid arteries is less than 50% bilaterally.  Patent vertebral arteries with antegrade flow.   Patient Profile     51 y.o. female with a history of HTN, DM, and CAD admitted 10/07/16 with edema and dyspnea. She was found to have new CM with an EF of 20% and bilateral CVA.  Assessment & Plan    Acute combined systolic and diastolic CHF- EF 15% with multiple WMA and grade 2 DD. She has diuresed nearly 3.3 L in last 24 hours. Still has lower extremity edema but this has improved. Creatinine up to 1.04. Will discontinue IV Lasix today and switch to oral Lasix 40 mg bid. Continue Coreg 3.125 mg BID. I plan to initiate Entresto in outpatient setting. I will plan for outpatient ischemic workup.  Cardiomyopathy- Presumable ischemic with WMA and history of CAD. She denies any anginal symptoms. Continue Coreg 3.125 mg BID, with plans for initiating Entresto in outpatient setting. I will plan for outpatient ischemic workup.  CAD- Symptomatically stable. History of RCA BMS 2004. Myoview low risk and EF 55% May 2017. Plan to repeat stress in  outpatient setting given severely reduced LVEF. Continue ASA and statin.  Bilateral CVA- On ASA, Plavix, statin.  Uncontrolled DM- Hgb A1c 9.9 on admission  HTN- Controlled today. Continue Coreg at present dose.  Elevated TSH- Pt says she was on thyroid medication at some point but hasn't taken it lately (? compliance with other medications). Synthroid adjusted by primary service  HLD- LDL 106-on statin   Disposition: Likely ready for discharge. I will arrange for early outpatient follow up.   Signed, Kate Sable, MD  10/14/2016, 8:54 AM

## 2016-10-14 NOTE — Progress Notes (Signed)
All D/C instructions were reviewed with Patient.  Prescriptions were given to patient as well as all f/u appts.  All questions and concerns were addressed with patient.  D/C'd in stable condition.

## 2016-10-14 NOTE — Discharge Instructions (Signed)
Heart Failure °Heart failure means your heart has trouble pumping blood. This makes it hard for your body to work well. Heart failure is usually a long-term (chronic) condition. You must take good care of yourself and follow your doctor's treatment plan. °Follow these instructions at home: °· Take your heart medicine as told by your doctor. °? Do not stop taking medicine unless your doctor tells you to. °? Do not skip any dose of medicine. °? Refill your medicines before they run out. °? Take other medicines only as told by your doctor or pharmacist. °· Stay active if told by your doctor. The elderly and people with severe heart failure should talk with a doctor about physical activity. °· Eat heart-healthy foods. Choose foods that are without trans fat and are low in saturated fat, cholesterol, and salt (sodium). This includes fresh or frozen fruits and vegetables, fish, lean meats, fat-free or low-fat dairy foods, whole grains, and high-fiber foods. Lentils and dried peas and beans (legumes) are also good choices. °· Limit salt if told by your doctor. °· Cook in a healthy way. Roast, grill, broil, bake, poach, steam, or stir-fry foods. °· Limit fluids as told by your doctor. °· Weigh yourself every morning. Do this after you pee (urinate) and before you eat breakfast. Write down your weight to give to your doctor. °· Take your blood pressure and write it down if your doctor tells you to. °· Ask your doctor how to check your pulse. Check your pulse as told. °· Lose weight if told by your doctor. °· Stop smoking or chewing tobacco. Do not use gum or patches that help you quit without your doctor's approval. °· Schedule and go to doctor visits as told. °· Nonpregnant women should have no more than 1 drink a day. Men should have no more than 2 drinks a day. Talk to your doctor about drinking alcohol. °· Stop illegal drug use. °· Stay current with shots (immunizations). °· Manage your health conditions as told by your  doctor. °· Learn to manage your stress. °· Rest when you are tired. °· If it is really hot outside: °? Avoid intense activities. °? Use air conditioning or fans, or get in a cooler place. °? Avoid caffeine and alcohol. °? Wear loose-fitting, lightweight, and light-colored clothing. °· If it is really cold outside: °? Avoid intense activities. °? Layer your clothing. °? Wear mittens or gloves, a hat, and a scarf when going outside. °? Avoid alcohol. °· Learn about heart failure and get support as needed. °· Get help to maintain or improve your quality of life and your ability to care for yourself as needed. °Contact a doctor if: °· You gain weight quickly. °· You are more short of breath than usual. °· You cannot do your normal activities. °· You tire easily. °· You cough more than normal, especially with activity. °· You have any or more puffiness (swelling) in areas such as your hands, feet, ankles, or belly (abdomen). °· You cannot sleep because it is hard to breathe. °· You feel like your heart is beating fast (palpitations). °· You get dizzy or light-headed when you stand up. °Get help right away if: °· You have trouble breathing. °· There is a change in mental status, such as becoming less alert or not being able to focus. °· You have chest pain or discomfort. °· You faint. °This information is not intended to replace advice given to you by your health care provider. Make sure you   discuss any questions you have with your health care provider. Document Released: 11/17/2007 Document Revised: 07/16/2015 Document Reviewed: 03/26/2012 Elsevier Interactive Patient Education  2017 [employer].   Ischemic Stroke An ischemic stroke is the sudden death of brain tissue. Blood carries oxygen to all areas of the body. This type of stroke happens when your blood does not flow to your brain like normal. Your brain cannot get the oxygen it needs. This is an emergency. It must be treated right away. Symptoms of a  stroke usually happen all of a sudden. You may notice them when you wake up. They can include:  Weakness or loss of feeling in your face, arm, or leg. This often happens on one side of the body.  Trouble walking.  Trouble moving your arms or legs.  Loss of balance or coordination.  Feeling confused.  Trouble talking or understanding what people are saying.  Slurred speech.  Trouble seeing.  Seeing two of one object (double vision).  Feeling dizzy.  Feeling sick to your stomach (nauseous) and throwing up (vomiting).  A very bad headache for no reason.  Get help as soon as any of these problems start. This is important. Some treatments work better if they are given right away. These include:  Aspirin.  Medicines to control blood pressure.  A shot (injection) of medicine to break up the blood clot.  Treatments given in the blood vessel (artery) to take out the clot or break it up.  Other treatments may include:  Oxygen.  Fluids given through an IV tube.  Medicines to thin out your blood.  Procedures to help your blood flow better.  What increases the risk? Certain things may make you more likely to have a stroke. Some of these are things that you can change, such as:  Being very overweight (obesity).  Smoking.  Taking birth control pills.  Not being active.  Drinking too much alcohol.  Using drugs.  Other risk factors include:  High blood pressure.  High cholesterol.  Diabetes.  Heart disease.  Being [place] American, Native American, Hispanic, or [place] Native.  Being over age 74.  Family history of stroke.  Having had blood clots, stroke, or warning stroke (transient ischemic attack, TIA) in the past.  Sickle cell disease.  Being a woman with a history of high blood pressure in pregnancy (preeclampsia).  Migraine headache.  Sleep apnea.  Having an irregular heartbeat (atrial fibrillation).  Long-term (chronic) diseases that cause  soreness and swelling (inflammation).  Disorders that affect how your blood clots.  Follow these instructions at home: Medicines  Take over-the-counter and prescription medicines only as told by your doctor.  If you were told to take aspirin or another medicine to thin your blood, take it exactly as told by your doctor. ? Taking too much of the medicine can cause bleeding. ? If you do not take enough, it may not work as well.  Know the side effects of your medicines. If you are taking a blood thinner, make sure you: ? Hold pressure over any cuts for longer than usual. ? Tell your dentist and other doctors that you take this medicine. ? Avoid activities that may cause damage or injury to your body. Eating and drinking  Follow instructions from your doctor about what you cannot eat or drink.  Eat healthy foods.  If you have trouble with swallowing, do these things to avoid choking: ? Take small bites when eating. ? Eat foods that are soft or  pureed. Safety  Follow instructions from your health care team about physical activity.  Use a walker or cane as told by your doctor.  Keep your home safe so you do not fall. This may include: ? Having experts look at your home to make sure it is safe. ? Putting grab bars in the bedroom and bathroom. ? Using raised toilets. ? Putting a seat in the shower. General instructions  Do not use any tobacco products. ? Examples of these are cigarettes, chewing tobacco, and e-cigarettes. ? If you need help quitting, ask your doctor.  Limit how much alcohol you drink. This means no more than 1 drink a day for nonpregnant women and 2 drinks a day for men. One drink equals 12 oz of beer, 5 oz of wine, or 1 oz of hard liquor.  If you need help to stop using drugs or alcohol, ask your doctor to refer you to a program or specialist.  Stay active. Exercise as told by your doctor.  Keep all follow-up visits as told by your doctor. This is  important. Get help right away if:  You suddenly: ? Have weakness or loss of feeling in your face, arm, or leg. ? Feel confused. ? Have trouble talking or understanding what people are saying. ? Have trouble seeing. ? Have trouble walking. ? Have trouble moving your arms or legs. ? Feel dizzy. ? Lose your balance or coordination. ? Have a very bad headache and you do not know why.  You pass out (lose consciousness) or almost pass out.  You have jerky movements that you cannot control (seizure). These symptoms may be an emergency. Do not wait to see if the symptoms will go away. Get medical help right away. Call your local emergency services (911 in the [place] Do not drive yourself to the hospital. This information is not intended to replace advice given to you by your health care provider. Make sure you discuss any questions you have with your health care provider. Document Released: 01/27/2011 Document Revised: 07/21/2015 Document Reviewed: 05/06/2015 Elsevier Interactive Patient Education  [employer].

## 2016-10-14 NOTE — Discharge Summary (Signed)
DISCHARGE SUMMARY  Monica Carlson  MR#: [id number]  DOB:September 19, 1965  Date of Admission: 10/07/2016 Date of Discharge: 10/14/2016  Attending Physician:Inmer Nix T  Patient's NWG:NFAOZH, [facility]  Consults: Cardiology  Neurology GI  Disposition: D/C home   Follow-up Appts: Follow-up Information    Timmothy Euler, MD Follow up on 10/17/2016.   Specialty:  Family Medicine Why:  at 8:25 am Contact information: [place] [address] [phone]        Lendon Colonel, NP Follow up on 11/01/2016.   Specialties:  Nurse Practitioner, Radiology, Cardiology Why:  1:30 pm Contact information: [place] [address] [phone]        Daneil Dolin, MD. Schedule an appointment as soon as possible for a visit in 2 week(s).   Specialty:  Gastroenterology Contact information: [address] [place] [address] [phone]           Tests Needing Follow-up: -after 90 days of ASA + Plavix tx the pt can be transitioned to ASA alone (started 10/11/16) -30 day event monitor to be arranged in f/u w/ Cardiology  -ACEi on hold due to AKI - consider resuming in outpt setting if Entresto not started by Cards and if crt stable  -GI to f/u w/ pt to determine if further w/o for possible esophageal dysmotility required -recheck TSH in 8-12 weeks - synthroid dose increased during this admit    Discharge Diagnoses: Acute on chronic systolic and diastolic CHF Acute ischemic stroke Acute kidney injury  DM2 Hyperlipidemia Dysphagia Essential hypertension Coronary artery disease with hx MI Hypothyroidism Hypokalemia Hypomagnesemia  Initial presentation: 51 y.o.femalewith a history of HTN, DM, CAD, and CHF who presented with a week long history of shortness of breath, leg edema, and dyspnea on exertion. She was admitted to [facility] in late July, and discharged with "a fluid pill."  Over the week preceding this admit she  noted she was not urinating much, so she began drinkingmore fluid.    In the ED BNP was 944. Chest x-ray showed interstitial prominence with small bilateral pleural effusions. The patient was started on IV lasix.  TTE showed EF 20%.   Her hospitalization was complicated by an acute ischemic stroke. Neurology was consulted to [facility] Course:  Acute on chronic systolic and diastolic CHF TTE noted EF 20%, diffuse HK, grade 2 DD, significant regional wall motion abnormalities, and decreased RV function - net negative ~15L since admit - suspect ischemic in nature - Cards to complete coronary eval as outpt - diuretic dosing per Cards Filed Weights   10/11/16 0628 10/12/16 0500 10/13/16 0641  Weight: 96.6 kg (212 lb 15.4 oz) 91.7 kg (202 lb 2.6 oz) 88.2 kg (194 lb 7.1 oz)    Acute ischemic stroke LUE &LLE weakness started AM of 8/19 - CT brain noted new small right MCA infarct, acute pontine infarct - Neurology recommended ASA+Plavix x 90 days, then ASA alone - needs 30 day event monitor after discharge - additional workup has included:  -MRI brain:  acute infarct left parietal cortex; subacute right corona radiata infarct -MRA brain:  chronic bilateral distal vertebral stenosis -Carotid Duplex:  negative for hemodynamically significant stenosis -LDL 106 - increasedlipitor to 80 mg daily -A1C 9.9  AKI crt has normalized - cont to hold ACEi for now but consider resumption in outpt f/u - follow w/ ongoing diuresis   DM2 10/07/16 A1c 9.9  -well controlled as inpt - suspect med or diet noncompliance  at home - educated  Hyperlipidemia Medical tx adjusted this admit   Dysphagia SLP cleared for a dysphagia 2 with thin liquid diet - esophagram noted esophageal dysmotility - GI to follow pt as outpt - tolerating diet w/o difficulty   Essential hypertension BP well controlled w/ diuresis   Coronary artery disease with hx MI 10/01/2004BMS to mid RCA, EF 69% - continue  aspirin and BB  Hypothyroidism TSH 17.57 - increased synthroiod dose to 50 mcg daily during this hospital stay   Hypokalemia Due to diuresis - corrected with supplementation  Hypomagnesemia Corrected with supplementation  Allergies as of 10/14/2016   No Known Allergies     Medication List    STOP taking these medications   lisinopril 40 MG tablet Commonly known as:  PRINIVIL,ZESTRIL   metoprolol tartrate 50 MG tablet Commonly known as:  LOPRESSOR     TAKE these medications   aspirin 325 MG EC tablet Take 1 tablet (325 mg total) by mouth daily.   atorvastatin 80 MG tablet Commonly known as:  LIPITOR Take 1 tablet (80 mg total) by mouth daily at 6 PM. What changed:  medication strength  See the new instructions.  Another medication with the same name was removed. Continue taking this medication, and follow the directions you see here.   carvedilol 3.125 MG tablet Commonly known as:  COREG Take 1 tablet (3.125 mg total) by mouth 2 (two) times daily with a meal.   citalopram 20 MG tablet Commonly known as:  CELEXA TAKE ONE TABLET BY MOUTH ONCE DAILY   clopidogrel 75 MG tablet Commonly known as:  PLAVIX Take 1 tablet (75 mg total) by mouth daily with breakfast.   fenofibrate 160 MG tablet Take 1 tablet (160 mg total) by mouth daily. What changed:  medication strength  how much to take   furosemide 40 MG tablet Commonly known as:  LASIX Take 1 tablet (40 mg total) by mouth 2 (two) times daily.   gabapentin 300 MG capsule Commonly known as:  NEURONTIN Take 1 capsule (300 mg total) by mouth 2 (two) times daily.   glimepiride 4 MG tablet Commonly known as:  AMARYL Take 1 tablet (4 mg total) by mouth daily before breakfast.   levothyroxine 50 MCG tablet Commonly known as:  SYNTHROID, LEVOTHROID Take 1 tablet (50 mcg total) by mouth daily before breakfast. What changed:  medication strength  See the new instructions.   metFORMIN 1000 MG  tablet Commonly known as:  GLUCOPHAGE TAKE ONE TABLET BY MOUTH TWICE DAILY   niacin 500 MG tablet Take 500 mg by mouth daily.   nitroGLYCERIN 0.4 MG SL tablet Commonly known as:  NITROSTAT Place 1 tablet (0.4 mg total) under the tongue every 5 (five) minutes as needed for chest pain.   pantoprazole 40 MG tablet Commonly known as:  PROTONIX Take 1 tablet (40 mg total) by mouth daily.            Durable Medical Equipment        Start     Ordered   10/11/16 1423  For home use only DME Walker rolling  Once    Question:  Patient needs a walker to treat with the following condition  Answer:  CVA (cerebral vascular accident) ([place])   10/11/16 1422       Discharge Care Instructions        Start     Ordered   10/15/16 0000  aspirin EC 325 MG EC tablet  Daily  10/14/16 1053   10/15/16 0000  clopidogrel (PLAVIX) 75 MG tablet  Daily with breakfast     10/14/16 1053   10/15/16 0000  fenofibrate 160 MG tablet  Daily     10/14/16 1053   10/15/16 0000  pantoprazole (PROTONIX) 40 MG tablet  Daily     10/14/16 1053   10/15/16 0000  levothyroxine (SYNTHROID, LEVOTHROID) 50 MCG tablet  Daily before breakfast     10/14/16 1053   10/14/16 0000  atorvastatin (LIPITOR) 80 MG tablet  Daily-1800     10/14/16 1053   10/14/16 0000  carvedilol (COREG) 3.125 MG tablet  2 times daily with meals     10/14/16 1053   10/14/16 0000  furosemide (LASIX) 40 MG tablet  2 times daily     10/14/16 1053   10/14/16 0000  gabapentin (NEURONTIN) 300 MG capsule  2 times daily     10/14/16 1053   10/14/16 0000  Increase activity slowly     10/14/16 1053   10/14/16 0000  Diet - low sodium heart healthy     10/14/16 1053   10/14/16 0000  Diet Carb Modified     10/14/16 1053      Day of Discharge BP 117/79 (BP Location: Right Arm)   Pulse 90   Temp 98.6 F (37 C) (Oral)   Resp 14   Ht 5\' 1"  (1.549 m)   Wt 88.2 kg (194 lb 7.1 oz)   SpO2 96%   BMI 36.74 kg/m   Physical Exam: General: No  acute respiratory distress Lungs: Clear to auscultation bilaterally without wheezes or crackles Cardiovascular: Regular rate and rhythm without murmur gallop or rub normal S1 and S2 Abdomen: Nontender, nondistended, soft, bowel sounds positive, no rebound, no ascites, no appreciable mass Extremities: 1+ B LE edema   Basic Metabolic Panel:  Recent Labs Lab 10/09/16 0614 10/10/16 0603 10/11/16 0651 10/12/16 0720 10/13/16 0601 10/14/16 0606  NA 135 138 136 138 136 135  K 3.6 3.5 3.0* 2.9* 4.6 4.5  CL 100* 99* 98* 97* 94* 93*  CO2 25 26 29  35* 35* 35*  GLUCOSE 105* 81 98 95 133* 132*  BUN 17 19 17 12 13 15   CREATININE 1.40* 1.46* 1.24* 0.97 0.96 1.04*  CALCIUM 8.6* 8.4* 8.1* 8.2* 8.6* 8.7*  MG 1.7 1.8 1.6* 1.7 1.9  --     CBC:  Recent Labs Lab 10/11/16 0651  WBC 9.7  HGB 13.9  HCT 42.6  MCV 94.7  PLT 244   BNP (last 3 results)  Recent Labs  10/07/16 0213 10/12/16 0720  BNP 944.0* 897.0*    CBG:  Recent Labs Lab 10/13/16 0811 10/13/16 1156 10/13/16 1642 10/13/16 2049 10/14/16 0804  GLUCAP 141* 154* 168* 225* 118*    Time spent in discharge (includes decision making & examination of pt): 35 minutes  10/14/2016, 10:55 AM   Cherene Altes, MD [facility] Office  [phone] Pager [phone]  On-Call/Text Page:      [email or web address]      password [facility]

## 2016-10-14 NOTE — Progress Notes (Signed)
Physical Therapy Treatment Patient Details Name: Monica Carlson MRN: [id number] DOB: 01-14-1966 Today's Date: 10/14/2016    History of Present Illness Monica Carlson is a 51 y.o. female with medical history of essential hypertension, diabetes mellitus, coronary artery disease, and CHF presented with 1-2 week history of shortness of breath, leg edema, and dyspnea on exertion. The patient states that she was admitted to [facility] the third week of July. She states that she was discharged with a fluid pill with which she has been compliant. However, in the past week she states that she has not been urinating as much. Therefore, she has been checking more fluid in hopes to make herself urinate more. She has had difficulty laying flat. For the past month, she has had to sleep sitting up in a recliner. She denies any fevers, chills, headache, neck pain, chest pain, nausea, vomiting, diarrhea, dysuria. She states that she does not have a scale to weigh herself.    PT Comments    Patient and daughter instructed in stair training in stair well with good return demonstrated for going up/down 9 steps x 2 trials with contact guard assist with patient's daughter assisting, no loss of balance and understanding acknowledged.  Patient c/o slight weakness in LLE while completing exercises, c/o 10/10 pain top of left foot, no facial grimacing of pain while completing exercises and functional activity during treatment.  Patient Supervised for gait with FWW x 100 feet and tolerated sitting up at bedside with family supervising after therapy.     Follow Up Recommendations  Supervision - Intermittent     Equipment Recommendations  Rolling walker with 5" wheels    Recommendations for Other Services       Precautions / Restrictions Precautions Precautions: Fall Restrictions Weight Bearing Restrictions: No    Mobility  Bed Mobility Overal bed mobility: Modified Independent Bed Mobility: Supine to Sit;Sit to Supine      Supine to sit: Modified independent (Device/Increase time) Sit to supine: Modified independent (Device/Increase time)      Transfers Overall transfer level: Needs assistance Equipment used: Rolling walker (2 wheeled) Transfers: Sit to/from Stand Sit to Stand: Supervision Stand pivot transfers: Supervision          Ambulation/Gait Ambulation/Gait assistance: Supervision Ambulation Distance (Feet): 100 Feet Assistive device: Rolling walker (2 wheeled) Gait Pattern/deviations: Decreased step length - left;Decreased stride length   Gait velocity interpretation: Below normal speed for age/gender General Gait Details: Demonstrates slighlty labored movement, mild weakness noted in LLE when going up/down steps   Stairs Stairs: Yes   Stair Management: One rail Left Number of Stairs: 9 General stair comments: Requires occasional verbal cues for proper foot placement when going up stairs (led with stronger leg)  Wheelchair Mobility    Modified Rankin (Stroke Patients Only)       Balance Overall balance assessment: Needs assistance Sitting-balance support: No upper extremity supported Sitting balance-Leahy Scale: Good     Standing balance support: Bilateral upper extremity supported Standing balance-Leahy Scale: Fair                              Cognition Arousal/Alertness: Awake/alert Behavior During Therapy: WFL for tasks assessed/performed Overall Cognitive Status: Within Functional Limits for tasks assessed  Exercises General Exercises - Lower Extremity Ankle Circles/Pumps: Both;10 reps Long Arc Quad: Both;10 reps Hip Flexion/Marching: Both;10 reps    General Comments        Pertinent Vitals/Pain Pain Assessment: 0-10 Pain Score: 10-Worst pain ever Pain Location: on top of left foot Pain Descriptors / Indicators: Constant;Dull Pain Intervention(s): Monitored during session;Limited  activity within patient's tolerance    Home Living                      Prior Function            PT Goals (current goals can now be found in the care plan section) Acute Rehab PT Goals Patient Stated Goal: Return home with family to assist PT Goal Formulation: With patient/family Time For Goal Achievement: 10/21/16 Potential to Achieve Goals: Good Progress towards PT goals: Progressing toward goals    Frequency    Min 3X/week      PT Plan Current plan remains appropriate    Co-evaluation              AM-PAC PT "6 Clicks" Daily Activity  Outcome Measure  Difficulty turning over in bed (including adjusting bedclothes, sheets and blankets)?: None Difficulty moving from lying on back to sitting on the side of the bed? : None Difficulty sitting down on and standing up from a chair with arms (e.g., wheelchair, bedside commode, etc,.)?: None Help needed moving to and from a bed to chair (including a wheelchair)?: None Help needed walking in hospital room?: A Little Help needed climbing 3-5 steps with a railing? : A Little 6 Click Score: 22    End of Session Equipment Utilized During Treatment: Gait belt Activity Tolerance: Patient tolerated treatment well Patient left: in bed;with family/visitor present (left sitting up at bedside) Nurse Communication: Mobility status PT Visit Diagnosis: Unsteadiness on feet (R26.81);Other abnormalities of gait and mobility (R26.89);Muscle weakness (generalized) (M62.81)     Time: 6433-2951 PT Time Calculation (min) (ACUTE ONLY): 38 min  Charges:  $Gait Training: 23-37 mins $Therapeutic Exercise: 8-22 mins                    G Codes:       11:26 AM, 2016/10/27 Lonell Grandchild, MPT Physical Therapist with [facility] [phone] office [phone] mobile phone

## 2016-10-17 ENCOUNTER — Ambulatory Visit: Payer: Medicaid Other | Admitting: Family Medicine

## 2016-10-21 ENCOUNTER — Ambulatory Visit: Payer: Medicaid Other | Admitting: Family Medicine

## 2016-10-21 ENCOUNTER — Telehealth: Payer: Self-pay

## 2016-10-21 NOTE — Telephone Encounter (Signed)
Attempted to contact patient to reschedule New Patient appt

## 2016-10-28 NOTE — Telephone Encounter (Signed)
Rescheduled

## 2016-11-01 ENCOUNTER — Ambulatory Visit: Payer: Medicaid Other | Admitting: Adult Health

## 2016-11-01 ENCOUNTER — Encounter: Payer: Self-pay | Admitting: Adult Health

## 2016-11-01 NOTE — Progress Notes (Deleted)
Cardiology Office Note   Date:  11/01/2016   ID:  Monica Carlson, DOB 04-Dec-1965, MRN [id number]  PCP:  [facility] [place]  Cardiologist: Bronson Ing No chief complaint on file.     History of Present Illness: Monica Carlson is a 51 y.o. female who presents for ongoing assessment and management of CAD, stent placement in 0947 chronic systolic CHF, with history of hypertension, hypercholesterolemia, myocardial infarction 2004 and a history of right MCA infarct CVA. The patient was recently hospitalized in August 2018 the patient was advised that after 90 days of Plavix and aspirin and Plavix treatment can be transitioned to aspirin alone, I 30 day event monitor was placed, the patient was diuresed 15 L.    Past Medical History:  Diagnosis Date  . Cervical cancer ([place])   . CHF (congestive heart failure) ([place]) 07-2014  . Diabetes mellitus without complication ([place]) 0962  . High cholesterol   . Hypertension   . MI (myocardial infarction) ([place])    2004  . Stroke [facility]) 09/2014, 2013    Past Surgical History:  Procedure Laterality Date  . CHOLECYSTECTOMY  1992  . CORONARY STENT PLACEMENT  2004  . TUBAL LIGATION       Current Outpatient Prescriptions  Medication Sig Dispense Refill  . aspirin EC 325 MG EC tablet Take 1 tablet (325 mg total) by mouth daily.    Marland Kitchen atorvastatin (LIPITOR) 80 MG tablet Take 1 tablet (80 mg total) by mouth daily at 6 PM. 30 tablet 0  . carvedilol (COREG) 3.125 MG tablet Take 1 tablet (3.125 mg total) by mouth 2 (two) times daily with a meal. 60 tablet 0  . citalopram (CELEXA) 20 MG tablet TAKE ONE TABLET BY MOUTH ONCE DAILY (Patient not taking: Reported on 10/07/2016) 7 tablet 0  . clopidogrel (PLAVIX) 75 MG tablet Take 1 tablet (75 mg total) by mouth daily with breakfast. 30 tablet 0  . fenofibrate 160 MG tablet Take 1 tablet (160 mg total) by mouth daily. 30 tablet 0  . furosemide (LASIX) 40 MG tablet Take 1 tablet (40 mg total) by mouth 2 (two)  times daily. 60 tablet 0  . gabapentin (NEURONTIN) 300 MG capsule Take 1 capsule (300 mg total) by mouth 2 (two) times daily. 60 capsule 0  . glimepiride (AMARYL) 4 MG tablet Take 1 tablet (4 mg total) by mouth daily before breakfast. 60 tablet 2  . levothyroxine (SYNTHROID, LEVOTHROID) 50 MCG tablet Take 1 tablet (50 mcg total) by mouth daily before breakfast. 30 tablet 0  . metFORMIN (GLUCOPHAGE) 1000 MG tablet TAKE ONE TABLET BY MOUTH TWICE DAILY 14 tablet 0  . niacin 500 MG tablet Take 500 mg by mouth daily.    . nitroGLYCERIN (NITROSTAT) 0.4 MG SL tablet Place 1 tablet (0.4 mg total) under the tongue every 5 (five) minutes as needed for chest pain. 30 tablet 4  . pantoprazole (PROTONIX) 40 MG tablet Take 1 tablet (40 mg total) by mouth daily. 30 tablet 0   No current facility-administered medications for this visit.     Allergies:   Patient has no known allergies.    Social History:  The patient  reports that she has never smoked. She has never used smokeless tobacco. She reports that she does not drink alcohol or use drugs.   Family History:  The patient's family history includes Diabetes in her father; Heart attack in her maternal grandfather; Heart disease in her father; Hypertension in her father; Migraines in her maternal grandfather  and mother.    ROS: All other systems are reviewed and negative. Unless otherwise mentioned in H&P    PHYSICAL EXAM: VS:  There were no vitals taken for this visit. , BMI There is no height or weight on file to calculate BMI. GEN: Well nourished, well developed, in no acute distress  HEENT: normal  Neck: no JVD, carotid bruits, or masses Cardiac: ***RRR; no murmurs, rubs, or gallops,no edema  Respiratory:  clear to auscultation bilaterally, normal work of breathing GI: soft, nontender, nondistended, + BS MS: no deformity or atrophy  Skin: warm and dry, no rash Neuro:  Strength and sensation are intact Psych: euthymic mood, full  affect   EKG:  EKG {ACTION; IS/IS [id number]} ordered today. The ekg ordered today demonstrates ***   Recent Labs: 10/07/2016: ALT 11; TSH 17.574 10/11/2016: Hemoglobin 13.9; Platelets 244 10/12/2016: B Natriuretic Peptide 897.0 10/13/2016: Magnesium 1.9 10/14/2016: BUN 15; Creatinine, Ser 1.04; Potassium 4.5; Sodium 135    Lipid Panel    Component Value Date/Time   CHOL 147 10/08/2016 0628   TRIG 100 10/08/2016 0628   HDL 21 (L) 10/08/2016 0628   CHOLHDL 7.0 10/08/2016 0628   VLDL 20 10/08/2016 0628   LDLCALC 106 (H) 10/08/2016 0628      Wt Readings from Last 3 Encounters:  10/14/16 194 lb 0.1 oz (88 kg)  12/24/15 184 lb (83.5 kg)  09/16/15 188 lb (85.3 kg)      Other studies Reviewed: Additional studies/ records that were reviewed today include: ***. Review of the above records demonstrates: ***   ASSESSMENT AND PLAN:  1.  ***   Current medicines are reviewed at length with the patient today.    Labs/ tests ordered today include: *** Phill Myron. [place] Pugh, ANP, AACC   11/01/2016 7:12 AM    [place]  [address] [place], [place] [address] Phone: [phone]; Fax: [phone]

## 2016-11-11 ENCOUNTER — Ambulatory Visit: Payer: Self-pay | Admitting: Family Medicine

## 2016-11-14 ENCOUNTER — Ambulatory Visit: Payer: Medicaid Other | Admitting: Family Medicine

## 2016-11-14 ENCOUNTER — Encounter: Payer: Self-pay | Admitting: General Practice

## 2016-11-14 NOTE — Progress Notes (Deleted)
Subjective: CC: Hospitalization follow up, establish care HPI: Aliayah Tyer is a 51 y.o. female presenting to clinic today for:  Miss Inabinet was hospitalized 10/14/2016 for acute on chronic heart failure. She was diuresed over 15 L. Her echocardiogram showed an EF of 20%. She is to follow up with cardiology for 30 day event monitor and further advice regarding diuresis. She was supposed to follow up on 911 with cardiology. She reports that***. Additionally, she had an acute CVA in the left parietal cortex, with a subacute CVA noted in the right corona radiata. She is discharged on both aspirin and Plavix. She is supposed to continue the Plavix until 01/11/2017. Thereafter, she is to continue aspirin 81 mg daily only per neurology's recommendations. She was noted to have dysphagia during hospitalization, which she is evaluated for by speech therapy. She is placed on a dysphagia 2 diet and recommended to follow up with gastroenterology for further evaluation. She notes that ***.  She reports that she has follow-up with neurology ***.   She was also noted to have a substantially elevated TSH. Recommendations were to recheck this in October.  ***  Past Medical History:  Diagnosis Date  . Cervical cancer ([place])   . CHF (congestive heart failure) ([place]) 07-2014  . Diabetes mellitus without complication ([place]) 3235  . High cholesterol   . Hypertension   . MI (myocardial infarction) ([place])    2004  . Stroke [facility]) 09/2014, 2013   Past Surgical History:  Procedure Laterality Date  . CHOLECYSTECTOMY  1992  . CORONARY STENT PLACEMENT  2004  . TUBAL LIGATION     Social History   Social History  . Marital status: Married    Spouse name: N/A  . Number of children: N/A  . Years of education: N/A   Occupational History  . Not on file.   Social History Main Topics  . Smoking status: Never Smoker  . Smokeless tobacco: Never Used  . Alcohol use No  . Drug use: No  . Sexual activity: Yes    Birth  control/ protection: None, Surgical   Other Topics Concern  . Not on file   Social History Narrative  . No narrative on file   No outpatient prescriptions have been marked as taking for the 11/14/16 encounter (Appointment) with Janora Norlander, DO.   Family History  Problem Relation Age of Onset  . Migraines Mother   . Heart disease Father   . Hypertension Father   . Diabetes Father   . Migraines Maternal Grandfather   . Heart attack Maternal Grandfather   . Colon cancer Neg Hx   . Colon polyps Neg Hx    No Known Allergies   Health Maintenance: ***  Flu Vaccine: {YES/NO/WILD TDDUK:02542}  Tdap Vaccine: {YES/NO/WILD HCWCB:76283}  - every 73yrs - (<3 lifetime doses or unknown): all wounds -- look up need for Tetanus IG - (>=3 lifetime doses): clean/minor wound if >13yrs from previous; all other wounds if >40yrs from previous Zoster Vaccine: {YES/NO/WILD CARDS:18581} (those >50yo, once) Pneumonia Vaccine: {YES/NO/WILD TDVVO:16073} (those w/ risk factors) - (<52yr) Both: Immunocompromised, cochlear implant, CSF leak, asplenic, sickle cell, Chronic Renal Failure - (<10yr) PPSV-23 only: Heart dz, lung disease, DM, tobacco abuse, alcoholism, cirrhosis/liver disease. - (>36yr): PPSV13 then PPSV23 in 6-12mths;  - (>58yr): repeat PPSV23 once if pt received prior to 52yo and 56yrs have passed  ROS: Per HPI  Objective: Office vital signs reviewed. There were no vitals taken for this visit.  Physical  Examination:  General: Awake, alert, *** nourished, No acute distress HEENT: Normal    Neck: No masses palpated. No lymphadenopathy    Ears: Tympanic membranes intact, normal light reflex, no erythema, no bulging    Eyes: PERRLA, extraocular movement in tact, sclera ***    Nose: nasal turbinates moist, *** nasal discharge    Throat: moist mucus membranes, no erythema, *** tonsillar exudate.  Airway is patent Cardio: regular rate and rhythm, S1S2 heard, no murmurs  appreciated Pulm: clear to auscultation bilaterally, no wheezes, rhonchi or rales; normal work of breathing on room air GI: soft, non-tender, non-distended, bowel sounds present x4, no hepatomegaly, no splenomegaly, no masses GU: external vaginal tissue ***, cervix ***, *** punctate lesions on cervix appreciated, *** discharge from cervical os, *** bleeding, *** cervical motion tenderness, *** abdominal/ adnexal masses Extremities: warm, well perfused, No edema, cyanosis or clubbing; +*** pulses bilaterally MSK: *** gait and *** station Skin: dry; intact; no rashes or lesions Neuro: *** Strength and light touch sensation grossly intact, *** DTRs ***/4  Assessment/ Plan: 51 y.o. female   No problem-specific Assessment & Plan notes found for this encounter.   Janora Norlander, DO [place] [phone]

## 2016-11-15 ENCOUNTER — Ambulatory Visit (INDEPENDENT_AMBULATORY_CARE_PROVIDER_SITE_OTHER): Payer: Medicaid Other | Admitting: Family Medicine

## 2016-11-15 ENCOUNTER — Encounter: Payer: Self-pay | Admitting: Family Medicine

## 2016-11-15 VITALS — BP 142/100 | HR 87 | Ht 61.0 in | Wt 180.0 lb

## 2016-11-15 DIAGNOSIS — R1310 Dysphagia, unspecified: Secondary | ICD-10-CM | POA: Diagnosis not present

## 2016-11-15 DIAGNOSIS — I5033 Acute on chronic diastolic (congestive) heart failure: Secondary | ICD-10-CM

## 2016-11-15 DIAGNOSIS — I2510 Atherosclerotic heart disease of native coronary artery without angina pectoris: Secondary | ICD-10-CM

## 2016-11-15 DIAGNOSIS — R1319 Other dysphagia: Secondary | ICD-10-CM

## 2016-11-15 DIAGNOSIS — I252 Old myocardial infarction: Secondary | ICD-10-CM

## 2016-11-15 DIAGNOSIS — I10 Essential (primary) hypertension: Secondary | ICD-10-CM

## 2016-11-15 DIAGNOSIS — E039 Hypothyroidism, unspecified: Secondary | ICD-10-CM | POA: Diagnosis not present

## 2016-11-15 DIAGNOSIS — I639 Cerebral infarction, unspecified: Secondary | ICD-10-CM

## 2016-11-15 DIAGNOSIS — E1140 Type 2 diabetes mellitus with diabetic neuropathy, unspecified: Secondary | ICD-10-CM | POA: Diagnosis not present

## 2016-11-15 LAB — BASIC METABOLIC PANEL
BUN: 18 mg/dL (ref 7–25)
CO2: 28 mmol/L (ref 20–32)
Calcium: 9.7 mg/dL (ref 8.6–10.4)
Chloride: 103 mmol/L (ref 98–110)
Creat: 0.78 mg/dL (ref 0.50–1.05)
Glucose, Bld: 123 mg/dL — ABNORMAL HIGH (ref 65–99)
Potassium: 4.3 mmol/L (ref 3.5–5.3)
Sodium: 140 mmol/L (ref 135–146)

## 2016-11-15 LAB — TSH: TSH: 7.08 mIU/L — ABNORMAL HIGH

## 2016-11-15 MED ORDER — LEVOTHYROXINE SODIUM 50 MCG PO TABS: 50.0000 ug | ORAL_TABLET | Freq: Every day | ORAL | 0 refills | Status: DC

## 2016-11-15 MED ORDER — FUROSEMIDE 40 MG PO TABS: 40.0000 mg | ORAL_TABLET | Freq: Two times a day (BID) | ORAL | 0 refills | Status: DC

## 2016-11-15 MED ORDER — CARVEDILOL 3.125 MG PO TABS: 3.1250 mg | ORAL_TABLET | Freq: Two times a day (BID) | ORAL | 3 refills | Status: DC

## 2016-11-15 MED ORDER — ATORVASTATIN CALCIUM 80 MG PO TABS: 80.0000 mg | ORAL_TABLET | Freq: Every day | ORAL | 0 refills | Status: DC

## 2016-11-15 MED ORDER — CLOPIDOGREL BISULFATE 75 MG PO TABS: 75.0000 mg | ORAL_TABLET | Freq: Every day | ORAL | 0 refills | Status: DC

## 2016-11-15 MED ORDER — ASPIRIN 325 MG PO TBEC: 325.0000 mg | DELAYED_RELEASE_TABLET | Freq: Every day | ORAL | Status: DC

## 2016-11-15 MED ORDER — PANTOPRAZOLE SODIUM 40 MG PO TBEC: 40.0000 mg | DELAYED_RELEASE_TABLET | Freq: Every day | ORAL | 0 refills | Status: DC

## 2016-11-15 MED ORDER — METFORMIN HCL 1000 MG PO TABS: 1000.0000 mg | ORAL_TABLET | Freq: Two times a day (BID) | ORAL | 1 refills | Status: DC

## 2016-11-15 MED ORDER — GABAPENTIN 300 MG PO CAPS: 300.0000 mg | ORAL_CAPSULE | Freq: Two times a day (BID) | ORAL | 2 refills | Status: DC

## 2016-11-15 MED ORDER — GLIMEPIRIDE 4 MG PO TABS: 4.0000 mg | ORAL_TABLET | Freq: Every day | ORAL | 2 refills | Status: DC

## 2016-11-15 NOTE — Progress Notes (Signed)
Patient ID: Monica Carlson, female    DOB: 1965/10/21, 51 y.o.   MRN: [id number]  Chief Complaint  Patient presents with  . Hypertension    Allergies Patient has no known allergies.  Subjective:   Monica Carlson is a 51 y.o. female who presents to [facility] today.  HPI Here to establish care. Here with [facility] Par[facility] for coordinated care. Been diabetic since daughter was born in 79. Reports that was recently in hospital for [facility] exacerbation. BP has been running well usually per report but ran out of medication and has not had it all. No hypoglycemia. Checks sugars at different times but not sure if she needs to. No lesions on feet. Has neuropathy in feet and was started on neurontin in hospital. Was dx with CVA when was in hospital and was started on plavix. Was prior on ASA. Has been off of lipitor but thinks has been taking fenofibrate. Not sure if needs the coreg. Was dx with thyroid problem in hospital started on synthroid 50 mcg a day. No side effects. Mood is good. Reports that was put on the celexa in the hospital b/c stroke but reports that she feels good and does not want to take it. Mood is good, is not anxious. Not sad. Lives alone. Now has an apartment but was living out of her car a while back. Eats well and has food. No CP or SOB. Swelling in legs is better.   Hyperlipidemia  This is a chronic problem. The current episode started more than 1 year ago. Condition status: not sure if controlled. Recent lipid tests were reviewed and are variable. Exacerbating diseases include diabetes and hypothyroidism. Factors aggravating her hyperlipidemia include fatty foods. Pertinent negatives include no chest pain, focal sensory loss, focal weakness, leg pain, myalgias or shortness of breath. Current antihyperlipidemic treatment includes fibric acid derivatives (was on lipitor but not sure if needs it). Risk factors for coronary artery disease include dyslipidemia, diabetes  mellitus, family history, hypertension, a sedentary lifestyle and post-menopausal.  Diabetes  She presents for her follow-up diabetic visit. She has type 2 diabetes mellitus. No MedicAlert identification noted. Her disease course has been fluctuating. There are no hypoglycemic associated symptoms. Associated symptoms include foot paresthesias. Pertinent negatives for diabetes include no blurred vision, no chest pain, no fatigue, no foot ulcerations, no polydipsia, no polyphagia, no polyuria and no weakness. There are no hypoglycemic complications. Symptoms are improving. Diabetic complications include a CVA, heart disease and peripheral neuropathy. Pertinent negatives for diabetic complications include no PVD. Current diabetic treatment includes diet and oral agent (dual therapy). She is compliant with treatment most of the time. Her weight is stable. She is following a diabetic and generally healthy diet. She has not had a previous visit with a dietitian. She participates in exercise intermittently. Her home blood glucose trend is fluctuating minimally. Her overall blood glucose range is 110-130 mg/dl. An ACE inhibitor/angiotensin II receptor blocker is being taken. She does not see a podiatrist.Eye exam is current.  Hypertension  This is a chronic problem. The current episode started more than 1 year ago. The problem has been waxing and waning since onset. The problem is controlled. Pertinent negatives include no anxiety, blurred vision, chest pain, malaise/fatigue, neck pain, palpitations, peripheral edema or shortness of breath. There are no associated agents to hypertension. Hypertensive end-organ damage includes CVA. There is no history of PVD.    Past Medical History:  Diagnosis Date  . Cervical cancer ([place])   .  CHF (congestive heart failure) ([place]) 07-2014  . Diabetes mellitus without complication ([place]) 5170  . High cholesterol   . Hypertension   . MI (myocardial infarction) ([place])    2004  .  Stroke [facility]) 09/2014, 2013    Past Surgical History:  Procedure Laterality Date  . CHOLECYSTECTOMY  1992  . CORONARY STENT PLACEMENT  2004  . TUBAL LIGATION      Family History  Problem Relation Age of Onset  . Migraines Mother   . Heart disease Father   . Hypertension Father   . Diabetes Father   . Migraines Maternal Grandfather   . Heart attack Maternal Grandfather   . Colon cancer Neg Hx   . Colon polyps Neg Hx      Social History   Social History  . Marital status: Legally Separated    Spouse name: N/A  . Number of children: N/A  . Years of education: N/A   Social History Main Topics  . Smoking status: Never Smoker  . Smokeless tobacco: Never Used  . Alcohol use No  . Drug use: No  . Sexual activity: Yes    Birth control/ protection: None, Surgical   Other Topics Concern  . None   Social History Narrative  . None    Review of Systems  Constitutional: Negative for fatigue and malaise/fatigue.  Eyes: Negative for blurred vision.  Respiratory: Negative for shortness of breath.   Cardiovascular: Negative for chest pain and palpitations.  Endocrine: Negative for polydipsia, polyphagia and polyuria.  Musculoskeletal: Negative for myalgias and neck pain.  Neurological: Negative for focal weakness and weakness.     Objective:   BP (!) 142/100   Pulse 87   Ht 5\' 1"  (1.549 m)   Wt 180 lb (81.6 kg)   SpO2 95%   BMI 34.01 kg/m   Physical Exam  Constitutional: She is oriented to person, place, and time. She appears well-developed and well-nourished.  HENT:  Head: Normocephalic and atraumatic.  Nose: Nose normal.  Eyes: Pupils are equal, round, and reactive to light. EOM are normal.  Neck: Normal range of motion. Neck supple. No JVD present. No thyromegaly present.  Cardiovascular: Normal rate, regular rhythm and normal heart sounds.  Exam reveals no friction rub.   No murmur heard. Pulmonary/Chest: Effort normal and breath sounds normal. No stridor.    Abdominal: Soft. Bowel sounds are normal.  Musculoskeletal: Normal range of motion.  Lymphadenopathy:    She has no cervical adenopathy.  Neurological: She is alert and oriented to person, place, and time. No cranial nerve deficit.  Skin: Skin is warm and dry. No rash noted.  Psychiatric: She has a normal mood and affect. Her behavior is normal. Judgment and thought content normal.  Vitals reviewed.    Assessment and Plan   1. Coronary artery disease involving native coronary artery of native heart without angina pectoris Will place referral to cardiology. Patient to get weigh scales and monitor weight. Compliance with medication stressed.  Diet and salt discussed.  - aspirin 325 MG EC tablet; Take 1 tablet (325 mg total) by mouth daily. - clopidogrel (PLAVIX) 75 MG tablet; Take 1 tablet (75 mg total) by mouth daily with breakfast.  Dispense: 30 tablet; Refill: 0  2. Acute on chronic CHF (congestive heart failure) (HCC) [facility] secondary to CAD/HTN - furosemide (LASIX) 40 MG tablet; Take 1 tablet (40 mg total) by mouth 2 (two) times daily.  Dispense: 60 tablet; Refill: 0 - Basic metabolic panel Refilled coreg.  3. Essential hypertension Compliance discussed. Restart medications. All refilled today.  Lifestyle modifications discussed with patient including a diet emphasizing vegetables, fruits, and whole grains. Limiting intake of sodium to less than 2,400 mg per day.  Recommendations discussed include consuming low-fat dairy products, poultry, fish, legumes, non-tropical vegetable oils, and nuts; and limiting intake of sweets, sugar-sweetened beverages, and red meat. Discussed following a plan such as the Dietary Approaches to Stop Hypertension (DASH) diet. Patient to read up on this diet.    4. Acute ischemic stroke ([place]) Completed PT at home. Improved. Review chart and workup. Continue plavix. Patient denies any esophageal dysmotility or problems swallowing and eating regualr diet  without problems. Encouraged to keep GI appt.   5. Hypothyroidism, unspecified type Check labs. Compliance and way to take the medication discussed.  - levothyroxine (SYNTHROID, LEVOTHROID) 50 MCG tablet; Take 1 tablet (50 mcg total) by mouth daily before breakfast.  Dispense: 30 tablet; Refill: 0 - TSH  6. History of MI (myocardial infarction) Secondary prevention, continue statin. Patient counseled in detail regarding the risks of medication. Told to call or return to clinic if develop any worrisome signs or symptoms. Patient voiced understanding.   - atorvastatin (LIPITOR) 80 MG tablet; Take 1 tablet (80 mg total) by mouth daily.  Dispense: 90 tablet; Refill: 0  7. Type 2 diabetes mellitus with diabetic neuropathy, without long-term current use of insulin (HCC)  - metFORMIN (GLUCOPHAGE) 1000 MG tablet; Take 1 tablet (1,000 mg total) by mouth 2 (two) times daily.  Dispense: 60 tablet; Refill: 1 - glimepiride (AMARYL) 4 MG tablet; Take 1 tablet (4 mg total) by mouth daily before breakfast.  Dispense: 60 tablet; Refill: 2 - gabapentin (NEURONTIN) 300 MG capsule; Take 1 capsule (300 mg total) by mouth 2 (two) times daily.  Dispense: 60 capsule; Refill: 2 - Amb ref to Medical Nutrition Therapy-MNT  8. Esophageal dysphagia Keep appt with GI.  - pantoprazole (PROTONIX) 40 MG tablet; Take 1 tablet (40 mg total) by mouth daily.  Dispense: 30 tablet; Refill: 0    Return in about 2 weeks (around 11/29/2016) for follow up. Caren Macadam, MD 11/15/2016

## 2016-11-18 ENCOUNTER — Ambulatory Visit: Payer: Self-pay | Admitting: Family Medicine

## 2016-11-29 ENCOUNTER — Telehealth: Payer: Self-pay | Admitting: Family Medicine

## 2016-11-29 NOTE — Telephone Encounter (Signed)
Otila Kluver, RN with [facility], left message regarding patient. She states BP today was 158/98 and has been gradually increasing over the last few days. When patient was in the hospital, her BP meds were stopped. If anything new is called in, or any questions, callback for Otila Kluver is [phone]

## 2016-11-30 NOTE — Telephone Encounter (Signed)
Patient informed of message below, verbalized understanding. Appt scheduled.

## 2016-11-30 NOTE — Telephone Encounter (Signed)
Please advise to continue to check and record reading and time of day. Make sure taking all medications. Should be checking weight too. Advise to schedule follow up.  Gwen Her. Mannie Stabile, MD

## 2016-12-13 ENCOUNTER — Ambulatory Visit (INDEPENDENT_AMBULATORY_CARE_PROVIDER_SITE_OTHER): Payer: Medicaid Other | Admitting: Family Medicine

## 2016-12-13 ENCOUNTER — Other Ambulatory Visit: Payer: Self-pay | Admitting: Family Medicine

## 2016-12-13 ENCOUNTER — Encounter: Payer: Self-pay | Admitting: Family Medicine

## 2016-12-13 VITALS — BP 130/74 | HR 75 | Temp 97.5°F | Resp 16 | Ht 61.0 in | Wt 181.2 lb

## 2016-12-13 DIAGNOSIS — Z8673 Personal history of transient ischemic attack (TIA), and cerebral infarction without residual deficits: Secondary | ICD-10-CM | POA: Diagnosis not present

## 2016-12-13 DIAGNOSIS — I10 Essential (primary) hypertension: Secondary | ICD-10-CM

## 2016-12-13 DIAGNOSIS — Z79899 Other long term (current) drug therapy: Secondary | ICD-10-CM

## 2016-12-13 DIAGNOSIS — I2510 Atherosclerotic heart disease of native coronary artery without angina pectoris: Secondary | ICD-10-CM | POA: Diagnosis not present

## 2016-12-13 DIAGNOSIS — E1169 Type 2 diabetes mellitus with other specified complication: Secondary | ICD-10-CM

## 2016-12-13 DIAGNOSIS — E038 Other specified hypothyroidism: Secondary | ICD-10-CM

## 2016-12-13 DIAGNOSIS — E063 Autoimmune thyroiditis: Secondary | ICD-10-CM | POA: Diagnosis not present

## 2016-12-13 DIAGNOSIS — E1140 Type 2 diabetes mellitus with diabetic neuropathy, unspecified: Secondary | ICD-10-CM

## 2016-12-13 DIAGNOSIS — E782 Mixed hyperlipidemia: Secondary | ICD-10-CM | POA: Diagnosis not present

## 2016-12-13 NOTE — Telephone Encounter (Signed)
Seen [phone]

## 2016-12-13 NOTE — Progress Notes (Signed)
Patient ID: Monica Carlson, female    DOB: [birth year], 51 y.o.   MRN: [id number]  Chief Complaint  Patient presents with  . Follow-up    Allergies Patient has no known allergies.  Subjective:   Monica Carlson is a 51 y.o. female who presents to [facility] today.  HPI Here with the community care social worker for a follow up visit. Comes in today b/c is concerned about BP. Reports that she has been checking her blood pressure at home and it is running in the 150/90. Did not bring her cuff in today and it has not been checked to see if it is reading accurately. Reports she has been checking her weight at home and her weight has been stable. Reports that she has been feeling well but has had a lot of life stressors. Ran out of her food stamps and has not had food for several days but the social worker is going to help her get food today. Patient reports that her mood is good she is not sad or anxious. She reports she is sleeping well. Denies any suicidal or homicidal ideations. Is taking all of her medicines. Medications now come in bubble packets from [facility] [place].  She reports that she has not seen a cardiologist in many years despite 2 recent hospitalizations for [facility] and stroke. Has completed all of the physical therapy and rehabilitation from her stroke. Is taking all of her medications. Reports that her sugars are running well. Used to be on metformin but that was stopped. Denies any hypoglycemic episodes. Tries to eat a healthy diet but is constrained by her food sources. Has an upcoming scheduled appointment with diabetic nutrition. Reports that she does not know why she had the last 2 exacerbations of heart failure. Has had a previous heart attack and stent placed back in 2004.  Patient denies any chest pain or shortness of breath.    Past Medical History:  Diagnosis Date  . Cervical cancer ([place])   . CHF (congestive heart failure) ([place]) 07-2014  .  Diabetes mellitus without complication ([place]) 3151  . High cholesterol   . Hypertension   . MI (myocardial infarction) ([place])    2004  . Stroke [facility]) 09/2014, 2013    Past Surgical History:  Procedure Laterality Date  . CHOLECYSTECTOMY  1992  . CORONARY STENT PLACEMENT  2004  . TUBAL LIGATION      Family History  Problem Relation Age of Onset  . Migraines Mother   . Heart disease Father   . Hypertension Father   . Diabetes Father   . Migraines Maternal Grandfather   . Heart attack Maternal Grandfather   . Colon cancer Neg Hx   . Colon polyps Neg Hx      Social History   Social History  . Marital status: Legally Separated    Spouse name: N/A  . Number of children: N/A  . Years of education: N/A   Social History Main Topics  . Smoking status: Never Smoker  . Smokeless tobacco: Never Used  . Alcohol use No  . Drug use: No  . Sexual activity: Yes    Birth control/ protection: None, Surgical   Other Topics Concern  . None   Social History Narrative  . None   Current Outpatient Prescriptions on File Prior to Visit  Medication Sig Dispense Refill  . aspirin 325 MG EC tablet Take 1 tablet (325 mg total) by mouth daily.    Marland Kitchen  atorvastatin (LIPITOR) 80 MG tablet Take 1 tablet (80 mg total) by mouth daily. 90 tablet 0  . carvedilol (COREG) 3.125 MG tablet Take 1 tablet (3.125 mg total) by mouth 2 (two) times daily with a meal. 60 tablet 3  . clopidogrel (PLAVIX) 75 MG tablet Take 1 tablet (75 mg total) by mouth daily with breakfast. 30 tablet 0  . furosemide (LASIX) 40 MG tablet Take 1 tablet (40 mg total) by mouth 2 (two) times daily. 60 tablet 0  . gabapentin (NEURONTIN) 300 MG capsule Take 1 capsule (300 mg total) by mouth 2 (two) times daily. 60 capsule 2  . glimepiride (AMARYL) 4 MG tablet Take 1 tablet (4 mg total) by mouth daily before breakfast. 60 tablet 2  . levothyroxine (SYNTHROID, LEVOTHROID) 50 MCG tablet Take 1 tablet (50 mcg total) by mouth daily before  breakfast. 30 tablet 0  . pantoprazole (PROTONIX) 40 MG tablet Take 1 tablet (40 mg total) by mouth daily. 30 tablet 0  . nitroGLYCERIN (NITROSTAT) 0.4 MG SL tablet Place 1 tablet (0.4 mg total) under the tongue every 5 (five) minutes as needed for chest pain. (Patient not taking: Reported on 12/13/2016) 30 tablet 4   No current facility-administered medications on file prior to visit.     Review of Systems  Constitutional: Negative for chills, diaphoresis and fatigue.  Respiratory: Negative for cough, choking, chest tightness and shortness of breath.   Cardiovascular: Negative for chest pain and palpitations.  Gastrointestinal: Negative for abdominal pain, diarrhea and nausea.  Endocrine: Negative for polydipsia, polyphagia and polyuria.  Genitourinary: Negative for difficulty urinating, dysuria and urgency.  Musculoskeletal: Negative for arthralgias, back pain and myalgias.  Skin: Negative for rash.  Neurological: Negative for dizziness, tremors, syncope, speech difficulty, weakness and light-headedness.  Hematological: Negative for adenopathy. Does not bruise/bleed easily.  Psychiatric/Behavioral: Negative for dysphoric mood and suicidal ideas. The patient is not nervous/anxious.      Objective:   BP 130/74 (BP Location: Left Arm, Patient Position: Sitting, Cuff Size: Normal)   Pulse 75   Temp (!) 97.5 F (36.4 C) (Other (Comment))   Resp 16   Ht 5\' 1"  (1.549 m)   Wt 181 lb 4 oz (82.2 kg)   SpO2 97%   BMI 34.25 kg/m   Physical Exam  Constitutional: She is oriented to person, place, and time. She appears well-developed and well-nourished. No distress.  HENT:  Head: Normocephalic and atraumatic.  Eyes: Pupils are equal, round, and reactive to light.  Neck: Normal range of motion. Neck supple. No thyromegaly present.  Cardiovascular: Normal rate, regular rhythm and normal heart sounds.   Pulmonary/Chest: Effort normal and breath sounds normal. No respiratory distress.    Abdominal: Soft. Bowel sounds are normal.  Musculoskeletal:       Right foot: There is normal range of motion and no deformity.       Left foot: There is normal range of motion and no deformity.  Feet:  Right Foot:  Protective Sensation: 7 sites tested. 7 sites sensed.  Skin Integrity: Negative for ulcer, blister, skin breakdown or erythema.  Left Foot:  Protective Sensation: 7 sites tested. 7 sites sensed.  Skin Integrity: Negative for ulcer, blister, skin breakdown or erythema.  Neurological: She is alert and oriented to person, place, and time. No cranial nerve deficit.  Skin: Skin is warm and dry. No erythema.  Psychiatric: Her behavior is normal. Judgment and thought content normal. Her affect is blunt. Her speech is delayed. Cognition and  memory are impaired. She expresses no homicidal and no suicidal ideation. She expresses no suicidal plans and no homicidal plans.  Memory impairment for recent and remote  Nursing note and vitals reviewed.    Assessment and Plan   1. Essential hypertension Patient told to bring in her blood pressure cuff to her next visit so that we can check and correlate her cuff with ours in the office. Blood pressure seems well controlled today. Patient recommended to monitor her salt intake and continue daily weights. - Basic metabolic panel  2. Mixed hyperlipidemia Patient will return to clinic prior to her next appointment and check her lipid level. Will adjust medication as needed to get her goal LDL achieved. - Lipid panel  3. History of CVA (cerebrovascular accident) Continue aspirin and Plavix as directed.  4. Hypothyroidism due to Hashimoto's thyroiditis Patient will continue her thyroid and recheck levels in 2 months prior to her next visit. - TSH  5. Coronary artery disease involving native coronary artery of native heart without angina pectoris Patient has had no cardiology evaluation in quite some time with history of coronary artery  disease and 2 exacerbations of congestive heart failure. She has never been in a cardiac rehabilitation program but is quite asymptomatic at this time. She will continue current medications as directed. Referral placed - Ambulatory referral to Cardiology  6. High risk medication use Check labs at follow-up - Hepatic function panel  7. Type 2 diabetes mellitus with other specified complication, without long-term current use of insulin (HCC) Diet, weight loss, and exercise as tolerated recommended. She will keep her visit with [facility] for nutrition consultation/diabetic education. Foot exam negative. - Hemoglobin A1c - Microalbumin / creatinine urine ratio Patient will call back with the name of her eye doctor so we can double check when her last diabetic eye exam was performed. Is asked to bring in a record of her blood sugars to the next visit and she was instructed on timing of fingerstick blood sugars and relation to meals. Patient defers pneumonia and influenza vaccines. Foot care discussed with patient.  Return in about 2 months (around 02/12/2017) for follow up. Caren Macadam, MD 12/14/2016

## 2016-12-13 NOTE — Patient Instructions (Signed)
Call back with the name of eye doctor  Bring in blood sugar and blood pressure cuff to the next visit.  Go see the cardiologist  Keep appt with diabetic nutrition  Get labs done 2-3 days prior to follow up with me.

## 2016-12-14 ENCOUNTER — Encounter: Payer: Self-pay | Admitting: Family Medicine

## 2016-12-19 ENCOUNTER — Encounter: Payer: Self-pay | Admitting: Gastroenterology

## 2016-12-19 ENCOUNTER — Ambulatory Visit (INDEPENDENT_AMBULATORY_CARE_PROVIDER_SITE_OTHER): Payer: Medicaid Other | Admitting: Gastroenterology

## 2016-12-19 ENCOUNTER — Other Ambulatory Visit: Payer: Self-pay | Admitting: Family Medicine

## 2016-12-19 VITALS — BP 148/94 | HR 83 | Temp 97.1°F | Ht 61.0 in | Wt 179.4 lb

## 2016-12-19 DIAGNOSIS — I2510 Atherosclerotic heart disease of native coronary artery without angina pectoris: Secondary | ICD-10-CM

## 2016-12-19 DIAGNOSIS — I5033 Acute on chronic diastolic (congestive) heart failure: Secondary | ICD-10-CM

## 2016-12-19 DIAGNOSIS — R1319 Other dysphagia: Secondary | ICD-10-CM

## 2016-12-19 DIAGNOSIS — R1310 Dysphagia, unspecified: Secondary | ICD-10-CM

## 2016-12-19 DIAGNOSIS — E039 Hypothyroidism, unspecified: Secondary | ICD-10-CM

## 2016-12-19 NOTE — Assessment & Plan Note (Addendum)
Denies ongoing dysphagia.  No GI symptoms.  Given recent acute stroke back in August and further cardiology testing, will allow further time for recuperation prior to considering screening colonoscopy.  We will have her come back in 3 months.  If she has any problems in the interim she will let us know.  Pantoprazole 40 mg daily to be continued.

## 2016-12-19 NOTE — Patient Instructions (Signed)
1. Return to the office in 3 months for consideration of colonoscopy. 2. Call if you have any problems with swallowing, bowel movements, blood in the stool in the interim.

## 2016-12-19 NOTE — Progress Notes (Signed)
Primary Care Physician: Caren Macadam, MD  Primary Gastroenterologist:  Garfield Cornea, MD   Chief Complaint  Patient presents with  . Dysphagia    No longer having this problem    HPI: Monica Carlson is a 51 y.o. female here for hospital follow-up.  She was seen back in August during hospitalization when she presented with acute on chronic heart failure, acute stroke while inpatient.  GI was consulted for dysphagia and odynophagia.  Evaluated by speech therapy with bedside swallowing evaluation noting intact oropharyngeal swallowing phases, complaints of globus sensation.  Barium pill esophagram performed noting esophageal dysmotility but no stricture.  The patient is on Plavix and aspirin for at least the next 3 months per neurology recommendations.  She still has to see cardiology for outpatient ischemic workup but was seen initially in the hospital.  The patient denies any ongoing dysphagia.  Appetite good.  No heartburn.  Bowel function regular.  No blood in the stool or melena.  No abdominal pain.  No prior colonoscopy.  She has been assisted by social workers here to make sure she makes her appointments.  Current Outpatient Prescriptions  Medication Sig Dispense Refill  . aspirin 325 MG EC tablet Take 1 tablet (325 mg total) by mouth daily.    Marland Kitchen atorvastatin (LIPITOR) 80 MG tablet Take 1 tablet (80 mg total) by mouth daily. 90 tablet 0  . carvedilol (COREG) 3.125 MG tablet Take 1 tablet (3.125 mg total) by mouth 2 (two) times daily with a meal. 60 tablet 3  . clopidogrel (PLAVIX) 75 MG tablet Take 1 tablet (75 mg total) by mouth daily with breakfast. 30 tablet 0  . furosemide (LASIX) 40 MG tablet Take 1 tablet (40 mg total) by mouth 2 (two) times daily. 60 tablet 0  . gabapentin (NEURONTIN) 300 MG capsule Take 1 capsule (300 mg total) by mouth 2 (two) times daily. 60 capsule 2  . glimepiride (AMARYL) 4 MG tablet Take 1 tablet (4 mg total) by mouth daily before breakfast. 60 tablet  2  . levothyroxine (SYNTHROID, LEVOTHROID) 50 MCG tablet Take 1 tablet (50 mcg total) by mouth daily before breakfast. 30 tablet 0  . metFORMIN (GLUCOPHAGE) 1000 MG tablet TAKE ONE TABLET BY MOUTH TWICE DAILY 60 tablet 1  . nitroGLYCERIN (NITROSTAT) 0.4 MG SL tablet Place 1 tablet (0.4 mg total) under the tongue every 5 (five) minutes as needed for chest pain. 30 tablet 4  . pantoprazole (PROTONIX) 40 MG tablet Take 1 tablet (40 mg total) by mouth daily. 30 tablet 0   No current facility-administered medications for this visit.     Allergies as of 12/19/2016  . (No Known Allergies)    ROS:  General: Negative for anorexia, weight loss, fever, chills, fatigue, weakness. ENT: Negative for hoarseness, difficulty swallowing , nasal congestion. CV: Negative for chest pain, angina, palpitations, dyspnea on exertion, peripheral edema.  Respiratory: Negative for dyspnea at rest, dyspnea on exertion, cough, sputum, wheezing.  GI: See history of present illness. GU:  Negative for dysuria, hematuria, urinary incontinence, urinary frequency, nocturnal urination.  Endo: Negative for unusual weight change.    Physical Examination:   BP (!) 148/94   Pulse 83   Temp (!) 97.1 F (36.2 C) (Oral)   Ht 5\' 1"  (1.549 m)   Wt 179 lb 6.4 oz (81.4 kg)   BMI 33.90 kg/m   General: Well-nourished, well-developed in no acute distress.  Eyes: No icterus. Mouth: Oropharyngeal mucosa moist and pink ,  no lesions erythema or exudate. Lungs: Clear to auscultation bilaterally.  Heart: Regular rate and rhythm, no murmurs rubs or gallops.  Abdomen: Bowel sounds are normal, nontender, nondistended, no hepatosplenomegaly or masses, no abdominal bruits or hernia , no rebound or guarding.   Extremities: No lower extremity edema. No clubbing or deformities. Neuro: Alert and oriented x 4   Skin: Warm and dry, no jaundice.   Psych: Alert and cooperative, normal mood and affect.  Labs:  Lab Results  Component Value  Date   CREATININE 0.78 11/15/2016   BUN 18 11/15/2016   NA 140 11/15/2016   K 4.3 11/15/2016   CL 103 11/15/2016   CO2 28 11/15/2016   Lab Results  Component Value Date   ALT 11 (L) 10/07/2016   AST 15 10/07/2016   ALKPHOS 55 10/07/2016   BILITOT 1.3 (H) 10/07/2016   Lab Results  Component Value Date   WBC 9.7 10/11/2016   HGB 13.9 10/11/2016   HCT 42.6 10/11/2016   MCV 94.7 10/11/2016   PLT 244 10/11/2016   Lab Results  Component Value Date   TSH 7.08 (H) 11/15/2016    Imaging Studies: No results found.

## 2016-12-19 NOTE — Progress Notes (Signed)
CC'ED TO PCP 

## 2016-12-21 ENCOUNTER — Ambulatory Visit: Payer: Medicaid Other | Admitting: Cardiology

## 2016-12-22 ENCOUNTER — Ambulatory Visit: Payer: Medicaid Other | Admitting: Nutrition

## 2016-12-30 ENCOUNTER — Encounter: Payer: Self-pay | Admitting: *Deleted

## 2017-01-02 ENCOUNTER — Ambulatory Visit: Payer: Medicaid Other | Admitting: Cardiology

## 2017-01-16 ENCOUNTER — Encounter: Payer: Self-pay | Admitting: *Deleted

## 2017-01-16 ENCOUNTER — Encounter: Payer: Self-pay | Admitting: Cardiovascular Disease

## 2017-01-16 ENCOUNTER — Ambulatory Visit: Payer: Medicaid Other | Admitting: Cardiovascular Disease

## 2017-01-16 ENCOUNTER — Telehealth: Payer: Self-pay | Admitting: Cardiovascular Disease

## 2017-01-16 VITALS — BP 122/90 | HR 78 | Ht 61.0 in | Wt 189.0 lb

## 2017-01-16 DIAGNOSIS — Z955 Presence of coronary angioplasty implant and graft: Secondary | ICD-10-CM

## 2017-01-16 DIAGNOSIS — I10 Essential (primary) hypertension: Secondary | ICD-10-CM | POA: Diagnosis not present

## 2017-01-16 DIAGNOSIS — I25118 Atherosclerotic heart disease of native coronary artery with other forms of angina pectoris: Secondary | ICD-10-CM | POA: Diagnosis not present

## 2017-01-16 DIAGNOSIS — I519 Heart disease, unspecified: Secondary | ICD-10-CM

## 2017-01-16 DIAGNOSIS — I5042 Chronic combined systolic (congestive) and diastolic (congestive) heart failure: Secondary | ICD-10-CM

## 2017-01-16 DIAGNOSIS — I429 Cardiomyopathy, unspecified: Secondary | ICD-10-CM | POA: Diagnosis not present

## 2017-01-16 DIAGNOSIS — E785 Hyperlipidemia, unspecified: Secondary | ICD-10-CM

## 2017-01-16 DIAGNOSIS — Z8673 Personal history of transient ischemic attack (TIA), and cerebral infarction without residual deficits: Secondary | ICD-10-CM | POA: Diagnosis not present

## 2017-01-16 MED ORDER — SACUBITRIL-VALSARTAN 24-26 MG PO TABS: 1.0000 | ORAL_TABLET | Freq: Two times a day (BID) | ORAL | 0 refills | Status: DC

## 2017-01-16 MED ORDER — SACUBITRIL-VALSARTAN 24-26 MG PO TABS: 1.0000 | ORAL_TABLET | Freq: Two times a day (BID) | ORAL | 6 refills | Status: DC

## 2017-01-16 NOTE — Patient Instructions (Signed)
Medication Instructions:   Begin Entresto 24/26mg  twice a day.  Continue all other medications.    Labwork: none  Testing/Procedures:  Your physician has requested that you have a lexiscan myoview. For further information please visit [email or web address]. Please follow instruction sheet, as given.  Office will contact with results via phone or letter.    Follow-Up: 2 months   Any Other Special Instructions Will Be Listed Below (If Applicable).  If you need a refill on your cardiac medications before your next appointment, please call your pharmacy.

## 2017-01-16 NOTE — Telephone Encounter (Signed)
lexiscan - cardiomyopathy Scheduled at [facility] Jan 31, 2017 arrive at 10:15

## 2017-01-16 NOTE — Progress Notes (Signed)
SUBJECTIVE: The patient presents for follow-up of cardiomyopathy with severe left ventricular dysfunction and coronary artery disease.  When I saw her while hospitalized in August 2018, left ventricular systolic function was severely reduced, LVEF 20%, with multiple wall motion abnormalities and grade dysfunction.  She was hospitalized for acute combined systolic and diastolic heart failure.  She has a history of bare-metal stent to the RCA in 2004.  She underwent a low risk nuclear stress test in May 2017.  The patient denies any symptoms of chest pain, palpitations, shortness of breath, lightheadedness, dizziness, leg swelling, orthopnea, PND, and syncope.  She tries to avoid eating salt.  She said her diastolic blood pressures have been elevated.    Review of Systems: As per "subjective", otherwise negative.  No Known Allergies  Current Outpatient Medications  Medication Sig Dispense Refill  . aspirin 325 MG EC tablet Take 1 tablet (325 mg total) by mouth daily.    Marland Kitchen atorvastatin (LIPITOR) 80 MG tablet Take 1 tablet (80 mg total) by mouth daily. 90 tablet 0  . carvedilol (COREG) 3.125 MG tablet Take 1 tablet (3.125 mg total) by mouth 2 (two) times daily with a meal. 60 tablet 3  . clopidogrel (PLAVIX) 75 MG tablet TAKE ONE TABLET BY MOUTH EVERY DAY WITH BREAKFAST 31 tablet 0  . furosemide (LASIX) 40 MG tablet TAKE ONE TABLET BY MOUTH TWICE DAILY 62 tablet 0  . gabapentin (NEURONTIN) 300 MG capsule Take 1 capsule (300 mg total) by mouth 2 (two) times daily. 60 capsule 2  . glimepiride (AMARYL) 4 MG tablet Take 1 tablet (4 mg total) by mouth daily before breakfast. 60 tablet 2  . levothyroxine (SYNTHROID, LEVOTHROID) 50 MCG tablet TAKE ONE TABLET BY MOUTH EVERY DAY BEFORE BREAKFAST 31 tablet 0  . metFORMIN (GLUCOPHAGE) 1000 MG tablet TAKE ONE TABLET BY MOUTH TWICE DAILY 60 tablet 1  . nitroGLYCERIN (NITROSTAT) 0.4 MG SL tablet Place 1 tablet (0.4 mg total) under the tongue every 5  (five) minutes as needed for chest pain. 30 tablet 4  . pantoprazole (PROTONIX) 40 MG tablet TAKE ONE TABLET BY MOUTH DAILY 30 tablet 0   No current facility-administered medications for this visit.     Past Medical History:  Diagnosis Date  . Cervical cancer ([place])   . CHF (congestive heart failure) ([place]) 07-2014  . Diabetes mellitus without complication ([place]) 8588  . High cholesterol   . Hypertension   . MI (myocardial infarction) ([place])    2004  . Stroke [facility]) 09/2014, 2013    Past Surgical History:  Procedure Laterality Date  . CHOLECYSTECTOMY  1992  . CORONARY STENT PLACEMENT  2004  . TUBAL LIGATION      Social History   Socioeconomic History  . Marital status: Legally Separated    Spouse name: Not on file  . Number of children: Not on file  . Years of education: Not on file  . Highest education level: Not on file  Social Needs  . Financial resource strain: Not on file  . Food insecurity - worry: Not on file  . Food insecurity - inability: Not on file  . Transportation needs - medical: Not on file  . Transportation needs - non-medical: Not on file  Occupational History  . Not on file  Tobacco Use  . Smoking status: Never Smoker  . Smokeless tobacco: Never Used  Substance and Sexual Activity  . Alcohol use: No  . Drug use: No  . Sexual  activity: Yes    Birth control/protection: None, Surgical  Other Topics Concern  . Not on file  Social History Narrative  . Not on file     Vitals:   01/16/17 1438  BP: 122/90  Pulse: 78  SpO2: 97%  Weight: 189 lb (85.7 kg)  Height: 5\' 1"  (1.549 m)    Wt Readings from Last 3 Encounters:  01/16/17 189 lb (85.7 kg)  12/19/16 179 lb 6.4 oz (81.4 kg)  12/13/16 181 lb 4 oz (82.2 kg)     PHYSICAL EXAM General: NAD HEENT: Normal. Neck: No JVD, no thyromegaly. Lungs: Clear to auscultation bilaterally with normal respiratory effort. CV: Regular rate and rhythm, normal S1/S2, no S3/S4, no murmur. No pretibial or  periankle edema. Abdomen: Soft, nontender, no distention.  Neurologic: Alert and oriented.  Psych: Normal affect. Skin: Normal. Musculoskeletal: No gross deformities.    ECG: Most recent ECG reviewed.   Labs: Lab Results  Component Value Date/Time   K 4.3 11/15/2016 12:36 PM   BUN 18 11/15/2016 12:36 PM   CREATININE 0.78 11/15/2016 12:36 PM   ALT 11 (L) 10/07/2016 02:13 AM   TSH 7.08 (H) 11/15/2016 12:36 PM   HGB 13.9 10/11/2016 06:51 AM     Lipids: Lab Results  Component Value Date/Time   LDLCALC 106 (H) 10/08/2016 06:28 AM   CHOL 147 10/08/2016 06:28 AM   TRIG 100 10/08/2016 06:28 AM   HDL 21 (L) 10/08/2016 06:28 AM       ASSESSMENT AND PLAN: 1.  Chronic combined systolic and diastolic heart failure: Euvolemic and stable.  Currently on carvedilol 3.125 mg twice daily and Lasix 40 mg twice daily.  I will start Entresto 24/26 mg twice daily.  I will obtain a nuclear stress test to evaluate for an ischemic etiology of her cardiomyopathy with newly reduced severe left ventricular dysfunction.  2.  Cardiomyopathy/severe left ventricular dysfunction: I will obtain a nuclear stress test to assess for any ischemic etiology.  Continue carvedilol.  I will start Entresto.  Echocardiogram will need to be repeated in several months.  If LVEF remains severely reduced, I will make a referral to EP to assess AICD candidacy.  3.  Coronary artery disease with history of bare-metal stent to the RCA in 2004: I will repeat a nuclear stress test given significant decline in LV function.  4.  Bilateral CVA: On aspirin, Plavix, and statin.  5.  Chronic hypertension: Diastolic blood pressure is mildly elevated.  I will monitor given addition of Entresto as noted above.  6.  Hyperlipidemia: Continue Lipitor.    Disposition: Follow up 2 months   Kate Sable, M.D., F.A.C.C.

## 2017-01-17 ENCOUNTER — Encounter: Payer: Self-pay | Admitting: Family Medicine

## 2017-01-17 LAB — LIPID PANEL
CHOLESTEROL: 176 mg/dL (ref <200)
HDL: 32 mg/dL — AB (ref >50)
LDL CHOLESTEROL (CALC): 101 mg/dL — AB
Non-HDL Cholesterol (Calc): 144 mg/dL (calc) — ABNORMAL HIGH (ref <130)
TRIGLYCERIDES: 322 mg/dL — AB (ref <150)
Total CHOL/HDL Ratio: 5.5 (calc) — ABNORMAL HIGH (ref <5.0)

## 2017-01-17 LAB — BASIC METABOLIC PANEL
BUN/Creatinine Ratio: 29 (calc) — ABNORMAL HIGH (ref 6–22)
BUN: 28 mg/dL — AB (ref 7–25)
CALCIUM: 9.3 mg/dL (ref 8.6–10.4)
CO2: 34 mmol/L — ABNORMAL HIGH (ref 20–32)
CREATININE: 0.95 mg/dL (ref 0.50–1.05)
Chloride: 97 mmol/L — ABNORMAL LOW (ref 98–110)
GLUCOSE: 69 mg/dL (ref 65–99)
Potassium: 3.9 mmol/L (ref 3.5–5.3)
Sodium: 139 mmol/L (ref 135–146)

## 2017-01-17 LAB — MICROALBUMIN / CREATININE URINE RATIO
Creatinine, Urine: 53 mg/dL (ref 20–275)
Microalb Creat Ratio: 21 mcg/mg creat (ref <30)
Microalb, Ur: 1.1 mg/dL

## 2017-01-17 LAB — HEPATIC FUNCTION PANEL
AG RATIO: 1.2 (calc) (ref 1.0–2.5)
ALBUMIN MSPROF: 4.1 g/dL (ref 3.6–5.1)
ALT: 15 U/L (ref 6–29)
AST: 15 U/L (ref 10–35)
Alkaline phosphatase (APISO): 72 U/L (ref 33–130)
BILIRUBIN DIRECT: 0.1 mg/dL (ref 0.0–0.2)
BILIRUBIN INDIRECT: 0.5 mg/dL (ref 0.2–1.2)
BILIRUBIN TOTAL: 0.6 mg/dL (ref 0.2–1.2)
GLOBULIN: 3.4 g/dL (ref 1.9–3.7)
Total Protein: 7.5 g/dL (ref 6.1–8.1)

## 2017-01-17 LAB — TSH: TSH: 5.98 m[IU]/L — AB

## 2017-01-17 LAB — HEMOGLOBIN A1C
Hgb A1c MFr Bld: 7 % of total Hgb — ABNORMAL HIGH (ref <5.7)
Mean Plasma Glucose: 154 (calc)
eAG (mmol/L): 8.5 (calc)

## 2017-01-18 ENCOUNTER — Other Ambulatory Visit: Payer: Self-pay

## 2017-01-18 ENCOUNTER — Ambulatory Visit: Payer: Medicaid Other | Admitting: Family Medicine

## 2017-01-18 ENCOUNTER — Encounter: Payer: Self-pay | Admitting: Family Medicine

## 2017-01-18 VITALS — BP 120/70 | HR 89 | Temp 98.9°F | Resp 16 | Ht 61.0 in | Wt 179.2 lb

## 2017-01-18 DIAGNOSIS — E1169 Type 2 diabetes mellitus with other specified complication: Secondary | ICD-10-CM | POA: Diagnosis not present

## 2017-01-18 DIAGNOSIS — Z1231 Encounter for screening mammogram for malignant neoplasm of breast: Secondary | ICD-10-CM

## 2017-01-18 DIAGNOSIS — I10 Essential (primary) hypertension: Secondary | ICD-10-CM | POA: Diagnosis not present

## 2017-01-18 DIAGNOSIS — Z79899 Other long term (current) drug therapy: Secondary | ICD-10-CM

## 2017-01-18 DIAGNOSIS — M25511 Pain in right shoulder: Secondary | ICD-10-CM

## 2017-01-18 DIAGNOSIS — E039 Hypothyroidism, unspecified: Secondary | ICD-10-CM | POA: Diagnosis not present

## 2017-01-18 DIAGNOSIS — E7800 Pure hypercholesterolemia, unspecified: Secondary | ICD-10-CM | POA: Diagnosis not present

## 2017-01-18 DIAGNOSIS — Z1239 Encounter for other screening for malignant neoplasm of breast: Secondary | ICD-10-CM

## 2017-01-18 MED ORDER — LEVOTHYROXINE SODIUM 75 MCG PO TABS: 75.0000 ug | ORAL_TABLET | Freq: Every day | ORAL | 0 refills | Status: DC

## 2017-01-18 NOTE — Progress Notes (Signed)
Patient ID: Monica Carlson, female    DOB: [birth year], 51 y.o.   MRN: [id number]  Chief Complaint  Patient presents with  . Follow-up  . Diabetes  . Hypertension    Allergies Patient has no known allergies.  Subjective:   Monica Carlson is a 51 y.o. female who presents to [facility] today.  HPI Ms. Pangilinan presents today for follow-up.  Since she has been seen last at our office she has been seen by cardiology.  Was placed on Entresto at that visit.  She presents today for follow-up for her blood pressure and her diabetes.  She came in and got lab several days ago.  Reports that she is feeling better.  Is checking her blood sugars and they seem to be running well.  She denies any lesions on her feet.  She does not want any vaccinations today.  Reports she has food at home and is trying to make good choices with her eating.  Reports that she has had some shoulder pain over the past month in her right shoulder.  Does not recall any incident that started the pain.  She does sleep on that side.  Has used some Tylenol and it is helped some.  She has no access to a heating pad.  Has been taking her Synthroid 50 mcg every day for the past several months.  Energy is still low at times.  Reports she does not feel sad or depressed.  Reports she does not worry.  Has a friend that is staying with her in her apartment.  Reports that she has friends that she talks to on the phone and often will visit her neighbor.    Is taking her cholesterol medication.Denies any pain in her muscles.   Shoulder Pain   The pain is present in the right shoulder. This is a recurrent problem. The current episode started more than 1 month ago. There has been no history of extremity trauma. The problem occurs intermittently. The problem has been unchanged. The quality of the pain is described as sharp. The pain is at a severity of 10/10. The pain is moderate. Pertinent negatives include no fever, inability to bear  weight, itching, joint locking, joint swelling, limited range of motion, numbness, stiffness or tingling. The symptoms are aggravated by activity. She has tried movement for the symptoms. The treatment provided no relief. Family history does not include gout or rheumatoid arthritis. There is no history of osteoarthritis or rheumatoid arthritis.  Thyroid Problem  Presents for follow-up visit. Symptoms include fatigue. Patient reports no anxiety, cold intolerance, constipation, depressed mood, diaphoresis, diarrhea, dry skin, hair loss, heat intolerance, hoarse voice, leg swelling, tremors, visual change or weight gain. The symptoms have been improving.    Past Medical History:  Diagnosis Date  . Cervical cancer ([place])    cryotherapy and colposcopy/[place]; 1991  . CHF (congestive heart failure) ([place]) 07-2014  . Diabetes mellitus without complication ([place]) 0865  . High cholesterol   . Hypertension   . MI (myocardial infarction) ([place])    2004  . Stroke [facility]) 09/2014, 2013    Past Surgical History:  Procedure Laterality Date  . CHOLECYSTECTOMY  1992  . CORONARY STENT PLACEMENT  2004  . TUBAL LIGATION      Family History  Problem Relation Age of Onset  . Migraines Mother   . Heart disease Father   . Hypertension Father   . Diabetes Father   . Migraines Maternal Grandfather   .  Heart attack Maternal Grandfather   . Colon cancer Neg Hx   . Colon polyps Neg Hx      Social History   Socioeconomic History  . Marital status: Legally Separated    Spouse name: None  . Number of children: None  . Years of education: None  . Highest education level: None  Social Needs  . Financial resource strain: None  . Food insecurity - worry: None  . Food insecurity - inability: None  . Transportation needs - medical: None  . Transportation needs - non-medical: None  Occupational History  . None  Tobacco Use  . Smoking status: Never Smoker  . Smokeless tobacco: Never Used    Substance and Sexual Activity  . Alcohol use: No  . Drug use: No  . Sexual activity: Yes    Birth control/protection: None, Surgical  Other Topics Concern  . None  Social History Narrative   Lives alone.    Has some friends.    Neighbors.    Current Outpatient Medications on File Prior to Visit  Medication Sig Dispense Refill  . aspirin 325 MG EC tablet Take 1 tablet (325 mg total) by mouth daily.    Marland Kitchen atorvastatin (LIPITOR) 80 MG tablet Take 1 tablet (80 mg total) by mouth daily. 90 tablet 0  . carvedilol (COREG) 3.125 MG tablet Take 1 tablet (3.125 mg total) by mouth 2 (two) times daily with a meal. 60 tablet 3  . clopidogrel (PLAVIX) 75 MG tablet TAKE ONE TABLET BY MOUTH EVERY DAY WITH BREAKFAST 31 tablet 0  . furosemide (LASIX) 40 MG tablet TAKE ONE TABLET BY MOUTH TWICE DAILY 62 tablet 0  . gabapentin (NEURONTIN) 300 MG capsule Take 1 capsule (300 mg total) by mouth 2 (two) times daily. 60 capsule 2  . glimepiride (AMARYL) 4 MG tablet Take 1 tablet (4 mg total) by mouth daily before breakfast. 60 tablet 2  . metFORMIN (GLUCOPHAGE) 1000 MG tablet TAKE ONE TABLET BY MOUTH TWICE DAILY 60 tablet 1  . nitroGLYCERIN (NITROSTAT) 0.4 MG SL tablet Place 1 tablet (0.4 mg total) under the tongue every 5 (five) minutes as needed for chest pain. 30 tablet 4  . pantoprazole (PROTONIX) 40 MG tablet TAKE ONE TABLET BY MOUTH DAILY 30 tablet 0  . sacubitril-valsartan (ENTRESTO) 24-26 MG Take 1 tablet by mouth 2 (two) times daily. 60 tablet 6   No current facility-administered medications on file prior to visit.     Review of Systems  Constitutional: Positive for fatigue. Negative for diaphoresis, fever and weight gain.  HENT: Negative for hoarse voice.   Gastrointestinal: Negative for constipation and diarrhea.  Endocrine: Negative for cold intolerance and heat intolerance.  Musculoskeletal: Negative for stiffness.  Skin: Negative for itching.  Neurological: Negative for tingling, tremors  and numbness.  Psychiatric/Behavioral: The patient is not nervous/anxious.      Objective:   BP 120/70 (BP Location: Left Arm, Patient Position: Sitting, Cuff Size: Normal)   Pulse 89   Temp 98.9 F (37.2 C) (Temporal)   Resp 16   Ht 5\' 1"  (1.549 m)   Wt 179 lb 4 oz (81.3 kg)   SpO2 95%   BMI 33.87 kg/m   Physical Exam  Constitutional: She is oriented to person, place, and time. She appears well-developed and well-nourished. No distress.  HENT:  Head: Normocephalic and atraumatic.  Eyes: Pupils are equal, round, and reactive to light.  Neck: Normal range of motion. Neck supple. No thyromegaly present.  Cardiovascular: Normal  rate, regular rhythm and normal heart sounds.  Pulmonary/Chest: Effort normal and breath sounds normal. No respiratory distress.  Musculoskeletal: She exhibits no edema.       Right shoulder: She exhibits tenderness. She exhibits normal range of motion, no bony tenderness, no swelling and no effusion.  Pain with external rotation of arm at the shoulder and internal rotation of arm at shoulder against resistance.  Negative Neer's and Hawkins test.  Neurological: She is alert and oriented to person, place, and time. No cranial nerve deficit.  Skin: Skin is warm and dry.  Nursing note and vitals reviewed.    Assessment and Plan   1. Hypothyroidism, unspecified type Will increase Synthroid at this time and plan on rechecking levels in 3 months.  Patient was counseled to take medication 30 minutes before 2 hours after eating.  She was told not to take with other medications. - levothyroxine (SYNTHROID, LEVOTHROID) 75 MCG tablet; Take 1 tablet (75 mcg total) by mouth daily.  Dispense: 90 tablet; Refill: 0 - TSH  2. Screening for breast cancer Will perform clinical breast exam at next visit.  Monthly self breast exam recommended. - MM Digital Screening; Future  3. Essential hypertension Blood pressure improved since addition of new medication.  Compliance  with medications discussed.  Continue to monitor salt intake.  4. High cholesterol Improved.  Continue statin therapy. - Lipid panel, recheck in 3 months.  Dose was just increased so hopefully at that time LDL will be at goal of less than 70.  5. Acute pain of right shoulder Suspect pain secondary to rotator cuff tendinitis. - DME Other see comment-given prescription for heating pad to use 20 minutes 4 times a day to affected area as needed.  Tylenol over-the-counter as directed. Given exercises to do for range of motion.  6. Type 2 diabetes mellitus with other specified complication, without long-term current use of insulin (HCC)   - Hemoglobin G9F - Basic metabolic panel Labs ordered and to be completed in 3 months.  Discussed with patient that her blood sugars are much better controlled.  A1c down from 9.93 months ago to now 7.0.  Continue medications as directed. Ophthalmology visit up-to-date.  Foot exam UTD.  7. High risk medication use Check secondary to statins. - Hepatic function panel Patient will return for complete physical exam including Pap, pelvic, and breast exam. Return in about 3 months (around 04/20/2017). Caren Macadam, MD 01/18/2017

## 2017-01-18 NOTE — Patient Instructions (Signed)
Shoulder Range of Motion Exercises Shoulder range of motion (ROM) exercises are designed to keep the shoulder moving freely. They are often recommended for people who have shoulder pain. Phase 1 exercises When you are able, do this exercise 5-6 days per week, or as told by your health care provider. Work toward doing 2 sets of 10 swings. Pendulum Exercise How To Do This Exercise Lying Down Lie face-down on a bed with your abdomen close to the side of the bed. Let your arm hang over the side of the bed. Relax your shoulder, arm, and hand. Slowly and gently swing your arm forward and back. Do not use your neck muscles to swing your arm. They should be relaxed. If you are struggling to swing your arm, have someone gently swing it for you. When you do this exercise for the first time, swing your arm at a 15 degree angle for 15 seconds, or swing your arm 10 times. As pain lessens over time, increase the angle of the swing to 30-45 degrees. Repeat steps 1-4 with the other arm.  How To Do This Exercise While Standing Stand next to a sturdy chair or table and hold on to it with your hand. Bend forward at the waist. Bend your knees slightly. Relax your other arm and let it hang limp. Relax the shoulder blade of the arm that is hanging and let it drop. While keeping your shoulder relaxed, use body motion to swing your arm in small circles. The first time you do this exercise, swing your arm for about 30 seconds or 10 times. When you do it next time, swing your arm for a little longer. Stand up tall and relax. Repeat steps 1-7, this time changing the direction of the circles. Repeat steps 1-8 with the other arm.  Phase 2 exercises Do these exercises 3-4 times per day on 5-6 days per week or as told by your health care provider. Work toward holding the stretch for 20 seconds. Stretching Exercise 1 Lift your arm straight out in front of you. Bend your arm 90 degrees at the elbow (right angle) so your  forearm goes across your body and looks like the letter "L." Use your other arm to gently pull the elbow forward and across your body. Repeat steps 1-3 with the other arm. Stretching Exercise 2 You will need a towel or rope for this exercise. Bend one arm behind your back with the palm facing outward. Hold a towel with your other hand. Reach the arm that holds the towel above your head, and bend that arm at the elbow. Your wrist should be behind your neck. Use your free hand to grab the free end of the towel. With the higher hand, gently pull the towel up behind you. With the lower hand, pull the towel down behind you. Repeat steps 1-6 with the other arm.  Phase 3 exercises Do each of these exercises at four different times of day (sessions) every day or as told by your health care provider. To begin with, repeat each exercise 5 times (repetitions). Work toward doing 3 sets of 12 repetitions or as told by your health care provider. Strengthening Exercise 1 You will need a light weight for this activity. As you grow stronger, you may use a heavier weight. Standing with a weight in your hand, lift your arm straight out to the side until it is at the same height as your shoulder. Bend your arm at 90 degrees so that your fingers  are pointing to the ceiling. Slowly raise your hand until your arm is straight up in the air. Repeat steps 1-3 with the other arm.  Strengthening Exercise 2 You will need a light weight for this activity. As you grow stronger, you may use a heavier weight. Standing with a weight in your hand, gradually move your straight arm in an arc, starting at your side, then out in front of you, then straight up over your head. Gradually move your other arm in an arc, starting at your side, then out in front of you, then straight up over your head. Repeat steps 1-2 with the other arm.  Strengthening Exercise 3 You will need an elastic band for this activity. As you grow  stronger, gradually increase the size of the bands or increase the number of bands that you use at one time. While standing, hold an elastic band in one hand and raise that arm up in the air. With your other hand, pull down the band until that hand is by your side. Repeat steps 1-2 with the other arm.  This information is not intended to replace advice given to you by your health care provider. Make sure you discuss any questions you have with your health care provider. Document Released: 11/06/2002 Document Revised: 10/04/2015 Document Reviewed: 02/03/2014 Elsevier Interactive Patient Education  2018 [place] Cuff Tendinitis Rotator cuff tendinitis is inflammation of the tough, cord-like bands that connect muscle to bone (tendons) in the rotator cuff. The rotator cuff includes all of the muscles and tendons that connect the arm to the shoulder. The rotator cuff holds the head of the upper arm bone (humerus) in the cup (fossa) of the shoulder blade (scapula). This condition can lead to a long-lasting (chronic) tear. The tear may be partial or complete. What are the causes? This condition is usually caused by overusing the rotator cuff. What increases the risk? This condition is more likely to develop in athletes and workers who frequently use their shoulder or reach over their heads. This can include activities such as:  Tennis.  Baseball or softball.  Swimming.  Construction work.  Painting.  What are the signs or symptoms? Symptoms of this condition include:  Pain spreading (radiating) from the shoulder to the upper arm.  Swelling and tenderness in front of the shoulder.  Pain when reaching, pulling, or lifting the arm above the head.  Pain when lowering the arm from above the head.  Minor pain in the shoulder when resting.  Increased pain in the shoulder at night.  Difficulty placing the arm behind the back.  How is this diagnosed? This condition is  diagnosed with a medical history and physical exam. Tests may also be done, including:  X-rays.  MRI.  Ultrasounds.  CT or MR arthrogram. During this test, a contrast material is injected and then images are taken.  How is this treated? Treatment for this condition depends on the severity of the condition. In less severe cases, treatment may include:  Rest. This may be done with a sling that holds the shoulder still (immobilization). Your health care provider may also recommend avoiding activities that involve lifting your arm over your head.  Icing the shoulder.  Anti-inflammatory medicines, such as aspirin or ibuprofen.  In more severe cases, treatment may include:  Physical therapy.  Steroid injections.  Surgery.  Follow these instructions at home: If you have a sling:  Wear the sling as told by your health care provider. Remove it  only as told by your health care provider.  Loosen the sling if your fingers tingle, become numb, or turn cold and blue.  Keep the sling clean.  If the sling is not waterproof, do not let it get wet. Remove it, if allowed, or cover it with a watertight covering when you take a bath or shower. Managing pain, stiffness, and swelling  If directed, put ice on the injured area. ? If you have a removable sling, remove it as told by your health care provider. ? Put ice in a plastic bag. ? Place a towel between your skin and the bag. ? Leave the ice on for 20 minutes, 2-3 times a day.  Move your fingers often to avoid stiffness and to lessen swelling.  Raise (elevate) the injured area above the level of your heart while you are lying down.  Find a comfortable sleeping position or sleep on a recliner, if available. Driving  Do not drive or use heavy machinery while taking prescription pain medicine.  Ask your health care provider when it is safe to drive if you have a sling on your arm. Activity  Rest your shoulder as told by your health  care provider.  Return to your normal activities as told by your health care provider. Ask your health care provider what activities are safe for you.  Do any exercises or stretches as told by your health care provider.  If you do repetitive overhead tasks, take small breaks in between and include stretching exercises as told by your health care provider. General instructions  Do not use any products that contain nicotine or tobacco, such as cigarettes and e-cigarettes. These can delay healing. If you need help quitting, ask your health care provider.  Take over-the-counter and prescription medicines only as told by your health care provider.  Keep all follow-up visits as told by your health care provider. This is important. Contact a health care provider if:  Your pain gets worse.  You have new pain in your arm, hands, or fingers.  Your pain is not relieved with medicine or does not get better after 6 weeks of treatment.  You have cracking sensations when moving your shoulder in certain directions.  You hear a snapping sound after using your shoulder, followed by severe pain and weakness. Get help right away if:  Your arm, hand, or fingers are numb or tingling.  Your arm, hand, or fingers are swollen or painful or they turn white or blue. Summary  Rotator cuff tendinitis is inflammation of the tough, cord-like bands that connect muscle to bone (tendons) in the rotator cuff.  This condition is usually caused by overusing the rotator cuff, which includes all of the muscles and tendons that connect the arm to the shoulder.  This condition is more likely to develop in athletes and workers who frequently use their shoulder or reach over their heads.  Treatment generally includes rest, anti-inflammatory medicines, and icing. In some cases, physical therapy and steroid injections may be needed. In severe cases, surgery may be needed. This information is not intended to replace advice  given to you by your health care provider. Make sure you discuss any questions you have with your health care provider. Document Released: 04/30/2003 Document Revised: 01/25/2016 Document Reviewed: 01/25/2016 Elsevier Interactive Patient Education  2017 [employer].

## 2017-01-20 ENCOUNTER — Other Ambulatory Visit: Payer: Self-pay | Admitting: Family Medicine

## 2017-01-20 DIAGNOSIS — R1310 Dysphagia, unspecified: Secondary | ICD-10-CM

## 2017-01-20 DIAGNOSIS — I5033 Acute on chronic diastolic (congestive) heart failure: Secondary | ICD-10-CM

## 2017-01-20 DIAGNOSIS — I2510 Atherosclerotic heart disease of native coronary artery without angina pectoris: Secondary | ICD-10-CM

## 2017-01-20 DIAGNOSIS — R1319 Other dysphagia: Secondary | ICD-10-CM

## 2017-01-20 DIAGNOSIS — I252 Old myocardial infarction: Secondary | ICD-10-CM

## 2017-01-20 NOTE — Telephone Encounter (Signed)
Seen [phone]

## 2017-01-25 ENCOUNTER — Ambulatory Visit: Payer: Medicaid Other | Admitting: Nutrition

## 2017-01-31 ENCOUNTER — Encounter (HOSPITAL_COMMUNITY): Payer: Medicaid Other

## 2017-02-09 ENCOUNTER — Other Ambulatory Visit: Payer: Self-pay | Admitting: Family Medicine

## 2017-02-09 DIAGNOSIS — Z1231 Encounter for screening mammogram for malignant neoplasm of breast: Secondary | ICD-10-CM

## 2017-02-10 ENCOUNTER — Other Ambulatory Visit: Payer: Self-pay | Admitting: Family Medicine

## 2017-02-10 DIAGNOSIS — E1140 Type 2 diabetes mellitus with diabetic neuropathy, unspecified: Secondary | ICD-10-CM

## 2017-02-23 ENCOUNTER — Ambulatory Visit (HOSPITAL_COMMUNITY)
Admission: RE | Admit: 2017-02-23 | Discharge: 2017-02-23 | Disposition: A | Discharge status: Home / Self Care | Payer: Medicaid Other | Source: Ambulatory Visit | Attending: Family Medicine | Admitting: Family Medicine

## 2017-02-23 DIAGNOSIS — Z1231 Encounter for screening mammogram for malignant neoplasm of breast: Secondary | ICD-10-CM | POA: Diagnosis present

## 2017-03-17 ENCOUNTER — Other Ambulatory Visit: Payer: Self-pay

## 2017-03-17 ENCOUNTER — Telehealth: Payer: Self-pay | Admitting: Cardiovascular Disease

## 2017-03-17 DIAGNOSIS — I43 Cardiomyopathy in diseases classified elsewhere: Secondary | ICD-10-CM

## 2017-03-17 DIAGNOSIS — I2510 Atherosclerotic heart disease of native coronary artery without angina pectoris: Secondary | ICD-10-CM

## 2017-03-17 DIAGNOSIS — I110 Hypertensive heart disease with heart failure: Secondary | ICD-10-CM

## 2017-03-17 NOTE — Telephone Encounter (Signed)
Pre-cert Verification for the following procedure   Lexiscan scheduled for 03/29/17 at [facility]

## 2017-03-20 ENCOUNTER — Ambulatory Visit: Payer: Medicaid Other | Admitting: Cardiovascular Disease

## 2017-03-21 ENCOUNTER — Ambulatory Visit: Payer: Medicaid Other | Admitting: Gastroenterology

## 2017-03-21 ENCOUNTER — Encounter: Payer: Self-pay | Admitting: Gastroenterology

## 2017-03-21 VITALS — BP 153/93 | HR 80 | Temp 97.0°F | Ht 61.0 in | Wt 185.8 lb

## 2017-03-21 DIAGNOSIS — R1310 Dysphagia, unspecified: Secondary | ICD-10-CM

## 2017-03-21 DIAGNOSIS — Z1211 Encounter for screening for malignant neoplasm of colon: Secondary | ICD-10-CM

## 2017-03-21 NOTE — Progress Notes (Signed)
Primary Care Physician: Caren Macadam, MD  Primary Gastroenterologist:  Garfield Cornea, MD   Chief Complaint  Patient presents with  . Dysphagia    doing ok; f/u    HPI: Monica Carlson is a 52 y.o. female here for follow-up.  Last seen in October 2018.  In August 2018 she was hospitalized with acute on chronic heart failure, acute stroke.  GI was consulted for dysphagia and odynophagia.  Evaluated by speech therapy with bedside swallowing evaluation noting intact oropharyngeal swallowing phases, complaints of globus sensation.  Barium pill esophagram performed noting esophageal dysmotility but no stricture.  Patient was on Plavix and aspirin for at least 3 months per neurology recommendations.  When I saw her in October she denied dysphagia.  Screening colonoscopy delayed given need for further cardiology testing and recuperation from stroke.  She returns today for consideration of colonoscopy. Unfortunately she has not completed cardiac testing, per her social worker, it was rescheduled due to social worker family illness. She is scheduled for Lexiscan 03/29/17. Follow up with cardiology in 04/2017.   Overall she denies GI symptoms. No dysphagia. No heartburn. No abd pain, diarrhea, melena, brbpr. BM about every other day. Somewhat hard stool.   Current Outpatient Medications  Medication Sig Dispense Refill  . aspirin 325 MG EC tablet Take 1 tablet (325 mg total) by mouth daily.    Marland Kitchen atorvastatin (LIPITOR) 80 MG tablet TAKE ONE TABLET BY MOUTH DAILY 90 tablet 1  . carvedilol (COREG) 3.125 MG tablet TAKE ONE TABLET BY MOUTH TWICE DAILY WITH A MEAL 62 tablet 0  . clopidogrel (PLAVIX) 75 MG tablet TAKE ONE TABLET BY MOUTH EVERY DAY WITH BREAKFAST 90 tablet 1  . furosemide (LASIX) 40 MG tablet TAKE ONE TABLET BY MOUTH TWICE DAILY 180 tablet 1  . gabapentin (NEURONTIN) 300 MG capsule TAKE ONE CAPSULE BY MOUTH TWICE DAILY 62 capsule 0  . glimepiride (AMARYL) 4 MG tablet Take 1 tablet (4 mg  total) by mouth daily before breakfast. 60 tablet 2  . levothyroxine (SYNTHROID, LEVOTHROID) 75 MCG tablet Take 1 tablet (75 mcg total) by mouth daily. 90 tablet 0  . metFORMIN (GLUCOPHAGE) 1000 MG tablet TAKE 1 TABLET BY MOUTH TWICE DAILY 62 tablet 0  . nitroGLYCERIN (NITROSTAT) 0.4 MG SL tablet Place 1 tablet (0.4 mg total) under the tongue every 5 (five) minutes as needed for chest pain. 30 tablet 4  . pantoprazole (PROTONIX) 40 MG tablet TAKE ONE TABLET BY MOUTH DAILY 90 tablet 1  . sacubitril-valsartan (ENTRESTO) 24-26 MG Take 1 tablet by mouth 2 (two) times daily. 60 tablet 6   No current facility-administered medications for this visit.     Allergies as of 03/21/2017  . (No Known Allergies)   Past Medical History:  Diagnosis Date  . Cervical cancer ([place])    cryotherapy and colposcopy/[place], [place]; 1991  . CHF (congestive heart failure) ([place]) 07-2014  . Diabetes mellitus without complication ([employer]) 7169  . High cholesterol   . Hypertension   . MI (myocardial infarction) ([place])    2004  . Stroke [facility]) 09/2014, 2013   Past Surgical History:  Procedure Laterality Date  . CHOLECYSTECTOMY  1992  . CORONARY STENT PLACEMENT  2004  . TUBAL LIGATION     Family History  Problem Relation Age of Onset  . Migraines Mother   . Heart disease Father   . Hypertension Father   . Diabetes Father   . Migraines Maternal Grandfather   . Heart  attack Maternal Grandfather   . Colon cancer Neg Hx   . Colon polyps Neg Hx    Social History   Socioeconomic History  . Marital status: Legally Separated    Spouse name: None  . Number of children: None  . Years of education: None  . Highest education level: None  Social Needs  . Financial resource strain: None  . Food insecurity - worry: None  . Food insecurity - inability: None  . Transportation needs - medical: None  . Transportation needs - non-medical: None  Occupational History  . None  Tobacco Use  . Smoking status: Never  Smoker  . Smokeless tobacco: Never Used  Substance and Sexual Activity  . Alcohol use: No  . Drug use: No  . Sexual activity: Yes    Birth control/protection: None, Surgical  Other Topics Concern  . None  Social History Narrative   Lives alone.    Has some friends.    Neighbors.     ROS:  General: Negative for anorexia, weight loss, fever, chills, fatigue, weakness. ENT: Negative for hoarseness, difficulty swallowing , nasal congestion. CV: Negative for chest pain, angina, palpitations, dyspnea on exertion, peripheral edema.  Respiratory: Negative for dyspnea at rest, dyspnea on exertion, cough, sputum, wheezing.  GI: See history of present illness. GU:  Negative for dysuria, hematuria, urinary incontinence, urinary frequency, nocturnal urination.  Endo: Negative for unusual weight change.    Physical Examination:   BP (!) 153/93   Pulse 80   Temp (!) 97 F (36.1 C) (Oral)   Ht 5\' 1"  (1.549 m)   Wt 185 lb 12.8 oz (84.3 kg)   BMI 35.11 kg/m   General: Well-nourished, well-developed in no acute distress.  Eyes: No icterus. Mouth: Oropharyngeal mucosa moist and pink , no lesions erythema or exudate. Lungs: Clear to auscultation bilaterally.  Heart: Regular rate and rhythm, no murmurs rubs or gallops.  Abdomen: Bowel sounds are normal, nontender, nondistended, no hepatosplenomegaly or masses, no abdominal bruits or hernia , no rebound or guarding.   Extremities: No lower extremity edema. No clubbing or deformities. Neuro: Alert and oriented x 4   Skin: Warm and dry, no jaundice.   Psych: Alert and cooperative, normal mood and affect.  Labs:  Lab Results  Component Value Date   CREATININE 0.95 01/16/2017   BUN 28 (H) 01/16/2017   NA 139 01/16/2017   K 3.9 01/16/2017   CL 97 (L) 01/16/2017   CO2 34 (H) 01/16/2017   Lab Results  Component Value Date   WBC 9.7 10/11/2016   HGB 13.9 10/11/2016   HCT 42.6 10/11/2016   MCV 94.7 10/11/2016   PLT 244 10/11/2016    Lab Results  Component Value Date   ALT 15 01/16/2017   AST 15 01/16/2017   ALKPHOS 55 10/07/2016   BILITOT 0.6 01/16/2017    Imaging Studies: Mm Screening Breast Tomo Bilateral  Result Date: 02/23/2017 CLINICAL DATA:  Screening. EXAM: 2D DIGITAL SCREENING BILATERAL MAMMOGRAM WITH 3D TOMO WITH CAD COMPARISON:  Previous exam(s). ACR Breast Density Category b: There are scattered areas of fibroglandular density. FINDINGS: There are no findings suspicious for malignancy. Images were processed with CAD. IMPRESSION: No mammographic evidence of malignancy. A result letter of this screening mammogram will be mailed directly to the patient. RECOMMENDATION: Screening mammogram in one year. (Code:SM-B-01Y) BI-RADS CATEGORY  1: Negative. Electronically Signed   By: Lajean Manes M.D.   On: 02/23/2017 13:32

## 2017-03-21 NOTE — Patient Instructions (Signed)
1. I will look at your heart test results coming up prior to scheduling your elective screening colonoscopy. Further recommendations to follow.

## 2017-03-21 NOTE — Progress Notes (Signed)
Please have patient start Colace 100mg  once to twice daily. I'm not sure if she has her meds packaged by [facility], please check.

## 2017-03-21 NOTE — Assessment & Plan Note (Signed)
Denies dysphagia. No alarming ugi symptoms. No further work up needed at this time.

## 2017-03-21 NOTE — Assessment & Plan Note (Addendum)
No prior colonoscopy. Discussed bowel preparation at length with patient. Discussed I would like for her to complete cardiac testing prior to elective screening colonoscopy. Once cardiac testing complete, consider screening colonoscopy. I will review results as available. Ultimately she may require follow up with cardiology in 04/2017 prior to colonoscopy, to be determined by results.   For mild constipation, take colace 1 to 2 daily.

## 2017-03-22 ENCOUNTER — Other Ambulatory Visit: Payer: Self-pay | Admitting: Family Medicine

## 2017-03-22 DIAGNOSIS — E039 Hypothyroidism, unspecified: Secondary | ICD-10-CM

## 2017-03-22 DIAGNOSIS — E1140 Type 2 diabetes mellitus with diabetic neuropathy, unspecified: Secondary | ICD-10-CM

## 2017-03-22 NOTE — Progress Notes (Signed)
Pt notified. Pt gets her medication packaged by [facility].

## 2017-03-22 NOTE — Progress Notes (Signed)
Lmom, waiting on a return call.  

## 2017-03-29 ENCOUNTER — Encounter (HOSPITAL_COMMUNITY)
Admission: RE | Admit: 2017-03-29 | Discharge: 2017-03-29 | Disposition: A | Discharge status: Home / Self Care | Payer: Medicaid Other | Source: Ambulatory Visit | Attending: Cardiovascular Disease | Admitting: Cardiovascular Disease

## 2017-03-29 ENCOUNTER — Encounter (HOSPITAL_COMMUNITY): Payer: Self-pay

## 2017-03-29 ENCOUNTER — Ambulatory Visit (HOSPITAL_COMMUNITY)
Admission: RE | Admit: 2017-03-29 | Discharge: 2017-03-29 | Disposition: A | Discharge status: Home / Self Care | Payer: Medicaid Other | Source: Ambulatory Visit | Attending: Cardiovascular Disease | Admitting: Cardiovascular Disease

## 2017-03-29 DIAGNOSIS — R9439 Abnormal result of other cardiovascular function study: Secondary | ICD-10-CM | POA: Insufficient documentation

## 2017-03-29 DIAGNOSIS — I110 Hypertensive heart disease with heart failure: Secondary | ICD-10-CM | POA: Diagnosis not present

## 2017-03-29 DIAGNOSIS — I43 Cardiomyopathy in diseases classified elsewhere: Secondary | ICD-10-CM | POA: Diagnosis not present

## 2017-03-29 DIAGNOSIS — I509 Heart failure, unspecified: Secondary | ICD-10-CM | POA: Insufficient documentation

## 2017-03-29 LAB — NM MYOCAR MULTI W/SPECT W/WALL MOTION / EF
CHL CUP RESTING HR STRESS: 71 {beats}/min
CSEPPHR: 101 {beats}/min
LVDIAVOL: 147 mL (ref 46–106)
LVSYSVOL: 89 mL
NUC STRESS TID: 0.94
RATE: 0.62
SDS: 1
SRS: 7
SSS: 8

## 2017-03-29 MED ORDER — REGADENOSON 0.4 MG/5ML IV SOLN
INTRAVENOUS | Status: AC
  Administered 2017-03-29: 0.4 mg via INTRAVENOUS
  Filled 2017-03-29: qty 5

## 2017-03-29 MED ORDER — SODIUM CHLORIDE 0.9% FLUSH
INTRAVENOUS | Status: AC
  Administered 2017-03-29: 10 mL via INTRAVENOUS
  Filled 2017-03-29: qty 10

## 2017-03-29 MED ORDER — TECHNETIUM TC 99M TETROFOSMIN IV KIT
30.0000 | PACK | Freq: Once | INTRAVENOUS | Status: AC | PRN
  Administered 2017-03-29: 31 via INTRAVENOUS

## 2017-03-29 MED ORDER — TECHNETIUM TC 99M TETROFOSMIN IV KIT
10.0000 | PACK | Freq: Once | INTRAVENOUS | Status: AC | PRN
  Administered 2017-03-29: 9.4 via INTRAVENOUS

## 2017-04-03 NOTE — Progress Notes (Signed)
Reviewed cardiac Lexiscan. Cardiology management with medications. No further cardiac w/u planned at this time.    Offer her colonoscopy (screening).  Day before TCS: glimepiride and metformin 1/2 dose.  AM of TCS: hold glimepiride and metformin.

## 2017-04-03 NOTE — Progress Notes (Signed)
Spoke with pt and she is fine with having screening colonoscopy done

## 2017-04-04 NOTE — Progress Notes (Signed)
Spoke with pt and she reports she is going to have her Social Worker Reyne Dumas call our office to get this scheduled for her. She reports he takes her to all of her appointments and schedules her appts for her. Will await call from him.

## 2017-04-05 ENCOUNTER — Encounter: Payer: Self-pay | Admitting: *Deleted

## 2017-04-05 ENCOUNTER — Other Ambulatory Visit: Payer: Self-pay | Admitting: *Deleted

## 2017-04-05 DIAGNOSIS — Z1211 Encounter for screening for malignant neoplasm of colon: Secondary | ICD-10-CM

## 2017-04-05 MED ORDER — CLENPIQ 10-3.5-12 MG-GM -GM/160ML PO SOLN: 1.0000 | Freq: Once | ORAL | 0 refills | Status: AC

## 2017-04-05 NOTE — Progress Notes (Signed)
LMTCB for Monica Carlson

## 2017-04-05 NOTE — Progress Notes (Signed)
Spoke with Reyne Dumas. Procedure scheduled for 05/30/17 at 12:00pm. Prep sent into the pharmacy. He asked for me to fax prep instructions to him at [phone]. I have also placed a copy of instructions in the mail.

## 2017-04-11 ENCOUNTER — Telehealth: Payer: Self-pay | Admitting: *Deleted

## 2017-04-11 NOTE — Telephone Encounter (Signed)
Notes recorded by Laurine Blazer, LPN on 0/76/8088 at 11:03 PM EST Patient notified. Copy to pmd. Follow up scheduled for 05/01/2017 with Dr. Bronson Ing.   ------  Notes recorded by Laurine Blazer, LPN on 1/59/4585 at 9:29 AM EST 2nd attempt to notify of results. (lmtcb) ------  Notes recorded by Laurine Blazer, LPN on 03/27/4626 at 6:38 PM EST Left message to return call.  ------  Notes recorded by Herminio Commons, MD on 03/29/2017 at 4:29 PM EST Evidence of prior MI but no new blockages. I will manage with medications.

## 2017-04-13 ENCOUNTER — Other Ambulatory Visit: Payer: Self-pay | Admitting: Family Medicine

## 2017-04-13 DIAGNOSIS — E1140 Type 2 diabetes mellitus with diabetic neuropathy, unspecified: Secondary | ICD-10-CM

## 2017-04-18 ENCOUNTER — Other Ambulatory Visit: Payer: Self-pay | Admitting: Family Medicine

## 2017-04-18 DIAGNOSIS — E1140 Type 2 diabetes mellitus with diabetic neuropathy, unspecified: Secondary | ICD-10-CM

## 2017-04-20 ENCOUNTER — Other Ambulatory Visit: Payer: Self-pay

## 2017-04-20 ENCOUNTER — Encounter: Payer: Self-pay | Admitting: Family Medicine

## 2017-04-20 ENCOUNTER — Ambulatory Visit (INDEPENDENT_AMBULATORY_CARE_PROVIDER_SITE_OTHER): Payer: Medicaid Other | Admitting: Family Medicine

## 2017-04-20 VITALS — BP 144/82 | HR 65 | Temp 97.5°F | Resp 16 | Ht 61.0 in | Wt 187.2 lb

## 2017-04-20 DIAGNOSIS — Z23 Encounter for immunization: Secondary | ICD-10-CM | POA: Diagnosis not present

## 2017-04-20 DIAGNOSIS — R231 Pallor: Secondary | ICD-10-CM | POA: Diagnosis not present

## 2017-04-20 DIAGNOSIS — F321 Major depressive disorder, single episode, moderate: Secondary | ICD-10-CM

## 2017-04-20 MED ORDER — CITALOPRAM HYDROBROMIDE 20 MG PO TABS: 20.0000 mg | ORAL_TABLET | Freq: Every day | ORAL | 1 refills | Status: DC

## 2017-04-20 NOTE — Progress Notes (Signed)
Patient ID: Alma Mohiuddin, female    DOB: [birth year], 52 y.o.   MRN: [id number]  Chief Complaint  Patient presents with  . Follow-up    Allergies Patient has no known allergies.  Subjective:   Ayona Yniguez is a 52 y.o. female who presents to [facility] today.  HPI Ms. Dillie presents today for a follow-up visit.  She is accompanied today by Reyne Dumas, community paramedic worker.  He brought her to her visit today.  She did not come in and get her blood work prior to her visit as instructed.  We are following up today to discuss her cholesterol, thyroid function, blood sugars, and blood pressure.  She reports that she did not take her blood pressure medications today.  She did not get up in time before he came to pick her up.  She reports that she was sleeping when he arrived and did not have a chance to take her medications.  She reports that she has been sleeping more than normal.  She has been feeling down and sad for quite some time now.  She reports that she feels lonely and sad that she is not in a relationship.  She reports that she does have a boyfriend who is in the [employer] and is in another state.  She reports they text but they rarely get to talk and she never sees him.  She reports that she does not like being alone and often feels scared and anxious.  She has never had a break in or been robbed.  She lives in rent controlled housing in a two-story apartment.  Her bedroom is on the second floor.  She denies any abuse, verbal, physical, or sexual.  She reports that she is felt down there for some time.  She would like to get out more and do things but does not have a car.  She does not drive.  She watches TV.  Her appetite is fair.  Energy level can be low or normal.  Concentration is poor.  She has never been on any medication for mood in the past.  She reports that over the past several months she has been sleeping a lot more out than usual.  Patient denies any hypoglycemic  episodes.  She does not check her sugars at home.  She has been taking her medications as directed.  Denies any chest pain, shortness of breath, or swelling in her extremities.  Is eating well.  Reports that she has enough food.  Goes shopping once a month for food after she gets her food stamps.  She does have someone who will go out several times in the month and get her bread milk or pair chewable items if needed.  She occasionally gets out of the house with friends or relatives to go to [place] or other places.  She denies any suicidal or homicidal ideations.  Denies any auditory or visual hallucinations.    Past Medical History:  Diagnosis Date  . Cervical cancer ([place])    cryotherapy and colposcopy/[place], [place]; 1991  . CHF (congestive heart failure) ([place]) 07-2014  . Diabetes mellitus without complication ([place]) 7416  . High cholesterol   . Hypertension   . MI (myocardial infarction) ([place])    2004  . Stroke [facility]) 09/2014, 2013    Past Surgical History:  Procedure Laterality Date  . CHOLECYSTECTOMY  1992  . CORONARY STENT PLACEMENT  2004  . TUBAL LIGATION      Family  History  Problem Relation Age of Onset  . Migraines Mother   . Heart disease Father   . Hypertension Father   . Diabetes Father   . Migraines Maternal Grandfather   . Heart attack Maternal Grandfather   . Colon cancer Neg Hx   . Colon polyps Neg Hx      Social History   Socioeconomic History  . Marital status: Legally Separated    Spouse name: None  . Number of children: None  . Years of education: None  . Highest education level: None  Social Needs  . Financial resource strain: None  . Food insecurity - worry: None  . Food insecurity - inability: None  . Transportation needs - medical: None  . Transportation needs - non-medical: None  Occupational History  . None  Tobacco Use  . Smoking status: Never Smoker  . Smokeless tobacco: Never Used  Substance and Sexual Activity  . Alcohol use: No   . Drug use: No  . Sexual activity: Yes    Birth control/protection: None, Surgical  Other Topics Concern  . None  Social History Narrative   Lives alone.    Has some friends.    Neighbors.    Current Outpatient Medications on File Prior to Visit  Medication Sig Dispense Refill  . aspirin 325 MG EC tablet Take 1 tablet (325 mg total) by mouth daily.    Marland Kitchen atorvastatin (LIPITOR) 80 MG tablet TAKE ONE TABLET BY MOUTH DAILY 90 tablet 1  . carvedilol (COREG) 3.125 MG tablet TAKE ONE TABLET BY MOUTH TWICE DAILY WITH A MEAL 62 tablet 0  . clopidogrel (PLAVIX) 75 MG tablet TAKE ONE TABLET BY MOUTH EVERY DAY WITH BREAKFAST 90 tablet 1  . furosemide (LASIX) 40 MG tablet TAKE ONE TABLET BY MOUTH TWICE DAILY 180 tablet 1  . gabapentin (NEURONTIN) 300 MG capsule TAKE ONE CAPSULE BY MOUTH TWICE DAILY 62 capsule 0  . glimepiride (AMARYL) 4 MG tablet TAKE ONE TABLET BY MOUTH DAILY BEFORE breakfast 60 tablet 2  . levothyroxine (SYNTHROID, LEVOTHROID) 75 MCG tablet TAKE ONE TABLET BY MOUTH EVERY DAY 90 tablet 0  . metFORMIN (GLUCOPHAGE) 1000 MG tablet TAKE 1 TABLET BY MOUTH TWICE DAILY 62 tablet 0  . nitroGLYCERIN (NITROSTAT) 0.4 MG SL tablet Place 1 tablet (0.4 mg total) under the tongue every 5 (five) minutes as needed for chest pain. 30 tablet 4  . pantoprazole (PROTONIX) 40 MG tablet TAKE ONE TABLET BY MOUTH DAILY 90 tablet 1  . sacubitril-valsartan (ENTRESTO) 24-26 MG Take 1 tablet by mouth 2 (two) times daily. 60 tablet 6   No current facility-administered medications on file prior to visit.     Review of Systems  Constitutional: Negative for appetite change, diaphoresis, fatigue, fever and unexpected weight change.  HENT: Negative for mouth sores and trouble swallowing.   Respiratory: Negative for cough, chest tightness, shortness of breath and wheezing.   Cardiovascular: Negative for chest pain and leg swelling.  Gastrointestinal: Negative for abdominal pain, constipation, diarrhea and  nausea.  Genitourinary: Negative for dysuria and urgency.  Musculoskeletal: Negative for arthralgias and myalgias.  Skin: Negative for rash.  Neurological: Negative for dizziness, syncope, weakness, light-headedness and numbness.  Hematological: Negative for adenopathy. Does not bruise/bleed easily.  Psychiatric/Behavioral: Positive for dysphoric mood. Negative for agitation, behavioral problems, confusion, self-injury, sleep disturbance and suicidal ideas. The patient is nervous/anxious.      Objective:   BP (!) 144/82 (BP Location: Left Arm, Patient Position: Sitting, Cuff Size: Normal)  Pulse 65   Temp (!) 97.5 F (36.4 C) (Temporal)   Resp 16   Ht 5\' 1"  (1.549 m)   Wt 187 lb 4 oz (84.9 kg)   SpO2 94%   BMI 35.38 kg/m   Physical Exam  Constitutional: She is oriented to person, place, and time. She appears well-developed and well-nourished. No distress.  HENT:  Head: Normocephalic and atraumatic.  Eyes: Pupils are equal, round, and reactive to light.  Neck: Normal range of motion. Neck supple. No thyromegaly present.  Cardiovascular: Normal rate, regular rhythm and normal heart sounds.  Pulmonary/Chest: Effort normal and breath sounds normal. No respiratory distress.  Musculoskeletal: She exhibits no edema.  Neurological: She is alert and oriented to person, place, and time. No cranial nerve deficit.  Skin: Skin is warm and dry.  Psychiatric: Her behavior is normal. Judgment and thought content normal. Her affect is blunt. Her affect is not angry and not inappropriate. Her speech is delayed. Cognition and memory are normal. She exhibits a depressed mood. She expresses no homicidal and no suicidal ideation. She expresses no suicidal plans and no homicidal plans.  Nursing note and vitals reviewed. Appears somewhat pale today.  She is not sweaty.  She has no tremors witnessed on exam..  Denies any hypoglycemic symptoms. Depression screen [facility] 2/9 04/20/2017 04/20/2017 11/15/2016    Decreased Interest 2 0 0  Down, Depressed, Hopeless 2 0 0  PHQ - 2 Score 4 0 0  Altered sleeping 3 - -  Change in appetite 1 - -  Feeling bad or failure about yourself  1 - -  Trouble concentrating 3 - -  Moving slowly or fidgety/restless 3 - -  Suicidal thoughts 0 - -  PHQ-9 Score 15 - -  Difficult doing work/chores Somewhat difficult - -     Assessment and Plan  1. Depression, major, single episode, moderate (HCC) Suicide risks evaluated and documented in note if present or in the area below.  Patient does not have/denies the following risks: previous suicide attempts, family history of suicide, access to lethal means, prior history of psychiatric disorder, history of alcohol or substance abuse disorder, recent loss of a loved one, or severe hopelessness. Patient denies access to firearms or if present will have removed from home/access.   Patient has protective factors of family and community support.  Patient reports that family believes is behaving rationally. Patient displays problem solving skills.   Patient specifically denies suicide ideation. Patient has access/information to healthcare contacts if situation or mood changes where patient is a risk to self or others or mood becomes unstable.   During the encounter, the patient had good eye contact and firm handshake regarding safety contract and agreement to seek help if mood worsens and not to harm self.   Patient understands the treatment plan and is in agreement. Agrees to keep follow up and call prior or return to clinic if needed.   Patient is in agreement to start medication at this time her mood.Patient counseled in detail regarding the risks of medication. Told to call or return to clinic if develop any worrisome signs or symptoms. Patient voiced understanding.   - citalopram (CELEXA) 20 MG tablet; Take 1 tablet (20 mg total) by mouth daily.  Dispense: 30 tablet; Refill: 1  2. Immunization due Vaccine given.  -  Tdap vaccine greater than or equal to 7yo IM  3. Pallor Does not look pale today.  She had not eaten in a just woken up.  Will check CBC to make sure she is not anemic. - CBC with Differential  Patient will go to lab today and get her other lab work performed.  She will follow-up in 2-3 weeks or sooner if needed.  She will call with any questions or concerns.  Discussed with community worker who brought her today they will check into programs which patient could be involved into get her out in the community and have more interaction with people.  I do believe that she is lonely and her depression is worsened due to this fact.  Discussed consideration of the leaf program.  Financial planner will check into whether they provide transportation for the patient for this program.  Patient reports that she would be interested in this.  Office visit was 30 minutes.  Greater than 50% of office visit was spent counseling and coordinating care. Return in about 3 weeks (around 05/11/2017) for follow up. Caren Macadam, MD 04/20/2017

## 2017-04-21 LAB — CBC WITH DIFFERENTIAL/PLATELET
BASOS ABS: 52 {cells}/uL (ref 0–200)
Basophils Relative: 0.5 %
Eosinophils Absolute: 175 cells/uL (ref 15–500)
Eosinophils Relative: 1.7 %
HEMATOCRIT: 32.2 % — AB (ref 35.0–45.0)
Hemoglobin: 11.5 g/dL — ABNORMAL LOW (ref 11.7–15.5)
LYMPHS ABS: 2946 {cells}/uL (ref 850–3900)
MCH: 33.1 pg — AB (ref 27.0–33.0)
MCHC: 35.7 g/dL (ref 32.0–36.0)
MCV: 92.8 fL (ref 80.0–100.0)
MPV: 10.9 fL (ref 7.5–12.5)
Monocytes Relative: 5.6 %
NEUTROS PCT: 63.6 %
Neutro Abs: 6551 cells/uL (ref 1500–7800)
Platelets: 367 10*3/uL (ref 140–400)
RBC: 3.47 10*6/uL — ABNORMAL LOW (ref 3.80–5.10)
RDW: 12.6 % (ref 11.0–15.0)
Total Lymphocyte: 28.6 %
WBC: 10.3 10*3/uL (ref 3.8–10.8)
WBCMIX: 577 {cells}/uL (ref 200–950)

## 2017-04-21 LAB — HEPATIC FUNCTION PANEL
AG Ratio: 1.3 (calc) (ref 1.0–2.5)
ALT: 12 U/L (ref 6–29)
AST: 13 U/L (ref 10–35)
Albumin: 4.3 g/dL (ref 3.6–5.1)
Alkaline phosphatase (APISO): 84 U/L (ref 33–130)
BILIRUBIN DIRECT: 0.1 mg/dL (ref 0.0–0.2)
BILIRUBIN INDIRECT: 0.4 mg/dL (ref 0.2–1.2)
Globulin: 3.3 g/dL (calc) (ref 1.9–3.7)
Total Bilirubin: 0.5 mg/dL (ref 0.2–1.2)
Total Protein: 7.6 g/dL (ref 6.1–8.1)

## 2017-04-21 LAB — LIPID PANEL
CHOLESTEROL: 231 mg/dL — AB (ref <200)
HDL: 37 mg/dL — ABNORMAL LOW (ref >50)
LDL Cholesterol (Calc): 148 mg/dL (calc) — ABNORMAL HIGH
Non-HDL Cholesterol (Calc): 194 mg/dL (calc) — ABNORMAL HIGH (ref <130)
Total CHOL/HDL Ratio: 6.2 (calc) — ABNORMAL HIGH (ref <5.0)
Triglycerides: 301 mg/dL — ABNORMAL HIGH (ref <150)

## 2017-04-21 LAB — BASIC METABOLIC PANEL
BUN/Creatinine Ratio: 19 (calc) (ref 6–22)
BUN: 38 mg/dL — AB (ref 7–25)
CO2: 35 mmol/L — ABNORMAL HIGH (ref 20–32)
CREATININE: 1.98 mg/dL — AB (ref 0.50–1.05)
Calcium: 10.2 mg/dL (ref 8.6–10.4)
Chloride: 99 mmol/L (ref 98–110)
GLUCOSE: 74 mg/dL (ref 65–139)
POTASSIUM: 4.9 mmol/L (ref 3.5–5.3)
Sodium: 146 mmol/L (ref 135–146)

## 2017-04-21 LAB — HEMOGLOBIN A1C
EAG (MMOL/L): 6.5 (calc)
HEMOGLOBIN A1C: 5.7 %{Hb} — AB (ref <5.7)
Mean Plasma Glucose: 117 (calc)

## 2017-04-21 LAB — TSH: TSH: 3.08 m[IU]/L

## 2017-04-26 ENCOUNTER — Telehealth: Payer: Self-pay | Admitting: Family Medicine

## 2017-04-26 DIAGNOSIS — R7989 Other specified abnormal findings of blood chemistry: Secondary | ICD-10-CM

## 2017-04-26 DIAGNOSIS — E113511 Type 2 diabetes mellitus with proliferative diabetic retinopathy with macular edema, right eye: Secondary | ICD-10-CM | POA: Diagnosis not present

## 2017-04-26 DIAGNOSIS — D649 Anemia, unspecified: Secondary | ICD-10-CM

## 2017-04-26 NOTE — Telephone Encounter (Signed)
Please call and advise patient that her hemoglobin A1c is much improved.  Her hemoglobin A1c is down to 5.7%.  She should continue the metformin twice a day as directed.  However, I would like for her to stop the Actos 4 mg p.o. daily.  Please continue monitoring her diet and eating healthy.  Please advise her that her hemoglobin had decreased and her kidney function has increased.  I would like for her to return to the lab for some additional testing to help sort this out.  We might need to call patient's community health worker/Mr. Yow and let him know the patient will need transportation to come in and get this blood work within the next several days.

## 2017-04-27 NOTE — Telephone Encounter (Signed)
Left message per DPR 

## 2017-05-01 ENCOUNTER — Ambulatory Visit: Payer: Medicaid Other | Admitting: Cardiovascular Disease

## 2017-05-09 ENCOUNTER — Other Ambulatory Visit: Payer: Self-pay | Admitting: Family Medicine

## 2017-05-09 DIAGNOSIS — E039 Hypothyroidism, unspecified: Secondary | ICD-10-CM

## 2017-05-09 DIAGNOSIS — E1140 Type 2 diabetes mellitus with diabetic neuropathy, unspecified: Secondary | ICD-10-CM

## 2017-05-19 ENCOUNTER — Other Ambulatory Visit: Payer: Self-pay | Admitting: Family Medicine

## 2017-05-19 DIAGNOSIS — Z79899 Other long term (current) drug therapy: Secondary | ICD-10-CM | POA: Diagnosis not present

## 2017-05-19 DIAGNOSIS — Z7984 Long term (current) use of oral hypoglycemic drugs: Secondary | ICD-10-CM | POA: Diagnosis not present

## 2017-05-19 DIAGNOSIS — E1140 Type 2 diabetes mellitus with diabetic neuropathy, unspecified: Secondary | ICD-10-CM

## 2017-05-19 DIAGNOSIS — E162 Hypoglycemia, unspecified: Secondary | ICD-10-CM | POA: Diagnosis not present

## 2017-05-19 DIAGNOSIS — Z7982 Long term (current) use of aspirin: Secondary | ICD-10-CM | POA: Diagnosis not present

## 2017-05-19 DIAGNOSIS — H54413A Blindness right eye category 3, normal vision left eye: Secondary | ICD-10-CM | POA: Diagnosis not present

## 2017-05-19 DIAGNOSIS — Z8249 Family history of ischemic heart disease and other diseases of the circulatory system: Secondary | ICD-10-CM | POA: Diagnosis not present

## 2017-05-19 DIAGNOSIS — H4311 Vitreous hemorrhage, right eye: Secondary | ICD-10-CM | POA: Diagnosis not present

## 2017-05-19 DIAGNOSIS — I252 Old myocardial infarction: Secondary | ICD-10-CM | POA: Diagnosis not present

## 2017-05-19 DIAGNOSIS — Z8541 Personal history of malignant neoplasm of cervix uteri: Secondary | ICD-10-CM | POA: Diagnosis not present

## 2017-05-19 DIAGNOSIS — I509 Heart failure, unspecified: Secondary | ICD-10-CM | POA: Diagnosis not present

## 2017-05-19 DIAGNOSIS — E119 Type 2 diabetes mellitus without complications: Secondary | ICD-10-CM | POA: Diagnosis not present

## 2017-05-19 DIAGNOSIS — I110 Hypertensive heart disease with heart failure: Secondary | ICD-10-CM | POA: Diagnosis not present

## 2017-05-22 ENCOUNTER — Encounter: Payer: Self-pay | Admitting: Cardiovascular Disease

## 2017-05-22 ENCOUNTER — Encounter: Payer: Self-pay | Admitting: *Deleted

## 2017-05-22 ENCOUNTER — Telehealth: Payer: Self-pay | Admitting: Cardiovascular Disease

## 2017-05-22 ENCOUNTER — Ambulatory Visit: Payer: Medicaid Other | Admitting: Cardiovascular Disease

## 2017-05-22 ENCOUNTER — Other Ambulatory Visit: Payer: Self-pay

## 2017-05-22 VITALS — BP 121/74 | HR 82 | Ht 61.0 in | Wt 190.0 lb

## 2017-05-22 DIAGNOSIS — E11649 Type 2 diabetes mellitus with hypoglycemia without coma: Secondary | ICD-10-CM | POA: Diagnosis not present

## 2017-05-22 DIAGNOSIS — E785 Hyperlipidemia, unspecified: Secondary | ICD-10-CM | POA: Diagnosis not present

## 2017-05-22 DIAGNOSIS — I519 Heart disease, unspecified: Secondary | ICD-10-CM | POA: Diagnosis not present

## 2017-05-22 DIAGNOSIS — Z8673 Personal history of transient ischemic attack (TIA), and cerebral infarction without residual deficits: Secondary | ICD-10-CM

## 2017-05-22 DIAGNOSIS — E113511 Type 2 diabetes mellitus with proliferative diabetic retinopathy with macular edema, right eye: Secondary | ICD-10-CM | POA: Diagnosis not present

## 2017-05-22 DIAGNOSIS — I10 Essential (primary) hypertension: Secondary | ICD-10-CM | POA: Diagnosis not present

## 2017-05-22 DIAGNOSIS — I25118 Atherosclerotic heart disease of native coronary artery with other forms of angina pectoris: Secondary | ICD-10-CM

## 2017-05-22 DIAGNOSIS — I5042 Chronic combined systolic (congestive) and diastolic (congestive) heart failure: Secondary | ICD-10-CM | POA: Diagnosis not present

## 2017-05-22 DIAGNOSIS — H35371 Puckering of macula, right eye: Secondary | ICD-10-CM | POA: Diagnosis not present

## 2017-05-22 DIAGNOSIS — I429 Cardiomyopathy, unspecified: Secondary | ICD-10-CM

## 2017-05-22 DIAGNOSIS — H538 Other visual disturbances: Secondary | ICD-10-CM | POA: Diagnosis not present

## 2017-05-22 NOTE — Progress Notes (Signed)
Please call and request ED vist from [facility]. She was seen there recently as documented in Dr. Court Joy note secondary to hypoglycemia. .hrn

## 2017-05-22 NOTE — Patient Instructions (Signed)

## 2017-05-22 NOTE — Telephone Encounter (Signed)
Echo in [facility] June 02, 2017

## 2017-05-22 NOTE — Progress Notes (Signed)
SUBJECTIVE: The patient presents for follow-up of cardiomyopathy with severe left ventricular dysfunction and coronary artery disease.  When I saw her while hospitalized in August 2018, left ventricular systolic function was severely reduced, LVEF 20%, with multiple wall motion abnormalities and grade 2 diastolic dysfunction.  She was hospitalized for acute combined systolic and diastolic heart failure.  She has a history of bare-metal stent to the RCA in 2004.  She underwent a low risk nuclear stress test in May 2017.  She then underwent a follow-up nuclear stress test on 03/29/17 which demonstrated a large defect of moderate severity present in the apical anterior and apex.  This appeared to be consistent with prior myocardial infarction.  The patient denies any symptoms of chest pain, palpitations, shortness of breath, lightheadedness, dizziness, leg swelling, orthopnea, PND, and syncope.  She is here with her Education officer, museum.  She told me she was evaluated at [facility] for vision loss last Friday.  She currently had a normal head CT.  She said her blood sugar was 28 and they gave her glucose.  They said she had kidney dysfunction.  I will have to request a copy of all documentation.   Review of Systems: As per "subjective", otherwise negative.  No Known Allergies  Current Outpatient Medications  Medication Sig Dispense Refill  . aspirin 325 MG EC tablet Take 1 tablet (325 mg total) by mouth daily.    Marland Kitchen atorvastatin (LIPITOR) 80 MG tablet TAKE ONE TABLET BY MOUTH DAILY (Patient taking differently: TAKE 80 MG BY MOUTH DAILY) 90 tablet 1  . carvedilol (COREG) 3.125 MG tablet TAKE ONE TABLET BY MOUTH TWICE DAILY WITH A MEAL (Patient taking differently: TAKE 3.125 MG BY MOUTH TWICE DAILY WITH A MEAL) 62 tablet 0  . citalopram (CELEXA) 20 MG tablet Take 1 tablet (20 mg total) by mouth daily. 30 tablet 1  . clopidogrel (PLAVIX) 75 MG tablet TAKE ONE TABLET BY MOUTH EVERY DAY WITH  BREAKFAST (Patient taking differently: TAKE 75 MG BY MOUTH EVERY DAY WITH BREAKFAST) 90 tablet 1  . furosemide (LASIX) 40 MG tablet TAKE ONE TABLET BY MOUTH TWICE DAILY (Patient taking differently: TAKE 40 MG BY MOUTH TWICE DAILY) 180 tablet 1  . gabapentin (NEURONTIN) 300 MG capsule TAKE ONE CAPSULE BY MOUTH TWICE DAILY (Patient taking differently: TAKE 300 MG BY MOUTH TWICE DAILY) 62 capsule 0  . glimepiride (AMARYL) 4 MG tablet TAKE ONE TABLET BY MOUTH DAILY BEFORE breakfast (Patient taking differently: TAKE 4 MG BY MOUTH DAILY BEFORE breakfast) 60 tablet 2  . levothyroxine (SYNTHROID, LEVOTHROID) 75 MCG tablet TAKE ONE TABLET BY MOUTH EVERY DAY (Patient taking differently: TAKE 75 MCG BY MOUTH EVERY DAY) 90 tablet 0  . metFORMIN (GLUCOPHAGE) 1000 MG tablet TAKE 1 TABLET BY MOUTH TWICE DAILY (Patient taking differently: TAKE 1000 MG BY MOUTH TWICE DAILY) 62 tablet 0  . nitroGLYCERIN (NITROSTAT) 0.4 MG SL tablet Place 1 tablet (0.4 mg total) under the tongue every 5 (five) minutes as needed for chest pain. 30 tablet 4  . pantoprazole (PROTONIX) 40 MG tablet TAKE ONE TABLET BY MOUTH DAILY (Patient taking differently: TAKE 40 MG BY MOUTH DAILY) 90 tablet 1  . sacubitril-valsartan (ENTRESTO) 24-26 MG Take 1 tablet by mouth 2 (two) times daily. 60 tablet 6   No current facility-administered medications for this visit.     Past Medical History:  Diagnosis Date  . Cervical cancer ([place])    cryotherapy and colposcopy/[place]; 1991  .  CHF (congestive heart failure) ([place]) 07-2014  . Diabetes mellitus without complication ([place]) 4008  . High cholesterol   . Hypertension   . MI (myocardial infarction) ([place])    2004  . Stroke [facility]) 09/2014, 2013    Past Surgical History:  Procedure Laterality Date  . CHOLECYSTECTOMY  1992  . CORONARY STENT PLACEMENT  2004  . TUBAL LIGATION      Social History   Socioeconomic History  . Marital status: Legally Separated    Spouse name: Not on file  .  Number of children: Not on file  . Years of education: Not on file  . Highest education level: Not on file  Occupational History  . Not on file  Social Needs  . Financial resource strain: Not on file  . Food insecurity:    Worry: Not on file    Inability: Not on file  . Transportation needs:    Medical: Not on file    Non-medical: Not on file  Tobacco Use  . Smoking status: Never Smoker  . Smokeless tobacco: Never Used  Substance and Sexual Activity  . Alcohol use: No  . Drug use: No  . Sexual activity: Yes    Birth control/protection: None, Surgical  Lifestyle  . Physical activity:    Days per week: Not on file    Minutes per session: Not on file  . Stress: Not on file  Relationships  . Social connections:    Talks on phone: Not on file    Gets together: Not on file    Attends religious service: Not on file    Active member of club or organization: Not on file    Attends meetings of clubs or organizations: Not on file    Relationship status: Not on file  . Intimate partner violence:    Fear of current or ex partner: Not on file    Emotionally abused: Not on file    Physically abused: Not on file    Forced sexual activity: Not on file  Other Topics Concern  . Not on file  Social History Narrative   Lives alone.    Has some friends.    Neighbors.      Vitals:   05/22/17 1525  BP: 121/74  Pulse: 82  SpO2: 98%  Weight: 190 lb (86.2 kg)  Height: 5\' 1"  (1.549 m)    Wt Readings from Last 3 Encounters:  05/22/17 190 lb (86.2 kg)  04/20/17 187 lb 4 oz (84.9 kg)  03/21/17 185 lb 12.8 oz (84.3 kg)     PHYSICAL EXAM General: NAD HEENT: Normal. Neck: No JVD, no thyromegaly. Lungs: Clear to auscultation bilaterally with normal respiratory effort. CV: Regular rate and rhythm, normal S1/S2, no S3/S4, no murmur. No pretibial or periankle edema.  No carotid bruit.   Abdomen: Soft, nontender, no distention.  Neurologic: Alert and oriented.  Psych: Normal  affect. Skin: Normal. Musculoskeletal: No gross deformities.    ECG: Most recent ECG reviewed.   Labs: Lab Results  Component Value Date/Time   K 4.9 04/20/2017 02:18 PM   BUN 38 (H) 04/20/2017 02:18 PM   CREATININE 1.98 (H) 04/20/2017 02:18 PM   ALT 12 04/20/2017 02:18 PM   TSH 3.08 04/20/2017 02:18 PM   HGB 11.5 (L) 04/20/2017 02:18 PM     Lipids: Lab Results  Component Value Date/Time   LDLCALC 148 (H) 04/20/2017 02:18 PM   CHOL 231 (H) 04/20/2017 02:18 PM   TRIG 301 (H) 04/20/2017 02:18  PM   HDL 37 (L) 04/20/2017 02:18 PM       ASSESSMENT AND PLAN: 1.  Chronic combined systolic and diastolic heart failure: Euvolemic and stable.  Currently on carvedilol 3.125 mg twice daily, Entresto 24/26 mg twice daily, and Lasix 40 mg twice daily.  Nuclear stress test reviewed above with no evidence of ischemia and presence of myocardial scar noted.  2.  Cardiomyopathy/severe left ventricular dysfunction: Nuclear stress test reviewed above with no evidence of ischemia and presence of myocardial scar noted. Continue carvedilol and Entresto.   I will repeat an echocardiogram.  If LVEF remains severely reduced, I will make a referral to EP to assess AICD candidacy.  3.  Coronary artery disease with history of bare-metal stent to the RCA in 2004: Stable ischemic heart disease.  Continue aspirin, carvedilol, and statin.  Nuclear stress test reviewed above with no evidence of ischemia.  4.  Bilateral CVA: On aspirin, Plavix, and statin.  5.  Chronic hypertension: Blood pressure is normal.  No changes to therapy.  6.  Hyperlipidemia: Continue Lipitor.     Disposition: Follow up 3 months   Kate Sable, M.D., F.A.C.C.

## 2017-05-23 ENCOUNTER — Other Ambulatory Visit: Payer: Self-pay | Admitting: Family Medicine

## 2017-05-23 DIAGNOSIS — E1140 Type 2 diabetes mellitus with diabetic neuropathy, unspecified: Secondary | ICD-10-CM

## 2017-05-23 NOTE — Progress Notes (Signed)
Done

## 2017-05-24 ENCOUNTER — Other Ambulatory Visit: Payer: Self-pay

## 2017-05-24 ENCOUNTER — Ambulatory Visit (INDEPENDENT_AMBULATORY_CARE_PROVIDER_SITE_OTHER): Payer: Medicare Other | Admitting: Family Medicine

## 2017-05-24 ENCOUNTER — Encounter: Payer: Self-pay | Admitting: Family Medicine

## 2017-05-24 VITALS — BP 118/70 | HR 82 | Temp 97.6°F | Resp 16 | Ht 61.0 in | Wt 189.8 lb

## 2017-05-24 DIAGNOSIS — E11649 Type 2 diabetes mellitus with hypoglycemia without coma: Secondary | ICD-10-CM

## 2017-05-24 DIAGNOSIS — I252 Old myocardial infarction: Secondary | ICD-10-CM | POA: Diagnosis not present

## 2017-05-24 DIAGNOSIS — I5021 Acute systolic (congestive) heart failure: Secondary | ICD-10-CM | POA: Diagnosis not present

## 2017-05-24 DIAGNOSIS — J302 Other seasonal allergic rhinitis: Secondary | ICD-10-CM

## 2017-05-24 DIAGNOSIS — E1122 Type 2 diabetes mellitus with diabetic chronic kidney disease: Secondary | ICD-10-CM

## 2017-05-24 DIAGNOSIS — R7989 Other specified abnormal findings of blood chemistry: Secondary | ICD-10-CM | POA: Diagnosis not present

## 2017-05-24 DIAGNOSIS — Z8673 Personal history of transient ischemic attack (TIA), and cerebral infarction without residual deficits: Secondary | ICD-10-CM | POA: Diagnosis not present

## 2017-05-24 DIAGNOSIS — E7800 Pure hypercholesterolemia, unspecified: Secondary | ICD-10-CM | POA: Diagnosis not present

## 2017-05-24 DIAGNOSIS — N182 Chronic kidney disease, stage 2 (mild): Secondary | ICD-10-CM | POA: Diagnosis not present

## 2017-05-24 DIAGNOSIS — D649 Anemia, unspecified: Secondary | ICD-10-CM | POA: Diagnosis not present

## 2017-05-24 MED ORDER — LORATADINE 10 MG PO TABS: 10.0000 mg | ORAL_TABLET | Freq: Every day | ORAL | 11 refills | Status: DC

## 2017-05-24 NOTE — Progress Notes (Signed)
Patient ID: Monica Carlson, female    DOB: 04/26/1965, 52 y.o.   MRN: [id number]  Chief Complaint  Patient presents with  . Follow-up  . Referral    Allergies [place] and Other  Subjective:   Monica Carlson is a 52 y.o. female who presents to [facility] today.  HPI Monica Carlson presents today with Monica Carlson for follow up after her recent visit to ED at [facility] for vision loss in right eye on 05/19/17. Several weeks prior to this visit had surgery on the right eye for vision issues. Then on 3/29 was trying to read a text and could not see anything out of right eye. Called and got help, community social worker came and took her to the emergency department.  Was evaluated in the emergency department.  CT scan of the head was performed which was negative.  She had labs done which revealed a blood sugar of 28.  She was treated for symptomatic hypoglycemia and her sugars returned to normal.  Her renal function was increased and they were unable to perform a MRA.  She reports that she not eaten any meals b/c reports that she had no food in the house.  She was given food.  She reports that her food stamps or not enough to cover her to buy adequate food for the month.  She does not have transportation to go to any food pantries.  Reports that now has vision back in her right eye after her blood sugar resolved and came back to normal.  She was seen by her eye doctor this week and she does have a visual field deficit in the right eye but she has regained her sight.  Her vision loss was thought to be due to her hypoglycemia.  She is legally blind in her left eye.  She does have some difficulty in meal preparation due to being able to read directions on packaging and being able to see knobs on her stove.  She also has trouble seeing the numbers on the microwave.  Even with checking her blood sugars it is difficult for her to read the numbers.  She did not intentionally avoid food intake.  She  reports that her mood has been better. Reports that does not want to take medication for mood. Had a low appetite with it but then when she did eat that she could not quit eating. Felt like it made her feel tired and she slept a lot. Only took it for 2 days. Reports that met someone and it has helped her mood. He lives close and he "makes her laugh". Deos not feel sad, down, or hoepless. Appetite is good. Is occasionally skipping meals b/c does not have food. Does not have money for food and does not have enough food stamps.   Here with social worker to discuss PCS for patient. PCS would help with cooking meals, maintain sugar checks, med reminders. They could come qd for 2-3 hours a day if she would qualify. Nurse would come to do an assessment to see if she is eligible for hours. If so, she would qualify for help. She reports that she does forget to take medications. Cousin tries to help with giving her medications.   Has trouble cooking due to vision issues. Has trouble reading the instructions due to vision. Hard to see numbers on microwave. Needs adaptive equiptment. Cannot see readings on blood sugar meter. Is legally blind in left eye, has to  check and see about the right eye with the eye doctor.    She would also like a prescription for allergy medicine.  Reports that she suffers from seasonal allergies and gets nasal congestion and postnasal drip.  Would like a refill on this medication.  She is used Claritin in the past and it works well.   Past Medical History:  Diagnosis Date  . Cervical cancer ([place])    cryotherapy and colposcopy/[place]; 1991  . CHF (congestive heart failure) ([place]) 07-2014  . Diabetes mellitus without complication ([place]) 8527  . High cholesterol   . Hypertension   . MI (myocardial infarction) ([place])    2004  . Stroke [facility]) 09/2014, 2013    Past Surgical History:  Procedure Laterality Date  . CHOLECYSTECTOMY  1992  . CORONARY STENT PLACEMENT  2004  .  TUBAL LIGATION      Family History  Problem Relation Age of Onset  . Migraines Mother   . Heart disease Father   . Hypertension Father   . Diabetes Father   . Migraines Maternal Grandfather   . Heart attack Maternal Grandfather   . Colon cancer Neg Hx   . Colon polyps Neg Hx      Social History   Socioeconomic History  . Marital status: Legally Separated    Spouse name: Not on file  . Number of children: Not on file  . Years of education: Not on file  . Highest education level: Not on file  Occupational History  . Not on file  Social Needs  . Financial resource strain: Not on file  . Food insecurity:    Worry: Not on file    Inability: Not on file  . Transportation needs:    Medical: Not on file    Non-medical: Not on file  Tobacco Use  . Smoking status: Never Smoker  . Smokeless tobacco: Never Used  Substance and Sexual Activity  . Alcohol use: No  . Drug use: No  . Sexual activity: Yes    Birth control/protection: None, Surgical  Lifestyle  . Physical activity:    Days per week: Not on file    Minutes per session: Not on file  . Stress: Not on file  Relationships  . Social connections:    Talks on phone: Not on file    Gets together: Not on file    Attends religious service: Not on file    Active member of club or organization: Not on file    Attends meetings of clubs or organizations: Not on file    Relationship status: Not on file  Other Topics Concern  . Not on file  Social History Narrative   Lives alone.    Has some friends.    Neighbors.     Review of Systems  Constitutional: Negative for activity change, appetite change and fever.  HENT: Positive for congestion, postnasal drip and sneezing. Negative for tinnitus, trouble swallowing and voice change.        Reports some sneezing, itchy nose and throat.  No swelling in oral cavity.  No difficulty swallowing.  Voice is normal.  Eyes: Positive for visual disturbance.       No acute vision  changes.  Patient does have noted visual defects which are stable.  She has been declared legally blind in her left eye.  Has reported visual defects in her left upper visual fields.  Respiratory: Negative for cough, chest tightness and shortness of breath.   Cardiovascular: Negative  for chest pain, palpitations and leg swelling.  Gastrointestinal: Negative for abdominal pain, nausea and vomiting.  Genitourinary: Negative for dysuria, frequency and urgency.  Musculoskeletal: Negative for arthralgias.  Neurological: Negative for dizziness, syncope and light-headedness.  Hematological: Negative for adenopathy.  Psychiatric/Behavioral: Negative for agitation, behavioral problems, confusion, decreased concentration, dysphoric mood, hallucinations, self-injury, sleep disturbance and suicidal ideas. The patient is not nervous/anxious and is not hyperactive.    Current Outpatient Medications on File Prior to Visit  Medication Sig Dispense Refill  . aspirin 325 MG EC tablet Take 1 tablet (325 mg total) by mouth daily.    Marland Kitchen atorvastatin (LIPITOR) 80 MG tablet TAKE ONE TABLET BY MOUTH DAILY (Patient taking differently: TAKE 80 MG BY MOUTH DAILY) 90 tablet 1  . carvedilol (COREG) 3.125 MG tablet TAKE ONE TABLET BY MOUTH TWICE DAILY WITH A MEAL (Patient taking differently: TAKE 3.125 MG BY MOUTH TWICE DAILY WITH A MEAL) 62 tablet 0  . citalopram (CELEXA) 20 MG tablet Take 1 tablet (20 mg total) by mouth daily. 30 tablet 1  . clopidogrel (PLAVIX) 75 MG tablet TAKE ONE TABLET BY MOUTH EVERY DAY WITH BREAKFAST (Patient taking differently: TAKE 75 MG BY MOUTH EVERY DAY WITH BREAKFAST) 90 tablet 1  . furosemide (LASIX) 40 MG tablet TAKE ONE TABLET BY MOUTH TWICE DAILY (Patient taking differently: TAKE 40 MG BY MOUTH TWICE DAILY) 180 tablet 1  . gabapentin (NEURONTIN) 300 MG capsule TAKE ONE CAPSULE BY MOUTH TWICE DAILY (Patient taking differently: TAKE 300 MG BY MOUTH TWICE DAILY) 62 capsule 0  . levothyroxine  (SYNTHROID, LEVOTHROID) 75 MCG tablet TAKE ONE TABLET BY MOUTH EVERY DAY (Patient taking differently: TAKE 75 MCG BY MOUTH EVERY DAY) 90 tablet 0  . metFORMIN (GLUCOPHAGE) 1000 MG tablet TAKE 1 TABLET BY MOUTH TWICE DAILY (Patient taking differently: TAKE 1000 MG BY MOUTH TWICE DAILY) 62 tablet 0  . nitroGLYCERIN (NITROSTAT) 0.4 MG SL tablet Place 1 tablet (0.4 mg total) under the tongue every 5 (five) minutes as needed for chest pain. 30 tablet 4  . pantoprazole (PROTONIX) 40 MG tablet TAKE ONE TABLET BY MOUTH DAILY (Patient taking differently: TAKE 40 MG BY MOUTH DAILY) 90 tablet 1  . sacubitril-valsartan (ENTRESTO) 24-26 MG Take 1 tablet by mouth 2 (two) times daily. 60 tablet 6   No current facility-administered medications on file prior to visit.      Objective:   BP 118/70 (BP Location: Left Arm, Patient Position: Sitting, Cuff Size: Normal)   Pulse 82   Temp 97.6 F (36.4 C) (Temporal)   Resp 16   Ht '5\' 1"'  (1.549 m)   Wt 189 lb 12 oz (86.1 kg)   SpO2 97%   BMI 35.85 kg/m   Physical Exam  Constitutional: She is oriented to person, place, and time. She appears well-developed and well-nourished. No distress.  HENT:  Head: Normocephalic and atraumatic.  Eyes: Pupils are equal, round, and reactive to light.  Neck: Normal range of motion. Neck supple. No thyromegaly present.  Cardiovascular: Normal rate, regular rhythm and normal heart sounds.  Pulmonary/Chest: Effort normal and breath sounds normal. No respiratory distress.  Neurological: She is alert and oriented to person, place, and time. No cranial nerve deficit.  Skin: Skin is warm and dry.  Psychiatric: She has a normal mood and affect. Her behavior is normal. Judgment and thought content normal. Cognition and memory are impaired. She does not express impulsivity or inappropriate judgment. She expresses no homicidal and no suicidal ideation. She expresses  no suicidal plans and no homicidal plans. She exhibits abnormal remote  memory.  Patient was slightly blunted mood and affect.  She does smile and has more range in her affect today than at last visit.  Speech slightly delayed.  Behavior within normal limits.  Thought content without delusions, phobias, obsessions, or compulsions.  Suspect the patient has lower baseline cognitive function.  Memory slightly impaired not a great historian.  Denies suicidal or homicidal ideations.  Speech slow.  Nursing note and vitals reviewed.  Depression screen [facility] 2/9 05/24/2017 04/20/2017 04/20/2017 11/15/2016  Decreased Interest 1 2 0 0  Down, Depressed, Hopeless 0 2 0 0  PHQ - 2 Score 1 4 0 0  Altered sleeping 0 3 - -  Tired, decreased energy 1 - - -  Change in appetite 0 1 - -  Feeling bad or failure about yourself  0 1 - -  Trouble concentrating 0 3 - -  Moving slowly or fidgety/restless 1 3 - -  Suicidal thoughts 0 0 - -  PHQ-9 Score 3 15 - -  Difficult doing work/chores Somewhat difficult Somewhat difficult - -    Assessment and Plan  1. Hypoglycemic episode in patient with diabetes mellitus [facility]) Patient with recent severe hypoglycemic episode with associated symptoms of vision loss in her right eye.  Upon treatment of hypoglycemia her vision improved.  She had reported hypoglycemic episode because she had not eaten due to the fact that she did not have any food.  She has present today with her social worker who is going to assist with her food stamps and getting food from the food pantry.  She is to have a Education officer, museum help with her meal management.  She is also been struggling with meal preparation secondary to the fact that she cannot see well enough to read directions and to cook.  We are going to complete paperwork today for personal care services to assist her with these areas.  Paperwork was completed in the office today.  Did discuss hypoglycemia in detail today.  We discussed the fact that she should have stopped her Amaryl as was previously ordered due to a lowered  hemoglobin A1c.  She had not stopped this medication.  Patient social worker is going to go to [facility] where her pill packs are packaged and these pills will be removed from the pill packs.  We did discuss signs and symptoms of hypoglycemia and ways to manage this.  Patient reportedly was not symptomatic other than having vision changes with hypoglycemia. 2. Seasonal allergies Use as directed over-the-counter. - loratadine (CLARITIN) 10 MG tablet; Take 1 tablet (10 mg total) by mouth daily.  Dispense: 30 tablet; Refill: 11  3. Acute systolic congestive heart failure (HCC) Stable, keep scheduled follow-up with Dr. Bronson Ing.  4. High cholesterol Tinea medications as directed.  5. History of CVA (cerebrovascular accident) Do believe the patient has some cognitive deficits secondary to her history of strokes.  I believe that her affect has also been somewhat affected by her strokes.  Paperwork was completed for her to get medication assistance/personal care services to help with medication management.  6. History of MI (myocardial infarction) Stable.  Follow-up with cardiology.  7. Type 2 diabetes mellitus with stage 2 chronic kidney disease, without long-term current use of insulin ([place]) Patient had elevated creatinine while she was at the emergency department.  We will recheck these labs today.  Continue current medications.  DC Amaryl.  Continue metformin as  directed.  Patient is due for hemoglobin A1c check in 1-2 months.  OV  45 min. Greater than 50% office visit spent counseling and coordinating care.  She wishes to not proceed with taking any SSRI medication or medication for her mood.  She reports that her mood has improved since meeting a new female friend.  We will continue to monitor at follow-up visit in 1 month.  She will obtain lab work which was not done as requested at last visit. Return in about 1 month (around 06/21/2017) for pap smear. Caren Macadam, MD 05/24/2017

## 2017-05-24 NOTE — Progress Notes (Signed)
Delete.

## 2017-05-24 NOTE — Patient Instructions (Signed)
STOP AMARYL-GET IT OUT OF THE PILL PACKS!!

## 2017-05-25 ENCOUNTER — Telehealth: Payer: Self-pay | Admitting: Family Medicine

## 2017-05-25 DIAGNOSIS — N183 Chronic kidney disease, stage 3 unspecified: Secondary | ICD-10-CM

## 2017-05-25 DIAGNOSIS — R7989 Other specified abnormal findings of blood chemistry: Secondary | ICD-10-CM

## 2017-05-25 LAB — BASIC METABOLIC PANEL
BUN/Creatinine Ratio: 18 (calc) (ref 6–22)
BUN: 40 mg/dL — ABNORMAL HIGH (ref 7–25)
CALCIUM: 9.3 mg/dL (ref 8.6–10.4)
CO2: 33 mmol/L — ABNORMAL HIGH (ref 20–32)
CREATININE: 2.18 mg/dL — AB (ref 0.50–1.05)
Chloride: 97 mmol/L — ABNORMAL LOW (ref 98–110)
GLUCOSE: 57 mg/dL — AB (ref 65–99)
Potassium: 4.2 mmol/L (ref 3.5–5.3)
SODIUM: 141 mmol/L (ref 135–146)

## 2017-05-25 LAB — CBC WITH DIFFERENTIAL/PLATELET
BASOS ABS: 28 {cells}/uL (ref 0–200)
Basophils Relative: 0.3 %
Eosinophils Absolute: 167 cells/uL (ref 15–500)
Eosinophils Relative: 1.8 %
HCT: 31.6 % — ABNORMAL LOW (ref 35.0–45.0)
HEMOGLOBIN: 11 g/dL — AB (ref 11.7–15.5)
LYMPHS ABS: 2539 {cells}/uL (ref 850–3900)
MCH: 32.4 pg (ref 27.0–33.0)
MCHC: 34.8 g/dL (ref 32.0–36.0)
MCV: 92.9 fL (ref 80.0–100.0)
MONOS PCT: 4.9 %
MPV: 11.1 fL (ref 7.5–12.5)
NEUTROS ABS: 6110 {cells}/uL (ref 1500–7800)
Neutrophils Relative %: 65.7 %
Platelets: 348 10*3/uL (ref 140–400)
RBC: 3.4 10*6/uL — AB (ref 3.80–5.10)
RDW: 12.4 % (ref 11.0–15.0)
Total Lymphocyte: 27.3 %
WBC mixed population: 456 cells/uL (ref 200–950)
WBC: 9.3 10*3/uL (ref 3.8–10.8)

## 2017-05-25 LAB — IRON,TIBC AND FERRITIN PANEL
%SAT: 31 % (ref 11–50)
FERRITIN: 251 ng/mL — AB (ref 10–232)
IRON: 102 ug/dL (ref 45–160)
TIBC: 329 mcg/dL (calc) (ref 250–450)

## 2017-05-25 NOTE — Telephone Encounter (Signed)
Please call patient and her social worker and advised that she does need to see a nephrologist.  Her renal function has worsened.  She is anemic, however I believe this is due to her kidney disease.  She is not iron deficient and does not need to be taking iron pills at this time.  We will place a referral to nephrologist at this time. Please advise SW Reyne Dumas of appointment. I do believe he is on her DPR. Need to fax all labs to nephrologist. Gwen Her. Mannie Stabile, MD

## 2017-05-25 NOTE — Telephone Encounter (Signed)
Called patient regarding message below. No answer, left generic message for patient to return call.   Left message on Rudy Jew phone

## 2017-05-29 ENCOUNTER — Telehealth: Payer: Self-pay | Admitting: *Deleted

## 2017-05-29 NOTE — Telephone Encounter (Signed)
Received call from [place] in Endo. Patient ate a bowel of raisen brand this morning and per RMR pt will need to be rescheduled for procedure. Patient case worked called and stated they will call back to get this r/s. ik

## 2017-05-29 NOTE — Telephone Encounter (Signed)
I have been unable to reach this patient by phone.  A letter is being sent.

## 2017-05-29 NOTE — OR Nursing (Signed)
Called patient for pre-procedure call. Patient states she ate a bowl of raisin bran at 1000 this morning. Dr. Gala Romney notified and said to have patient's procedure rescheduled. Monica Carlson at Dr. Roseanne Kaufman office notified and will notify patient to get her rescheduled.

## 2017-05-30 ENCOUNTER — Ambulatory Visit (HOSPITAL_COMMUNITY): Admit: 2017-05-30 | Payer: Medicaid Other | Admitting: Internal Medicine

## 2017-05-30 ENCOUNTER — Encounter (HOSPITAL_COMMUNITY): Payer: Self-pay

## 2017-05-30 SURGERY — COLONOSCOPY
Anesthesia: Moderate Sedation

## 2017-05-30 NOTE — Telephone Encounter (Signed)
LMOVM for Monica Carlson social worker for pt.

## 2017-06-01 ENCOUNTER — Encounter: Payer: Self-pay | Admitting: *Deleted

## 2017-06-01 ENCOUNTER — Other Ambulatory Visit: Payer: Self-pay | Admitting: *Deleted

## 2017-06-01 DIAGNOSIS — Z1211 Encounter for screening for malignant neoplasm of colon: Secondary | ICD-10-CM

## 2017-06-01 MED ORDER — CLENPIQ 10-3.5-12 MG-GM -GM/160ML PO SOLN: 1.0000 | Freq: Once | ORAL | 0 refills | Status: AC

## 2017-06-01 NOTE — Telephone Encounter (Signed)
LMOVM for Monica Carlson

## 2017-06-01 NOTE — Telephone Encounter (Signed)
Monica Carlson called back. Procedure scheduled for 07/05/17 at 2:00pm. He would like prep instructions faxed to him at [phone]. I have done so. He is also aware new prep rx has been sent into the pharmacy

## 2017-06-02 ENCOUNTER — Ambulatory Visit (INDEPENDENT_AMBULATORY_CARE_PROVIDER_SITE_OTHER): Payer: Medicare Other

## 2017-06-02 ENCOUNTER — Other Ambulatory Visit: Payer: Self-pay

## 2017-06-02 DIAGNOSIS — I429 Cardiomyopathy, unspecified: Secondary | ICD-10-CM

## 2017-06-06 ENCOUNTER — Telehealth: Payer: Self-pay | Admitting: *Deleted

## 2017-06-06 NOTE — Telephone Encounter (Signed)
Notes recorded by Laurine Blazer, LPN on 0/76/1518 at 34:37 AM EDT Patient notified. Copy to pmd. Follow up scheduled for July with Dr. Bronson Ing.   ------  Notes recorded by Herminio Commons, MD on 06/05/2017 at 4:55 PM EDT LV function has improved since August 2018. Continue current meds.

## 2017-06-20 ENCOUNTER — Other Ambulatory Visit: Payer: Self-pay | Admitting: Family Medicine

## 2017-06-20 DIAGNOSIS — E1140 Type 2 diabetes mellitus with diabetic neuropathy, unspecified: Secondary | ICD-10-CM

## 2017-06-27 ENCOUNTER — Telehealth: Payer: Self-pay | Admitting: Family Medicine

## 2017-06-27 NOTE — Telephone Encounter (Signed)
Called patient to reschedule appt, no answer, mailbox is full. I mailed her a letter with a reschedule request.

## 2017-07-04 ENCOUNTER — Telehealth: Payer: Self-pay | Admitting: Internal Medicine

## 2017-07-04 NOTE — Telephone Encounter (Signed)
Pt's social worker [name] [place]) called to cancel the patient's colonoscopy that is scheduled for tomorrow with RMR. Patient does not want to have this done.

## 2017-07-04 NOTE — Telephone Encounter (Signed)
Called spoke with pt and she states she does not want to have TCS done. Pt states she just does not want to do it and her BF told her that "this doesn't work most of the time".  Called Endo and made aware to cancel TCS.   FYI to LSL

## 2017-07-04 NOTE — Telephone Encounter (Signed)
Noted. No further recommendations.  

## 2017-07-05 ENCOUNTER — Encounter (HOSPITAL_COMMUNITY): Admission: RE | Payer: Self-pay | Source: Ambulatory Visit

## 2017-07-05 ENCOUNTER — Ambulatory Visit (HOSPITAL_COMMUNITY): Admission: RE | Admit: 2017-07-05 | Payer: Medicaid Other | Source: Ambulatory Visit | Admitting: Internal Medicine

## 2017-07-05 SURGERY — COLONOSCOPY
Anesthesia: Moderate Sedation

## 2017-07-10 ENCOUNTER — Other Ambulatory Visit: Payer: Self-pay | Admitting: Family Medicine

## 2017-07-10 DIAGNOSIS — R1319 Other dysphagia: Secondary | ICD-10-CM

## 2017-07-10 DIAGNOSIS — E039 Hypothyroidism, unspecified: Secondary | ICD-10-CM

## 2017-07-10 DIAGNOSIS — R1310 Dysphagia, unspecified: Secondary | ICD-10-CM

## 2017-07-10 DIAGNOSIS — I2510 Atherosclerotic heart disease of native coronary artery without angina pectoris: Secondary | ICD-10-CM

## 2017-07-10 DIAGNOSIS — I5033 Acute on chronic diastolic (congestive) heart failure: Secondary | ICD-10-CM

## 2017-07-10 DIAGNOSIS — E1140 Type 2 diabetes mellitus with diabetic neuropathy, unspecified: Secondary | ICD-10-CM

## 2017-07-10 DIAGNOSIS — I252 Old myocardial infarction: Secondary | ICD-10-CM

## 2017-07-12 ENCOUNTER — Encounter: Payer: Medicaid Other | Admitting: Family Medicine

## 2017-07-13 ENCOUNTER — Telehealth: Payer: Self-pay | Admitting: Family Medicine

## 2017-07-13 NOTE — Telephone Encounter (Signed)
Monica Carlson is calling in regarding the pt he states that the Kidney Dr told the PT to stop taking the Metformin & Intresto, is there something different you can prescribe

## 2017-07-13 NOTE — Telephone Encounter (Signed)
Left message requesting  call back.

## 2017-07-13 NOTE — Telephone Encounter (Signed)
Please advise that she should just follow up for an appointment. The entresto is prescribed by her cardiologist. We will need to get her in with an appointment with [place] and then we can get her in with cardiology. Please get the notes from nephrology.

## 2017-07-19 NOTE — Telephone Encounter (Signed)
Please find out when she follows up with cardiology.  She needs to see 1 of [place] related to her blood pressure.

## 2017-07-19 NOTE — Telephone Encounter (Signed)
Spoke with patient and discussed Dr.Hagler's recommendations below. She has a f/u appt with Dr.Hagler and cardiology but not until July. Told her I would send Dr.Hagler a message, but I'm unsure if I am able to get her in sooner because of the schedule. She verbalized understanding.

## 2017-07-20 NOTE — Telephone Encounter (Signed)
Left message requesting patient or Monica Carlson call us back.

## 2017-07-20 NOTE — Telephone Encounter (Signed)
Monica Carlson called back to talk to you and before I could get you to the phone, he hung up

## 2017-07-21 NOTE — Telephone Encounter (Signed)
Spoke with patient and advised her that either Dr.Hagler or cardiology needs to follow up with her with blood pressure. Johnny always makes appointments for the patient and he called yesterday but hung up before anyone could get to the phone to speak with him. I asked her for the phone number for Johnny and she stated she couldn't see her phone to give me the phone number. I told her that all he had to do was call our office and tell the person that answered the phone he needed to make her an appt to follow up for bp or he could call cardiology for a follow up. She verbalized understanding.

## 2017-07-25 DIAGNOSIS — H43813 Vitreous degeneration, bilateral: Secondary | ICD-10-CM | POA: Diagnosis not present

## 2017-07-25 DIAGNOSIS — H4312 Vitreous hemorrhage, left eye: Secondary | ICD-10-CM | POA: Diagnosis not present

## 2017-07-25 DIAGNOSIS — E113521 Type 2 diabetes mellitus with proliferative diabetic retinopathy with traction retinal detachment involving the macula, right eye: Secondary | ICD-10-CM | POA: Diagnosis not present

## 2017-07-25 DIAGNOSIS — E113542 Type 2 diabetes mellitus with proliferative diabetic retinopathy with combined traction retinal detachment and rhegmatogenous retinal detachment, left eye: Secondary | ICD-10-CM | POA: Diagnosis not present

## 2017-07-27 ENCOUNTER — Other Ambulatory Visit (HOSPITAL_COMMUNITY): Payer: Self-pay | Admitting: Nephrology

## 2017-07-27 DIAGNOSIS — N183 Chronic kidney disease, stage 3 unspecified: Secondary | ICD-10-CM

## 2017-07-28 ENCOUNTER — Encounter: Payer: Self-pay | Admitting: Family Medicine

## 2017-08-03 ENCOUNTER — Telehealth: Payer: Self-pay | Admitting: Family Medicine

## 2017-08-03 NOTE — Telephone Encounter (Signed)
Please call patient and advise that I received the notes from renal doctor. Has she stopped the entresto and metformin? She needs to make sure she follows up. Bring all medications to follow up. Gwen Her. Mannie Stabile, MD

## 2017-08-03 NOTE — Telephone Encounter (Signed)
Left message requesting  call back.

## 2017-08-04 ENCOUNTER — Telehealth: Payer: Self-pay | Admitting: Family Medicine

## 2017-08-04 NOTE — Telephone Encounter (Signed)
Patient left voicemail requesting a call back/ no details as to what she needed.  I called back the # provided [phone]/ [phone] No answer, left voicemail to call the office.

## 2017-08-08 ENCOUNTER — Encounter (HOSPITAL_COMMUNITY): Payer: Self-pay

## 2017-08-08 ENCOUNTER — Ambulatory Visit (HOSPITAL_COMMUNITY)
Admission: RE | Admit: 2017-08-08 | Discharge: 2017-08-08 | Disposition: A | Discharge status: Home / Self Care | Payer: Medicaid Other | Source: Ambulatory Visit | Attending: Nephrology | Admitting: Nephrology

## 2017-08-09 ENCOUNTER — Encounter: Payer: Self-pay | Admitting: Nephrology

## 2017-08-09 ENCOUNTER — Telehealth: Payer: Self-pay | Admitting: Family Medicine

## 2017-08-09 DIAGNOSIS — D509 Iron deficiency anemia, unspecified: Secondary | ICD-10-CM | POA: Diagnosis not present

## 2017-08-09 DIAGNOSIS — D519 Vitamin B12 deficiency anemia, unspecified: Secondary | ICD-10-CM | POA: Diagnosis not present

## 2017-08-09 DIAGNOSIS — Z79899 Other long term (current) drug therapy: Secondary | ICD-10-CM | POA: Diagnosis not present

## 2017-08-09 DIAGNOSIS — I10 Essential (primary) hypertension: Secondary | ICD-10-CM | POA: Diagnosis not present

## 2017-08-09 DIAGNOSIS — Z1159 Encounter for screening for other viral diseases: Secondary | ICD-10-CM | POA: Diagnosis not present

## 2017-08-09 DIAGNOSIS — R809 Proteinuria, unspecified: Secondary | ICD-10-CM | POA: Diagnosis not present

## 2017-08-09 DIAGNOSIS — E559 Vitamin D deficiency, unspecified: Secondary | ICD-10-CM | POA: Diagnosis not present

## 2017-08-09 DIAGNOSIS — N183 Chronic kidney disease, stage 3 (moderate): Secondary | ICD-10-CM | POA: Diagnosis not present

## 2017-08-09 NOTE — Telephone Encounter (Signed)
Spoke with patient and she says she has stopped the Metformin because she knows what it looks like. She doesn't know what the Delene Loll looks like. Her medication is bubble wrapped at [facility]. I called [facility]. [place]i stated it has been a while since she has had the Entresto filled. He will discontinue the Metformin so it won't be put in the next bubble wraps. Patient verbalized understanding to bring all medications to next visit so we can reconcile her list.

## 2017-08-09 NOTE — Telephone Encounter (Signed)
Patient left voicemail requesting a call back to discuss meds, I called her back, no answer.

## 2017-08-10 ENCOUNTER — Encounter: Payer: Self-pay | Admitting: Nephrology

## 2017-08-14 ENCOUNTER — Telehealth: Payer: Self-pay | Admitting: Family Medicine

## 2017-08-14 NOTE — Telephone Encounter (Signed)
Patient called [facility] and wanted a refill for Nitro.  Tye Maryland said she does not have this on file and if you need it just send her a RX and she will fill it for the patient.

## 2017-08-15 ENCOUNTER — Other Ambulatory Visit: Payer: Self-pay

## 2017-08-15 MED ORDER — NITROGLYCERIN 0.4 MG SL SUBL: 0.4000 mg | SUBLINGUAL_TABLET | SUBLINGUAL | 2 refills | Status: AC | PRN

## 2017-08-15 NOTE — Telephone Encounter (Signed)
Call the nitroglyercin into [facility]. Gwen Her. Mannie Stabile, MD

## 2017-08-15 NOTE — Telephone Encounter (Signed)
Nitro refill sent in to [facility] per Dr.Hagler

## 2017-08-28 ENCOUNTER — Ambulatory Visit (HOSPITAL_COMMUNITY)
Admission: RE | Admit: 2017-08-28 | Discharge: 2017-08-28 | Disposition: A | Discharge status: Home / Self Care | Payer: Medicare Other | Source: Ambulatory Visit | Attending: Nephrology | Admitting: Nephrology

## 2017-08-28 DIAGNOSIS — I129 Hypertensive chronic kidney disease with stage 1 through stage 4 chronic kidney disease, or unspecified chronic kidney disease: Secondary | ICD-10-CM | POA: Diagnosis not present

## 2017-08-28 DIAGNOSIS — N183 Chronic kidney disease, stage 3 unspecified: Secondary | ICD-10-CM

## 2017-08-31 ENCOUNTER — Encounter: Payer: Self-pay | Admitting: Family Medicine

## 2017-08-31 ENCOUNTER — Ambulatory Visit (INDEPENDENT_AMBULATORY_CARE_PROVIDER_SITE_OTHER): Payer: Medicare Other | Admitting: Family Medicine

## 2017-08-31 ENCOUNTER — Other Ambulatory Visit: Payer: Self-pay

## 2017-08-31 VITALS — BP 172/84 | HR 12 | Temp 98.2°F | Resp 12 | Ht 61.0 in | Wt 197.1 lb

## 2017-08-31 DIAGNOSIS — E1122 Type 2 diabetes mellitus with diabetic chronic kidney disease: Secondary | ICD-10-CM

## 2017-08-31 DIAGNOSIS — Z8541 Personal history of malignant neoplasm of cervix uteri: Secondary | ICD-10-CM

## 2017-08-31 DIAGNOSIS — Z1211 Encounter for screening for malignant neoplasm of colon: Secondary | ICD-10-CM | POA: Diagnosis not present

## 2017-08-31 DIAGNOSIS — Z79899 Other long term (current) drug therapy: Secondary | ICD-10-CM

## 2017-08-31 DIAGNOSIS — I10 Essential (primary) hypertension: Secondary | ICD-10-CM

## 2017-08-31 DIAGNOSIS — Z0000 Encounter for general adult medical examination without abnormal findings: Secondary | ICD-10-CM | POA: Diagnosis not present

## 2017-08-31 DIAGNOSIS — E782 Mixed hyperlipidemia: Secondary | ICD-10-CM | POA: Diagnosis not present

## 2017-08-31 DIAGNOSIS — E039 Hypothyroidism, unspecified: Secondary | ICD-10-CM | POA: Diagnosis not present

## 2017-08-31 DIAGNOSIS — N182 Chronic kidney disease, stage 2 (mild): Secondary | ICD-10-CM

## 2017-08-31 MED ORDER — AMLODIPINE BESYLATE 10 MG PO TABS
10.0000 mg | ORAL_TABLET | Freq: Every day | ORAL | 3 refills | Status: DC
Start: 2017-08-31 — End: 2017-09-05

## 2017-08-31 NOTE — Progress Notes (Signed)
Patient ID: Monica Carlson, female    DOB: [birth year], 52 y.o.   MRN: [id number]  Chief Complaint  Patient presents with  . Annual Exam    Allergies Cedar and Other  Subjective:   Monica Carlson is a 52 y.o. female who presents to [facility] today.  HPI Monica Carlson presents today for her complete physical examination.  She reports that she is doing well.  She has been seen by nephrology/Dr. Theador Hawthorne and is now off her metformin and Entresto.  She reports that she has been doing well.  Appetite is good.  Energy level is good.  She is taking the medications for her mood.  Her blood pressure has been running a little bit high.  She denies any swelling in her extremities.  She does not feel short of breath.  Denies any chest pain, palpitations, orthopnea, or dyspnea on exertion.  She reports her bowel movements are normal.  She defers a colonoscopy but but would be willing for Cologuard testing.  She reports that she has a prior history of cervical cancer and is not been seen by gynecology in many years.  She would like to be seen by gynecology.  She denies any vaginal bleeding.  She denies any vaginal discharge.  She is up-to-date with her mammogram.  She reports she will get her breast exam done by her gynecologist.  She reports she is taking all her medications as directed.  She gets her medications in the pill packets by [employer].  She has not been to a dentist.  She is followed by eye doctor and does have vision loss.  Diabetic eye exam is up-to-date.  She is on aspirin and Plavix due to her recurrent strokes.  She is taking her cholesterol medication without any myalgias.  She reports that she has gained about 10 pounds over the past 3 months.  Her appetite is improved.  She reports that she has felt better and is in a relationship with a man at this time.  She reports that this has helped with her mood.  She reports that she does have a place on her back that has been there for many years  that in the past got red and enlarged and then went down.  She reports she was told this was a cyst.   Past Medical History:  Diagnosis Date  . Cervical cancer ([place])    cryotherapy and colposcopy/[place], [place]; 1991  . CHF (congestive heart failure) ([place]) 07-2014  . Diabetes mellitus without complication ([place]) 0383  . High cholesterol   . Hypertension   . MI (myocardial infarction) ([place])    2004  . Stroke [facility]) 09/2014, 2013    Past Surgical History:  Procedure Laterality Date  . CHOLECYSTECTOMY  1992  . CORONARY STENT PLACEMENT  2004  . TUBAL LIGATION      Family History  Problem Relation Age of Onset  . Migraines Mother   . Heart disease Father   . Hypertension Father   . Diabetes Father   . Migraines Maternal Grandfather   . Heart attack Maternal Grandfather   . Colon cancer Neg Hx   . Colon polyps Neg Hx      Social History   Socioeconomic History  . Marital status: Legally Separated    Spouse name: Not on file  . Number of children: Not on file  . Years of education: Not on file  . Highest education level: Not on file  Occupational History  .  Not on file  Social Needs  . Financial resource strain: Not on file  . Food insecurity:    Worry: Not on file    Inability: Not on file  . Transportation needs:    Medical: Not on file    Non-medical: Not on file  Tobacco Use  . Smoking status: Never Smoker  . Smokeless tobacco: Never Used  Substance and Sexual Activity  . Alcohol use: No  . Drug use: No  . Sexual activity: Yes    Birth control/protection: None, Surgical  Lifestyle  . Physical activity:    Days per week: Not on file    Minutes per session: Not on file  . Stress: Not on file  Relationships  . Social connections:    Talks on phone: Not on file    Gets together: Not on file    Attends religious service: Not on file    Active member of club or organization: Not on file    Attends meetings of clubs or organizations: Not on file     Relationship status: Not on file  Other Topics Concern  . Not on file  Social History Narrative   Lives alone.    Has some friends.    Neighbors.     Review of Systems  Constitutional: Positive for unexpected weight change. Negative for activity change, appetite change and fever.  Eyes: Negative for visual disturbance.  Respiratory: Negative for cough, chest tightness and shortness of breath.   Cardiovascular: Negative for chest pain, palpitations and leg swelling.  Gastrointestinal: Negative for abdominal pain, nausea and vomiting.  Genitourinary: Negative for dysuria, frequency and urgency.  Neurological: Negative for dizziness, syncope and light-headedness.  Hematological: Negative for adenopathy.    Current Outpatient Medications on File Prior to Visit  Medication Sig Dispense Refill  . amLODipine (NORVASC) 5 MG tablet Take 5 mg by mouth daily.  4  . aspirin 325 MG EC tablet Take 1 tablet (325 mg total) by mouth daily.    Marland Kitchen atorvastatin (LIPITOR) 80 MG tablet TAKE ONE TABLET BY MOUTH DAILY 90 tablet 1  . carvedilol (COREG) 3.125 MG tablet TAKE ONE TABLET BY MOUTH TWICE DAILY WITH A MEAL 62 tablet 0  . citalopram (CELEXA) 20 MG tablet Take 1 tablet (20 mg total) by mouth daily. 30 tablet 1  . clopidogrel (PLAVIX) 75 MG tablet TAKE ONE TABLET BY MOUTH EVERY DAY WITH BREAKFAST 90 tablet 1  . furosemide (LASIX) 40 MG tablet TAKE ONE TABLET BY MOUTH TWICE DAILY 180 tablet 1  . gabapentin (NEURONTIN) 300 MG capsule TAKE ONE CAPSULE BY MOUTH TWICE DAILY 62 capsule 0  . glimepiride (AMARYL) 4 MG tablet Take 4 mg by mouth every morning.  1  . levothyroxine (SYNTHROID, LEVOTHROID) 75 MCG tablet TAKE ONE TABLET BY MOUTH EVERY DAY 90 tablet 0  . loratadine (CLARITIN) 10 MG tablet Take 1 tablet (10 mg total) by mouth daily. 30 tablet 11  . nitroGLYCERIN (NITROSTAT) 0.4 MG SL tablet Place 1 tablet (0.4 mg total) under the tongue every 5 (five) minutes as needed for chest pain. 30 tablet 2  .  pantoprazole (PROTONIX) 40 MG tablet TAKE ONE TABLET BY MOUTH DAILY 90 tablet 1   No current facility-administered medications on file prior to visit.     Objective:   BP (!) 172/84 (BP Location: Left Arm, Patient Position: Sitting, Cuff Size: Large)   Pulse (!) 12   Temp 98.2 F (36.8 C) (Temporal)   Resp 12  Ht 5\' 1"  (1.549 m)   Wt 197 lb 1.3 oz (89.4 kg)   SpO2 94%   BMI 37.24 kg/m   Physical Exam  Constitutional: She is oriented to person, place, and time. She appears well-developed and well-nourished. No distress.  HENT:  Head: Normocephalic and atraumatic.  Right Ear: External ear normal.  Left Ear: External ear normal.  Nose: Nose normal.  Mouth/Throat: Oropharynx is clear and moist. No oropharyngeal exudate.  Eyes: Pupils are equal, round, and reactive to light. Conjunctivae and EOM are normal. No scleral icterus.  Neck: Normal range of motion. Neck supple. No JVD present. No tracheal deviation present. No thyromegaly present.  Cardiovascular: Normal rate, regular rhythm, normal heart sounds and intact distal pulses.  Pulmonary/Chest: Effort normal and breath sounds normal. No stridor. No respiratory distress. She has no wheezes. She has no rales.  Abdominal: Soft. Bowel sounds are normal. She exhibits no distension and no mass. There is no tenderness. There is no guarding.  Lymphadenopathy:    She has no cervical adenopathy.  Neurological: She is alert and oriented to person, place, and time. She displays normal reflexes. No cranial nerve deficit. Coordination normal.  Skin: Skin is warm and dry. Capillary refill takes less than 2 seconds. No rash noted.  Left lower back with approximate 1 x 1 cm palpable cystic area, nontender to palpation. Left lower gluteal region with palpable 1 x 1 cm cystic lesion.  No associated erythema.  Psychiatric: She has a normal mood and affect. Her behavior is normal. Judgment and thought content normal.  Nursing note and vitals  reviewed.   Depression screen [facility] 2/9 08/31/2017 05/24/2017 04/20/2017 04/20/2017 11/15/2016  Decreased Interest 0 1 2 0 0  Down, Depressed, Hopeless 0 0 2 0 0  PHQ - 2 Score 0 1 4 0 0  Altered sleeping - 0 3 - -  Tired, decreased energy - 1 - - -  Change in appetite - 0 1 - -  Feeling bad or failure about yourself  - 0 1 - -  Trouble concentrating - 0 3 - -  Moving slowly or fidgety/restless - 1 3 - -  Suicidal thoughts - 0 0 - -  PHQ-9 Score - 3 15 - -  Difficult doing work/chores - Somewhat difficult Somewhat difficult - -    Assessment and Plan  1. Well adult exam Age-appropriate anticipatory guidance was given and discussed with patient. Patient was asked to please continue to monitor her salt intake.  Her weight was discussed today.  Is not recommended that she gained a lot of weight but try to eat a healthy diet which is low in salt. Follow-up with cardiology and nephrology as directed. Vision exam/ophthalmology visits recommended as directed. Patient defers Pneumovax immunizations.  Patient defers and is always deferred influenza vaccinations. 2. Screen for colon cancer Patient defers colonoscopy.  Cologuard testing including risk, benefits, need for recheck in 3 years if negative, and testing was discussed with patient today.  She voiced understanding. - Cologuard  3. History of cervical cancer Referral placed to gynecologist for evaluation and GYN care. - Ambulatory referral to Obstetrics / Gynecology  4. Essential hypertension Increase Norvasc to 10 mg p.o. daily.  Today I discussed with community health worker and they will talk with the drug regarding her pill packs and the appropriate way to get her dosing up to 10 mg a day. - amLODipine (NORVASC) 10 MG tablet; Take 1 tablet (10 mg total) by mouth daily.  Dispense: 90 tablet; Refill: 3  5. Hypothyroidism, unspecified type Compliance with medication.  Check TSH today to make sure her levels are okay secondary to weight  gain. - TSH  6. Mixed hyperlipidemia Labs.  Continue current medications.  Diet, exercise, and healthy lifestyle modifications recommended. - Lipid panel  7. Type 2 diabetes mellitus with stage 2 chronic kidney disease, without long-term current use of insulin ([place]) Follow-up with nephrology.  Continue current management.  Check A1c.  Keep scheduled appointment with ophthalmology.  Foot care discussed with patient.  Diabetic foot exam up-to-date. - Hemoglobin A1c  8. High risk medication use - Hepatic function panel  9.  Sebaceous cyst, noninflamed. Patient defers referral for removal at this time for small sebaceous cyst on lower back and gluteal region.  She was counseled if the area becomes inflamed, irritated, or painful she should follow-up.  She reports this is been there for long periods of time and wanted reassurance that it was nothing further to worry about despite the lesion being there for greater than 10 years. No follow-ups on file. Tod Persia, [place] 08/31/2017

## 2017-08-31 NOTE — Patient Instructions (Addendum)
Dentist exam recommended. Call Dr. Theador Hawthorne about your vitamin D supplement Follow up with gynecology Increase the norvasc to 10 mg a day Follow up in 1 month.

## 2017-09-05 ENCOUNTER — Ambulatory Visit (INDEPENDENT_AMBULATORY_CARE_PROVIDER_SITE_OTHER): Payer: Medicare Other | Admitting: Cardiovascular Disease

## 2017-09-05 ENCOUNTER — Encounter: Payer: Self-pay | Admitting: Cardiovascular Disease

## 2017-09-05 VITALS — BP 130/80 | HR 68 | Ht 61.0 in | Wt 198.0 lb

## 2017-09-05 DIAGNOSIS — I429 Cardiomyopathy, unspecified: Secondary | ICD-10-CM

## 2017-09-05 DIAGNOSIS — E785 Hyperlipidemia, unspecified: Secondary | ICD-10-CM

## 2017-09-05 DIAGNOSIS — Z8673 Personal history of transient ischemic attack (TIA), and cerebral infarction without residual deficits: Secondary | ICD-10-CM

## 2017-09-05 DIAGNOSIS — I25118 Atherosclerotic heart disease of native coronary artery with other forms of angina pectoris: Secondary | ICD-10-CM | POA: Diagnosis not present

## 2017-09-05 DIAGNOSIS — I5042 Chronic combined systolic (congestive) and diastolic (congestive) heart failure: Secondary | ICD-10-CM

## 2017-09-05 DIAGNOSIS — I10 Essential (primary) hypertension: Secondary | ICD-10-CM

## 2017-09-05 DIAGNOSIS — I2510 Atherosclerotic heart disease of native coronary artery without angina pectoris: Secondary | ICD-10-CM | POA: Diagnosis not present

## 2017-09-05 NOTE — Progress Notes (Signed)
SUBJECTIVE: The patient presents for follow-up of cardiomyopathy with severe left ventricular dysfunction and coronary artery disease.  When I saw her while hospitalized in August 2018, left ventricular systolic function was severely reduced, LVEF 20%, with multiple wall motion abnormalities and grade 2 diastolic dysfunction. She was hospitalized for acute combined systolic and diastolic heart failure. She has a history of bare-metal stent to the RCA in 2004. She underwent a low risk nuclear stress test in May 2017.  She then underwent a follow-up nuclear stress test on 03/29/17 which demonstrated a large defect of moderate severity present in the apical anterior and apex.  This appeared to be consistent with prior myocardial infarction.  Follow-up echocardiogram obtained 06/02/2017 demonstrated an improvement in left ventricular systolic function, LVEF now 40 to 45%, mild LVH, diffuse hypokinesis, grade 1 diastolic dysfunction, and mild mitral regurgitation.        Review of Systems: As per "subjective", otherwise negative.  Allergies  Allergen Reactions  . Cedar     Swelling   . Other     Teriyaki- swelling    Current Outpatient Medications  Medication Sig Dispense Refill  . amLODipine (NORVASC) 5 MG tablet Take 5 mg by mouth daily.    Marland Kitchen aspirin 325 MG EC tablet Take 1 tablet (325 mg total) by mouth daily.    Marland Kitchen atorvastatin (LIPITOR) 80 MG tablet TAKE ONE TABLET BY MOUTH DAILY 90 tablet 1  . carvedilol (COREG) 3.125 MG tablet TAKE ONE TABLET BY MOUTH TWICE DAILY WITH A MEAL 62 tablet 0  . clopidogrel (PLAVIX) 75 MG tablet TAKE ONE TABLET BY MOUTH EVERY DAY WITH BREAKFAST 90 tablet 1  . furosemide (LASIX) 40 MG tablet TAKE ONE TABLET BY MOUTH TWICE DAILY 180 tablet 1  . gabapentin (NEURONTIN) 300 MG capsule TAKE ONE CAPSULE BY MOUTH TWICE DAILY 62 capsule 0  . glimepiride (AMARYL) 4 MG tablet Take 4 mg by mouth every morning.  1  . levothyroxine (SYNTHROID, LEVOTHROID)  75 MCG tablet TAKE ONE TABLET BY MOUTH EVERY DAY 90 tablet 0  . loratadine (CLARITIN) 10 MG tablet Take 1 tablet (10 mg total) by mouth daily. 30 tablet 11  . nitroGLYCERIN (NITROSTAT) 0.4 MG SL tablet Place 1 tablet (0.4 mg total) under the tongue every 5 (five) minutes as needed for chest pain. 30 tablet 2  . pantoprazole (PROTONIX) 40 MG tablet TAKE ONE TABLET BY MOUTH DAILY 90 tablet 1   No current facility-administered medications for this visit.     Past Medical History:  Diagnosis Date  . Cervical cancer ([place])    cryotherapy and colposcopy/[place]; 1991  . CHF (congestive heart failure) ([place]) 07-2014  . Diabetes mellitus without complication ([place]) 1610  . High cholesterol   . Hypertension   . MI (myocardial infarction) ([place])    2004  . Stroke [facility]) 09/2014, 2013    Past Surgical History:  Procedure Laterality Date  . CHOLECYSTECTOMY  1992  . CORONARY STENT PLACEMENT  2004  . TUBAL LIGATION      Social History   Socioeconomic History  . Marital status: Legally Separated    Spouse name: Not on file  . Number of children: Not on file  . Years of education: Not on file  . Highest education level: Not on file  Occupational History  . Not on file  Social Needs  . Financial resource strain: Not on file  . Food insecurity:    Worry: Not on file  Inability: Not on file  . Transportation needs:    Medical: Not on file    Non-medical: Not on file  Tobacco Use  . Smoking status: Never Smoker  . Smokeless tobacco: Never Used  Substance and Sexual Activity  . Alcohol use: No  . Drug use: No  . Sexual activity: Yes    Birth control/protection: None, Surgical  Lifestyle  . Physical activity:    Days per week: Not on file    Minutes per session: Not on file  . Stress: Not on file  Relationships  . Social connections:    Talks on phone: Not on file    Gets together: Not on file    Attends religious service: Not on file    Active member of club or  organization: Not on file    Attends meetings of clubs or organizations: Not on file    Relationship status: Not on file  . Intimate partner violence:    Fear of current or ex partner: Not on file    Emotionally abused: Not on file    Physically abused: Not on file    Forced sexual activity: Not on file  Other Topics Concern  . Not on file  Social History Narrative   Lives alone.    Has some friends.    Neighbors.      Vitals:   09/05/17 1253  BP: 130/80  Pulse: 68  SpO2: 94%  Weight: 198 lb (89.8 kg)  Height: 5\' 1"  (1.549 m)    Wt Readings from Last 3 Encounters:  09/05/17 198 lb (89.8 kg)  08/31/17 197 lb 1.3 oz (89.4 kg)  05/24/17 189 lb 12 oz (86.1 kg)     PHYSICAL EXAM General: NAD HEENT: Normal. Neck: No JVD, no thyromegaly. Lungs: Clear to auscultation bilaterally with normal respiratory effort. CV: Regular rate and rhythm, normal S1/S2, no S3/S4, no murmur. No pretibial or periankle edema.  No carotid bruit.   Abdomen: Soft, nontender, no distention.  Neurologic: Alert and oriented.  Psych: Normal affect. Skin: Normal. Musculoskeletal: No gross deformities.    ECG: Reviewed above under Subjective   Labs: Lab Results  Component Value Date/Time   K 4.2 05/24/2017 11:11 AM   BUN 40 (H) 05/24/2017 11:11 AM   CREATININE 2.18 (H) 05/24/2017 11:11 AM   ALT 12 04/20/2017 02:18 PM   TSH 3.08 04/20/2017 02:18 PM   HGB 11.0 (L) 05/24/2017 11:11 AM     Lipids: Lab Results  Component Value Date/Time   LDLCALC 148 (H) 04/20/2017 02:18 PM   CHOL 231 (H) 04/20/2017 02:18 PM   TRIG 301 (H) 04/20/2017 02:18 PM   HDL 37 (L) 04/20/2017 02:18 PM       ASSESSMENT AND PLAN:  1. Chronic combined systolic and diastolic heart failure: Euvolemic and stable. Currently on carvedilol 3.125 mg twice daily, and Lasix 40 mg twice daily.  Monica Carlson was stopped by nephrology.  BUN 40 and creatinine 2.18 on 05/24/2017. Nuclear stress test reviewed above with no evidence of  ischemia and presence of myocardial scar noted.  LVEF has improved to 40 to 45%.  2. Cardiomyopathy/severe left ventricular dysfunction: Nuclear stress test reviewed above with no evidence of ischemia and presence of myocardial scar noted. Continue carvedilol.  Entresto stopped by nephrology.   LVEF has improved to 40 to 45%.  3. Coronary artery disease with history of bare-metal stent to the RCA in 2004: Stable ischemic heart disease.  Continue aspirin, carvedilol, and statin.  Nuclear stress  test reviewed above with no evidence of ischemia.  4. Bilateral CVA: On aspirin, Plavix, and statin.  5. Chronic hypertension: Blood pressure is normal.  No changes to therapy.  6. Hyperlipidemia: Continue Lipitor.     Disposition: Follow up 6 months   Kate Sable, M.D., F.A.C.C.

## 2017-09-05 NOTE — Patient Instructions (Signed)

## 2017-09-06 ENCOUNTER — Other Ambulatory Visit: Payer: Self-pay | Admitting: Family Medicine

## 2017-10-06 ENCOUNTER — Ambulatory Visit: Payer: Medicare Other | Admitting: Family Medicine

## 2017-10-11 ENCOUNTER — Other Ambulatory Visit: Payer: Self-pay | Admitting: Family Medicine

## 2017-10-11 DIAGNOSIS — E1140 Type 2 diabetes mellitus with diabetic neuropathy, unspecified: Secondary | ICD-10-CM

## 2017-10-25 ENCOUNTER — Other Ambulatory Visit: Payer: Self-pay

## 2017-10-25 MED ORDER — CARVEDILOL 3.125 MG PO TABS: 3.1250 mg | ORAL_TABLET | Freq: Two times a day (BID) | ORAL | 1 refills | Status: DC

## 2017-10-27 ENCOUNTER — Encounter: Payer: Medicaid Other | Admitting: Adult Health

## 2017-11-02 ENCOUNTER — Encounter: Payer: Self-pay | Admitting: Nephrology

## 2017-11-02 DIAGNOSIS — N183 Chronic kidney disease, stage 3 (moderate): Secondary | ICD-10-CM | POA: Diagnosis not present

## 2017-11-02 DIAGNOSIS — I10 Essential (primary) hypertension: Secondary | ICD-10-CM | POA: Diagnosis not present

## 2017-11-02 DIAGNOSIS — R809 Proteinuria, unspecified: Secondary | ICD-10-CM | POA: Diagnosis not present

## 2017-11-02 DIAGNOSIS — E559 Vitamin D deficiency, unspecified: Secondary | ICD-10-CM | POA: Diagnosis not present

## 2017-11-02 DIAGNOSIS — D509 Iron deficiency anemia, unspecified: Secondary | ICD-10-CM | POA: Diagnosis not present

## 2017-11-02 DIAGNOSIS — Z79899 Other long term (current) drug therapy: Secondary | ICD-10-CM | POA: Diagnosis not present

## 2017-11-03 DIAGNOSIS — E113521 Type 2 diabetes mellitus with proliferative diabetic retinopathy with traction retinal detachment involving the macula, right eye: Secondary | ICD-10-CM | POA: Diagnosis not present

## 2017-11-03 DIAGNOSIS — E113542 Type 2 diabetes mellitus with proliferative diabetic retinopathy with combined traction retinal detachment and rhegmatogenous retinal detachment, left eye: Secondary | ICD-10-CM | POA: Diagnosis not present

## 2017-11-03 DIAGNOSIS — H4313 Vitreous hemorrhage, bilateral: Secondary | ICD-10-CM | POA: Diagnosis not present

## 2017-11-03 DIAGNOSIS — H3582 Retinal ischemia: Secondary | ICD-10-CM | POA: Diagnosis not present

## 2017-11-08 ENCOUNTER — Telehealth: Payer: Self-pay | Admitting: Family Medicine

## 2017-11-08 NOTE — Telephone Encounter (Signed)
Please advised that we hope your surgery goes well.  I am glad that cardiology has cleared her for surgery.  Patient has been excited about getting some help to improve her vision.

## 2017-11-08 NOTE — Telephone Encounter (Signed)
Monica Carlson- patients social worker called to let you know that patient is having eye surgery tomorrow and the eye doctor at [facility] is requesting if you have any concerns about her having surgery. He states the cardiologist has already cleared her for surgery Cb#: [phone]

## 2017-11-09 DIAGNOSIS — H3341 Traction detachment of retina, right eye: Secondary | ICD-10-CM | POA: Diagnosis not present

## 2017-11-09 DIAGNOSIS — H3581 Retinal edema: Secondary | ICD-10-CM | POA: Diagnosis not present

## 2017-11-09 DIAGNOSIS — H4311 Vitreous hemorrhage, right eye: Secondary | ICD-10-CM | POA: Diagnosis not present

## 2017-11-09 DIAGNOSIS — E11311 Type 2 diabetes mellitus with unspecified diabetic retinopathy with macular edema: Secondary | ICD-10-CM | POA: Diagnosis not present

## 2017-11-09 DIAGNOSIS — E113521 Type 2 diabetes mellitus with proliferative diabetic retinopathy with traction retinal detachment involving the macula, right eye: Secondary | ICD-10-CM | POA: Diagnosis not present

## 2017-11-09 DIAGNOSIS — H35371 Puckering of macula, right eye: Secondary | ICD-10-CM | POA: Diagnosis not present

## 2017-12-12 ENCOUNTER — Other Ambulatory Visit: Payer: Self-pay | Admitting: Family Medicine

## 2017-12-12 DIAGNOSIS — E039 Hypothyroidism, unspecified: Secondary | ICD-10-CM

## 2017-12-13 ENCOUNTER — Encounter: Payer: Medicare Other | Attending: Nephrology | Admitting: Registered"

## 2017-12-13 ENCOUNTER — Encounter: Payer: Self-pay | Admitting: Registered"

## 2017-12-13 DIAGNOSIS — E1165 Type 2 diabetes mellitus with hyperglycemia: Secondary | ICD-10-CM

## 2017-12-13 DIAGNOSIS — Z713 Dietary counseling and surveillance: Secondary | ICD-10-CM | POA: Diagnosis not present

## 2017-12-13 DIAGNOSIS — E119 Type 2 diabetes mellitus without complications: Secondary | ICD-10-CM | POA: Insufficient documentation

## 2017-12-13 DIAGNOSIS — E669 Obesity, unspecified: Secondary | ICD-10-CM | POA: Diagnosis not present

## 2017-12-13 NOTE — Patient Instructions (Addendum)
Follow-up looking into seeing Dr. Dorris Fetch as soon as possible Consider following up with the dietitian in [facility] Whenever eating a carbohydrate also include a protein, use the handout for guidance Consider cutting out your juice and sweetened beverages, more water can use sugar-free drop-ins. Try the tea with splenda. Aim to eat more non-starchy vegetables You can still eat the starchy vegetables, but remember to keep the portion size to the quarter plate.

## 2017-12-13 NOTE — Progress Notes (Signed)
Diabetes Self-Management Education  Visit Type: First/Initial  Appt. Start Time: 1410 Appt. End Time: 7867  12/13/2017  [name], identified by name and date of birth, is a 52 y.o. female with a diagnosis of Diabetes: Type 2.   Patient is accompanied by Monica Carlson from [facility].  ASSESSMENT Patient lives in [place], was seen at [facility] instead of Monica Carlson because her health care advocate wanted her to be seen as soon as possible. Pt will follow-up with [place] dietitian for future visits.   Pt states 4-5 months she went to the hospital with low blood sugar and that is when  her kidneys issue was discovered. Pt states after hospital discharge she was seen at the kidney clinic and she was taken off most of her diabetes medications. Per referral notes, she was instructed to follow-up with PCP due to DM medication change. Pt states her PCP, Monica Carlson, left the practice and the patient not found another PCP to continue care.  Pt states she only checks her BG 1x day, before dinner and it runs in the 300's. Prior to this medication change it was closer to 120s. Las A1c lab in Epic 7 months ago was 5.7%.  Monica Carlson states that her A1c was 12.7% in Sept 2018, and he states he has been asking about getting her in to see endocrinologist, Monica Carlson. RD agreed that would be a good idea.  Pt states she doesn't like milk, only likes a few vegetables including carrots, lettuce, peas and corn. Patient may not understand the concept of starchy vs non-starchy vegetable and likely needs reinforcement education at follow-up  Patient reports limited vision, and when showing her a 8 1/2 x 11 picture of MyPlate she reported only being able to see colors. Monica Carlson states that patient's impaired vision is due to retinal damage which had to be retina had to be reattached and after she is healed she needs to have cataract surgery.  Pt reports concern because water makes her  throat feel dry, can only drink 1/2 bottle of Dasani water. RD said the irritation might be coming from seasonal allergies, but told patient that she needs to follow-up with doctor to see what might be causing dry throat symptoms.  Patient may not be ready to give up sweet tea and soda at this point, RD tried to help problem solve to find other beverage options.  Diabetes Self-Management Education - 12/13/17 1426      Visit Information   Visit Type  First/Initial      Initial Visit   Diabetes Type  Type 2    Are you currently following a meal plan?  No    Are you taking your medications as prescribed?  Yes   glimepiride   Date Diagnosed  2000 after GDM       Health Coping   How would you rate your overall health?  Fair      Psychosocial Assessment   Patient Belief/Attitude about Diabetes  Motivated to manage diabetes    How often do you need to have someone help you when you read instructions, pamphlets, or other written materials from your doctor or pharmacy?  5 - Always    What is the last grade level you completed in school?  12      Complications   Last HgB A1C per patient/outside source  5.7 %    How often do you check your blood sugar?  1-2 times/day    Fasting  Blood glucose range (mg/dL)  >200    Have you had a dilated eye exam in the past 12 months?  Yes    Have you had a dental exam in the past 12 months?  No    Are you checking your feet?  Yes    How many days per week are you checking your feet?  1      Dietary Intake   Breakfast  none OR 1 egg, toast    Snack (morning)  none    Lunch  ham sandwich,     Snack (afternoon)  none    Dinner  baked chicken, mac & cheese    Snack (evening)  none    Beverage(s)  16 oz water, sweet tea, regular soda, diet dr. Malachi Carlson      Exercise   Exercise Type  Light (walking / raking leaves)    How many days per week to you exercise?  15    How many minutes per day do you exercise?  15    Total minutes per week of exercise  225       Patient Education   Previous Diabetes Education  No    Nutrition management   Role of diet in the treatment of diabetes and the relationship between the three main macronutrients and blood glucose level    Physical activity and exercise   Role of exercise on diabetes management, blood pressure control and cardiac health.      Individualized Goals (developed by patient)   Nutrition  General guidelines for healthy choices and portions discussed    Physical Activity  Exercise 3-5 times per week    Reducing Risk  increase portions of nuts and seeds      Outcomes   Expected Outcomes  Demonstrated interest in learning. Expect positive outcomes    Future DMSE  4-6 wks    Program Status  Not Completed      Individualized Plan for Diabetes Self-Management Training:   Learning Objective:  Patient will have a greater understanding of diabetes self-management. Patient education plan is to attend individual and/or group sessions per assessed needs and concerns.  Patient Instructions  Follow-up looking into seeing Monica Carlson as soon as possible Consider following up with the dietitian in [facility] Whenever eating a carbohydrate also include a protein, use the handout for guidance Consider cutting out your juice and sweetened beverages, more water can use sugar-free drop-ins. Try the tea with splenda. Aim to eat more non-starchy vegetables You can still eat the starchy vegetables, but remember to keep the portion size to the quarter plate.  Expected Outcomes:  Demonstrated interest in learning. Expect positive outcomes  Education material provided: Planning Healthy Meals, Foot Care, Snack Sheet, 1500 calorie meal plan (novonordisk) just for general guidance, not calorie counting.  If problems or questions, patient to contact team via:  Phone  Future DSME appointment: 4-6 wks

## 2017-12-19 ENCOUNTER — Ambulatory Visit (INDEPENDENT_AMBULATORY_CARE_PROVIDER_SITE_OTHER): Payer: Medicare Other | Admitting: Adult Health

## 2017-12-19 ENCOUNTER — Encounter: Payer: Self-pay | Admitting: Adult Health

## 2017-12-19 ENCOUNTER — Other Ambulatory Visit (HOSPITAL_COMMUNITY)
Admission: RE | Admit: 2017-12-19 | Discharge: 2017-12-19 | Disposition: A | Discharge status: Home / Self Care | Payer: Medicare Other | Source: Ambulatory Visit | Attending: Adult Health | Admitting: Adult Health

## 2017-12-19 ENCOUNTER — Other Ambulatory Visit: Payer: Self-pay

## 2017-12-19 VITALS — BP 154/82 | HR 75 | Ht 61.0 in | Wt 197.0 lb

## 2017-12-19 DIAGNOSIS — I2510 Atherosclerotic heart disease of native coronary artery without angina pectoris: Secondary | ICD-10-CM

## 2017-12-19 DIAGNOSIS — Z124 Encounter for screening for malignant neoplasm of cervix: Secondary | ICD-10-CM | POA: Diagnosis not present

## 2017-12-19 DIAGNOSIS — Z1212 Encounter for screening for malignant neoplasm of rectum: Secondary | ICD-10-CM

## 2017-12-19 DIAGNOSIS — Z01419 Encounter for gynecological examination (general) (routine) without abnormal findings: Secondary | ICD-10-CM | POA: Diagnosis not present

## 2017-12-19 DIAGNOSIS — Z1211 Encounter for screening for malignant neoplasm of colon: Secondary | ICD-10-CM

## 2017-12-19 LAB — HEMOCCULT GUIAC POC 1CARD (OFFICE): Fecal Occult Blood, POC: NEGATIVE

## 2017-12-19 NOTE — Progress Notes (Signed)
  Subjective:     Patient ID: Monica Carlson, female   DOB: 1965/08/09, 52 y.o.   MRN: [id number]  HPI [name] is a 52 year old white female, separated in for pelvic exam and pap.She had physical with Dr Mannie Stabile in July 2019.She is walking with cane today, she has retinal issue and has oil in eye and can't see well. She said she had cervical cancer in about 1991 and her cervix was burned.  PCP is Dr Mannie Stabile.   Review of Systems Patient denies any recent headaches, hearing loss, fatigue, blurred vision, shortness of breath, chest pain, abdominal pain, problems with bowel movements, urination, or intercourse. No joint pain or mood swings.  Reviewed past medical,surgical, social and family history. Reviewed medications and allergies.     Objective:   Physical Exam BP (!) 154/82 (BP Location: Right Arm, Patient Position: Sitting, Cuff Size: Normal)   Pulse 75   Ht 5\' 1"  (1.549 m)   Wt 197 lb (89.4 kg)   BMI 37.22 kg/m    PHQ 2 score 0. Skin warm and dry.Pelvic: external genitalia is normal in appearance no lesions, vagina:pale with loss of rugae, moisture is fair,urethra has no lesions or masses noted, cervix:smooth and bulbous, pap with HPV performed, uterus: normal size, shape and contour, non tender, no masses felt, adnexa: no masses or tenderness noted. Bladder is non tender and no masses felt. On rectal exam has good tone, no masses felt and hemoccult was negative.  Assessment:     1. Encounter for gynecological examination with Papanicolaou smear of cervix   2. Routine cervical smear   3. Screening for colorectal cancer       Plan:     Pelvic and pap in 2 years Get mammogram

## 2017-12-20 ENCOUNTER — Other Ambulatory Visit: Payer: Self-pay | Admitting: Family Medicine

## 2017-12-20 DIAGNOSIS — E039 Hypothyroidism, unspecified: Secondary | ICD-10-CM

## 2017-12-22 LAB — CYTOLOGY - PAP
Adequacy: ABSENT
Chlamydia: NEGATIVE
Diagnosis: NEGATIVE
HPV: NOT DETECTED
Neisseria Gonorrhea: NEGATIVE

## 2018-01-10 ENCOUNTER — Other Ambulatory Visit: Payer: Self-pay | Admitting: Family Medicine

## 2018-01-10 DIAGNOSIS — E1140 Type 2 diabetes mellitus with diabetic neuropathy, unspecified: Secondary | ICD-10-CM

## 2018-01-17 ENCOUNTER — Ambulatory Visit: Payer: Medicare Other | Admitting: Nutrition

## 2018-01-26 ENCOUNTER — Encounter: Payer: Self-pay | Admitting: "Endocrinology

## 2018-01-26 DIAGNOSIS — N289 Disorder of kidney and ureter, unspecified: Secondary | ICD-10-CM | POA: Diagnosis not present

## 2018-01-26 DIAGNOSIS — I10 Essential (primary) hypertension: Secondary | ICD-10-CM | POA: Diagnosis not present

## 2018-01-26 DIAGNOSIS — Z6834 Body mass index (BMI) 34.0-34.9, adult: Secondary | ICD-10-CM | POA: Diagnosis not present

## 2018-01-26 DIAGNOSIS — E039 Hypothyroidism, unspecified: Secondary | ICD-10-CM | POA: Diagnosis not present

## 2018-01-26 DIAGNOSIS — E669 Obesity, unspecified: Secondary | ICD-10-CM | POA: Diagnosis not present

## 2018-01-26 DIAGNOSIS — E559 Vitamin D deficiency, unspecified: Secondary | ICD-10-CM | POA: Diagnosis not present

## 2018-01-26 DIAGNOSIS — E1129 Type 2 diabetes mellitus with other diabetic kidney complication: Secondary | ICD-10-CM | POA: Diagnosis not present

## 2018-02-12 ENCOUNTER — Telehealth: Payer: Self-pay | Admitting: Cardiovascular Disease

## 2018-02-12 NOTE — Telephone Encounter (Signed)
Awaiting surgical clearance request information via fax.

## 2018-02-12 NOTE — Telephone Encounter (Signed)
Received telephone call from [facility] with [place] [phone]) ext [phone] stating that patient needs to have pre op clearance for upcoming surgery with : Dr. Corene [facility] Address: [address], [place], [place] [address] Phone: [phone]  Spoke with [facility]) coordinator. She is going to fax over clearance form.

## 2018-03-01 DIAGNOSIS — I10 Essential (primary) hypertension: Secondary | ICD-10-CM | POA: Diagnosis not present

## 2018-03-01 DIAGNOSIS — E1129 Type 2 diabetes mellitus with other diabetic kidney complication: Secondary | ICD-10-CM | POA: Diagnosis not present

## 2018-03-01 DIAGNOSIS — E039 Hypothyroidism, unspecified: Secondary | ICD-10-CM | POA: Diagnosis not present

## 2018-03-01 DIAGNOSIS — N289 Disorder of kidney and ureter, unspecified: Secondary | ICD-10-CM | POA: Diagnosis not present

## 2018-03-19 DIAGNOSIS — T85398A Other mechanical complication of other ocular prosthetic devices, implants and grafts, initial encounter: Secondary | ICD-10-CM | POA: Diagnosis not present

## 2018-03-19 DIAGNOSIS — T85898A Other specified complication of other internal prosthetic devices, implants and grafts, initial encounter: Secondary | ICD-10-CM | POA: Diagnosis not present

## 2018-03-19 DIAGNOSIS — H35371 Puckering of macula, right eye: Secondary | ICD-10-CM | POA: Diagnosis not present

## 2018-03-19 DIAGNOSIS — H3581 Retinal edema: Secondary | ICD-10-CM | POA: Diagnosis not present

## 2018-03-19 DIAGNOSIS — E113511 Type 2 diabetes mellitus with proliferative diabetic retinopathy with macular edema, right eye: Secondary | ICD-10-CM | POA: Diagnosis not present

## 2018-03-27 DIAGNOSIS — E113591 Type 2 diabetes mellitus with proliferative diabetic retinopathy without macular edema, right eye: Secondary | ICD-10-CM | POA: Diagnosis not present

## 2018-04-12 DIAGNOSIS — G4700 Insomnia, unspecified: Secondary | ICD-10-CM | POA: Diagnosis not present

## 2018-04-12 DIAGNOSIS — E039 Hypothyroidism, unspecified: Secondary | ICD-10-CM | POA: Diagnosis not present

## 2018-04-23 DIAGNOSIS — H33052 Total retinal detachment, left eye: Secondary | ICD-10-CM | POA: Diagnosis not present

## 2018-04-23 DIAGNOSIS — H2513 Age-related nuclear cataract, bilateral: Secondary | ICD-10-CM | POA: Diagnosis not present

## 2018-04-23 DIAGNOSIS — H2511 Age-related nuclear cataract, right eye: Secondary | ICD-10-CM | POA: Diagnosis not present

## 2018-04-23 DIAGNOSIS — H25013 Cortical age-related cataract, bilateral: Secondary | ICD-10-CM | POA: Diagnosis not present

## 2018-04-23 DIAGNOSIS — H25043 Posterior subcapsular polar age-related cataract, bilateral: Secondary | ICD-10-CM | POA: Diagnosis not present

## 2018-04-25 ENCOUNTER — Ambulatory Visit: Payer: Medicare Other | Admitting: Cardiovascular Disease

## 2018-05-07 ENCOUNTER — Ambulatory Visit: Payer: Medicare Other | Admitting: "Endocrinology

## 2018-05-09 ENCOUNTER — Ambulatory Visit: Payer: Medicare Other | Admitting: "Endocrinology

## 2018-05-14 ENCOUNTER — Ambulatory Visit: Payer: Medicare Other | Admitting: "Endocrinology

## 2018-06-04 ENCOUNTER — Ambulatory Visit: Payer: Medicare Other | Admitting: "Endocrinology

## 2018-06-05 ENCOUNTER — Ambulatory Visit: Payer: Medicare Other | Admitting: "Endocrinology

## 2018-06-05 ENCOUNTER — Other Ambulatory Visit: Payer: Self-pay

## 2018-07-05 ENCOUNTER — Ambulatory Visit: Payer: Medicare Other | Admitting: "Endocrinology

## 2018-07-09 ENCOUNTER — Encounter: Payer: Self-pay | Admitting: "Endocrinology

## 2018-07-09 ENCOUNTER — Other Ambulatory Visit: Payer: Self-pay

## 2018-07-09 ENCOUNTER — Ambulatory Visit (INDEPENDENT_AMBULATORY_CARE_PROVIDER_SITE_OTHER): Payer: Medicare Other | Admitting: "Endocrinology

## 2018-07-09 VITALS — BP 145/85 | HR 67 | Ht 61.0 in | Wt 203.0 lb

## 2018-07-09 DIAGNOSIS — E038 Other specified hypothyroidism: Secondary | ICD-10-CM | POA: Diagnosis not present

## 2018-07-09 DIAGNOSIS — E119 Type 2 diabetes mellitus without complications: Secondary | ICD-10-CM | POA: Diagnosis not present

## 2018-07-09 DIAGNOSIS — E063 Autoimmune thyroiditis: Secondary | ICD-10-CM | POA: Diagnosis not present

## 2018-07-09 MED ORDER — ACCU-CHEK GUIDE ME W/DEVICE KIT: 1.0000 | PACK | 0 refills | Status: AC

## 2018-07-09 MED ORDER — GLUCOSE BLOOD VI STRP: ORAL_STRIP | 2 refills | Status: DC

## 2018-07-09 MED ORDER — GLIPIZIDE ER 5 MG PO TB24: 5.0000 mg | ORAL_TABLET | Freq: Every day | ORAL | 3 refills | Status: DC

## 2018-07-09 NOTE — Progress Notes (Signed)
Endocrinology Consult Note       07/09/2018, 4:25 PM   Subjective:    Patient ID: Monica Carlson, female    DOB: Mar 18, 1965.  Monica Carlson is being seen in consultation for management of currently uncontrolled symptomatic diabetes requested by  Patient, No Pcp Per.   Past Medical History:  Diagnosis Date  . Cervical cancer ([place])    cryotherapy and colposcopy/[place]; 1991  . CHF (congestive heart failure) ([place]) 07-2014  . Diabetes mellitus without complication ([place]) 9672  . High cholesterol   . Hypertension   . MI (myocardial infarction) ([place])    2004  . Stroke ([place]) 09/2014, 2013  . Vaginal Pap smear, abnormal     Past Surgical History:  Procedure Laterality Date  . CHOLECYSTECTOMY  1992  . CORONARY STENT PLACEMENT  2004  . TUBAL LIGATION      Social History   Socioeconomic History  . Marital status: Legally Separated    Spouse name: Not on file  . Number of children: Not on file  . Years of education: Not on file  . Highest education level: Not on file  Occupational History  . Not on file  Social Needs  . Financial resource strain: Not on file  . Food insecurity:    Worry: Not on file    Inability: Not on file  . Transportation needs:    Medical: Not on file    Non-medical: Not on file  Tobacco Use  . Smoking status: Never Smoker  . Smokeless tobacco: Never Used  Substance and Sexual Activity  . Alcohol use: No  . Drug use: No  . Sexual activity: Yes    Birth control/protection: None, Surgical    Comment: tubal  Lifestyle  . Physical activity:    Days per week: Not on file    Minutes per session: Not on file  . Stress: Not on file  Relationships  . Social connections:    Talks on phone: Not on file    Gets together: Not on file    Attends religious service: Not on file    Active member of club or organization: Not on file    Attends meetings of clubs  or organizations: Not on file    Relationship status: Not on file  Other Topics Concern  . Not on file  Social History Narrative   Lives alone.    Has some friends.    Neighbors.     Family History  Problem Relation Age of Onset  . Migraines Mother   . Heart disease Father   . Hypertension Father   . Diabetes Father   . Migraines Maternal Grandfather   . Heart attack Maternal Grandfather   . Lung cancer Paternal Grandmother   . Colon cancer Neg Hx   . Colon polyps Neg Hx     Outpatient Encounter Medications as of 07/09/2018  Medication Sig  . amLODipine (NORVASC) 5 MG tablet Take 5 mg by mouth daily.  Marland Kitchen aspirin 325 MG EC tablet Take 1 tablet (325 mg total) by mouth daily.  Marland Kitchen atorvastatin (LIPITOR) 80 MG tablet TAKE ONE TABLET  BY MOUTH DAILY  . carvedilol (COREG) 3.125 MG tablet Take 1 tablet (3.125 mg total) by mouth 2 (two) times daily with a meal.  . clopidogrel (PLAVIX) 75 MG tablet TAKE ONE TABLET BY MOUTH EVERY DAY WITH BREAKFAST  . furosemide (LASIX) 40 MG tablet TAKE ONE TABLET BY MOUTH TWICE DAILY  . gabapentin (NEURONTIN) 300 MG capsule TAKE ONE CAPSULE BY MOUTH TWICE DAILY  . levothyroxine (SYNTHROID, LEVOTHROID) 75 MCG tablet TAKE ONE TABLET BY MOUTH EVERY DAY  . pantoprazole (PROTONIX) 40 MG tablet TAKE ONE TABLET BY MOUTH DAILY  . Vitamin D, Ergocalciferol, (DRISDOL) 50000 units CAPS capsule TAKE ONE CAPSULE BY MOUTH ONCE WEEKLY ON MONDAY  . [DISCONTINUED] glimepiride (AMARYL) 4 MG tablet TAKE ONE TABLET BY MOUTH DAILY BEFORE breakfast  . Blood Glucose Monitoring Suppl (ACCU-CHEK GUIDE ME) w/Device KIT 1 Piece by Does not apply route as directed.  Marland Kitchen glipiZIDE (GLUCOTROL XL) 5 MG 24 hr tablet Take 1 tablet (5 mg total) by mouth daily with breakfast.  . glucose blood (ACCU-CHEK GUIDE) test strip Use as instructed  . nitroGLYCERIN (NITROSTAT) 0.4 MG SL tablet Place 1 tablet (0.4 mg total) under the tongue every 5 (five) minutes as needed for chest pain.  .  [DISCONTINUED] loratadine (CLARITIN) 10 MG tablet Take 1 tablet (10 mg total) by mouth daily.  . [DISCONTINUED] oxyCODONE-acetaminophen (PERCOCET/ROXICET) 5-325 MG tablet Take 1 tablet by mouth every 6 (six) hours as needed. for pain   No facility-administered encounter medications on file as of 07/09/2018.     ALLERGIES: Allergies  Allergen Reactions  . [place]edar     Swelling   . Other     Teriyaki- swelling    VACCINATION STATUS: Immunization History  Administered Date(s) Administered  . Tdap 04/20/2017    Diabetes  She presents for her initial diabetic visit. She has type 2 diabetes mellitus. Onset time: She was diagnosed at approximate age of 53 years. Her disease course has been improving. Hypoglycemia symptoms include nervousness/anxiousness, sweats and tremors. Pertinent negatives for hypoglycemia include no confusion, headaches, pallor or seizures. There are no diabetic associated symptoms. Pertinent negatives for diabetes include no chest pain, no polydipsia, no polyphagia and no polyuria. Hypoglycemia complications include required assistance. Symptoms are improving. Diabetic complications include heart disease and nephropathy. Risk factors for coronary artery disease include diabetes mellitus, dyslipidemia, family history, hypertension, sedentary lifestyle and post-menopausal. Current diabetic treatments: She is currently on glimepiride 4 mg p.o. daily. Her weight is increasing steadily. She is following a generally unhealthy diet. When asked about meal planning, she reported none. She has not had a previous visit with a dietitian. She never participates in exercise. (She does not monitor blood glucose regularly.  Her A1c was found to be 5.7% in February 2019.  She has no recent a1c  to review.) An ACE inhibitor/angiotensin II receptor blocker is not being taken.  Hyperlipidemia  This is a chronic problem. The current episode started more than 1 year ago. The problem is uncontrolled.  Exacerbating diseases include diabetes, hypothyroidism and obesity. Pertinent negatives include no chest pain, myalgias or shortness of breath. Current antihyperlipidemic treatment includes statins. Risk factors for coronary artery disease include dyslipidemia, diabetes mellitus, hypertension, obesity, family history, a sedentary lifestyle and post-menopausal.  Hypertension  This is a chronic problem. The current episode started more than 1 year ago. Associated symptoms include sweats. Pertinent negatives include no chest pain, headaches, palpitations or shortness of breath. Risk factors for coronary artery disease include dyslipidemia, diabetes mellitus, obesity,  post-menopausal state and sedentary lifestyle. Past treatments include calcium channel blockers and beta blockers. Hypertensive end-organ damage includes kidney disease and CAD/MI.     Review of Systems  Constitutional: Negative for chills, fever and unexpected weight change.  HENT: Negative for trouble swallowing and voice change.   Eyes: Negative for visual disturbance.  Respiratory: Negative for cough, shortness of breath and wheezing.   Cardiovascular: Negative for chest pain, palpitations and leg swelling.  Gastrointestinal: Negative for diarrhea, nausea and vomiting.  Endocrine: Negative for cold intolerance, heat intolerance, polydipsia, polyphagia and polyuria.  Musculoskeletal: Negative for arthralgias and myalgias.  Skin: Negative for color change, pallor, rash and wound.  Neurological: Positive for tremors. Negative for seizures and headaches.  Psychiatric/Behavioral: Negative for confusion and suicidal ideas. The patient is nervous/anxious.     Objective:    BP (!) 145/85   Pulse 67   Ht '5\' 1"'  (1.549 m)   Wt 203 lb (92.1 kg)   BMI 38.36 kg/m   Wt Readings from Last 3 Encounters:  07/09/18 203 lb (92.1 kg)  12/19/17 197 lb (89.4 kg)  09/05/17 198 lb (89.8 kg)     Physical Exam Constitutional:      Appearance:  She is well-developed.  HENT:     Head: Normocephalic and atraumatic.  Neck:     Musculoskeletal: Normal range of motion and neck supple.     Thyroid: No thyromegaly.     Trachea: No tracheal deviation.  Pulmonary:     Effort: Pulmonary effort is normal.  Abdominal:     Tenderness: There is no abdominal tenderness. There is no guarding.  Musculoskeletal: Normal range of motion.  Skin:    General: Skin is warm and dry.     Coloration: Skin is not pale.     Findings: No erythema or rash.  Neurological:     Mental Status: She is alert and oriented to person, place, and time.     Cranial Nerves: No cranial nerve deficit.     Coordination: Coordination normal.     Deep Tendon Reflexes: Reflexes are normal and symmetric.  Psychiatric:        Mood and Affect: Mood normal.     CMP     Component Value Date/Time   NA 141 05/24/2017 1111   K 4.2 05/24/2017 1111   CL 97 (L) 05/24/2017 1111   CO2 33 (H) 05/24/2017 1111   GLUCOSE 57 (L) 05/24/2017 1111   BUN 40 (H) 05/24/2017 1111   CREATININE 2.18 (H) 05/24/2017 1111   CALCIUM 9.3 05/24/2017 1111   PROT 7.6 04/20/2017 1418   ALBUMIN 2.9 (L) 10/07/2016 0213   AST 13 04/20/2017 1418   ALT 12 04/20/2017 1418   ALKPHOS 55 10/07/2016 0213   BILITOT 0.5 04/20/2017 1418   GFRNONAA >60 10/14/2016 0606   GFRNONAA >89 10/23/2015 1008   GFRAA >60 10/14/2016 0606   GFRAA >89 10/23/2015 1008     Diabetic Labs (most recent): Lab Results  Component Value Date   HGBA1C 5.7 (H) 04/20/2017   HGBA1C 7.0 (H) 01/16/2017   HGBA1C 9.9 (H) 10/07/2016     Lipid Panel ( most recent) Lipid Panel     Component Value Date/Time   CHOL 231 (H) 04/20/2017 1418   TRIG 301 (H) 04/20/2017 1418   HDL 37 (L) 04/20/2017 1418   CHOLHDL 6.2 (H) 04/20/2017 1418   VLDL 20 10/08/2016 0628   LDLCALC 148 (H) 04/20/2017 1418      Lab Results  Component  Value Date   TSH 3.08 04/20/2017   TSH 5.98 (H) 01/16/2017   TSH 7.08 (H) 11/15/2016   TSH 17.574  (H) 10/07/2016   TSH 9.64 (H) 10/23/2015   TSH 7.43 (H) 05/22/2015      Assessment & Plan:   1. Diabetes mellitus without complication ([place])   - Monica Carlson has currently controlled symptomatic type 2 DM since  53 years of age,  with most recent A1c of 5.7 %.  She does not have recent A1c, however she has complained of hypoglycemia.   - I had a long discussion with her about the progressive nature of diabetes and the pathology behind its complications. -her diabetes is complicated by coronary artery disease, nephropathy , obesity/sedentary life and she remains at a high risk for more acute and chronic complications which include CAD, CVA, CKD, retinopathy, and neuropathy. These are all discussed in detail with her.  - I have counseled her on diet management and weight loss, by adopting a carbohydrate restricted/protein rich diet. - she admits that there is a room for improvement in her food and drink choices. - Suggestion is made for her to avoid simple carbohydrates  from her diet including Cakes, Sweet Desserts, Ice Cream, Soda (diet and regular), Sweet Tea, Candies, Chips, Cookies, Store Bought Juices, Alcohol in Excess of  1-2 drinks a day, Artificial Sweeteners,  Coffee Creamer, and "Sugar-free" Products. This will help patient to have more stable blood glucose profile and potentially avoid unintended weight gain.  - I encouraged her to switch to  unprocessed or minimally processed complex starch and increased protein intake (animal or plant source), fruits, and vegetables.  - she is advised to stick to a routine mealtimes to eat 3 meals  a day and avoid unnecessary snacks ( to snack only to correct hypoglycemia).   - she will be scheduled with Jearld Fenton, RDN, CDE for individualized diabetes education.  - I have approached her with the following individualized plan to manage diabetes and patient agrees:   - she will benefit from a safer and simplified treatment regimen,  does not need insulin treatment at this time.  I discussed with her to discontinue glimepiride.   -She will start monitoring blood glucose twice a day-daily before breakfast and at bedtime.   -I discussed and added glipizide 5 mg XL at breakfast only.   -She is encouraged to call clinic for readings below 70 or greater than 300 mg per DL. - she is not a candidate for metformin, SGLT2 inhibitors due to concurrent renal insufficiency.  - she will be considered for incretin therapy as appropriate next visit.  - Patient specific target  A1c;  LDL, HDL, Triglycerides, and  Waist Circumference were discussed in detail.  2) Blood Pressure /Hypertension:  her blood pressure is uncontrolled to target.   she is advised to continue her current medications including carvedilol 3.125 mg twice daily, amlodipine 5 mg p.o. daily.   3) Lipids/Hyperlipidemia:   Review of her recent lipid panel showed uncontrolled  LDL at 148 .  she  is advised to continue    atorvastatin 80 mg daily at bedtime.  Side effects and precautions discussed with her.  4)  Weight/Diet:  Body mass index is 38.36 kg/m.  -   clearly complicating her diabetes care.  I discussed with her the fact that loss of 5 - 10% of her  current body weight will have the most impact on her diabetes management.  CDE Consult will  be initiated . Exercise, and detailed carbohydrates information provided  -  detailed on discharge instructions.  5) hypothyroidism: Her most recent TSH was on target at 3.08.  She is advised to continue levothyroxine 75 mcg p.o. every morning.  - We discussed about the correct intake of her thyroid hormone, on empty stomach at fasting, with water, separated by at least 30 minutes from breakfast and other medications,  and separated by more than 4 hours from calcium, iron, multivitamins, acid reflux medications (PPIs). -Patient is made aware of the fact that thyroid hormone replacement is needed for life, dose to be adjusted by  periodic monitoring of thyroid function tests.   6) Chronic Care/Health Maintenance:  -she  is on  Statin medications and  is encouraged to initiate and continue to follow up with Ophthalmology, Dentist,  Podiatrist at least yearly or according to recommendations, and advised to   stay away from smoking. I have recommended yearly flu vaccine and pneumonia vaccine at least every 5 years; moderate intensity exercise for up to 150 minutes weekly; and  sleep for at least 7 hours a day.  - she is  advised to maintain close follow up with Patient, No Pcp Per for primary care needs, as well as her other providers for optimal and coordinated care.  - Time spent with the patient: 45 minutes, of which >50% was spent in obtaining information about her symptoms, reviewing her previous labs/studies, evaluations, and treatments, counseling her about her currently symptomatic hypoglycemia from overdose of sulfonylureas, hypertension, hypothyroidism, hyperlipidemia, and developing plans for long term treatment based on the latest standards of care/guidelines.  Please refer to " Patient Self Inventory" in the Media  tab for reviewed elements of pertinent patient history.  Monica Carlson participated in the discussions, expressed understanding, and voiced agreement with the above plans.  All questions were answered to her satisfaction. she is encouraged to contact clinic should she have any questions or concerns prior to her return visit.  Follow up plan: - Return in about 3 months (around 10/09/2018) for Follow up with Pre-visit Labs, Meter, and Logs.  Glade Lloyd, MD [facility] [address] [place], [place] [address] Phone: [phone]  Fax: [phone]    07/09/2018, 4:25 PM  This note was partially dictated with voice recognition software. Similar sounding words can be transcribed inadequately or may not  be corrected upon review.

## 2018-07-09 NOTE — Patient Instructions (Signed)

## 2018-07-10 ENCOUNTER — Other Ambulatory Visit: Payer: Self-pay

## 2018-07-10 MED ORDER — GLUCOSE BLOOD VI STRP: ORAL_STRIP | 2 refills | Status: AC

## 2018-07-18 DIAGNOSIS — I10 Essential (primary) hypertension: Secondary | ICD-10-CM | POA: Diagnosis not present

## 2018-07-18 DIAGNOSIS — E039 Hypothyroidism, unspecified: Secondary | ICD-10-CM | POA: Diagnosis not present

## 2018-07-18 DIAGNOSIS — E1129 Type 2 diabetes mellitus with other diabetic kidney complication: Secondary | ICD-10-CM | POA: Diagnosis not present

## 2018-07-19 ENCOUNTER — Encounter: Payer: Medicare Other | Attending: Family | Admitting: Nutrition

## 2018-07-19 ENCOUNTER — Encounter: Payer: Self-pay | Admitting: Nutrition

## 2018-07-19 ENCOUNTER — Other Ambulatory Visit: Payer: Self-pay

## 2018-07-19 DIAGNOSIS — R739 Hyperglycemia, unspecified: Secondary | ICD-10-CM

## 2018-07-19 DIAGNOSIS — E669 Obesity, unspecified: Secondary | ICD-10-CM | POA: Insufficient documentation

## 2018-07-19 NOTE — Patient Instructions (Addendum)
Goals Follow MY Plate 50% of plate low carb vegetable Increase fruit with meals. Cut out sweet tea and drink only water Walk to mailbox 3 times per day. Lose 1-2 lbs per week.

## 2018-07-19 NOTE — Progress Notes (Signed)
  Medical Nutrition Therapy:  Appt start time: 1300 end time:  1400.   Assessment:  Primary concerns today: pre Dm and Obesity  Lives by herself. Her  Neighbor takes her to the store at times. Marland Kitchen Heart attack in 2004 and history of  X 5.  Isn't using any salt now. Boiled and bakes her foods. Doesn't like a lot of vegetables but does like some fruit. Typically has been skipping lunch and only  2 eating meals per day. Drinks sweet tea and working on reducing sugar in it and drinking more water Isn't exercising but willing to start walking to mailbox and back. Willing to work on losing weight.  Goals Follow MY Plate 50% of plate low carb vegetable Increase fruit with meals. Cut out sweet tea and drink only water Walk to mailbox 3 times per day. Lose 1-2 lbs per week.  Just bought  bottled water   Preferred Learning Style:    No preference indicated   Learning Readiness:  Ready  Change in progress   MEDICATIONS:    DIETARY INTAKE:  24-hr recall:  B ( AM):  Eggs and toast or raisin bran 2% Snk ( AM):   L skipped Snk ( PM): D ( PM): Spaghetti 2 cups, carrots, Sweet tea Snk ( PM) Beverages:sweet tea  Usual physical activity:  Estimated energy needs: 1200  calories 135 g carbohydrates 90 g protein 33 g fat  Progress Towards Goal(s):  In progress.   Nutritional Diagnosis:  NB-1.1 Food and nutrition-related knowledge deficit As related to  Obesity and prediabetes.  As evidenced by A1C >5.7 and BMI > 30 and history of a HA and CHF.    Intervention: Nutrition and  Pre Diabetes education provided on My Plate, CHO counting, meal planning, portion sizes, timing of meals, avoiding snacks between meals taking medications as prescribed, benefits of exercising 30 minutes per day and prevention of  DM.'. Healthy weight loss tips.  Teaching Method Utilized:  Visual Auditory Hands on  Handouts given during visit include:  Verbally went over My Plate  Barriers to  learning/adherence to lifestyle change: none  Demonstrated degree of understanding via:  Teach Back   Monitoring/Evaluation:  Dietary intake, exercise, meal planning, and body weight in 1 month(s).

## 2018-08-07 DIAGNOSIS — E039 Hypothyroidism, unspecified: Secondary | ICD-10-CM | POA: Diagnosis not present

## 2018-08-07 DIAGNOSIS — E1129 Type 2 diabetes mellitus with other diabetic kidney complication: Secondary | ICD-10-CM | POA: Diagnosis not present

## 2018-08-27 DIAGNOSIS — Z1159 Encounter for screening for other viral diseases: Secondary | ICD-10-CM | POA: Diagnosis not present

## 2018-08-27 DIAGNOSIS — I10 Essential (primary) hypertension: Secondary | ICD-10-CM | POA: Diagnosis not present

## 2018-08-27 DIAGNOSIS — R809 Proteinuria, unspecified: Secondary | ICD-10-CM | POA: Diagnosis not present

## 2018-08-27 DIAGNOSIS — E559 Vitamin D deficiency, unspecified: Secondary | ICD-10-CM | POA: Diagnosis not present

## 2018-08-27 DIAGNOSIS — D509 Iron deficiency anemia, unspecified: Secondary | ICD-10-CM | POA: Diagnosis not present

## 2018-08-27 DIAGNOSIS — N183 Chronic kidney disease, stage 3 (moderate): Secondary | ICD-10-CM | POA: Diagnosis not present

## 2018-09-06 ENCOUNTER — Telehealth: Payer: Self-pay | Admitting: "Endocrinology

## 2018-09-06 DIAGNOSIS — E1129 Type 2 diabetes mellitus with other diabetic kidney complication: Secondary | ICD-10-CM | POA: Diagnosis not present

## 2018-09-06 DIAGNOSIS — E039 Hypothyroidism, unspecified: Secondary | ICD-10-CM | POA: Diagnosis not present

## 2018-09-06 DIAGNOSIS — I10 Essential (primary) hypertension: Secondary | ICD-10-CM | POA: Diagnosis not present

## 2018-09-06 DIAGNOSIS — R600 Localized edema: Secondary | ICD-10-CM | POA: Diagnosis not present

## 2018-09-06 NOTE — Telephone Encounter (Signed)
Last time we saw her A1c was 5.7%.  If she is running hyperglycemia lately, we need to see some of her readings before we prescribe additional medications.  Request for 3 days of blood glucose readings.

## 2018-09-06 NOTE — Telephone Encounter (Signed)
Patient's social worker, [name] called and left a VM on behalf of Rosealee Albee, NP at [facility]. He wants to know if you would give her something in addition to her glipizide to get her sugar levels down. He can be reached at [phone].

## 2018-09-07 NOTE — Telephone Encounter (Signed)
Called # provided. No answer.

## 2018-09-10 NOTE — Telephone Encounter (Signed)
No answer

## 2018-09-17 ENCOUNTER — Ambulatory Visit (INDEPENDENT_AMBULATORY_CARE_PROVIDER_SITE_OTHER): Payer: Medicare Other | Admitting: Cardiovascular Disease

## 2018-09-17 ENCOUNTER — Other Ambulatory Visit: Payer: Self-pay

## 2018-09-17 ENCOUNTER — Encounter: Payer: Self-pay | Admitting: Cardiovascular Disease

## 2018-09-17 VITALS — BP 157/90 | HR 68 | Ht 61.0 in | Wt 202.4 lb

## 2018-09-17 DIAGNOSIS — I10 Essential (primary) hypertension: Secondary | ICD-10-CM

## 2018-09-17 DIAGNOSIS — I25118 Atherosclerotic heart disease of native coronary artery with other forms of angina pectoris: Secondary | ICD-10-CM

## 2018-09-17 DIAGNOSIS — Z8673 Personal history of transient ischemic attack (TIA), and cerebral infarction without residual deficits: Secondary | ICD-10-CM

## 2018-09-17 DIAGNOSIS — E785 Hyperlipidemia, unspecified: Secondary | ICD-10-CM

## 2018-09-17 DIAGNOSIS — I5042 Chronic combined systolic (congestive) and diastolic (congestive) heart failure: Secondary | ICD-10-CM | POA: Diagnosis not present

## 2018-09-17 DIAGNOSIS — I429 Cardiomyopathy, unspecified: Secondary | ICD-10-CM

## 2018-09-17 MED ORDER — AMLODIPINE BESYLATE 10 MG PO TABS: 10.0000 mg | ORAL_TABLET | Freq: Every day | ORAL | 6 refills | Status: DC

## 2018-09-17 NOTE — Progress Notes (Signed)
SUBJECTIVE: The patient presents for follow-up of cardiomyopathy and coronary artery disease.  She has a history of bare-metal stent to the RCA in 2004. She underwent a low risk nuclear stress test in May 2017.  Nuclear stress test on 03/29/17 demonstrated a large defect of moderate severity present in the apical anterior and apex. This appeared to be consistent with prior myocardial infarction.  Echocardiogram obtained 06/02/2017 demonstrated an improvement in left ventricular systolic function, LVEF now 40 to 45% (previously 20%), mild LVH, diffuse hypokinesis, grade 1 diastolic dysfunction, and mild mitral regurgitation.  She denies chest pain, palpitations, shortness of breath.  She complains of bilateral feet neuropathy and she does take gabapentin.  She was taken off metformin and wants something to substitute for this.  She sees her endocrinologist on August 18.    Review of Systems: As per "subjective", otherwise negative.  Allergies  Allergen Reactions  . Cedar     Swelling   . Other     Teriyaki- swelling    Current Outpatient Medications  Medication Sig Dispense Refill  . amLODipine (NORVASC) 5 MG tablet Take 5 mg by mouth daily.    Marland Kitchen aspirin 325 MG EC tablet Take 1 tablet (325 mg total) by mouth daily.    Marland Kitchen atorvastatin (LIPITOR) 80 MG tablet TAKE ONE TABLET BY MOUTH DAILY 90 tablet 1  . Blood Glucose Monitoring Suppl (ACCU-CHEK GUIDE ME) w/Device KIT 1 Piece by Does not apply route as directed. 1 kit 0  . carvedilol (COREG) 3.125 MG tablet Take 1 tablet (3.125 mg total) by mouth 2 (two) times daily with a meal. 62 tablet 1  . clopidogrel (PLAVIX) 75 MG tablet TAKE ONE TABLET BY MOUTH EVERY DAY WITH BREAKFAST 90 tablet 1  . furosemide (LASIX) 40 MG tablet TAKE ONE TABLET BY MOUTH TWICE DAILY 180 tablet 1  . gabapentin (NEURONTIN) 300 MG capsule TAKE ONE CAPSULE BY MOUTH TWICE DAILY 62 capsule 2  . glipiZIDE (GLUCOTROL XL) 5 MG 24 hr tablet Take 1 tablet (5  mg total) by mouth daily with breakfast. 30 tablet 3  . glucose blood (ACCU-CHEK GUIDE) test strip Use as instructed 4 x daily. e11.65 150 each 2  . levothyroxine (SYNTHROID) 100 MCG tablet Take 1 tablet by mouth daily.    . nitroGLYCERIN (NITROSTAT) 0.4 MG SL tablet Place 1 tablet (0.4 mg total) under the tongue every 5 (five) minutes as needed for chest pain. 30 tablet 2  . pantoprazole (PROTONIX) 40 MG tablet TAKE ONE TABLET BY MOUTH DAILY 90 tablet 1  . Vitamin D, Ergocalciferol, (DRISDOL) 50000 units CAPS capsule TAKE ONE CAPSULE BY MOUTH ONCE WEEKLY ON MONDAY  0   No current facility-administered medications for this visit.     Past Medical History:  Diagnosis Date  . Cervical cancer ([place])    cryotherapy and colposcopy/[place], [place]; 1991  . CHF (congestive heart failure) ([place]) 07-2014  . Diabetes mellitus without complication ([place]) 8110  . High cholesterol   . Hypertension   . MI (myocardial infarction) ([place])    2004  . Stroke ([place]) 09/2014, 2013  . Vaginal Pap smear, abnormal     Past Surgical History:  Procedure Laterality Date  . CHOLECYSTECTOMY  1992  . CORONARY STENT PLACEMENT  2004  . TUBAL LIGATION      Social History   Socioeconomic History  . Marital status: Legally Separated    Spouse name: Not on file  . Number of children: Not on file  .  Years of education: Not on file  . Highest education level: Not on file  Occupational History  . Not on file  Social Needs  . Financial resource strain: Not on file  . Food insecurity    Worry: Not on file    Inability: Not on file  . Transportation needs    Medical: Not on file    Non-medical: Not on file  Tobacco Use  . Smoking status: Never Smoker  . Smokeless tobacco: Never Used  Substance and Sexual Activity  . Alcohol use: No  . Drug use: No  . Sexual activity: Yes    Birth control/protection: None, Surgical    Comment: tubal  Lifestyle  . Physical activity    Days per week: Not on file     Minutes per session: Not on file  . Stress: Not on file  Relationships  . Social Herbalist on phone: Not on file    Gets together: Not on file    Attends religious service: Not on file    Active member of club or organization: Not on file    Attends meetings of clubs or organizations: Not on file    Relationship status: Not on file  . Intimate partner violence    Fear of current or ex partner: Not on file    Emotionally abused: Not on file    Physically abused: Not on file    Forced sexual activity: Not on file  Other Topics Concern  . Not on file  Social History Narrative   Lives alone.    Has some friends.    Neighbors.      Vitals:   09/17/18 1315  BP: (!) 157/90  Pulse: 68  SpO2: 97%  Weight: 202 lb 6.4 oz (91.8 kg)  Height: _0  (1.549 m)    Wt Readings from Last 3 Encounters:  09/17/18 202 lb 6.4 oz (91.8 kg)  07/09/18 203 lb (92.1 kg)  12/19/17 197 lb (89.4 kg)     PHYSICAL EXAM General: NAD HEENT: Normal. Neck: No JVD, no thyromegaly. Lungs: Clear to auscultation bilaterally with normal respiratory effort. CV: Regular rate and rhythm, normal S1/S2, no S3/S4, no murmur. No pretibial or periankle edema.  No carotid bruit.   Abdomen: Soft, nontender, no distention.  Neurologic: Alert and oriented.  Psych: Normal affect. Musculoskeletal: No gross deformities.    ECG: Reviewed above under Subjective   Labs: Lab Results  Component Value Date/Time   K 4.2 05/24/2017 11:11 AM   BUN 40 (H) 05/24/2017 11:11 AM   CREATININE 2.18 (H) 05/24/2017 11:11 AM   ALT 12 04/20/2017 02:18 PM   TSH 3.08 04/20/2017 02:18 PM   HGB 11.0 (L) 05/24/2017 11:11 AM     Lipids: Lab Results  Component Value Date/Time   LDLCALC 148 (H) 04/20/2017 02:18 PM   CHOL 231 (H) 04/20/2017 02:18 PM   TRIG 301 (H) 04/20/2017 02:18 PM   HDL 37 (L) 04/20/2017 02:18 PM       ASSESSMENT AND PLAN: 1. Chronic combined systolic and diastolic heart failure: Euvolemic  and stable. Currently on carvedilol 3.125 mg twice daily, and Lasix 40 mg twice daily.  Delene Loll was stopped by nephrology.  Nuclear stress test reviewed above with no evidence of ischemia and presence of myocardial scar noted.  LVEF has improved to 40 to 45%.  2. Cardiomyopathy:Nuclear stress test reviewed above with no evidence of ischemia and presence of myocardial scar noted. Continue carvedilol.  Entresto stopped  by nephrology.  LVEF has improved to 40 to 45%.  3. Coronary artery disease with history of bare-metal stent to the RCA in 2004:Stable ischemic heart disease. Continue aspirin, carvedilol, and statin. Nuclear stress test reviewed above with no evidence of ischemia.  4. Bilateral CVA: On aspirin, Plavix, and statin.  5. Chronic hypertension:Blood pressure is elevated today.  I will increase amlodipine to 10 mg daily.  6. Hyperlipidemia: Continue Lipitor.     Disposition: Follow up 6 months with APP   Kate Sable, M.D., F.A.C.C.

## 2018-09-17 NOTE — Patient Instructions (Signed)
Medication Instructions:   Increase Amlodipine to 10mg  daily.  Continue all other medications.    Labwork: none  Testing/Procedures: none  Follow-Up: Your physician wants you to follow up in: 6 months.  You will receive a reminder letter in the mail one-two months in advance.  If you don't receive a letter, please call our office to schedule the follow up appointment   Any Other Special Instructions Will Be Listed Below (If Applicable).  If you need a refill on your cardiac medications before your next appointment, please call your pharmacy.

## 2018-09-18 ENCOUNTER — Other Ambulatory Visit: Payer: Self-pay

## 2018-09-18 ENCOUNTER — Encounter: Payer: Medicare Other | Attending: Family | Admitting: Nutrition

## 2018-09-18 DIAGNOSIS — R739 Hyperglycemia, unspecified: Secondary | ICD-10-CM

## 2018-09-18 DIAGNOSIS — E782 Mixed hyperlipidemia: Secondary | ICD-10-CM

## 2018-09-18 DIAGNOSIS — E669 Obesity, unspecified: Secondary | ICD-10-CM

## 2018-09-18 NOTE — Progress Notes (Signed)
  Medical Nutrition Therapy:  Appt start time: 1330 end time:  1400.   Assessment:  Primary concerns today: pre Dm and Obesity, Mixed Hyperlipidemia.  Lives by herself. Her  Neighbor takes her to the store at times. Has been working on drinking more water and eating more fruit. Trying to eat more vegetables. Has been walking up and down stairwell. Has been cutting out sweet tea. Hasn't been taking Metformin stopped due to kidney issues. Hasn't been testing.  Marland Kitchen Heart attack in 2004 and history of  X 5.  Isn't using any salt now. Boiled and bakes her foods. Doesn't like a lot of vegetables but does like some fruit. Typically has been skipping lunch and only  2 eating meals per day. Drinks sweet tea and working on reducing sugar in it and drinking more water Isn't exercising but willing to start walking to mailbox and back. Willing to work on losing weight.     Just bought  bottled water   Preferred Learning Style:    No preference indicated   Learning Readiness:  Ready  Change in progress   MEDICATIONS:    DIETARY INTAKE:  24-hr recall:  B ( AM):  Poptart:      Snk ( AM):   L salad-lettuce, water Snk ( PM): D ( PM): skipped.  Snk ( PM) Beverages  Usual physical activity:  Estimated energy needs: 1200  calories 135 g carbohydrates 90 g protein 33 g fat  Progress Towards Goal(s):  In progress.   Nutritional Diagnosis:  NB-1.1 Food and nutrition-related knowledge deficit As related to  Obesity and prediabetes.  As evidenced by A1C >5.7 and BMI > 30 and history of a HA and CHF.    Intervention: Nutrition and  Pre Diabetes education provided on My Plate, CHO counting, meal planning, portion sizes, timing of meals, avoiding snacks between meals taking medications as prescribed, benefits of exercising 30 minutes per day and prevention of  DM.'. Healthy weight loss tips.  Goals Follow MY Plate 50% of plate low carb vegetable Increase fruit with meals. Cut  out sweet tea and drink only water Walk to mailbox 3 times per day. Lose 1-2 lbs per week. Drink 6 bottles every day. Teaching Method Utilized:  Visual Auditory Hands on  Handouts given during visit include:  Verbally went over My Plate  Barriers to learning/adherence to lifestyle change: none  Demonstrated degree of understanding via:  Teach Back   Monitoring/Evaluation:  Dietary intake, exercise, meal planning, and body weight in 3 month(s). Recommend to recheck A1C levels and lipids.

## 2018-09-20 DIAGNOSIS — R600 Localized edema: Secondary | ICD-10-CM | POA: Diagnosis not present

## 2018-09-20 DIAGNOSIS — M25572 Pain in left ankle and joints of left foot: Secondary | ICD-10-CM | POA: Diagnosis not present

## 2018-09-20 NOTE — Addendum Note (Signed)
Addended by: Merlene Laughter on: 09/20/2018 01:27 PM   Modules accepted: Orders

## 2018-09-26 DIAGNOSIS — I10 Essential (primary) hypertension: Secondary | ICD-10-CM | POA: Diagnosis not present

## 2018-09-26 DIAGNOSIS — E1165 Type 2 diabetes mellitus with hyperglycemia: Secondary | ICD-10-CM | POA: Diagnosis not present

## 2018-09-26 DIAGNOSIS — I509 Heart failure, unspecified: Secondary | ICD-10-CM | POA: Diagnosis not present

## 2018-09-26 DIAGNOSIS — Z7982 Long term (current) use of aspirin: Secondary | ICD-10-CM | POA: Diagnosis not present

## 2018-09-26 DIAGNOSIS — Z79899 Other long term (current) drug therapy: Secondary | ICD-10-CM | POA: Diagnosis not present

## 2018-10-02 ENCOUNTER — Other Ambulatory Visit: Payer: Self-pay

## 2018-10-02 DIAGNOSIS — I10 Essential (primary) hypertension: Secondary | ICD-10-CM | POA: Diagnosis not present

## 2018-10-02 DIAGNOSIS — N183 Chronic kidney disease, stage 3 (moderate): Secondary | ICD-10-CM | POA: Diagnosis not present

## 2018-10-02 DIAGNOSIS — E559 Vitamin D deficiency, unspecified: Secondary | ICD-10-CM | POA: Diagnosis not present

## 2018-10-02 DIAGNOSIS — D649 Anemia, unspecified: Secondary | ICD-10-CM | POA: Diagnosis not present

## 2018-10-02 DIAGNOSIS — Z79899 Other long term (current) drug therapy: Secondary | ICD-10-CM | POA: Diagnosis not present

## 2018-10-02 DIAGNOSIS — R809 Proteinuria, unspecified: Secondary | ICD-10-CM | POA: Diagnosis not present

## 2018-10-02 MED ORDER — LANCETS THIN MISC: 1.0000 | Freq: Four times a day (QID) | 2 refills | Status: AC

## 2018-10-03 LAB — BASIC METABOLIC PANEL
BUN: 24 — AB (ref 4–21)
Creatinine: 1.7 — AB (ref <=1.1)
Potassium: 4.3 (ref 3.4–5.3)

## 2018-10-03 LAB — VITAMIN D 25 HYDROXY (VIT D DEFICIENCY, FRACTURES): Vit D, 25-Hydroxy: 16.8

## 2018-10-04 ENCOUNTER — Encounter: Payer: Self-pay | Admitting: Nutrition

## 2018-10-04 NOTE — Patient Instructions (Signed)
  Goals Follow MY Plate 50% of plate low carb vegetable Increase fruit with meals. Cut out sweet tea and drink only water Walk to mailbox 3 times per day. Lose 1-2 lbs per week. Drink 6 bottles every day.

## 2018-10-09 ENCOUNTER — Ambulatory Visit: Payer: Medicare HMO | Admitting: "Endocrinology

## 2018-10-09 ENCOUNTER — Other Ambulatory Visit: Payer: Self-pay

## 2018-10-16 ENCOUNTER — Encounter: Payer: Self-pay | Admitting: Nutrition

## 2018-10-16 ENCOUNTER — Other Ambulatory Visit: Payer: Self-pay

## 2018-10-16 ENCOUNTER — Encounter: Payer: Medicare HMO | Attending: "Endocrinology | Admitting: Nutrition

## 2018-10-16 DIAGNOSIS — E7800 Pure hypercholesterolemia, unspecified: Secondary | ICD-10-CM | POA: Insufficient documentation

## 2018-10-16 DIAGNOSIS — I2510 Atherosclerotic heart disease of native coronary artery without angina pectoris: Secondary | ICD-10-CM

## 2018-10-16 DIAGNOSIS — I10 Essential (primary) hypertension: Secondary | ICD-10-CM | POA: Insufficient documentation

## 2018-10-16 DIAGNOSIS — Z8673 Personal history of transient ischemic attack (TIA), and cerebral infarction without residual deficits: Secondary | ICD-10-CM | POA: Insufficient documentation

## 2018-10-16 DIAGNOSIS — E119 Type 2 diabetes mellitus without complications: Secondary | ICD-10-CM | POA: Insufficient documentation

## 2018-10-16 NOTE — Progress Notes (Signed)
   Medical Nutrition Therapy:  Appt start time: 1330 end time:  1400.   Assessment:  Primary concerns today: pre Dm and Obesity, Mixed Hyperlipidemia.  Lives by herself. Her  Neighbor takes her to the store at times. Has been working on drinking more water and eating more fruit. Trying to eat more vegetables. Has been walking up and down stairwell. Has been cutting out sweet tea. Hasn't been taking Metformin stopped due to kidney issues. Hasn't been testing.  Marland Kitchen Heart attack in 2004 and history of  X 5.  Isn't using any salt now. Boiled and bakes her foods. Doesn't like a lot of vegetables but does like some fruit. Typically has been skipping lunch and only  2 eating meals per day. Drinks sweet tea and working on reducing sugar in it and drinking more water Isn't exercising but willing to start walking to mailbox and back. Willing to work on losing weight.     Just bought  bottled water   Preferred Learning Style:    No preference indicated   Learning Readiness:  Ready  Change in progress   MEDICATIONS:    DIETARY INTAKE:  24-hr recall:  B ( AM):  Poptart:      Snk ( AM):   L salad-lettuce, water Snk ( PM): D ( PM): skipped.  Snk ( PM) Beverages  Usual physical activity:  Estimated energy needs: 1200  calories 135 g carbohydrates 90 g protein 33 g fat  Progress Towards Goal(s):  In progress.   Nutritional Diagnosis:  NB-1.1 Food and nutrition-related knowledge deficit As related to  Obesity and prediabetes.  As evidenced by A1C >5.7 and BMI > 30 and history of a HA and CHF.    Intervention: Nutrition and  Pre Diabetes education provided on My Plate, CHO counting, meal planning, portion sizes, timing of meals, avoiding snacks between meals taking medications as prescribed, benefits of exercising 30 minutes per day and prevention of  DM.'. Healthy weight loss tips.   Goals Don't 'skip meals. Increase fresh fruits and vegetables Keep drinking  water Keep testing blood sugars in and less than 180 before bed. Goals A1C is 7% or less. . Teaching Method Utilized:  Visual Auditory Hands on  Handouts given during visit include:  Verbally went over My Plate  Barriers to learning/adherence to lifestyle change: none  Demonstrated degree of understanding via:  Teach Back   Monitoring/Evaluation:  Dietary intake, exercise, meal planning, and body weight in 3 month(s). Recommend to recheck A1C levels and lipids.

## 2018-10-16 NOTE — Patient Instructions (Addendum)
Goals Don't 'skip meals. Increase fresh fruits and vegetables Keep drinking water Keep testing blood sugars in and less than 180 before bed. Goals A1C is 7% or less.

## 2018-10-17 ENCOUNTER — Encounter: Payer: Self-pay | Admitting: "Endocrinology

## 2018-10-17 ENCOUNTER — Ambulatory Visit (INDEPENDENT_AMBULATORY_CARE_PROVIDER_SITE_OTHER): Payer: Medicare HMO | Admitting: "Endocrinology

## 2018-10-17 VITALS — BP 126/71 | HR 87 | Ht 61.0 in | Wt 201.0 lb

## 2018-10-17 DIAGNOSIS — E038 Other specified hypothyroidism: Secondary | ICD-10-CM

## 2018-10-17 DIAGNOSIS — E119 Type 2 diabetes mellitus without complications: Secondary | ICD-10-CM | POA: Diagnosis not present

## 2018-10-17 DIAGNOSIS — E063 Autoimmune thyroiditis: Secondary | ICD-10-CM | POA: Diagnosis not present

## 2018-10-17 DIAGNOSIS — E559 Vitamin D deficiency, unspecified: Secondary | ICD-10-CM

## 2018-10-17 LAB — POCT GLYCOSYLATED HEMOGLOBIN (HGB A1C): Hemoglobin A1C: 12.4 % — AB (ref 4.0–5.6)

## 2018-10-17 MED ORDER — GLIPIZIDE 10 MG PO TABS: 10.0000 mg | ORAL_TABLET | Freq: Two times a day (BID) | ORAL | 3 refills | Status: DC

## 2018-10-17 NOTE — Patient Instructions (Signed)

## 2018-10-17 NOTE — Progress Notes (Signed)
Endocrinology Consult Note       10/17/2018, 12:44 PM   Subjective:    Patient ID: Monica Carlson, female    DOB: [birth year].  Monica Carlson is being seen in consultation for management of currently uncontrolled symptomatic diabetes requested by  Wyatt Haste, NP.   Past Medical History:  Diagnosis Date  . Cervical cancer ([place])    cryotherapy and colposcopy/[place], [place]; 1991  . CHF (congestive heart failure) ([place]) 07-2014  . Diabetes mellitus without complication ([place]) 8638  . High cholesterol   . Hypertension   . MI (myocardial infarction) ([place])    2004  . Stroke ([place]) 09/2014, 2013  . Vaginal Pap smear, abnormal     Past Surgical History:  Procedure Laterality Date  . CHOLECYSTECTOMY  1992  . CORONARY STENT PLACEMENT  2004  . TUBAL LIGATION      Social History   Socioeconomic History  . Marital status: Legally Separated    Spouse name: Not on file  . Number of children: Not on file  . Years of education: Not on file  . Highest education level: Not on file  Occupational History  . Not on file  Social Needs  . Financial resource strain: Not on file  . Food insecurity    Worry: Not on file    Inability: Not on file  . Transportation needs    Medical: Not on file    Non-medical: Not on file  Tobacco Use  . Smoking status: Never Smoker  . Smokeless tobacco: Never Used  Substance and Sexual Activity  . Alcohol use: No  . Drug use: No  . Sexual activity: Yes    Birth control/protection: None, Surgical    Comment: tubal  Lifestyle  . Physical activity    Days per week: Not on file    Minutes per session: Not on file  . Stress: Not on file  Relationships  . Social Herbalist on phone: Not on file    Gets together: Not on file    Attends religious service: Not on file    Active member of club or organization: Not on file    Attends meetings of clubs or  organizations: Not on file    Relationship status: Not on file  Other Topics Concern  . Not on file  Social History Narrative   Lives alone.    Has some friends.    Neighbors.     Family History  Problem Relation Age of Onset  . Migraines Mother   . Heart disease Father   . Hypertension Father   . Diabetes Father   . Migraines Maternal Grandfather   . Heart attack Maternal Grandfather   . Lung cancer Paternal Grandmother   . Colon cancer Neg Hx   . Colon polyps Neg Hx     Outpatient Encounter Medications as of 10/17/2018  Medication Sig  . amLODipine (NORVASC) 10 MG tablet Take 1 tablet (10 mg total) by mouth daily.  Marland Kitchen aspirin 325 MG EC tablet Take 1 tablet (325 mg total) by mouth daily.  Marland Kitchen atorvastatin (LIPITOR) 80 MG tablet  TAKE ONE TABLET BY MOUTH DAILY  . Blood Glucose Monitoring Suppl (ACCU-CHEK GUIDE ME) w/Device KIT 1 Piece by Does not apply route as directed.  . carvedilol (COREG) 3.125 MG tablet Take 1 tablet (3.125 mg total) by mouth 2 (two) times daily with a meal.  . clopidogrel (PLAVIX) 75 MG tablet TAKE ONE TABLET BY MOUTH EVERY DAY WITH BREAKFAST  . furosemide (LASIX) 40 MG tablet TAKE ONE TABLET BY MOUTH TWICE DAILY  . gabapentin (NEURONTIN) 300 MG capsule TAKE ONE CAPSULE BY MOUTH TWICE DAILY  . glipiZIDE (GLUCOTROL) 10 MG tablet Take 1 tablet (10 mg total) by mouth 2 (two) times daily before a meal.  . glucose blood (ACCU-CHEK GUIDE) test strip Use as instructed 4 x daily. e11.65  . glucose blood test strip 1 each by Other route 4 (four) times daily. Use as instructed qid. E11.65. Prodigy Meter  . Lancets Thin MISC 1 each by Does not apply route 4 (four) times daily. E11.65 Pt has Prodigy meter  . levothyroxine (SYNTHROID) 100 MCG tablet Take 1 tablet by mouth daily.  . nitroGLYCERIN (NITROSTAT) 0.4 MG SL tablet Place 1 tablet (0.4 mg total) under the tongue every 5 (five) minutes as needed for chest pain.  . pantoprazole (PROTONIX) 40 MG tablet TAKE ONE TABLET  BY MOUTH DAILY  . Vitamin D, Ergocalciferol, (DRISDOL) 50000 units CAPS capsule TAKE ONE CAPSULE BY MOUTH ONCE WEEKLY ON MONDAY  . [DISCONTINUED] glipiZIDE (GLUCOTROL XL) 5 MG 24 hr tablet Take 1 tablet (5 mg total) by mouth daily with breakfast.   No facility-administered encounter medications on file as of 10/17/2018.     ALLERGIES: Allergies  Allergen Reactions  . [place]     Swelling   . Other     Teriyaki- swelling    VACCINATION STATUS: Immunization History  Administered Date(s) Administered  . Tdap 04/20/2017    Diabetes She presents for her follow-up diabetic visit. She has type 2 diabetes mellitus. Onset time: She was diagnosed at approximate age of 56 years. Her disease course has been worsening. Pertinent negatives for hypoglycemia include no confusion, headaches, nervousness/anxiousness, pallor, seizures, sweats or tremors. Associated symptoms include polydipsia and polyuria. Pertinent negatives for diabetes include no chest pain and no polyphagia. Pertinent negatives for hypoglycemia complications include no required assistance. Symptoms are worsening. Diabetic complications include heart disease and nephropathy. Risk factors for coronary artery disease include diabetes mellitus, dyslipidemia, family history, hypertension, sedentary lifestyle, post-menopausal and obesity. Current diabetic treatments: She is currently on glimepiride 4 mg p.o. daily. Her weight is fluctuating minimally. She is following a generally unhealthy diet. When asked about meal planning, she reported none. She has not had a previous visit with a dietitian. She never participates in exercise. (She presents some rare and random blood glucose profile significantly above target, averaging 250 mg per DL.  Her in office A1c was 12.4%  ) An ACE inhibitor/angiotensin II receptor blocker is not being taken.  Hyperlipidemia This is a chronic problem. The current episode started more than 1 year ago. The problem is  uncontrolled. Exacerbating diseases include diabetes, hypothyroidism and obesity. Pertinent negatives include no chest pain, myalgias or shortness of breath. Current antihyperlipidemic treatment includes statins. Risk factors for coronary artery disease include dyslipidemia, diabetes mellitus, hypertension, obesity, family history, a sedentary lifestyle and post-menopausal.  Hypertension This is a chronic problem. The current episode started more than 1 year ago. Pertinent negatives include no chest pain, headaches, palpitations, shortness of breath or sweats. Risk factors for coronary  artery disease include dyslipidemia, diabetes mellitus, obesity, post-menopausal state and sedentary lifestyle. Past treatments include calcium channel blockers and beta blockers. Hypertensive end-organ damage includes kidney disease and CAD/MI.     Review of Systems  Constitutional: Negative for chills, fever and unexpected weight change.  HENT: Negative for trouble swallowing and voice change.   Eyes: Negative for visual disturbance.  Respiratory: Negative for cough, shortness of breath and wheezing.   Cardiovascular: Negative for chest pain, palpitations and leg swelling.  Gastrointestinal: Negative for diarrhea, nausea and vomiting.  Endocrine: Positive for polydipsia and polyuria. Negative for cold intolerance, heat intolerance and polyphagia.  Musculoskeletal: Negative for arthralgias and myalgias.  Skin: Negative for color change, pallor, rash and wound.  Neurological: Negative for tremors, seizures and headaches.  Psychiatric/Behavioral: Negative for confusion and suicidal ideas. The patient is not nervous/anxious.     Objective:    BP 126/71   Pulse 87   Ht '5\' 1"'  (1.549 m)   Wt 201 lb (91.2 kg)   BMI 37.98 kg/m   Wt Readings from Last 3 Encounters:  10/17/18 201 lb (91.2 kg)  09/17/18 202 lb 6.4 oz (91.8 kg)  07/09/18 203 lb (92.1 kg)     CMP     Component Value Date/Time   NA 141  05/24/2017 1111   K 4.3 10/03/2018   CL 97 (L) 05/24/2017 1111   CO2 33 (H) 05/24/2017 1111   GLUCOSE 57 (L) 05/24/2017 1111   BUN 24 (A) 10/03/2018   CREATININE 1.7 (A) 10/03/2018   CREATININE 2.18 (H) 05/24/2017 1111   CALCIUM 9.3 05/24/2017 1111   PROT 7.6 04/20/2017 1418   ALBUMIN 2.9 (L) 10/07/2016 0213   AST 13 04/20/2017 1418   ALT 12 04/20/2017 1418   ALKPHOS 55 10/07/2016 0213   BILITOT 0.5 04/20/2017 1418   GFRNONAA >60 10/14/2016 0606   GFRNONAA >89 10/23/2015 1008   GFRAA >60 10/14/2016 0606   GFRAA >89 10/23/2015 1008     Diabetic Labs (most recent): Lab Results  Component Value Date   HGBA1C 12.4 (A) 10/17/2018   HGBA1C 5.7 (H) 04/20/2017   HGBA1C 7.0 (H) 01/16/2017     Lipid Panel ( most recent) Lipid Panel     Component Value Date/Time   CHOL 231 (H) 04/20/2017 1418   TRIG 301 (H) 04/20/2017 1418   HDL 37 (L) 04/20/2017 1418   CHOLHDL 6.2 (H) 04/20/2017 1418   VLDL 20 10/08/2016 0628   LDLCALC 148 (H) 04/20/2017 1418      Lab Results  Component Value Date   TSH 3.08 04/20/2017   TSH 5.98 (H) 01/16/2017   TSH 7.08 (H) 11/15/2016   TSH 17.574 (H) 10/07/2016   TSH 9.64 (H) 10/23/2015   TSH 7.43 (H) 05/22/2015      Assessment & Plan:   1. Diabetes mellitus without complication ([place])   - Sacoya Mcgourty has currently controlled symptomatic type 2 DM since  53 years of age. -She missed her last appointment, returns with his manager with significant hyperglycemia and point-of-care A1c was significantly elevated at 12.4% from 5.7% during her last time.  Her recent labs showed CKD.  - I had a long discussion with her about the progressive nature of diabetes and the pathology behind its complications. -her diabetes is complicated by coronary artery disease, nephropathy , obesity/sedentary life and she remains at a high risk for more acute and chronic complications which include CAD, CVA, CKD, retinopathy, and neuropathy. These are all discussed  in  detail with her.  - I have counseled her on diet management and weight loss, by adopting a carbohydrate restricted/protein rich diet.  - she  admits there is a room for improvement in her diet and drink choices. -  Suggestion is made for her to avoid simple carbohydrates  from her diet including Cakes, Sweet Desserts / Pastries, Ice Cream, Soda (diet and regular), Sweet Tea, Candies, Chips, Cookies, Sweet Pastries,  Store Bought Juices, Alcohol in Excess of  1-2 drinks a day, Artificial Sweeteners, Coffee Creamer, and "Sugar-free" Products. This will help patient to have stable blood glucose profile and potentially avoid unintended weight gain.   - I encouraged her to switch to  unprocessed or minimally processed complex starch and increased protein intake (animal or plant source), fruits, and vegetables.  - she is advised to stick to a routine mealtimes to eat 3 meals  a day and avoid unnecessary snacks ( to snack only to correct hypoglycemia).   - she will be scheduled with Jearld Fenton, RDN, CDE for individualized diabetes education.  -She is an immediate candidate for insulin treatment in order for her to achieve and maintain control of diabetes to target. -However, this patient has significant cognitive deficit, and she is not not willing to start insulin treatment nor is she complaining of doing it safely.  She will likely need either a home aide to help her with insulin administration or placement in a group home where she could get assistance with skilled placement.   -Our case manager will initiate exploring her options. -In the meantime, in the interest of keeping treatment stable for her, I discussed and increased her glipizide to 10 mg p.o. twice daily with breakfast and supper.  She is supposed to start monitoring blood glucose at least 2 times a day before breakfast and before bedtime and reengage for phone visit in 2 weeks. -She is encouraged to call clinic for readings below 70  or greater than 300 mg per DL. - she is not a candidate for metformin, SGLT2 inhibitors due to concurrent renal insufficiency.  - she will be considered for incretin therapy as appropriate next visit.  - Patient specific target  A1c;  LDL, HDL, Triglycerides, and  Waist Circumference were discussed in detail.  2) Blood Pressure /Hypertension:  her blood pressure is uncontrolled to target.   she is advised to continue her current medications including carvedilol 3.125 mg twice daily, amlodipine 5 mg p.o. daily.   3) Lipids/Hyperlipidemia:   Review of her recent lipid panel showed uncontrolled  LDL at 148 .  she  is advised to continue atorvastatin 80 mg daily at bedtime.  Side effects and precautions discussed with her.  4)  Weight/Diet:  Body mass index is 37.98 kg/m.  -   clearly complicating her diabetes care.  I discussed with her the fact that loss of 5 - 10% of her  current body weight will have the most impact on her diabetes management.  CDE Consult will be initiated . Exercise, and detailed carbohydrates information provided  -  detailed on discharge instructions.  5) hypothyroidism: Her most recent TSH was on target at 3.08.  She is advised to continue levothyroxine 75 mcg p.o. every morning.  - We discussed about the correct intake of her thyroid hormone, on empty stomach at fasting, with water, separated by at least 30 minutes from breakfast and other medications,  and separated by more than 4 hours from calcium, iron, multivitamins, acid reflux medications (PPIs). -  Patient is made aware of the fact that thyroid hormone replacement is needed for life, dose to be adjusted by periodic monitoring of thyroid function tests.   6) Chronic Care/Health Maintenance:  -she  is on  Statin medications and  is encouraged to initiate and continue to follow up with Ophthalmology, Dentist,  Podiatrist at least yearly or according to recommendations, and advised to   stay away from smoking. I have  recommended yearly flu vaccine and pneumonia vaccine at least every 5 years; moderate intensity exercise for up to 150 minutes weekly; and  sleep for at least 7 hours a day.  - she is  advised to maintain close follow up with Wyatt Haste, NP for primary care needs, as well as her other providers for optimal and coordinated care.  - Patient Care Time Today:  25 min, of which >50% was spent in  counseling and the rest reviewing her  current and  previous labs/studies, previous treatments, her blood glucose readings, and medications' doses and developing a plan for long-term care based on the latest recommendations for standards of care.   Joory Gough participated in the discussions, expressed understanding, and voiced agreement with the above plans.  All questions were answered to her satisfaction. she is encouraged to contact clinic should she have any questions or concerns prior to her return visit.   Follow up plan: - Return in about 2 weeks (around 10/31/2018) for Follow up with [facility] Only - no Labs.  Glade Lloyd, MD [facility] [address] [place], [place] [address] Phone: [phone]  Fax: [phone]    10/17/2018, 12:44 PM  This note was partially dictated with voice recognition software. Similar sounding words can be transcribed inadequately or may not  be corrected upon review.

## 2018-10-24 DIAGNOSIS — E1129 Type 2 diabetes mellitus with other diabetic kidney complication: Secondary | ICD-10-CM | POA: Diagnosis not present

## 2018-10-24 DIAGNOSIS — R600 Localized edema: Secondary | ICD-10-CM | POA: Diagnosis not present

## 2018-10-24 DIAGNOSIS — N289 Disorder of kidney and ureter, unspecified: Secondary | ICD-10-CM | POA: Diagnosis not present

## 2018-11-01 ENCOUNTER — Encounter: Payer: Self-pay | Admitting: "Endocrinology

## 2018-11-01 ENCOUNTER — Other Ambulatory Visit: Payer: Self-pay

## 2018-11-01 ENCOUNTER — Ambulatory Visit (INDEPENDENT_AMBULATORY_CARE_PROVIDER_SITE_OTHER): Payer: Medicare HMO | Admitting: "Endocrinology

## 2018-11-01 DIAGNOSIS — E119 Type 2 diabetes mellitus without complications: Secondary | ICD-10-CM

## 2018-11-01 DIAGNOSIS — E038 Other specified hypothyroidism: Secondary | ICD-10-CM | POA: Diagnosis not present

## 2018-11-01 DIAGNOSIS — E559 Vitamin D deficiency, unspecified: Secondary | ICD-10-CM | POA: Diagnosis not present

## 2018-11-01 DIAGNOSIS — E063 Autoimmune thyroiditis: Secondary | ICD-10-CM | POA: Diagnosis not present

## 2018-11-01 MED ORDER — SITAGLIPTIN PHOSPHATE 50 MG PO TABS: 50.0000 mg | ORAL_TABLET | Freq: Every day | ORAL | 3 refills | Status: DC

## 2018-11-01 NOTE — Progress Notes (Signed)
                                                      11/01/2018, 4:37 PM                                                    Endocrinology Telehealth Visit Follow up Note -During COVID -19 Pandemic  This visit type was conducted due to national recommendations for restrictions regarding the COVID-19 Pandemic  in an effort to limit this patient's exposure and mitigate transmission of the corona virus.  Due to her co-morbid illnesses, Monica Carlson is at  moderate to high risk for complications without adequate follow up.  This format is felt to be most appropriate for her at this time.  I connected with this patient on 11/01/2018   by telephone and verified that I am speaking with the correct person using two identifiers. Monica Carlson, 12/13/1965. she has verbally consented to this visit. All issues noted in this document were discussed and addressed. The format was not optimal for physical exam.    Subjective:    Patient ID: Monica Carlson, female    DOB: 06/16/1965.  Monica Carlson is being engaged in telehealth via telephone for follow-up in  management of currently uncontrolled symptomatic diabetes requested by  Vaughn, Jason C, NP.   Past Medical History:  Diagnosis Date  . Cervical cancer (HCC)    cryotherapy and colposcopy/[place], [place]; 1991  . CHF (congestive heart failure) (HCC) 07-2014  . Diabetes mellitus without complication (HCC) 2000  . High cholesterol   . Hypertension   . MI (myocardial infarction) (HCC)    2004  . Stroke (HCC) 09/2014, 2013  . Vaginal Pap smear, abnormal     Past Surgical History:  Procedure Laterality Date  . CHOLECYSTECTOMY  1992  . CORONARY STENT PLACEMENT  2004  . TUBAL LIGATION      Social History   Socioeconomic History  . Marital status: Legally Separated    Spouse name: Not on file  . Number of children: Not on file  . Years of education: Not on file  . Highest education level: Not on file   Occupational History  . Not on file  Social Needs  . Financial resource strain: Not on file  . Food insecurity    Worry: Not on file    Inability: Not on file  . Transportation needs    Medical: Not on file    Non-medical: Not on file  Tobacco Use  . Smoking status: Never Smoker  . Smokeless tobacco: Never Used  Substance and Sexual Activity  . Alcohol use: No  . Drug use: No  . Sexual activity: Yes    Birth control/protection: None, Surgical    Comment: tubal  Lifestyle  . Physical activity    Days per week: Not on file    Minutes per session: Not on file  . Stress: Not on file  Relationships  . Social connections    Talks on phone: Not on file    Gets together: Not on file    Attends religious service: Not on file    Active member   of club or organization: Not on file    Attends meetings of clubs or organizations: Not on file    Relationship status: Not on file  Other Topics Concern  . Not on file  Social History Narrative   Lives alone.    Has some friends.    Neighbors.     Family History  Problem Relation Age of Onset  . Migraines Mother   . Heart disease Father   . Hypertension Father   . Diabetes Father   . Migraines Maternal Grandfather   . Heart attack Maternal Grandfather   . Lung cancer Paternal Grandmother   . Colon cancer Neg Hx   . Colon polyps Neg Hx     Outpatient Encounter Medications as of 11/01/2018  Medication Sig  . amLODipine (NORVASC) 10 MG tablet Take 1 tablet (10 mg total) by mouth daily.  . aspirin 325 MG EC tablet Take 1 tablet (325 mg total) by mouth daily.  . atorvastatin (LIPITOR) 80 MG tablet TAKE ONE TABLET BY MOUTH DAILY  . Blood Glucose Monitoring Suppl (ACCU-CHEK GUIDE ME) w/Device KIT 1 Piece by Does not apply route as directed.  . carvedilol (COREG) 3.125 MG tablet Take 1 tablet (3.125 mg total) by mouth 2 (two) times daily with a meal.  . clopidogrel (PLAVIX) 75 MG tablet TAKE ONE TABLET BY MOUTH EVERY DAY WITH  BREAKFAST  . furosemide (LASIX) 40 MG tablet TAKE ONE TABLET BY MOUTH TWICE DAILY  . gabapentin (NEURONTIN) 300 MG capsule TAKE ONE CAPSULE BY MOUTH TWICE DAILY  . glipiZIDE (GLUCOTROL) 10 MG tablet Take 1 tablet (10 mg total) by mouth 2 (two) times daily before a meal.  . glucose blood (ACCU-CHEK GUIDE) test strip Use as instructed 4 x daily. e11.65  . glucose blood test strip 1 each by Other route 4 (four) times daily. Use as instructed qid. E11.65. Prodigy Meter  . Lancets Thin MISC 1 each by Does not apply route 4 (four) times daily. E11.65 Pt has Prodigy meter  . levothyroxine (SYNTHROID) 100 MCG tablet Take 1 tablet by mouth daily.  . nitroGLYCERIN (NITROSTAT) 0.4 MG SL tablet Place 1 tablet (0.4 mg total) under the tongue every 5 (five) minutes as needed for chest pain.  . pantoprazole (PROTONIX) 40 MG tablet TAKE ONE TABLET BY MOUTH DAILY  . sitaGLIPtin (JANUVIA) 50 MG tablet Take 1 tablet (50 mg total) by mouth daily.  . Vitamin D, Ergocalciferol, (DRISDOL) 50000 units CAPS capsule TAKE ONE CAPSULE BY MOUTH ONCE WEEKLY ON MONDAY   No facility-administered encounter medications on file as of 11/01/2018.     ALLERGIES: Allergies  Allergen Reactions  . Cedar     Swelling   . Other     Teriyaki- swelling    VACCINATION STATUS: Immunization History  Administered Date(s) Administered  . Tdap 04/20/2017    Diabetes She presents for her follow-up diabetic visit. She has type 2 diabetes mellitus. Onset time: She was diagnosed at approximate age of 29 years. Her disease course has been worsening. Pertinent negatives for hypoglycemia include no confusion, headaches, nervousness/anxiousness, pallor, seizures, sweats or tremors. Associated symptoms include polydipsia and polyuria. Pertinent negatives for diabetes include no chest pain and no polyphagia. Pertinent negatives for hypoglycemia complications include no required assistance. Symptoms are worsening. Diabetic complications include  heart disease and nephropathy. Risk factors for coronary artery disease include diabetes mellitus, dyslipidemia, family history, hypertension, sedentary lifestyle, post-menopausal and obesity. Current diabetic treatments: She is currently on glimepiride 4 mg p.o. daily.   Her weight is fluctuating minimally. She is following a generally unhealthy diet. When asked about meal planning, she reported none. She has not had a previous visit with a dietitian. She never participates in exercise. Her breakfast blood glucose range is generally >200 mg/dl. Her bedtime blood glucose range is generally >200 mg/dl. Her overall blood glucose range is >200 mg/dl. (She is still averaging 250-300 mg.  Her in office A1c was 12.4%  ) An ACE inhibitor/angiotensin II receptor blocker is not being taken.  Hyperlipidemia This is a chronic problem. The current episode started more than 1 year ago. The problem is uncontrolled. Exacerbating diseases include diabetes, hypothyroidism and obesity. Pertinent negatives include no chest pain, myalgias or shortness of breath. Current antihyperlipidemic treatment includes statins. Risk factors for coronary artery disease include dyslipidemia, diabetes mellitus, hypertension, obesity, family history, a sedentary lifestyle and post-menopausal.  Hypertension This is a chronic problem. The current episode started more than 1 year ago. Pertinent negatives include no chest pain, headaches, palpitations, shortness of breath or sweats. Risk factors for coronary artery disease include dyslipidemia, diabetes mellitus, obesity, post-menopausal state and sedentary lifestyle. Past treatments include calcium channel blockers and beta blockers. Hypertensive end-organ damage includes kidney disease and CAD/MI.     Objective:    There were no vitals taken for this visit.  Wt Readings from Last 3 Encounters:  10/17/18 201 lb (91.2 kg)  09/17/18 202 lb 6.4 oz (91.8 kg)  07/09/18 203 lb (92.1 kg)      CMP     Component Value Date/Time   NA 141 05/24/2017 1111   K 4.3 10/03/2018   CL 97 (L) 05/24/2017 1111   CO2 33 (H) 05/24/2017 1111   GLUCOSE 57 (L) 05/24/2017 1111   BUN 24 (A) 10/03/2018   CREATININE 1.7 (A) 10/03/2018   CREATININE 2.18 (H) 05/24/2017 1111   CALCIUM 9.3 05/24/2017 1111   PROT 7.6 04/20/2017 1418   ALBUMIN 2.9 (L) 10/07/2016 0213   AST 13 04/20/2017 1418   ALT 12 04/20/2017 1418   ALKPHOS 55 10/07/2016 0213   BILITOT 0.5 04/20/2017 1418   GFRNONAA >60 10/14/2016 0606   GFRNONAA >89 10/23/2015 1008   GFRAA >60 10/14/2016 0606   GFRAA >89 10/23/2015 1008     Diabetic Labs (most recent): Lab Results  Component Value Date   HGBA1C 12.4 (A) 10/17/2018   HGBA1C 5.7 (H) 04/20/2017   HGBA1C 7.0 (H) 01/16/2017     Lipid Panel ( most recent) Lipid Panel     Component Value Date/Time   CHOL 231 (H) 04/20/2017 1418   TRIG 301 (H) 04/20/2017 1418   HDL 37 (L) 04/20/2017 1418   CHOLHDL 6.2 (H) 04/20/2017 1418   VLDL 20 10/08/2016 0628   LDLCALC 148 (H) 04/20/2017 1418      Lab Results  Component Value Date   TSH 3.08 04/20/2017   TSH 5.98 (H) 01/16/2017   TSH 7.08 (H) 11/15/2016   TSH 17.574 (H) 10/07/2016   TSH 9.64 (H) 10/23/2015   TSH 7.43 (H) 05/22/2015      Assessment & Plan:   1. Diabetes mellitus without complication (HCC)   - Nimisha Lester Thumma has currently controlled symptomatic type 2 DM since  53 years of age. -She continues to struggle with monitoring of glycemic profile, has nonexistent social support.   -She is reporting random glycemic profile averaging 250-300 mg per DL, recent A1c of 12.4%.   Her recent labs showed CKD.  - I had a long discussion   with her about the progressive nature of diabetes and the pathology behind its complications. -her diabetes is complicated by coronary artery disease, nephropathy , obesity/sedentary life and she remains at a high risk for more acute and chronic complications which include CAD,  CVA, CKD, retinopathy, and neuropathy. These are all discussed in detail with her.  - I have counseled her on diet management and weight loss, by adopting a carbohydrate restricted/protein rich diet. - she  admits there is a room for improvement in her diet and drink choices. -  Suggestion is made for her to avoid simple carbohydrates  from her diet including Cakes, Sweet Desserts / Pastries, Ice Cream, Soda (diet and regular), Sweet Tea, Candies, Chips, Cookies, Sweet Pastries,  Store Bought Juices, Alcohol in Excess of  1-2 drinks a day, Artificial Sweeteners, Coffee Creamer, and "Sugar-free" Products. This will help patient to have stable blood glucose profile and potentially avoid unintended weight gain.   - I encouraged her to switch to  unprocessed or minimally processed complex starch and increased protein intake (animal or plant source), fruits, and vegetables.  - she is advised to stick to a routine mealtimes to eat 3 meals  a day and avoid unnecessary snacks ( to snack only to correct hypoglycemia).   - she will be scheduled with Jearld Fenton, RDN, CDE for individualized diabetes education.  -She is an immediate candidate for insulin treatment in order for her to achieve and maintain control of diabetes to target. -However, this patient has significant cognitive deficit, and she is not not willing to start insulin treatment nor is she complaining of doing it safely.  She will likely need either a home aide to help her with insulin administration or placement in a group home where she could get assistance with skilled placement.   -Her case manager will initiate exploring her options. -In the meantime, in the interest of keeping treatment simple for her, I discussed and increased her glipizide to 10 mg p.o. twice daily with breakfast and supper.  -I discussed and added Januvia 50 mg p.o. daily at breakfast. -  She is advised to continue monitoring blood glucose at least 2 times a day  before breakfast and before bedtime. -She is encouraged to call clinic for readings below 70 or greater than 300 mg per DL. - she is not a candidate for metformin, SGLT2 inhibitors due to concurrent renal insufficiency.  - she will be considered for incretin therapy as appropriate next visit.  - Patient specific target  A1c;  LDL, HDL, Triglycerides, and  Waist Circumference were discussed in detail.  2) Blood Pressure /Hypertension:  she is advised to home monitor blood pressure and report if > 140/90 on 2 separate readings.   she is advised to continue her current medications including carvedilol 3.125 mg twice daily, amlodipine 5 mg p.o. daily.   3) Lipids/Hyperlipidemia:   Review of her recent lipid panel showed uncontrolled  LDL at 148 .  she  is advised to continue atorvastatin 80 mg daily at bedtime.  Side effects and precautions discussed with her.  4)  Weight/Diet: -She has a BMI of 37-   clearly complicating her diabetes care.  I discussed with her the fact that loss of 5 - 10% of her  current body weight will have the most impact on her diabetes management.  CDE Consult will be initiated . Exercise, and detailed carbohydrates information provided  -  detailed on discharge instructions.  5) hypothyroidism: Her most recent  TSH was on target at 3.08.  She is advised to continue levothyroxine 75 mcg p.o. every morning.  - We discussed about the correct intake of her thyroid hormone, on empty stomach at fasting, with water, separated by at least 30 minutes from breakfast and other medications,  and separated by more than 4 hours from calcium, iron, multivitamins, acid reflux medications (PPIs). -Patient is made aware of the fact that thyroid hormone replacement is needed for life, dose to be adjusted by periodic monitoring of thyroid function tests.   6) Chronic Care/Health Maintenance:  -she  is on  Statin medications and  is encouraged to initiate and continue to follow up with  Ophthalmology, Dentist,  Podiatrist at least yearly or according to recommendations, and advised to   stay away from smoking. I have recommended yearly flu vaccine and pneumonia vaccine at least every 5 years; moderate intensity exercise for up to 150 minutes weekly; and  sleep for at least 7 hours a day.  - she is  advised to maintain close follow up with Wyatt Haste, NP for primary care needs, as well as her other providers for optimal and coordinated care. - Patient Care Time Today:  25 min, of which >50% was spent in  counseling and the rest reviewing her  current and  previous labs/studies, previous treatments, her blood glucose readings, and medications' doses and developing a plan for long-term care based on the latest recommendations for standards of care.   Isra Lindy participated in the discussions, expressed understanding, and voiced agreement with the above plans.  All questions were answered to her satisfaction. she is encouraged to contact clinic should she have any questions or concerns prior to her return visit.   Follow up plan: - Return in about 3 months (around 01/31/2019) for Bring Meter and Logs- A1c in Office.  Glade Lloyd, MD [facility] [address] [place], [place] [address] Phone: [phone]  Fax: [phone]    11/01/2018, 4:37 PM  This note was partially dictated with voice recognition software. Similar sounding words can be transcribed inadequately or may not  be corrected upon review.

## 2018-11-14 ENCOUNTER — Ambulatory Visit: Payer: Medicare HMO | Admitting: Nutrition

## 2018-11-20 ENCOUNTER — Encounter: Payer: Self-pay | Admitting: Nutrition

## 2018-11-23 ENCOUNTER — Telehealth: Payer: Self-pay

## 2018-11-23 DIAGNOSIS — E119 Type 2 diabetes mellitus without complications: Secondary | ICD-10-CM

## 2018-11-23 MED ORDER — GLUCOSE BLOOD VI STRP: 1.0000 | ORAL_STRIP | Freq: Two times a day (BID) | Status: AC

## 2018-11-23 NOTE — Telephone Encounter (Signed)
LeighAnn Kayloni Rocco, CMA  

## 2018-12-13 ENCOUNTER — Other Ambulatory Visit: Payer: Self-pay

## 2018-12-13 MED ORDER — GLUCOSE BLOOD VI STRP: 1.0000 | ORAL_STRIP | Freq: Two times a day (BID) | 3 refills | Status: DC

## 2018-12-18 ENCOUNTER — Other Ambulatory Visit: Payer: Self-pay

## 2018-12-18 MED ORDER — GLUCOSE BLOOD VI STRP: 1.0000 | ORAL_STRIP | Freq: Two times a day (BID) | 2 refills | Status: AC

## 2018-12-18 MED ORDER — BLOOD GLUCOSE METER KIT: PACK | 5 refills | Status: DC

## 2018-12-18 NOTE — Telephone Encounter (Signed)
Prodigy talking meter test strips sent to [facility].

## 2019-01-23 DIAGNOSIS — H548 Legal blindness, as defined in USA: Secondary | ICD-10-CM | POA: Diagnosis not present

## 2019-01-23 DIAGNOSIS — I2510 Atherosclerotic heart disease of native coronary artery without angina pectoris: Secondary | ICD-10-CM | POA: Diagnosis not present

## 2019-01-23 DIAGNOSIS — K219 Gastro-esophageal reflux disease without esophagitis: Secondary | ICD-10-CM | POA: Diagnosis not present

## 2019-01-23 DIAGNOSIS — E785 Hyperlipidemia, unspecified: Secondary | ICD-10-CM | POA: Diagnosis not present

## 2019-01-23 DIAGNOSIS — I10 Essential (primary) hypertension: Secondary | ICD-10-CM | POA: Diagnosis not present

## 2019-01-23 DIAGNOSIS — Z6837 Body mass index (BMI) 37.0-37.9, adult: Secondary | ICD-10-CM | POA: Diagnosis not present

## 2019-01-23 DIAGNOSIS — E039 Hypothyroidism, unspecified: Secondary | ICD-10-CM | POA: Diagnosis not present

## 2019-01-23 DIAGNOSIS — E1142 Type 2 diabetes mellitus with diabetic polyneuropathy: Secondary | ICD-10-CM | POA: Diagnosis not present

## 2019-02-01 ENCOUNTER — Other Ambulatory Visit: Payer: Self-pay

## 2019-02-01 ENCOUNTER — Ambulatory Visit (INDEPENDENT_AMBULATORY_CARE_PROVIDER_SITE_OTHER): Payer: Medicare HMO | Admitting: "Endocrinology

## 2019-02-01 ENCOUNTER — Encounter: Payer: Self-pay | Admitting: "Endocrinology

## 2019-02-01 VITALS — BP 142/84 | HR 70 | Ht 61.0 in | Wt 198.0 lb

## 2019-02-01 DIAGNOSIS — E038 Other specified hypothyroidism: Secondary | ICD-10-CM

## 2019-02-01 DIAGNOSIS — E1121 Type 2 diabetes mellitus with diabetic nephropathy: Secondary | ICD-10-CM

## 2019-02-01 DIAGNOSIS — E559 Vitamin D deficiency, unspecified: Secondary | ICD-10-CM | POA: Diagnosis not present

## 2019-02-01 DIAGNOSIS — I10 Essential (primary) hypertension: Secondary | ICD-10-CM | POA: Diagnosis not present

## 2019-02-01 DIAGNOSIS — E782 Mixed hyperlipidemia: Secondary | ICD-10-CM | POA: Diagnosis not present

## 2019-02-01 DIAGNOSIS — E1122 Type 2 diabetes mellitus with diabetic chronic kidney disease: Secondary | ICD-10-CM

## 2019-02-01 DIAGNOSIS — E063 Autoimmune thyroiditis: Secondary | ICD-10-CM

## 2019-02-01 DIAGNOSIS — N1831 Chronic kidney disease, stage 3a: Secondary | ICD-10-CM

## 2019-02-01 LAB — POCT GLYCOSYLATED HEMOGLOBIN (HGB A1C)

## 2019-02-01 LAB — HEMOGLOBIN A1C: Hemoglobin A1C: 8.2

## 2019-02-01 NOTE — Patient Instructions (Signed)

## 2019-02-01 NOTE — Progress Notes (Signed)
02/01/2019, 12:40 PM        Endocrinology follow-up note                                       She was accompanied by her care coordinator  Mr. Nolon Nations. All issues noted in this document were discussed and addressed.   Subjective:    Patient ID: Monica Carlson, female    DOB: December 13, 1965.  Monica Carlson is being seen for follow-up in  management of currently uncontrolled symptomatic diabetes requested by  Wyatt Haste, NP.   Past Medical History:  Diagnosis Date  . Cervical cancer ([place])    cryotherapy and colposcopy/[place], [place]; 1991  . CHF (congestive heart failure) ([place]) 07-2014  . Diabetes mellitus without complication ([place]) 0923  . High cholesterol   . Hypertension   . MI (myocardial infarction) ([place])    2004  . Stroke ([place]) 09/2014, 2013  . Vaginal Pap smear, abnormal     Past Surgical History:  Procedure Laterality Date  . CHOLECYSTECTOMY  1992  . CORONARY STENT PLACEMENT  2004  . TUBAL LIGATION      Social History   Socioeconomic History  . Marital status: Legally Separated    Spouse name: Not on file  . Number of children: Not on file  . Years of education: Not on file  . Highest education level: Not on file  Occupational History  . Not on file  Tobacco Use  . Smoking status: Never Smoker  . Smokeless tobacco: Never Used  Substance and Sexual Activity  . Alcohol use: No  . Drug use: No  . Sexual activity: Yes    Birth control/protection: None, Surgical    Comment: tubal  Other Topics Concern  . Not on file  Social History Narrative   Lives alone.    Has some friends.    Neighbors.    Social Determinants of Health   Financial Resource Strain:   . Difficulty of Paying Living Expenses: Not on file  Food Insecurity:   . Worried About Charity fundraiser in the Last Year: Not on file  . Ran Out of Food in the Last Year: Not on file  Transportation Needs:   .  Lack of Transportation (Medical): Not on file  . Lack of Transportation (Non-Medical): Not on file  Physical Activity:   . Days of Exercise per Week: Not on file  . Minutes of Exercise per Session: Not on file  Stress:   . Feeling of Stress : Not on file  Social Connections:   . Frequency of Communication with Friends and Family: Not on file  . Frequency of Social Gatherings with Friends and Family: Not on file  . Attends Religious Services: Not on file  . Active Member of Clubs or Organizations: Not on file  . Attends Archivist Meetings: Not on file  . Marital Status: Not on file    Family History  Problem Relation Age of Onset  . Migraines Mother   . Heart disease Father   . Hypertension Father   .  Diabetes Father   . Migraines Maternal Grandfather   . Heart attack Maternal Grandfather   . Lung cancer Paternal Grandmother   . Colon cancer Neg Hx   . Colon polyps Neg Hx     Outpatient Encounter Medications as of 02/01/2019  Medication Sig  . amLODipine (NORVASC) 10 MG tablet Take 1 tablet (10 mg total) by mouth daily.  Marland Kitchen aspirin 325 MG EC tablet Take 1 tablet (325 mg total) by mouth daily.  Marland Kitchen atorvastatin (LIPITOR) 80 MG tablet TAKE ONE TABLET BY MOUTH DAILY  . blood glucose meter kit and supplies Dispense based on patient and insurance preference. Use up to 2 times daily as directed. (FOR ICD-10 E11.65).  . Blood Glucose Monitoring Suppl (ACCU-CHEK GUIDE ME) w/Device KIT 1 Piece by Does not apply route as directed.  . carvedilol (COREG) 3.125 MG tablet Take 1 tablet (3.125 mg total) by mouth 2 (two) times daily with a meal.  . clopidogrel (PLAVIX) 75 MG tablet TAKE ONE TABLET BY MOUTH EVERY DAY WITH BREAKFAST  . furosemide (LASIX) 40 MG tablet TAKE ONE TABLET BY MOUTH TWICE DAILY  . gabapentin (NEURONTIN) 300 MG capsule TAKE ONE CAPSULE BY MOUTH TWICE DAILY  . glipiZIDE (GLUCOTROL) 10 MG tablet Take 1 tablet (10 mg total) by mouth 2 (two) times daily before a  meal.  . glucose blood (ACCU-CHEK GUIDE) test strip Use as instructed 4 x daily. e11.65  . glucose blood test strip 1 each by Other route 2 (two) times daily. Use as instructed bid. E11.65 Prodigy Meter  . glucose blood test strip 1 each by Other route 2 (two) times daily. Use as instructed bid. E11.65  . Lancets Thin MISC 1 each by Does not apply route 4 (four) times daily. E11.65 Pt has Prodigy meter  . levothyroxine (SYNTHROID) 100 MCG tablet Take 1 tablet by mouth daily.  . nitroGLYCERIN (NITROSTAT) 0.4 MG SL tablet Place 1 tablet (0.4 mg total) under the tongue every 5 (five) minutes as needed for chest pain.  . pantoprazole (PROTONIX) 40 MG tablet TAKE ONE TABLET BY MOUTH DAILY  . sitaGLIPtin (JANUVIA) 50 MG tablet Take 1 tablet (50 mg total) by mouth daily.  . Vitamin D, Ergocalciferol, (DRISDOL) 50000 units CAPS capsule TAKE ONE CAPSULE BY MOUTH ONCE WEEKLY ON MONDAY   Facility-Administered Encounter Medications as of 02/01/2019  Medication  . glucose blood test strip STRP 1 each    ALLERGIES: Allergies  Allergen Reactions  . [place]edar     Swelling   . Other     Teriyaki- swelling    VACCINATION STATUS: Immunization History  Administered Date(s) Administered  . Tdap 04/20/2017    Diabetes She presents for her follow-up diabetic visit. She has type 2 diabetes mellitus. Onset time: She was diagnosed at approximate age of 43 years. Her disease course has been improving. Pertinent negatives for hypoglycemia include no confusion, headaches, nervousness/anxiousness, pallor, seizures, sweats or tremors. Associated symptoms include polyuria. Pertinent negatives for diabetes include no chest pain, no polydipsia and no polyphagia. Pertinent negatives for hypoglycemia complications include no required assistance. Symptoms are improving. Diabetic complications include heart disease and nephropathy. Risk factors for coronary artery disease include diabetes mellitus, dyslipidemia, family  history, hypertension, sedentary lifestyle, post-menopausal and obesity. Current diabetic treatments: She is currently on glimepiride 4 mg p.o. daily. Her weight is decreasing steadily (She lost 5 pounds overall.). She is following a generally unhealthy diet. When asked about meal planning, she reported none. She has not had a previous  visit with a dietitian. She never participates in exercise. Her breakfast blood glucose range is generally 180-200 mg/dl. Her bedtime blood glucose range is generally >200 mg/dl. Her overall blood glucose range is >200 mg/dl. (She presents with a log book showing near target fasting glycemic profile, above target evening numbers.  No hypoglycemia reported nor documented.  Her point-of-care A1c was 8.2% significantly improving from 12.4%.    ) An ACE inhibitor/angiotensin II receptor blocker is not being taken.  Hyperlipidemia This is a chronic problem. The current episode started more than 1 year ago. The problem is uncontrolled. Exacerbating diseases include diabetes, hypothyroidism and obesity. Pertinent negatives include no chest pain, myalgias or shortness of breath. Current antihyperlipidemic treatment includes statins. Risk factors for coronary artery disease include dyslipidemia, diabetes mellitus, hypertension, obesity, family history, a sedentary lifestyle and post-menopausal.  Hypertension This is a chronic problem. The current episode started more than 1 year ago. Pertinent negatives include no chest pain, headaches, palpitations, shortness of breath or sweats. Risk factors for coronary artery disease include dyslipidemia, diabetes mellitus, obesity, post-menopausal state and sedentary lifestyle. Past treatments include calcium channel blockers and beta blockers. Hypertensive end-organ damage includes kidney disease and CAD/MI.     Objective:    BP (!) 142/84   Pulse 70   Ht _0  (1.549 m)   Wt 198 lb (89.8 kg)   BMI 37.41 kg/m   Wt Readings from Last 3  Encounters:  02/01/19 198 lb (89.8 kg)  10/17/18 201 lb (91.2 kg)  09/17/18 202 lb 6.4 oz (91.8 kg)     CMP     Component Value Date/Time   NA 141 05/24/2017 1111   K 4.3 10/03/2018 0000   CL 97 (L) 05/24/2017 1111   CO2 33 (H) 05/24/2017 1111   GLUCOSE 57 (L) 05/24/2017 1111   BUN 24 (A) 10/03/2018 0000   CREATININE 1.7 (A) 10/03/2018 0000   CREATININE 2.18 (H) 05/24/2017 1111   CALCIUM 9.3 05/24/2017 1111   PROT 7.6 04/20/2017 1418   ALBUMIN 2.9 (L) 10/07/2016 0213   AST 13 04/20/2017 1418   ALT 12 04/20/2017 1418   ALKPHOS 55 10/07/2016 0213   BILITOT 0.5 04/20/2017 1418   GFRNONAA >60 10/14/2016 0606   GFRNONAA >89 10/23/2015 1008   GFRAA >60 10/14/2016 0606   GFRAA >89 10/23/2015 1008     Diabetic Labs (most recent): Lab Results  Component Value Date   HGBA1C 8.2 02/01/2019   HGBA1C 12.4 (A) 10/17/2018   HGBA1C 5.7 (H) 04/20/2017     Lipid Panel ( most recent) Lipid Panel     Component Value Date/Time   CHOL 231 (H) 04/20/2017 1418   TRIG 301 (H) 04/20/2017 1418   HDL 37 (L) 04/20/2017 1418   CHOLHDL 6.2 (H) 04/20/2017 1418   VLDL 20 10/08/2016 0628   LDLCALC 148 (H) 04/20/2017 1418      Lab Results  Component Value Date   TSH 3.08 04/20/2017   TSH 5.98 (H) 01/16/2017   TSH 7.08 (H) 11/15/2016   TSH 17.574 (H) 10/07/2016   TSH 9.64 (H) 10/23/2015   TSH 7.43 (H) 05/22/2015      Assessment & Plan:   1. Diabetes mellitus without complication ([place])   - Monica Carlson has currently controlled symptomatic type 2 DM since  53 years of age. -She presents with improving glycemic profile.  She has very little social support.  Her fasting glycemic profile is near target, above target evening blood glucose readings.  Her point-of-care A1c was 8.2% improving from 12.4%.   -She has complications including CKD.    -She is accompanied by her care coordinator Mr. [name], I had a long discussion with her about the progressive nature of diabetes and the  pathology behind its complications. -her diabetes is complicated by coronary artery disease, nephropathy , obesity/sedentary life and she remains at a high risk for more acute and chronic complications which include CAD, CVA, CKD, retinopathy, and neuropathy. These are all discussed in detail with her.  - I have counseled her on diet management and weight loss, by adopting a carbohydrate restricted/protein rich diet.  - she  admits there is a room for improvement in her diet and drink choices. -  Suggestion is made for her to avoid simple carbohydrates  from her diet including Cakes, Sweet Desserts / Pastries, Ice Cream, Soda (diet and regular), Sweet Tea, Candies, Chips, Cookies, Sweet Pastries,  Store Bought Juices, Alcohol in Excess of  1-2 drinks a day, Artificial Sweeteners, Coffee Creamer, and "Sugar-free" Products. This will help patient to have stable blood glucose profile and potentially avoid unintended weight gain. - I encouraged her to switch to  unprocessed or minimally processed complex starch and increased protein intake (animal or plant source), fruits, and vegetables.  - she is advised to stick to a routine mealtimes to eat 3 meals  a day and avoid unnecessary snacks ( to snack only to correct hypoglycemia).   - she will be scheduled with Jearld Fenton, RDN, CDE for individualized diabetes education.  - this patient has significant cognitive deficit, and she is has not been willing to start insulin treatment.   -In the meantime, she has responded to glipizide and Januvia combination treatment. -She is advised to continue glipizide 10 mg p.o. twice daily with breakfast and supper, Januvia 50 mg p.o. daily at breakfast. -She is willing to continue to monitor blood glucose twice a day-daily before breakfast and before going to bed.  -She is encouraged to call clinic for readings below 70 or greater than 300 mg per DL. - she is not a candidate for metformin, SGLT2 inhibitors due to  concurrent renal insufficiency.  - Patient specific target  A1c;  LDL, HDL, Triglycerides, and  Waist Circumference were discussed in detail.  2) Blood Pressure /Hypertension: Her blood pressure is not controlled to target this morning.  She missed her morning medications today.   she is advised to continue her current medications including carvedilol 3.125 mg twice daily, amlodipine 5 mg p.o. daily.   3) Lipids/Hyperlipidemia:   Review of her recent lipid panel showed uncontrolled  LDL at 148 .  she  is advised to continue atorvastatin 80 mg p.o. nightly.  Side effects and precautions discussed with her.  4)  Weight/Diet: -She has a BMI of 38-   clearly complicating her diabetes care.  I discussed with her the fact that loss of 5 - 10% of her  current body weight will have the most impact on her diabetes management.  CDE Consult will be initiated . Exercise, and detailed carbohydrates information provided  -  detailed on discharge instructions.  5) hypothyroidism: Her most recent TSH was on target at 3.08.  She is advised to continue levothyroxine 75 mcg p.o. daily before breakfast.   - We discussed about the correct intake of her thyroid hormone, on empty stomach at fasting, with water, separated by at least 30 minutes from breakfast and other medications,  and separated by more than  4 hours from calcium, iron, multivitamins, acid reflux medications (PPIs). -Patient is made aware of the fact that thyroid hormone replacement is needed for life, dose to be adjusted by periodic monitoring of thyroid function tests.   6) Chronic Care/Health Maintenance:  -she  is on  Statin medications and  is encouraged to initiate and continue to follow up with Ophthalmology, Dentist,  Podiatrist at least yearly or according to recommendations, and advised to   stay away from smoking. I have recommended yearly flu vaccine and pneumonia vaccine at least every 5 years; moderate intensity exercise for up to 150  minutes weekly; and  sleep for at least 7 hours a day.  - she is  advised to maintain close follow up with Wyatt Haste, NP for primary care needs, as well as her other providers for optimal and coordinated care.  - Patient Care Time Today:  25 min, of which >50% was spent in  counseling and the rest reviewing her  current and  previous labs/studies, previous treatments, her blood glucose readings, and medications' doses and developing a plan for long-term care based on the latest recommendations for standards of care.   Monica Carlson participated in the discussions, expressed understanding, and voiced agreement with the above plans.  All questions were answered to her satisfaction. she is encouraged to contact clinic should she have any questions or concerns prior to her return visit.   Follow up plan: - Return in about 3 months (around 05/02/2019) for Follow up with Pre-visit Labs, Next Visit A1c in Office.  Glade Lloyd, MD [facility] [address] [place], [place] [address] Phone: [phone]  Fax: [phone]    02/01/2019, 12:40 PM  This note was partially dictated with voice recognition software. Similar sounding words can be transcribed inadequately or may not  be corrected upon review.

## 2019-02-07 ENCOUNTER — Other Ambulatory Visit: Payer: Self-pay | Admitting: "Endocrinology

## 2019-04-09 ENCOUNTER — Ambulatory Visit: Payer: Medicare HMO | Admitting: "Endocrinology

## 2019-04-15 ENCOUNTER — Other Ambulatory Visit: Payer: Self-pay | Admitting: Cardiovascular Disease

## 2019-04-30 ENCOUNTER — Other Ambulatory Visit: Payer: Self-pay | Admitting: "Endocrinology

## 2019-05-02 LAB — COMPREHENSIVE METABOLIC PANEL
ALT: 8 IU/L (ref 0–32)
AST: 10 IU/L (ref 0–40)
Albumin/Globulin Ratio: 1.2 (ref 1.2–2.2)
Albumin: 4 g/dL (ref 3.8–4.9)
Alkaline Phosphatase: 124 IU/L — ABNORMAL HIGH (ref 39–117)
BUN/Creatinine Ratio: 10 (ref 9–23)
BUN: 23 mg/dL (ref 6–24)
Bilirubin Total: 0.3 mg/dL (ref 0.0–1.2)
CO2: 26 mmol/L (ref 20–29)
Calcium: 9.6 mg/dL (ref 8.7–10.2)
Chloride: 99 mmol/L (ref 96–106)
Creatinine, Ser: 2.22 mg/dL — ABNORMAL HIGH (ref 0.57–1.00)
GFR calc Af Amer: 28 mL/min/{1.73_m2} — ABNORMAL LOW (ref >59)
GFR calc non Af Amer: 24 mL/min/{1.73_m2} — ABNORMAL LOW (ref >59)
Globulin, Total: 3.3 g/dL (ref 1.5–4.5)
Glucose: 141 mg/dL — ABNORMAL HIGH (ref 65–99)
Potassium: 3.9 mmol/L (ref 3.5–5.2)
Sodium: 140 mmol/L (ref 134–144)
Total Protein: 7.3 g/dL (ref 6.0–8.5)

## 2019-05-02 LAB — SPECIMEN STATUS REPORT

## 2019-05-02 LAB — MICROALBUMIN, URINE: Microalbumin, Urine: 81.3 ug/mL

## 2019-05-02 LAB — CALCITRIOL (1,25 DI-OH VIT D): Vit D, 1,25-Dihydroxy: 43.9 pg/mL (ref 19.9–79.3)

## 2019-05-02 LAB — HGB A1C W/O EAG: Hgb A1c MFr Bld: 7.7 % — ABNORMAL HIGH (ref 4.8–5.6)

## 2019-05-02 LAB — T4, FREE: Free T4: 1.2 ng/dL (ref 0.82–1.77)

## 2019-05-02 LAB — TSH: TSH: 5.15 u[IU]/mL — ABNORMAL HIGH (ref 0.450–4.500)

## 2019-05-07 ENCOUNTER — Encounter: Payer: Medicare Other | Attending: "Endocrinology | Admitting: Nutrition

## 2019-05-07 ENCOUNTER — Ambulatory Visit (INDEPENDENT_AMBULATORY_CARE_PROVIDER_SITE_OTHER): Payer: Medicare Other | Admitting: "Endocrinology

## 2019-05-07 ENCOUNTER — Encounter: Payer: Self-pay | Admitting: "Endocrinology

## 2019-05-07 ENCOUNTER — Encounter: Payer: Self-pay | Admitting: Nutrition

## 2019-05-07 ENCOUNTER — Other Ambulatory Visit: Payer: Self-pay

## 2019-05-07 VITALS — Wt 200.0 lb

## 2019-05-07 VITALS — BP 124/69 | HR 62 | Ht 61.0 in | Wt 200.0 lb

## 2019-05-07 DIAGNOSIS — E669 Obesity, unspecified: Secondary | ICD-10-CM | POA: Diagnosis present

## 2019-05-07 DIAGNOSIS — E782 Mixed hyperlipidemia: Secondary | ICD-10-CM | POA: Diagnosis not present

## 2019-05-07 DIAGNOSIS — E7800 Pure hypercholesterolemia, unspecified: Secondary | ICD-10-CM | POA: Diagnosis not present

## 2019-05-07 DIAGNOSIS — E559 Vitamin D deficiency, unspecified: Secondary | ICD-10-CM | POA: Diagnosis not present

## 2019-05-07 DIAGNOSIS — E038 Other specified hypothyroidism: Secondary | ICD-10-CM | POA: Diagnosis not present

## 2019-05-07 DIAGNOSIS — Z8673 Personal history of transient ischemic attack (TIA), and cerebral infarction without residual deficits: Secondary | ICD-10-CM | POA: Insufficient documentation

## 2019-05-07 DIAGNOSIS — I10 Essential (primary) hypertension: Secondary | ICD-10-CM

## 2019-05-07 DIAGNOSIS — E1122 Type 2 diabetes mellitus with diabetic chronic kidney disease: Secondary | ICD-10-CM

## 2019-05-07 DIAGNOSIS — E119 Type 2 diabetes mellitus without complications: Secondary | ICD-10-CM | POA: Insufficient documentation

## 2019-05-07 DIAGNOSIS — N1831 Chronic kidney disease, stage 3a: Secondary | ICD-10-CM

## 2019-05-07 DIAGNOSIS — E063 Autoimmune thyroiditis: Secondary | ICD-10-CM

## 2019-05-07 DIAGNOSIS — E1121 Type 2 diabetes mellitus with diabetic nephropathy: Secondary | ICD-10-CM | POA: Diagnosis not present

## 2019-05-07 MED ORDER — LEVOTHYROXINE SODIUM 112 MCG PO TABS: 112.0000 ug | ORAL_TABLET | Freq: Every day | ORAL | 1 refills | Status: DC

## 2019-05-07 NOTE — Progress Notes (Signed)
  Medical Nutrition Therapy:  Appt start time: 2831 end time:  1430 Assessment:  Primary concerns today: pre Dm and Obesity, Mixed Hyperlipidemia. Here with her social worker, Reyne Dumas. Visually impaired and legally blind. Januvia.and Glipizide. To see Dr. Dorris Fetch , Endocrinology today also. A1C improved. Eating more vegetables and eating nuts instead of chips and snacks. Drinking water. Feels better. Denies any symptoms of low blood sugars. CKD. Sees Nephrologist. Avoids salt and trying to avoid processed foods.   Lab Results  Component Value Date   HGBA1C 7.7 (H) 04/30/2019   CMP Latest Ref Rng & Units 04/30/2019 10/03/2018 05/24/2017  Glucose 65 - 99 mg/dL 141(H) - 57(L)  BUN 6 - 24 mg/dL 23 24(A) 40(H)  Creatinine 0.57 - 1.00 mg/dL 2.22(H) 1.7(A) 2.18(H)  Sodium 134 - 144 mmol/L 140 - 141  Potassium 3.5 - 5.2 mmol/L 3.9 4.3 4.2  Chloride 96 - 106 mmol/L 99 - 97(L)  CO2 20 - 29 mmol/L 26 - 33(H)  Calcium 8.7 - 10.2 mg/dL 9.6 - 9.3  Total Protein 6.0 - 8.5 g/dL 7.3 - -  Total Bilirubin 0.0 - 1.2 mg/dL 0.3 - -  Alkaline Phos 39 - 117 IU/L 124(H) - -  AST 0 - 40 IU/L 10 - -  ALT 0 - 32 IU/L 8 - -    Preferred Learning Style:    No preference indicated   Learning Readiness:  Ready  Change in progress   MEDICATIONS:    DIETARY INTAKE:  24-hr recall:  B ( AM):  Egg.   L Sandwich banana or ham, chips, water Snk ( PM): D ( PM): Fish, baked potato and salad, water Snk ( PM) Beverages water  Usual physical activity:  Estimated energy needs: 1200  calories 135 g carbohydrates 90 g protein 33 g fat  Progress Towards Goal(s):  In progress.   Nutritional Diagnosis:  NB-1.1 Food and nutrition-related knowledge deficit As related to  Obesity and prediabetes.  As evidenced by A1C >5.7 and BMI > 30 and history of a HA and CHF.    Intervention: Nutrition and  Pre Diabetes education provided on My Plate, CHO counting, meal planning, portion sizes, timing of meals, avoiding  snacks between meals taking medications as prescribed, benefits of exercising 30 minutes per day and prevention of  DM.'. Healthy weight loss tips.   Goals  Keep up the good job! Keep eating carrots instead of chips for snacks.. Increase fresh fruits and vegetables Keep drinking water Keep testing blood sugars in and less than 180 before bed. Goals A1C is 7% or less. . Teaching Method Utilized:  Visual Auditory Hands on  Handouts given during visit include:  Verbally went over My Plate  Barriers to learning/adherence to lifestyle change: none  Demonstrated degree of understanding via:  Teach Back   Monitoring/Evaluation:  Dietary intake, exercise, meal planning, and body weight in 3 month(s).

## 2019-05-07 NOTE — Patient Instructions (Signed)

## 2019-05-07 NOTE — Progress Notes (Signed)
05/07/2019, 4:00 PM        Endocrinology follow-up note                                       She is accompanied by her care coordinator  Mr. Nolon Nations. All issues noted in this document were discussed and addressed.   Subjective:    Patient ID: Monica Carlson, female    DOB: [birth year].  Monica Carlson is being seen for follow-up in  management of currently uncontrolled symptomatic diabetes requested by  Wyatt Haste, NP.   Past Medical History:  Diagnosis Date  . Cervical cancer ([place])    cryotherapy and colposcopy/[place], [place]; 1991  . CHF (congestive heart failure) ([place]) 07-2014  . Diabetes mellitus without complication ([place]) 0233  . High cholesterol   . Hypertension   . MI (myocardial infarction) ([place])    2004  . Stroke ([place]) 09/2014, 2013  . Vaginal Pap smear, abnormal     Past Surgical History:  Procedure Laterality Date  . CHOLECYSTECTOMY  1992  . CORONARY STENT PLACEMENT  2004  . TUBAL LIGATION      Social History   Socioeconomic History  . Marital status: Legally Separated    Spouse name: Not on file  . Number of children: Not on file  . Years of education: Not on file  . Highest education level: Not on file  Occupational History  . Not on file  Tobacco Use  . Smoking status: Never Smoker  . Smokeless tobacco: Never Used  Substance and Sexual Activity  . Alcohol use: No  . Drug use: No  . Sexual activity: Yes    Birth control/protection: None, Surgical    Comment: tubal  Other Topics Concern  . Not on file  Social History Narrative   Lives alone.    Has some friends.    Neighbors.    Social Determinants of Health   Financial Resource Strain:   . Difficulty of Paying Living Expenses:   Food Insecurity:   . Worried About Charity fundraiser in the Last Year:   . Arboriculturist in the Last Year:   Transportation Needs:   . Film/video editor (Medical):    Marland Kitchen Lack of Transportation (Non-Medical):   Physical Activity:   . Days of Exercise per Week:   . Minutes of Exercise per Session:   Stress:   . Feeling of Stress :   Social Connections:   . Frequency of Communication with Friends and Family:   . Frequency of Social Gatherings with Friends and Family:   . Attends Religious Services:   . Active Member of Clubs or Organizations:   . Attends Archivist Meetings:   Marland Kitchen Marital Status:     Family History  Problem Relation Age of Onset  . Migraines Mother   . Heart disease Father   . Hypertension Father   . Diabetes Father   . Migraines Maternal Grandfather   . Heart attack Maternal Grandfather   . Lung cancer Paternal Grandmother   . Colon cancer Neg  Hx   . Colon polyps Neg Hx     Outpatient Encounter Medications as of 05/07/2019  Medication Sig  . amLODipine (NORVASC) 10 MG tablet TAKE 1 TABLET BY MOUTH DAILY  . aspirin 325 MG EC tablet Take 1 tablet (325 mg total) by mouth daily.  Marland Kitchen atorvastatin (LIPITOR) 80 MG tablet TAKE ONE TABLET BY MOUTH DAILY  . blood glucose meter kit and supplies Dispense based on patient and insurance preference. Use up to 2 times daily as directed. (FOR ICD-10 E11.65).  . Blood Glucose Monitoring Suppl (ACCU-CHEK GUIDE ME) w/Device KIT 1 Piece by Does not apply route as directed.  . carvedilol (COREG) 3.125 MG tablet Take 1 tablet (3.125 mg total) by mouth 2 (two) times daily with a meal.  . clopidogrel (PLAVIX) 75 MG tablet TAKE ONE TABLET BY MOUTH EVERY DAY WITH BREAKFAST  . furosemide (LASIX) 40 MG tablet TAKE ONE TABLET BY MOUTH TWICE DAILY  . gabapentin (NEURONTIN) 300 MG capsule TAKE ONE CAPSULE BY MOUTH TWICE DAILY  . glipiZIDE (GLUCOTROL) 10 MG tablet TAKE 1 TABLET BY MOUTH TWICE DAILY BEFORE a meal  . glucose blood (ACCU-CHEK GUIDE) test strip Use as instructed 4 x daily. e11.65  . glucose blood test strip 1 each by Other route 2 (two) times daily. Use as instructed bid. E11.65 Prodigy  Meter  . glucose blood test strip 1 each by Other route 2 (two) times daily. Use as instructed bid. E11.65  . JANUVIA 50 MG tablet TAKE 1 TABLET BY MOUTH EVERY DAY  . Lancets Thin MISC 1 each by Does not apply route 4 (four) times daily. E11.65 Pt has Prodigy meter  . levothyroxine (SYNTHROID) 112 MCG tablet Take 1 tablet (112 mcg total) by mouth daily.  . nitroGLYCERIN (NITROSTAT) 0.4 MG SL tablet Place 1 tablet (0.4 mg total) under the tongue every 5 (five) minutes as needed for chest pain.  . pantoprazole (PROTONIX) 40 MG tablet TAKE ONE TABLET BY MOUTH DAILY  . Vitamin D, Ergocalciferol, (DRISDOL) 50000 units CAPS capsule TAKE ONE CAPSULE BY MOUTH ONCE WEEKLY ON MONDAY  . [DISCONTINUED] levothyroxine (SYNTHROID) 100 MCG tablet Take 1 tablet by mouth daily.   Facility-Administered Encounter Medications as of 05/07/2019  Medication  . glucose blood test strip STRP 1 each    ALLERGIES: Allergies  Allergen Reactions  . [place]     Swelling   . Other     Teriyaki- swelling    VACCINATION STATUS: Immunization History  Administered Date(s) Administered  . Tdap 04/20/2017    Diabetes She presents for her follow-up diabetic visit. She has type 2 diabetes mellitus. Onset time: She was diagnosed at approximate age of 47 years. Her disease course has been improving. Pertinent negatives for hypoglycemia include no confusion, headaches, nervousness/anxiousness, pallor, seizures, sweats or tremors. Associated symptoms include polyuria. Pertinent negatives for diabetes include no chest pain, no polydipsia and no polyphagia. Pertinent negatives for hypoglycemia complications include no required assistance. Symptoms are improving. Diabetic complications include heart disease and nephropathy. Risk factors for coronary artery disease include diabetes mellitus, dyslipidemia, family history, hypertension, sedentary lifestyle, post-menopausal and obesity. Current diabetic treatments: She is currently on  glimepiride 4 mg p.o. daily. Her weight is stable. She is following a generally unhealthy diet. When asked about meal planning, she reported none. She has not had a previous visit with a dietitian. She never participates in exercise. Her breakfast blood glucose range is generally 140-180 mg/dl. Her bedtime blood glucose range is generally 180-200 mg/dl. Her  overall blood glucose range is 180-200 mg/dl. (She presents with a log book showing near target fasting glycemic profile, above target evening numbers.  No hypoglycemia reported nor documented.  Her point-of-care A1c was 8.2% significantly improving from 12.4%.    ) An ACE inhibitor/angiotensin II receptor blocker is not being taken.  Hyperlipidemia This is a chronic problem. The current episode started more than 1 year ago. The problem is uncontrolled. Exacerbating diseases include diabetes, hypothyroidism and obesity. Pertinent negatives include no chest pain, myalgias or shortness of breath. Current antihyperlipidemic treatment includes statins. Risk factors for coronary artery disease include dyslipidemia, diabetes mellitus, hypertension, obesity, family history, a sedentary lifestyle and post-menopausal.  Hypertension This is a chronic problem. The current episode started more than 1 year ago. Pertinent negatives include no chest pain, headaches, palpitations, shortness of breath or sweats. Risk factors for coronary artery disease include dyslipidemia, diabetes mellitus, obesity, post-menopausal state and sedentary lifestyle. Past treatments include calcium channel blockers and beta blockers. Hypertensive end-organ damage includes kidney disease and CAD/MI.     Objective:    BP 124/69   Pulse 62   Ht _0  (1.549 m)   Wt 200 lb (90.7 kg)   BMI 37.79 kg/m   Wt Readings from Last 3 Encounters:  05/07/19 200 lb (90.7 kg)  05/07/19 200 lb (90.7 kg)  02/01/19 198 lb (89.8 kg)    Physical Exam- Limited  Constitutional:  Body mass index is  37.79 kg/m. , not in acute distress, normal state of mind Eyes:  EOMI, no exophthalmos Neck: Supple Thyroid: No gross goiter Respiratory: Adequate breathing efforts Musculoskeletal: no gross deformities, strength intact in all four extremities, no gross restriction of joint movements Skin:  no rashes, no hyperemia Neurological: no tremor with outstretched hands,    CMP     Component Value Date/Time   NA 140 04/30/2019 0928   K 3.9 04/30/2019 0928   CL 99 04/30/2019 0928   CO2 26 04/30/2019 0928   GLUCOSE 141 (H) 04/30/2019 0928   GLUCOSE 57 (L) 05/24/2017 1111   BUN 23 04/30/2019 0928   CREATININE 2.22 (H) 04/30/2019 0928   CREATININE 2.18 (H) 05/24/2017 1111   CALCIUM 9.6 04/30/2019 0928   PROT 7.3 04/30/2019 0928   ALBUMIN 4.0 04/30/2019 0928   AST 10 04/30/2019 0928   ALT 8 04/30/2019 0928   ALKPHOS 124 (H) 04/30/2019 0928   BILITOT 0.3 04/30/2019 0928   GFRNONAA 24 (L) 04/30/2019 0928   GFRNONAA >89 10/23/2015 1008   GFRAA 28 (L) 04/30/2019 0928   GFRAA >89 10/23/2015 1008     Diabetic Labs (most recent): Lab Results  Component Value Date   HGBA1C 7.7 (H) 04/30/2019   HGBA1C 8.2 02/01/2019   HGBA1C 12.4 (A) 10/17/2018     Lipid Panel ( most recent) Lipid Panel     Component Value Date/Time   CHOL 231 (H) 04/20/2017 1418   TRIG 301 (H) 04/20/2017 1418   HDL 37 (L) 04/20/2017 1418   CHOLHDL 6.2 (H) 04/20/2017 1418   VLDL 20 10/08/2016 0628   LDLCALC 148 (H) 04/20/2017 1418      Lab Results  Component Value Date   TSH 5.150 (H) 04/30/2019   TSH 3.08 04/20/2017   TSH 5.98 (H) 01/16/2017   TSH 7.08 (H) 11/15/2016   TSH 17.574 (H) 10/07/2016   TSH 9.64 (H) 10/23/2015   TSH 7.43 (H) 05/22/2015   FREET4 1.20 04/30/2019      Assessment & Plan:   1.  Diabetes mellitus without complication ([place])   - Monica Carlson has currently controlled symptomatic type 2 DM since  55 years of age. -She presents with continued improvement in her glycemic  profile.  Her point-of-care A1c 7.7%, generally improving from 12.4%.  She did not document any hypoglycemia. -She has complications including CKD.    -She is accompanied by her care coordinator Mr. [name], I had a long discussion with her about the progressive nature of diabetes and the pathology behind its complications. -her diabetes is complicated by coronary artery disease, nephropathy , obesity/sedentary life and she remains at a high risk for more acute and chronic complications which include CAD, CVA, CKD, retinopathy, and neuropathy. These are all discussed in detail with her.  - I have counseled her on diet management and weight loss, by adopting a carbohydrate restricted/protein rich diet.   - she  admits there is a room for improvement in her diet and drink choices. -  Suggestion is made for her to avoid simple carbohydrates  from her diet including Cakes, Sweet Desserts / Pastries, Ice Cream, Soda (diet and regular), Sweet Tea, Candies, Chips, Cookies, Sweet Pastries,  Store Bought Juices, Alcohol in Excess of  1-2 drinks a day, Artificial Sweeteners, Coffee Creamer, and "Sugar-free" Products. This will help patient to have stable blood glucose profile and potentially avoid unintended weight gain.   - I encouraged her to switch to  unprocessed or minimally processed complex starch and increased protein intake (animal or plant source), fruits, and vegetables.  - she is advised to stick to a routine mealtimes to eat 3 meals  a day and avoid unnecessary snacks ( to snack only to correct hypoglycemia).   - she will be scheduled with Jearld Fenton, RDN, CDE for individualized diabetes education.  - This patient has significant cognitive deficit, and she has not been willing to start insulin treatment, and would like to avoid it if possible. -In the meantime, she continued to respond to glipizide and Januvia intervention.   -She is advised to continue glipizide 10 mg p.o. twice daily  with breakfast and supper, Januvia 50 mg p.o. daily at breakfast. -She is willing to continue to monitor blood glucose twice a day-daily before breakfast and before going to bed.  -She is encouraged to call clinic for readings below 70 or greater than 300 mg per DL. - she is not a candidate for metformin, SGLT2 inhibitors due to concurrent renal insufficiency.  - Patient specific target  A1c;  LDL, HDL, Triglycerides, and  Waist Circumference were discussed in detail.  2) Blood Pressure /Hypertension: Her blood pressure is controlled to target.   she is advised to continue her current medications including carvedilol 3.125 mg twice daily, amlodipine 5 mg p.o. daily.   3) Lipids/Hyperlipidemia:   Review of her recent lipid panel showed uncontrolled  LDL at 148 .  she  is advised to continue atorvastatin 80 mg p.o. nightly.  Side effects and precautions discussed with her.  4)  Weight/Diet: -She has a BMI of 38-   clearly complicating her diabetes care.  I discussed with her the fact that loss of 5 - 10% of her  current body weight will have the most impact on her diabetes management.  CDE Consult will be initiated . Exercise, and detailed carbohydrates information provided  -  detailed on discharge instructions.  5) hypothyroidism: Her most recent thyroid function tests are consistent with inadequate replacement.  I discussed and increase her levothyroxine to 112  mcg p.o. daily before breakfast.     - We discussed about the correct intake of her thyroid hormone, on empty stomach at fasting, with water, separated by at least 30 minutes from breakfast and other medications,  and separated by more than 4 hours from calcium, iron, multivitamins, acid reflux medications (PPIs). -Patient is made aware of the fact that thyroid hormone replacement is needed for life, dose to be adjusted by periodic monitoring of thyroid function tests.   6) Chronic Care/Health Maintenance:  -she  is on  Statin  medications and  is encouraged to initiate and continue to follow up with Ophthalmology, Dentist,  Podiatrist at least yearly or according to recommendations, and advised to   stay away from smoking. I have recommended yearly flu vaccine and pneumonia vaccine at least every 5 years; moderate intensity exercise for up to 150 minutes weekly; and  sleep for at least 7 hours a day.  - she is  advised to maintain close follow up with Wyatt Haste, NP for primary care needs, as well as her other providers for optimal and coordinated care.  - Time spent on this patient care encounter:  35 min, of which > 50% was spent in  counseling and the rest reviewing her blood glucose logs , discussing her hypoglycemia and hyperglycemia episodes, reviewing her current and  previous labs / studies  ( including abstraction from other facilities) and medications  doses and developing a  long term treatment plan and documenting her care.   Please refer to Patient Instructions for Blood Glucose Monitoring and Insulin/Medications Dosing Guide"  in media tab for additional information. Please  also refer to " Patient Self Inventory" in the Media  tab for reviewed elements of pertinent patient history.  Monica Carlson participated in the discussions, expressed understanding, and voiced agreement with the above plans.  All questions were answered to her satisfaction. she is encouraged to contact clinic should she have any questions or concerns prior to her return visit.    Follow up plan: - Return in about 3 months (around 08/07/2019) for Follow up with Pre-visit Labs, Bring Meter and Logs- A1c in Office.  Glade Lloyd, MD [facility] [address] [place], [place] [address] Phone: [phone]  Fax: [phone]    05/07/2019, 4:00 PM  This note was partially dictated with voice recognition software. Similar sounding words can be transcribed inadequately or may not   be corrected upon review.

## 2019-05-08 ENCOUNTER — Other Ambulatory Visit: Payer: Self-pay | Admitting: Endocrinology

## 2019-05-08 ENCOUNTER — Other Ambulatory Visit (HOSPITAL_COMMUNITY): Payer: Self-pay | Admitting: Endocrinology

## 2019-05-08 ENCOUNTER — Other Ambulatory Visit: Payer: Self-pay | Admitting: Nephrology

## 2019-05-08 DIAGNOSIS — N170 Acute kidney failure with tubular necrosis: Secondary | ICD-10-CM

## 2019-05-14 ENCOUNTER — Ambulatory Visit (HOSPITAL_COMMUNITY)
Admission: RE | Admit: 2019-05-14 | Discharge: 2019-05-14 | Disposition: A | Discharge status: Home / Self Care | Payer: Medicare Other | Source: Ambulatory Visit | Attending: Endocrinology | Admitting: Endocrinology

## 2019-05-14 ENCOUNTER — Other Ambulatory Visit: Payer: Self-pay

## 2019-05-14 DIAGNOSIS — N170 Acute kidney failure with tubular necrosis: Secondary | ICD-10-CM | POA: Diagnosis present

## 2019-05-16 ENCOUNTER — Encounter (HOSPITAL_COMMUNITY)
Admission: RE | Admit: 2019-05-16 | Discharge: 2019-05-16 | Disposition: A | Discharge status: Home / Self Care | Payer: Medicare Other | Source: Ambulatory Visit | Attending: Nephrology | Admitting: Nephrology

## 2019-05-16 ENCOUNTER — Other Ambulatory Visit: Payer: Self-pay

## 2019-05-16 ENCOUNTER — Encounter (HOSPITAL_COMMUNITY): Payer: Self-pay

## 2019-05-16 DIAGNOSIS — D509 Iron deficiency anemia, unspecified: Secondary | ICD-10-CM | POA: Insufficient documentation

## 2019-05-16 MED ORDER — SODIUM CHLORIDE 0.9 % IV SOLN: Freq: Once | INTRAVENOUS | Status: AC

## 2019-05-16 MED ORDER — SODIUM CHLORIDE 0.9 % IV SOLN
510.0000 mg | Freq: Once | INTRAVENOUS | Status: AC
  Administered 2019-05-16: 10:00:00 510 mg via INTRAVENOUS
  Filled 2019-05-16: qty 510

## 2019-05-23 ENCOUNTER — Encounter (HOSPITAL_COMMUNITY): Payer: Self-pay

## 2019-05-23 ENCOUNTER — Encounter (HOSPITAL_COMMUNITY)
Admission: RE | Admit: 2019-05-23 | Discharge: 2019-05-23 | Disposition: A | Discharge status: Home / Self Care | Payer: Medicare Other | Source: Ambulatory Visit | Attending: Nephrology | Admitting: Nephrology

## 2019-05-23 ENCOUNTER — Other Ambulatory Visit: Payer: Self-pay

## 2019-05-23 DIAGNOSIS — D509 Iron deficiency anemia, unspecified: Secondary | ICD-10-CM | POA: Diagnosis present

## 2019-05-23 MED ORDER — SODIUM CHLORIDE 0.9 % IV SOLN
510.0000 mg | Freq: Once | INTRAVENOUS | Status: AC
  Administered 2019-05-23: 510 mg via INTRAVENOUS
  Filled 2019-05-23: qty 17

## 2019-05-23 MED ORDER — SODIUM CHLORIDE 0.9 % IV SOLN: Freq: Once | INTRAVENOUS | Status: AC

## 2019-06-03 ENCOUNTER — Other Ambulatory Visit: Payer: Self-pay | Admitting: "Endocrinology

## 2019-06-04 ENCOUNTER — Other Ambulatory Visit (HOSPITAL_COMMUNITY): Payer: Self-pay | Admitting: General Practice

## 2019-06-04 ENCOUNTER — Other Ambulatory Visit: Payer: Self-pay | Admitting: General Practice

## 2019-06-04 DIAGNOSIS — Z1231 Encounter for screening mammogram for malignant neoplasm of breast: Secondary | ICD-10-CM

## 2019-06-04 DIAGNOSIS — M79605 Pain in left leg: Secondary | ICD-10-CM

## 2019-06-05 ENCOUNTER — Ambulatory Visit: Payer: Medicare Other | Admitting: Gastroenterology

## 2019-06-07 ENCOUNTER — Other Ambulatory Visit: Payer: Self-pay

## 2019-06-07 ENCOUNTER — Emergency Department (HOSPITAL_COMMUNITY): Payer: Medicare Other

## 2019-06-07 ENCOUNTER — Emergency Department (HOSPITAL_COMMUNITY)
Admission: EM | Admit: 2019-06-07 | Discharge: 2019-06-07 | Disposition: A | Discharge status: Home / Self Care | Payer: Medicare Other | Attending: Emergency Medicine | Admitting: Emergency Medicine

## 2019-06-07 ENCOUNTER — Encounter (HOSPITAL_COMMUNITY): Payer: Self-pay

## 2019-06-07 DIAGNOSIS — M25572 Pain in left ankle and joints of left foot: Secondary | ICD-10-CM | POA: Insufficient documentation

## 2019-06-07 DIAGNOSIS — Z7902 Long term (current) use of antithrombotics/antiplatelets: Secondary | ICD-10-CM | POA: Diagnosis not present

## 2019-06-07 DIAGNOSIS — I5043 Acute on chronic combined systolic (congestive) and diastolic (congestive) heart failure: Secondary | ICD-10-CM | POA: Insufficient documentation

## 2019-06-07 DIAGNOSIS — I252 Old myocardial infarction: Secondary | ICD-10-CM | POA: Diagnosis not present

## 2019-06-07 DIAGNOSIS — I110 Hypertensive heart disease with heart failure: Secondary | ICD-10-CM | POA: Insufficient documentation

## 2019-06-07 DIAGNOSIS — M79605 Pain in left leg: Secondary | ICD-10-CM | POA: Diagnosis present

## 2019-06-07 DIAGNOSIS — Z79899 Other long term (current) drug therapy: Secondary | ICD-10-CM | POA: Insufficient documentation

## 2019-06-07 DIAGNOSIS — Z7984 Long term (current) use of oral hypoglycemic drugs: Secondary | ICD-10-CM | POA: Insufficient documentation

## 2019-06-07 DIAGNOSIS — Z955 Presence of coronary angioplasty implant and graft: Secondary | ICD-10-CM | POA: Diagnosis not present

## 2019-06-07 DIAGNOSIS — M25562 Pain in left knee: Secondary | ICD-10-CM | POA: Insufficient documentation

## 2019-06-07 DIAGNOSIS — Z8541 Personal history of malignant neoplasm of cervix uteri: Secondary | ICD-10-CM | POA: Insufficient documentation

## 2019-06-07 DIAGNOSIS — Z8673 Personal history of transient ischemic attack (TIA), and cerebral infarction without residual deficits: Secondary | ICD-10-CM | POA: Insufficient documentation

## 2019-06-07 DIAGNOSIS — E119 Type 2 diabetes mellitus without complications: Secondary | ICD-10-CM | POA: Diagnosis not present

## 2019-06-07 MED ORDER — DICLOFENAC SODIUM 1 % EX GEL: 2.0000 g | Freq: Four times a day (QID) | CUTANEOUS | 0 refills | Status: DC | PRN

## 2019-06-07 MED ORDER — ACETAMINOPHEN 325 MG PO TABS
650.0000 mg | ORAL_TABLET | Freq: Once | ORAL | Status: AC
  Administered 2019-06-07: 650 mg via ORAL
  Filled 2019-06-07: qty 2

## 2019-06-07 NOTE — ED Notes (Signed)
Pt is diabetic, has vision difficulties   Has fallen x 2 Previous eval at [facility]  Here for eval of pain to left lower leg

## 2019-06-07 NOTE — ED Provider Notes (Signed)
[facility] Provider Note   CSN: [id number] Arrival date & time: 06/07/19  1033     History Chief Complaint  Patient presents with  . Leg Pain    Monica Carlson is a 54 y.o. female with a history of CHF, diabetes mellitus, hypertension, hypercholesterolemia, prior stroke and legal blindness in the left eye who presents to the emergency department with complaints of L knee/ankle pain over the past few days. Patient states that she fell onto her L knee when she stepped off a concrete curb about a month ago.  She was seen in urgent care same day and had reassuring x-rays.  She states she is having pain but this seemed to improve until about a few days ago when the pain returned.  States she is having intermittent pain to the knee and to the ankle of the left lower extremity.  No specific alleviating or aggravating factors.  No OTC meds today.  She did try Tylenol the other day.  She has neuropathy with her diabetes but has not had any acute change in neurologic status.  She denies fever, chills, redness, swelling, or any other areas of pain.  HPI     Past Medical History:  Diagnosis Date  . Cervical cancer ([place])    cryotherapy and colposcopy/[place], [place]; 1991  . CHF (congestive heart failure) ([place]) 07-2014  . Diabetes mellitus without complication ([place]) 9562  . High cholesterol   . Hypertension   . MI (myocardial infarction) ([place])    2004  . Stroke ([place]) 09/2014, 2013  . Vaginal Pap smear, abnormal     Patient Active Problem List   Diagnosis Date Noted  . Vitamin D deficiency 10/17/2018  . Diabetes mellitus without complication ([place]) 13/09/6576  . Encounter for screening colonoscopy 03/21/2017  . Hypertension   . High cholesterol   . Acute ischemic stroke ([place]) 10/11/2016  . Severe left ventricular systolic dysfunction   . Acute right MCA stroke ([place]) 10/10/2016  . Dysphagia   . Acute systolic congestive heart failure ([place])   . Acute on chronic combined  systolic and diastolic CHF (congestive heart failure) ([place]) 10/08/2016  . Acute on chronic diastolic CHF (congestive heart failure) ([place]) 10/07/2016  . Uncontrolled type 2 diabetes mellitus with hyperglycemia ([place]) 10/07/2016  . Essential hypertension 10/07/2016  . Personal history of noncompliance with medical treatment, presenting hazards to health 12/26/2015  . Hypothyroidism 05/30/2015  . Mixed hyperlipidemia 05/30/2015  . Uncontrolled type 2 diabetes mellitus with complication ([place]) 46/96/2952  . Coronary artery disease involving native coronary artery of native heart without angina pectoris 05/04/2015  . Essential hypertension, benign 05/04/2015  . Obesity 05/04/2015  . Stented coronary artery 05/04/2015  . History of MI (myocardial infarction) 05/04/2015  . History of CVA (cerebrovascular accident) 05/04/2015  . Hypertensive cardiomyopathy ([place]) 05/04/2015    Past Surgical History:  Procedure Laterality Date  . CHOLECYSTECTOMY  1992  . CORONARY STENT PLACEMENT  2004  . TUBAL LIGATION       OB History    Gravida  4   Para  3   Term      Preterm      AB  1   Living  3     SAB  1   TAB      Ectopic      Multiple      Live Births              Family History  Problem Relation Age of  Onset  . Migraines Mother   . Heart disease Father   . Hypertension Father   . Diabetes Father   . Migraines Maternal Grandfather   . Heart attack Maternal Grandfather   . Lung cancer Paternal Grandmother   . Colon cancer Neg Hx   . Colon polyps Neg Hx     Social History   Tobacco Use  . Smoking status: Never Smoker  . Smokeless tobacco: Never Used  Substance Use Topics  . Alcohol use: No  . Drug use: No    Home Medications Prior to Admission medications   Medication Sig Start Date End Date Taking? Authorizing Provider  amLODipine (NORVASC) 10 MG tablet TAKE 1 TABLET BY MOUTH DAILY 04/15/19   Herminio Commons, MD  aspirin 325 MG EC tablet Take 1 tablet  (325 mg total) by mouth daily. 11/15/16   Caren Macadam, MD  atorvastatin (LIPITOR) 80 MG tablet TAKE ONE TABLET BY MOUTH DAILY 07/10/17   Caren Macadam, MD  blood glucose meter kit and supplies Dispense based on patient and insurance preference. Use up to 2 times daily as directed. (FOR ICD-10 E11.65). 12/18/18   Cassandria Anger, MD  Blood Glucose Monitoring Suppl (ACCU-CHEK GUIDE ME) w/Device KIT 1 Piece by Does not apply route as directed. 07/09/18   Cassandria Anger, MD  carvedilol (COREG) 3.125 MG tablet Take 1 tablet (3.125 mg total) by mouth 2 (two) times daily with a meal. 10/25/17   Caren Macadam, MD  clopidogrel (PLAVIX) 75 MG tablet TAKE ONE TABLET BY MOUTH EVERY DAY WITH BREAKFAST 07/10/17   Caren Macadam, MD  furosemide (LASIX) 40 MG tablet TAKE ONE TABLET BY MOUTH TWICE DAILY 07/10/17   Caren Macadam, MD  gabapentin (NEURONTIN) 300 MG capsule TAKE ONE CAPSULE BY MOUTH TWICE DAILY 10/11/17   Caren Macadam, MD  glipiZIDE (GLUCOTROL) 10 MG tablet TAKE 1 TABLET BY MOUTH TWICE DAILY BEFORE a meal 06/04/19   Nida, Marella Chimes, MD  glucose blood (ACCU-CHEK GUIDE) test strip Use as instructed 4 x daily. e11.65 07/10/18   Cassandria Anger, MD  glucose blood test strip 1 each by Other route 2 (two) times daily. Use as instructed bid. E11.65 Prodigy Meter 12/13/18   Cassandria Anger, MD  glucose blood test strip 1 each by Other route 2 (two) times daily. Use as instructed bid. E11.65 12/18/18   Cassandria Anger, MD  JANUVIA 50 MG tablet TAKE 1 TABLET BY MOUTH EVERY DAY 06/04/19   Cassandria Anger, MD  Lancets Thin MISC 1 each by Does not apply route 4 (four) times daily. E11.65 Pt has Prodigy meter 10/02/18   Cassandria Anger, MD  levothyroxine (SYNTHROID) 112 MCG tablet Take 1 tablet (112 mcg total) by mouth daily. 05/07/19   Cassandria Anger, MD  nitroGLYCERIN (NITROSTAT) 0.4 MG SL tablet Place 1 tablet (0.4 mg total) under the tongue every 5 (five) minutes  as needed for chest pain. 08/15/17   Caren Macadam, MD  pantoprazole (PROTONIX) 40 MG tablet TAKE ONE TABLET BY MOUTH DAILY 07/10/17   Caren Macadam, MD  Vitamin D, Ergocalciferol, (DRISDOL) 50000 units CAPS capsule TAKE ONE CAPSULE BY MOUTH ONCE WEEKLY ON MONDAY 11/08/17   [provider]    [place] and Other  Review of Systems   Review of Systems  Constitutional: Negative for chills and fever.  Respiratory: Negative for shortness of breath.   Gastrointestinal: Negative for abdominal pain.  Musculoskeletal: Positive for arthralgias. Negative  for joint swelling.  Skin: Negative for color change and rash.  Neurological: Negative for weakness and numbness (acute).    Physical Exam Updated Vital Signs BP 119/68 (BP Location: Right Arm)   Pulse 73   Temp 98.7 F (37.1 C) (Oral)   Resp 18   Ht '5\' 1"'  (1.549 m)   Wt 90.3 kg   SpO2 96%   BMI 37.60 kg/m   Physical Exam Vitals and nursing note reviewed.  Constitutional:      General: She is not in acute distress.    Appearance: She is well-developed. She is not ill-appearing or toxic-appearing.  HENT:     Head: Normocephalic and atraumatic.  Eyes:     General:        Right eye: No discharge.        Left eye: No discharge.     Conjunctiva/sclera: Conjunctivae normal.  Cardiovascular:     Rate and Rhythm: Normal rate and regular rhythm.     Pulses:          Dorsalis pedis pulses are 2+ on the right side and 2+ on the left side.       Posterior tibial pulses are 2+ on the right side and 2+ on the left side.  Pulmonary:     Effort: Pulmonary effort is normal. No respiratory distress.     Breath sounds: Normal breath sounds. No wheezing, rhonchi or rales.  Abdominal:     General: There is no distension.     Palpations: Abdomen is soft.     Tenderness: There is no abdominal tenderness.  Musculoskeletal:     Cervical back: Neck supple.     Comments: Lower extremities: No obvious deformity, appreciable  swelling, edema, erythema, ecchymosis, or warmth. Healed appearing abrasion to the anterior L knee. Patient has intact AROM to bilateral hips, knees, ankles, and all digits. Tender to palpation to the anterior and medial joint line of the L knee, diffuse L ankle tenderness, LEs are otherwise nontender. No calf tenderness. Compartments are soft.   Skin:    General: Skin is warm and dry.     Capillary Refill: Capillary refill takes less than 2 seconds.     Findings: No rash.  Neurological:     Mental Status: She is alert.     Comments: Alert. Clear speech. Sensation grossly intact to bilateral lower extremities. 5/5 strength with plantar/dorsiflexion bilaterally. Patient ambulatory with antalgic gait.   Psychiatric:        Mood and Affect: Mood normal.        Behavior: Behavior normal.     ED Results / Procedures / Treatments   Labs (all labs ordered are listed, but only abnormal results are displayed) Labs Reviewed - No data to display  EKG None  Radiology DG Ankle Complete Left  Result Date: 06/07/2019 CLINICAL DATA:  Pain after fall. EXAM: LEFT ANKLE COMPLETE - 3+ VIEW COMPARISON:  05/19/2019 left ankle radiographs. FINDINGS: Normal alignment. No fracture. Smooth talar dome. Posterior and plantar calcaneal enthesophytes. Corticated ossific density inferior to the lateral malleolus may reflect sequela of remote trauma. The joint spaces are approximated. No soft tissue abnormalities. IMPRESSION: No acute osseous abnormality. Chronic posttraumatic deformity of the lateral malleolus. Electronically Signed   By: Primitivo Gauze M.D.   On: 06/07/2019 11:48   CLINICAL DATA: Pain, fall  EXAM: LEFT KNEE - COMPLETE 4+ VIEW  COMPARISON: None  FINDINGS: No sign of fracture or dislocation. Osteopenia.  Mild degenerative changes worse in the  medial compartment. No significant effusion.  IMPRESSION: No fracture or dislocation. Mild degenerative changes.   Electronically Signed By:  Zetta Carlson M.D. On: 06/07/2019 11:49  Procedures Procedures (including critical care time)  Medications Ordered in ED Medications - No data to display  ED Course  I have reviewed the triage vital signs and the nursing notes.  Pertinent labs & imaging results that were available during my care of the patient were reviewed by me and considered in my medical decision making (see chart for details).    MDM Rules/Calculators/A&P                      Patient presents to the ED with complaints of L knee/ankle pain over past few days, injury about 1 month prior. Nontoxic, vitals WNL. No erythema/warmth/fevers, exam not consistent with infectious process such as cellulitis, septic joint, or osteomyelitis. No edema, no calf tenderness, do not suspect DVT. Symmetric temperature/pulses- doubt arterial emboli. Xray without fracture/dislocation, mild degeneratives noted- personally reviewed & interpreted imaging. Will trial diclofenac gel, orthopedics follow up. I discussed results, treatment plan, need for follow-up, and return precautions with the patient. Provided opportunity for questions, patient confirmed understanding and is in agreement with plan.    Final Clinical Impression(s) / ED Diagnoses Final diagnoses:  Acute pain of left knee  Acute left ankle pain    Rx / DC Orders ED Discharge Orders         Ordered    diclofenac Sodium (VOLTAREN) 1 % GEL  4 times daily PRN     06/07/19 1233           Dennisse Swader, [place], PA-C 06/07/19 1239    Maudie Flakes, MD 06/08/19 1237

## 2019-06-07 NOTE — ED Notes (Signed)
Pt reports her pain at "20"  She has taken no OTC meds since yesterday  Education: She can take tylenol up to 1000mg   Four times daily not to exceed 4000mg  for pain   She also reports she has not fallen since her eval at urgent care

## 2019-06-07 NOTE — ED Notes (Signed)
To Rad 

## 2019-06-07 NOTE — Discharge Instructions (Addendum)
Please read and follow all provided instructions.  You have been seen today for left knee and ankle pain.   Tests performed today include: An x-ray of the affected areas - does NOT show any broken bones or dislocations, you do have some degenerative changes noted.  Vital signs. See below for your results today.   Medications:  Diclofenac gel- this is a nonsteroidal anti-inflammatory medication to apply to the knee and ankle topically to help with pain and swelling. You may apply this up to 4 times per day as needed. Do not take other nonsteroidal anti-inflammatory medications with this such as Advil, Motrin, Aleve, Mobic, Goodie Powder, or Motrin.    You make take Tylenol per over the counter dosing with these medications.   We have prescribed you new medication(s) today. Discuss the medications prescribed today with your pharmacist as they can have adverse effects and interactions with your other medicines including over the counter and prescribed medications. Seek medical evaluation if you start to experience new or abnormal symptoms after taking one of these medicines, seek care immediately if you start to experience difficulty breathing, feeling of your throat closing, facial swelling, or rash as these could be indications of a more serious allergic reaction  Follow-up instructions: Please follow-up with your primary care provider or the provided orthopedic physician (bone specialist) if you continue to have significant pain in 1 week. In this case you may have a more severe injury that requires further care.   Return instructions:  Please return if your digits or extremity are numb or tingling, appear gray or blue, or you have severe pain (also elevate the extremity and loosen splint or wrap if you were given one) Please return if you have redness or fevers.  Please return to the Emergency Department if you experience worsening symptoms.  Please return if you have any other emergent  concerns. Additional Information:  Your vital signs today were: BP 132/77 (BP Location: Right Arm)   Pulse 65   Temp 98.7 F (37.1 C) (Oral)   Resp 16   Ht 5\' 1"  (1.549 m)   Wt 90.3 kg   SpO2 98%   BMI 37.60 kg/m  If your blood pressure (BP) was elevated above 135/85 this visit, please have this repeated by your doctor within one month. ---------------

## 2019-06-07 NOTE — ED Notes (Signed)
Call to [place], [place]  [phone]  He is notified that pt is about to be discharged

## 2019-06-07 NOTE — ED Triage Notes (Signed)
Pt reports she is legally blind in left eye and walks with cane.  Reports fell twice stepping off of concrete curb approx 1 month ago.  Reports pain from left knee down to left foot.  Abrasion noted to left knee.  Pt says was evaluated at Urgent Care in [place] and was told nothing was broken.

## 2019-06-10 ENCOUNTER — Ambulatory Visit (HOSPITAL_COMMUNITY): Payer: Medicare Other

## 2019-06-11 ENCOUNTER — Other Ambulatory Visit: Payer: Self-pay

## 2019-06-11 ENCOUNTER — Ambulatory Visit (HOSPITAL_COMMUNITY)
Admission: RE | Admit: 2019-06-11 | Discharge: 2019-06-11 | Disposition: A | Discharge status: Home / Self Care | Payer: Medicare Other | Source: Ambulatory Visit | Attending: General Practice | Admitting: General Practice

## 2019-06-11 DIAGNOSIS — M79605 Pain in left leg: Secondary | ICD-10-CM

## 2019-06-20 ENCOUNTER — Encounter: Payer: Self-pay | Admitting: Orthopaedic Surgery

## 2019-06-20 ENCOUNTER — Ambulatory Visit (INDEPENDENT_AMBULATORY_CARE_PROVIDER_SITE_OTHER): Payer: Medicare Other | Admitting: Orthopaedic Surgery

## 2019-06-20 ENCOUNTER — Other Ambulatory Visit: Payer: Self-pay

## 2019-06-20 ENCOUNTER — Ambulatory Visit (HOSPITAL_COMMUNITY): Admission: RE | Admit: 2019-06-20 | Payer: Medicare Other | Source: Ambulatory Visit

## 2019-06-20 VITALS — BP 158/92 | HR 79 | Temp 97.3°F | Ht 61.0 in | Wt 194.2 lb

## 2019-06-20 DIAGNOSIS — M25572 Pain in left ankle and joints of left foot: Secondary | ICD-10-CM | POA: Diagnosis not present

## 2019-06-20 DIAGNOSIS — G8929 Other chronic pain: Secondary | ICD-10-CM

## 2019-06-20 NOTE — Progress Notes (Signed)
Subjective:    Patient ID: Monica Carlson, female    DOB: [birth year], 54 y.o.   MRN: [id number]  HPI She fell and hurt her left knee and left ankle about six weeks ago.  She took misstep off concrete curb.  They have been hurting some since then, more of the ankle.  The left knee is much improved.  She went to the ER on 06-07-2019. X-rays were done.  The ankle x-rays showed: IMPRESSION: No acute osseous abnormality.  Chronic posttraumatic deformity of the lateral malleolus.   I have reviewed the ER records.  I have independently reviewed and interpreted x-rays of this patient done at another site by another physician or qualified health professional.  She had old injury to the left ankle years ago.  She has no swelling of the ankle, no redness.  Pain is more dorsally.  She uses a cane.  She has no numbness.  The left knee is not painful today. She has some crepitus and slight swelling.   Review of Systems  Constitutional: Positive for activity change.  Musculoskeletal: Positive for arthralgias, gait problem and joint swelling.  All other systems reviewed and are negative.  For Review of Systems, all other systems reviewed and are negative.  The following is a summary of the past history medically, past history surgically, known current medicines, social history and family history.  This information is gathered electronically by the computer from prior information and documentation.  I review this each visit and have found including this information at this point in the chart is beneficial and informative.   Past Medical History:  Diagnosis Date  . Cervical cancer ([place])    cryotherapy and colposcopy/[place], [place]; 1991  . CHF (congestive heart failure) ([place]) 07-2014  . Diabetes mellitus without complication ([place]) 4580  . High cholesterol   . Hypertension   . MI (myocardial infarction) ([place])    2004  . Stroke ([place]) 09/2014, 2013  . Vaginal Pap smear, abnormal     Past  Surgical History:  Procedure Laterality Date  . CHOLECYSTECTOMY  1992  . CORONARY STENT PLACEMENT  2004  . TUBAL LIGATION      Current Outpatient Medications on File Prior to Visit  Medication Sig Dispense Refill  . amLODipine (NORVASC) 10 MG tablet TAKE 1 TABLET BY MOUTH DAILY 90 tablet 1  . aspirin 325 MG EC tablet Take 1 tablet (325 mg total) by mouth daily.    Marland Kitchen atorvastatin (LIPITOR) 80 MG tablet TAKE ONE TABLET BY MOUTH DAILY 90 tablet 1  . blood glucose meter kit and supplies Dispense based on patient and insurance preference. Use up to 2 times daily as directed. (FOR ICD-10 E11.65). 1 each 5  . Blood Glucose Monitoring Suppl (ACCU-CHEK GUIDE ME) w/Device KIT 1 Piece by Does not apply route as directed. 1 kit 0  . carvedilol (COREG) 3.125 MG tablet Take 1 tablet (3.125 mg total) by mouth 2 (two) times daily with a meal. 62 tablet 1  . clopidogrel (PLAVIX) 75 MG tablet TAKE ONE TABLET BY MOUTH EVERY DAY WITH BREAKFAST 90 tablet 1  . diclofenac Sodium (VOLTAREN) 1 % GEL Apply 2 g topically 4 (four) times daily as needed (pain/swelling). 2 g 0  . furosemide (LASIX) 40 MG tablet TAKE ONE TABLET BY MOUTH TWICE DAILY 180 tablet 1  . gabapentin (NEURONTIN) 300 MG capsule TAKE ONE CAPSULE BY MOUTH TWICE DAILY 62 capsule 2  . glipiZIDE (GLUCOTROL) 10 MG tablet TAKE 1 TABLET BY MOUTH  TWICE DAILY BEFORE a meal 180 tablet 0  . glucose blood (ACCU-CHEK GUIDE) test strip Use as instructed 4 x daily. e11.65 150 each 2  . glucose blood test strip 1 each by Other route 2 (two) times daily. Use as instructed bid. E11.65 Prodigy Meter 100 each 3  . glucose blood test strip 1 each by Other route 2 (two) times daily. Use as instructed bid. E11.65 200 each 2  . JANUVIA 50 MG tablet TAKE 1 TABLET BY MOUTH EVERY DAY 90 tablet 0  . Lancets Thin MISC 1 each by Does not apply route 4 (four) times daily. E11.65 Pt has Prodigy meter 150 each 2  . levothyroxine (SYNTHROID) 112 MCG tablet Take 1 tablet (112 mcg  total) by mouth daily. 90 tablet 1  . nitroGLYCERIN (NITROSTAT) 0.4 MG SL tablet Place 1 tablet (0.4 mg total) under the tongue every 5 (five) minutes as needed for chest pain. 30 tablet 2  . pantoprazole (PROTONIX) 40 MG tablet TAKE ONE TABLET BY MOUTH DAILY 90 tablet 1  . Vitamin D, Ergocalciferol, (DRISDOL) 50000 units CAPS capsule TAKE ONE CAPSULE BY MOUTH ONCE WEEKLY ON MONDAY  0   Current Facility-Administered Medications on File Prior to Visit  Medication Dose Route Frequency Provider Last Rate Last Admin  . glucose blood test strip STRP 1 each  1 each Other BID Dorris Fetch Marella Chimes, MD        Social History   Socioeconomic History  . Marital status: Legally Separated    Spouse name: Not on file  . Number of children: Not on file  . Years of education: Not on file  . Highest education level: Not on file  Occupational History  . Not on file  Tobacco Use  . Smoking status: Never Smoker  . Smokeless tobacco: Never Used  Substance and Sexual Activity  . Alcohol use: No  . Drug use: No  . Sexual activity: Yes    Birth control/protection: None, Surgical    Comment: tubal  Other Topics Concern  . Not on file  Social History Narrative   Lives alone.    Has some friends.    Neighbors.    Social Determinants of Health   Financial Resource Strain:   . Difficulty of Paying Living Expenses:   Food Insecurity:   . Worried About Charity fundraiser in the Last Year:   . Arboriculturist in the Last Year:   Transportation Needs:   . Film/video editor (Medical):   Marland Kitchen Lack of Transportation (Non-Medical):   Physical Activity:   . Days of Exercise per Week:   . Minutes of Exercise per Session:   Stress:   . Feeling of Stress :   Social Connections:   . Frequency of Communication with Friends and Family:   . Frequency of Social Gatherings with Friends and Family:   . Attends Religious Services:   . Active Member of Clubs or Organizations:   . Attends Theatre manager Meetings:   Marland Kitchen Marital Status:   Intimate Partner Violence:   . Fear of Current or Ex-Partner:   . Emotionally Abused:   Marland Kitchen Physically Abused:   . Sexually Abused:     Family History  Problem Relation Age of Onset  . Migraines Mother   . Heart disease Father   . Hypertension Father   . Diabetes Father   . Migraines Maternal Grandfather   . Heart attack Maternal Grandfather   . Lung cancer Paternal  Grandmother   . Colon cancer Neg Hx   . Colon polyps Neg Hx     BP (!) 158/92   Pulse 79   Temp (!) 97.3 F (36.3 C)   Ht 5' 1" (1.549 m)   Wt 194 lb 4 oz (88.1 kg)   BMI 36.70 kg/m   Body mass index is 36.7 kg/m.      Objective:   Physical Exam Vitals and nursing note reviewed.  Constitutional:      Appearance: She is well-developed.  HENT:     Head: Normocephalic and atraumatic.  Eyes:     Conjunctiva/sclera: Conjunctivae normal.     Pupils: Pupils are equal, round, and reactive to light.  Cardiovascular:     Rate and Rhythm: Normal rate and regular rhythm.  Pulmonary:     Effort: Pulmonary effort is normal.  Abdominal:     Palpations: Abdomen is soft.  Musculoskeletal:     Cervical back: Normal range of motion and neck supple.       Legs:  Skin:    General: Skin is warm and dry.  Neurological:     Mental Status: She is alert and oriented to person, place, and time.     Cranial Nerves: No cranial nerve deficit.     Motor: No abnormal muscle tone.     Coordination: Coordination normal.     Deep Tendon Reflexes: Reflexes are normal and symmetric. Reflexes normal.  Psychiatric:        Behavior: Behavior normal.        Thought Content: Thought content normal.        Judgment: Judgment normal.           Assessment & Plan:   Encounter Diagnosis  Name Primary?  . Chronic pain of left ankle Yes   I have given CAM walker to use.  I have recommended contrast baths and given sheet of instructions.  Return in two weeks.  She has a form  of Voltaren Gel and I asked her to use that.  Call if any problem.  Precautions discussed.   Electronically Signed Sanjuana Kava, MD 4/29/202111:12 AM

## 2019-06-24 NOTE — Progress Notes (Addendum)
Cardiology Office Note  Date: 06/25/2019   ID: Monica Carlson, DOB Aug 28, 1965, MRN [id number]  PCP:  Monica Lords, NP  Cardiologist:  Monica Sable, MD Electrophysiologist:  None   Chief Complaint: Follow-up CAD, cardiomyopathy, chronic combined systolic and diastolic heart failure, HTN, HLD, bilateral CVAs, Type 2 DM  History of Present Illness: Monica Carlson is a 54 y.o. female with a history of CAD (history of bare-metal stent RCA 2004), cardiomyopathy (improved EF via echocardiogram April 2019 with EF of 40 to 45%) (previously 20%), chronic combined systolic and diastolic heart failure (Entresto stopped by nephrology), HTN, HLD, DM, bilateral CVAs.  Last seen by Dr. Bronson Carlson on 09/17/2018 for follow-up secondary to cardiomyopathy and CAD.  She denied any chest pain, palpitations, shortness of breath.  She complained of bilateral feet neuropathy was taking gabapentin.  She was seeing endocrinology for her diabetes.  She had been taken off Metformin.  Patient states she has been doing well from a cardiac standpoint.  She denies any recent acute illnesses, hospitalizations.  She denies any progressive anginal or exertional symptoms, palpitations or arrhythmias, orthostatic symptoms, dyspeptic symptoms, or blood in stool or urine.  Denies any CVA or TIA like symptoms.  Denies any claudication-like symptoms, DVT or PE-like symptoms, or lower extremity edema.  She has an orthopedic boot on her left leg from a fall with an ankle sprain.  States she has had some anemia recently with some iron infusions.  She will soon undergo colonoscopy with GI in [place] on 09/04/2019 to determine possible source of anemia. Patient states she has been told she snores a lot and a friend recently stayed with her told her she was having periods of apnea.  Patient denies any daytime hypersomnolence.  States she is not sure what her recent hemoglobin A1c has been.  She follows with Dr. Dorris Carlson endocrinology in  [place].  She has significant visual problems likely secondary to diabetic retinopathy.  She has stage IV renal disease with a recent creatinine of 2.48 and GFR 21.  She sees Dr. Theador Carlson nephrology.   Past Medical History:  Diagnosis Date  . Cervical cancer ([place])    cryotherapy and colposcopy/[place]; 1991  . CHF (congestive heart failure) ([place]) 07-2014  . Diabetes mellitus without complication ([place]) 6468  . High cholesterol   . Hypertension   . MI (myocardial infarction) ([place])    2004  . Stroke ([place]) 09/2014, 2013  . Vaginal Pap smear, abnormal     Past Surgical History:  Procedure Laterality Date  . CHOLECYSTECTOMY  1992  . CORONARY STENT PLACEMENT  2004  . TUBAL LIGATION      Current Outpatient Medications  Medication Sig Dispense Refill  . amLODipine (NORVASC) 10 MG tablet TAKE 1 TABLET BY MOUTH DAILY 90 tablet 1  . aspirin 325 MG EC tablet Take 1 tablet (325 mg total) by mouth daily.    Marland Kitchen atorvastatin (LIPITOR) 80 MG tablet TAKE ONE TABLET BY MOUTH DAILY 90 tablet 1  . blood glucose meter kit and supplies Dispense based on patient and insurance preference. Use up to 2 times daily as directed. (FOR ICD-10 E11.65). 1 each 5  . Blood Glucose Monitoring Suppl (ACCU-CHEK GUIDE ME) w/Device KIT 1 Piece by Does not apply route as directed. 1 kit 0  . carvedilol (COREG) 3.125 MG tablet Take 1 tablet (3.125 mg total) by mouth 2 (two) times daily with a meal. 62 tablet 1  . CLENPIQ 10-3.5-12 MG-GM -GM/160ML SOLN Take 1 kit  by mouth once for 1 dose. 320 mL 0  . clopidogrel (PLAVIX) 75 MG tablet TAKE ONE TABLET BY MOUTH EVERY DAY WITH BREAKFAST 90 tablet 1  . diclofenac Sodium (VOLTAREN) 1 % GEL Apply 2 g topically 4 (four) times daily as needed (pain/swelling). 2 g 0  . furosemide (LASIX) 40 MG tablet TAKE ONE TABLET BY MOUTH TWICE DAILY 180 tablet 1  . gabapentin (NEURONTIN) 300 MG capsule TAKE ONE CAPSULE BY MOUTH TWICE DAILY 62 capsule 2  . glipiZIDE (GLUCOTROL) 10 MG  tablet TAKE 1 TABLET BY MOUTH TWICE DAILY BEFORE a meal 180 tablet 0  . glucose blood (ACCU-CHEK GUIDE) test strip Use as instructed 4 x daily. e11.65 150 each 2  . glucose blood test strip 1 each by Other route 2 (two) times daily. Use as instructed bid. E11.65 Prodigy Meter 100 each 3  . glucose blood test strip 1 each by Other route 2 (two) times daily. Use as instructed bid. E11.65 200 each 2  . JANUVIA 50 MG tablet TAKE 1 TABLET BY MOUTH EVERY DAY 90 tablet 0  . Lancets Thin MISC 1 each by Does not apply route 4 (four) times daily. E11.65 Pt has Prodigy meter 150 each 2  . levothyroxine (SYNTHROID) 112 MCG tablet Take 1 tablet (112 mcg total) by mouth daily. 90 tablet 1  . nitroGLYCERIN (NITROSTAT) 0.4 MG SL tablet Place 1 tablet (0.4 mg total) under the tongue every 5 (five) minutes as needed for chest pain. 30 tablet 2  . pantoprazole (PROTONIX) 40 MG tablet TAKE ONE TABLET BY MOUTH DAILY 90 tablet 1  . torsemide (DEMADEX) 20 MG tablet Take 20 mg by mouth daily.    . Vitamin D, Ergocalciferol, (DRISDOL) 50000 units CAPS capsule TAKE ONE CAPSULE BY MOUTH ONCE WEEKLY ON MONDAY  0   Current Facility-Administered Medications  Medication Dose Route Frequency Provider Last Rate Last Admin  . glucose blood test strip STRP 1 each  1 each Other BID Monica Carlson, Monica Chimes, MD       Allergies:  [place] and Other   Social History: The patient  reports that she has never smoked. She has never used smokeless tobacco. She reports that she does not drink alcohol or use drugs.   Family History: The patient's family history includes Diabetes in her father; Heart attack in her maternal grandfather; Heart disease in her father; Hypertension in her father; Lung cancer in her paternal grandmother; Migraines in her maternal grandfather and mother.   ROS:  Please see the history of present illness. Otherwise, complete review of systems is positive for none.  All other systems are reviewed and negative.    Physical Exam: VS:  BP 140/80   Pulse 76   Ht 5' 1" (1.549 m)   Wt 195 lb (88.5 kg)   SpO2 97%   BMI 36.84 kg/m , BMI Body mass index is 36.84 kg/m.  Wt Readings from Last 3 Encounters:  06/25/19 195 lb (88.5 kg)  06/25/19 195 lb (88.5 kg)  06/20/19 194 lb 4 oz (88.1 kg)    General: Patient appears comfortable at rest. Lungs: Clear to auscultation, nonlabored breathing at rest. Cardiac: Regular rate and rhythm, no S3 or significant systolic murmur, no pericardial rub. Extremities: No pitting edema, distal pulses 2+. Skin: Warm and dry. Musculoskeletal: No kyphosis. Neuropsychiatric: Alert and oriented x3, affect grossly appropriate.  ECG:  EKG September 17, 2018 showed normal sinus rhythm rate of 68, nonspecific T wave abnormality, prolonged QT.  QT/QTc  450/478 ms.  Recent Labwork: 04/30/2019: ALT 8; AST 10; BUN 23; Creatinine, Ser 2.22; Potassium 3.9; Sodium 140; TSH 5.150     Component Value Date/Time   CHOL 231 (H) 04/20/2017 1418   TRIG 301 (H) 04/20/2017 1418   HDL 37 (L) 04/20/2017 1418   CHOLHDL 6.2 (H) 04/20/2017 1418   VLDL 20 10/08/2016 0628   LDLCALC 148 (H) 04/20/2017 1418    Other Studies Reviewed Today:  Nuclear Myovue Stress 04/08/2017  There was no ST segment deviation noted during stress.  Defect 1: There is a large defect of moderate severity present in the apical anterior and apex location. There is significant reverse redistribution artifact.  Findings consistent with prior myocardial infarction.  This is an intermediate risk study.  Nuclear stress EF: 39%.   Echo 06/02/2017 Left ventricle: The cavity size was normal. Wall thickness was  increased in a pattern of mild LVH. Systolic function was mildly  to moderately reduced. The estimated ejection fraction was in the  range of 40% to 45%. Abnormal global longitudinal strain of  -14.4%. Diffuse hypokinesis. There is severe hypokinesis of the  basalinferior myocardium. Doppler  parameters are consistent with  abnormal left ventricular relaxation (grade 1 diastolic  dysfunction).  - Aortic valve: Mildly calcified annulus. Trileaflet. Moderate  calcification involving the noncoronary cusp.  - Mitral valve: Mildly calcified leaflets . There was mild  regurgitation.  - Right atrium: Central venous pressure (est): 3 mm Hg.  - Atrial septum: No defect or patent foramen ovale was identified.  - Tricuspid valve: There was trivial regurgitation.  - Pulmonary arteries: PA peak pressure: 20 mm Hg (S).  - Pericardium, extracardiac: There was no pericardial effusion.   Impressions:   - There has been improvement in LVEF compared to prior study in  August 2018.   Assessment and Plan:  1. CAD in native artery   2. Cardiomyopathy, unspecified type ([place])   3. History of CVA (cerebrovascular accident)   4. Essential hypertension, benign   5. Mixed hyperlipidemia   6. Chronic combined systolic and diastolic heart failure ([place])    1. CAD in native artery Denies any recent anginal or exertional symptoms.  She is status post BMS to RCA 2004.  Continue Plavix 75 mg.  DC full dose aspirin, start aspirin 81 mg daily.  Continue nitroglycerin sublingual as needed.  Continue Coreg 3.125 mg p.o. twice daily.  2. Cardiomyopathy, unspecified type ([place]) Cardiomyopathy was improved on last echocardiogram from 20% up to 40 to 45% on echo in April 2019. May need to schedule a repeat study at follow-up in 6 months.  3. History of CVA (cerebrovascular accident) No significant focal neurologic deficits from previous CVAs.  No new CVA or TIA-like symptoms.  4. Essential hypertension, benign Blood pressure today was 140/80.  Continue amlodipine 10 mg daily.  She has 2 diuretics on her medication list.  One is Lasix 40 mg twice daily and the other is torsemide 20 mg daily.  We will call patient's pharmacy and determine which medication she is taking.  Patient has vision issues and  concern is she may be taking both inadvertently. She gets her medication pre-packaged and cannot read the medication labels. She has stage IV renal disease and sees nephrology in [place].  5. Mixed hyperlipidemia Last lipid panel noted was 2 years ago April 20, 2017.  Total cholesterol 231, HDL 37, triglycerides 301, LDL 148.  She is on high intensity statin therapy taking atorvastatin 80 mg daily.  May benefit from PCSK9 inhibitor.  6. Chronic combined systolic and diastolic heart failure ([place]) Patient denies any significant weight gain, lower extremity edema, orthopnea, PND or fatigue.  Her EF had significantly increased to 40-45% on echo on 2019 versus a previous EF of 20.  She is asymptomatic.  Medication Adjustments/Labs and Tests Ordered: Current medicines are reviewed at length with the patient today.  Concerns regarding medicines are outlined above.   Disposition: Follow-up with Dr Monica Carlson or APP 6 months.  Signed, Levell July, NP 06/25/2019 12:16 PM    [facility] [place]. [place], [place], [place] [address] Phone: [phone]; Fax: [phone]

## 2019-06-25 ENCOUNTER — Encounter: Payer: Self-pay | Admitting: Gastroenterology

## 2019-06-25 ENCOUNTER — Encounter: Payer: Self-pay | Admitting: *Deleted

## 2019-06-25 ENCOUNTER — Other Ambulatory Visit: Payer: Self-pay

## 2019-06-25 ENCOUNTER — Ambulatory Visit (INDEPENDENT_AMBULATORY_CARE_PROVIDER_SITE_OTHER): Payer: Medicare Other | Admitting: Gastroenterology

## 2019-06-25 ENCOUNTER — Ambulatory Visit (INDEPENDENT_AMBULATORY_CARE_PROVIDER_SITE_OTHER): Payer: Medicare Other | Admitting: Family Medicine

## 2019-06-25 ENCOUNTER — Encounter: Payer: Self-pay | Admitting: Family Medicine

## 2019-06-25 VITALS — BP 140/80 | HR 76 | Ht 61.0 in | Wt 195.0 lb

## 2019-06-25 DIAGNOSIS — Z8673 Personal history of transient ischemic attack (TIA), and cerebral infarction without residual deficits: Secondary | ICD-10-CM | POA: Diagnosis not present

## 2019-06-25 DIAGNOSIS — E782 Mixed hyperlipidemia: Secondary | ICD-10-CM

## 2019-06-25 DIAGNOSIS — I5042 Chronic combined systolic (congestive) and diastolic (congestive) heart failure: Secondary | ICD-10-CM

## 2019-06-25 DIAGNOSIS — I2510 Atherosclerotic heart disease of native coronary artery without angina pectoris: Secondary | ICD-10-CM | POA: Diagnosis not present

## 2019-06-25 DIAGNOSIS — I10 Essential (primary) hypertension: Secondary | ICD-10-CM | POA: Diagnosis not present

## 2019-06-25 DIAGNOSIS — I429 Cardiomyopathy, unspecified: Secondary | ICD-10-CM

## 2019-06-25 DIAGNOSIS — D509 Iron deficiency anemia, unspecified: Secondary | ICD-10-CM

## 2019-06-25 MED ORDER — ASPIRIN EC 81 MG PO TBEC: 81.0000 mg | DELAYED_RELEASE_TABLET | Freq: Every day | ORAL | 2 refills | Status: DC

## 2019-06-25 MED ORDER — CLENPIQ 10-3.5-12 MG-GM -GM/160ML PO SOLN: 1.0000 | Freq: Once | ORAL | 0 refills | Status: AC

## 2019-06-25 NOTE — Progress Notes (Signed)
Primary Care Physician:  Dara Lords, NP  Primary Gastroenterologist:  Garfield Cornea, MD   Chief Complaint  Patient presents with  . Colonoscopy    consult, never had tcs    HPI:  Monica Carlson is a 54 y.o. female with history of remote stroke, remote MI s/p stent, CHF, diabetes, chronic kidney disease stage 4 presenting to consider first-ever colonoscopy.  She was scheduled for colonoscopy back in May 2019 but the procedure was canceled as patient declined.  Patient presents with her social worker, Reyne Dumas.  He reports that Dr. Theador Hawthorne her nephrologist recommended that she present for colonoscopy because her iron has been low.Two iron infusions in 04/2019.  Hemoglobin has been stable in the setting of anemia of chronic disease.  BM daily. No melena, brbpr. No abdominal pain. Good appetite. No n/v, dysphagia, heartburn.  Last hospitalization for CHF 2-3 years ago.  Remote MI status post stent in 2004.  Appt with cardiology today.    Current Outpatient Medications  Medication Sig Dispense Refill  . amLODipine (NORVASC) 10 MG tablet TAKE 1 TABLET BY MOUTH DAILY 90 tablet 1  . aspirin 325 MG EC tablet Take 1 tablet (325 mg total) by mouth daily.    Marland Kitchen atorvastatin (LIPITOR) 80 MG tablet TAKE ONE TABLET BY MOUTH DAILY 90 tablet 1  . blood glucose meter kit and supplies Dispense based on patient and insurance preference. Use up to 2 times daily as directed. (FOR ICD-10 E11.65). 1 each 5  . Blood Glucose Monitoring Suppl (ACCU-CHEK GUIDE ME) w/Device KIT 1 Piece by Does not apply route as directed. 1 kit 0  . carvedilol (COREG) 3.125 MG tablet Take 1 tablet (3.125 mg total) by mouth 2 (two) times daily with a meal. 62 tablet 1  . clopidogrel (PLAVIX) 75 MG tablet TAKE ONE TABLET BY MOUTH EVERY DAY WITH BREAKFAST 90 tablet 1  . diclofenac Sodium (VOLTAREN) 1 % GEL Apply 2 g topically 4 (four) times daily as needed (pain/swelling). 2 g 0  . furosemide (LASIX) 40 MG tablet TAKE ONE TABLET BY  MOUTH TWICE DAILY 180 tablet 1  . gabapentin (NEURONTIN) 300 MG capsule TAKE ONE CAPSULE BY MOUTH TWICE DAILY 62 capsule 2  . glipiZIDE (GLUCOTROL) 10 MG tablet TAKE 1 TABLET BY MOUTH TWICE DAILY BEFORE a meal 180 tablet 0  . glucose blood (ACCU-CHEK GUIDE) test strip Use as instructed 4 x daily. e11.65 150 each 2  . glucose blood test strip 1 each by Other route 2 (two) times daily. Use as instructed bid. E11.65 Prodigy Meter 100 each 3  . glucose blood test strip 1 each by Other route 2 (two) times daily. Use as instructed bid. E11.65 200 each 2  . JANUVIA 50 MG tablet TAKE 1 TABLET BY MOUTH EVERY DAY 90 tablet 0  . Lancets Thin MISC 1 each by Does not apply route 4 (four) times daily. E11.65 Pt has Prodigy meter 150 each 2  . levothyroxine (SYNTHROID) 112 MCG tablet Take 1 tablet (112 mcg total) by mouth daily. 90 tablet 1  . nitroGLYCERIN (NITROSTAT) 0.4 MG SL tablet Place 1 tablet (0.4 mg total) under the tongue every 5 (five) minutes as needed for chest pain. 30 tablet 2  . pantoprazole (PROTONIX) 40 MG tablet TAKE ONE TABLET BY MOUTH DAILY 90 tablet 1  . Vitamin D, Ergocalciferol, (DRISDOL) 50000 units CAPS capsule TAKE ONE CAPSULE BY MOUTH ONCE WEEKLY ON MONDAY  0   Current Facility-Administered Medications  Medication Dose Route  Frequency Provider Last Rate Last Admin  . glucose blood test strip STRP 1 each  1 each Other BID Cassandria Anger, MD        Allergies as of 06/25/2019 - Review Complete 06/25/2019  Allergen Reaction Noted  . [place]  05/24/2017  . Other  05/24/2017    Past Medical History:  Diagnosis Date  . Cervical cancer ([place])    cryotherapy and [place]pos[place]; 1991  . CHF (congestive heart failure) ([place]) 07-2014  . Diabetes mellitus without complication ([place]) 7680  . High cholesterol   . Hypertension   . MI (myocardial infarction) ([place])    2004  . Stroke ([place]) 09/2014, 2013  . Vaginal Pap smear, abnormal     Past Surgical History:   Procedure Laterality Date  . CHOLECYSTECTOMY  1992  . CORONARY STENT PLACEMENT  2004  . TUBAL LIGATION      Family History  Problem Relation Age of Onset  . Migraines Mother   . Heart disease Father   . Hypertension Father   . Diabetes Father   . Migraines Maternal Grandfather   . Heart attack Maternal Grandfather   . Lung cancer Paternal Grandmother   . Colon cancer Neg Hx   . Colon polyps Neg Hx     Social History   Socioeconomic History  . Marital status: Legally Separated    Spouse name: Not on file  . Number of children: Not on file  . Years of education: Not on file  . Highest education level: Not on file  Occupational History  . Not on file  Tobacco Use  . Smoking status: Never Smoker  . Smokeless tobacco: Never Used  Substance and Sexual Activity  . Alcohol use: No  . Drug use: No  . Sexual activity: Yes    Birth control/protection: None, Surgical    Comment: tubal  Other Topics Concern  . Not on file  Social History Narrative   Lives alone.    Has some friends.    Neighbors.    Social Determinants of Health   Financial Resource Strain:   . Difficulty of Paying Living Expenses:   Food Insecurity:   . Worried About Charity fundraiser in the Last Year:   . Arboriculturist in the Last Year:   Transportation Needs:   . Film/video editor (Medical):   Marland Kitchen Lack of Transportation (Non-Medical):   Physical Activity:   . Days of Exercise per Week:   . Minutes of Exercise per Session:   Stress:   . Feeling of Stress :   Social Connections:   . Frequency of Communication with Friends and Family:   . Frequency of Social Gatherings with Friends and Family:   . Attends Religious Services:   . Active Member of Clubs or Organizations:   . Attends Archivist Meetings:   Marland Kitchen Marital Status:   Intimate Partner Violence:   . Fear of Current or Ex-Partner:   . Emotionally Abused:   Marland Kitchen Physically Abused:   . Sexually Abused:        ROS:  General: Negative for anorexia, weight loss, fever, chills, fatigue, weakness. Eyes: Negative for vision changes.  Blindness in 1 eye ENT: Negative for hoarseness, difficulty swallowing , nasal congestion. CV: Negative for chest pain, angina, palpitations, dyspnea on exertion, peripheral edema.  Respiratory: Negative for dyspnea at rest, dyspnea on exertion, cough, sputum, wheezing.  GI: See history of present illness. GU:  Negative for dysuria, hematuria,  urinary incontinence, urinary frequency, nocturnal urination.  MS: Negative for  low back pain.  Foot pain Derm: Negative for rash or itching.  Neuro: Negative for weakness, abnormal sensation, seizure, frequent headaches, memory loss, confusion.  Psych: Negative for anxiety, depression, suicidal ideation, hallucinations.  Endo: Negative for unusual weight change.  Heme: Negative for bruising or bleeding. Allergy: Negative for rash or hives.    Physical Examination:  BP 135/87   Pulse 78   Temp (!) 97.1 F (36.2 C) (Temporal)   Ht 5' 1" (1.549 m)   Wt 195 lb (88.5 kg)   BMI 36.84 kg/m    General: No acute distress, appears older than stated age.  Social worker present. Head: Normocephalic, atraumatic.   Eyes: Conjunctiva pink, no icterus. Mouth:masked. Neck: Supple without thyromegaly, masses, or lymphadenopathy.  Lungs: Clear to auscultation bilaterally.  Heart: Regular rate and rhythm, no murmurs rubs or gallops.  Abdomen: Bowel sounds are normal, nontender, nondistended, no hepatosplenomegaly or masses, no abdominal bruits or    hernia , no rebound or guarding.  Limited exam as patient felt she cannot get up on the exam table due to her foot. Rectal: defer Extremities: No lower extremity edema on the right. No clubbing or deformities on the right lower extremity.  Boot on the left lower extremity.  Neuro: Alert and oriented x 4 , grossly normal neurologically.  Skin: Warm and dry, no rash or jaundice.   Psych:  Alert and cooperative, normal mood and affect.  Labs:   Lab Results  Component Value Date   ALT 8 04/30/2019   AST 10 04/30/2019   ALKPHOS 124 (H) 04/30/2019   BILITOT 0.3 04/30/2019   Lab Results  Component Value Date   CREATININE 2.22 (H) 04/30/2019   BUN 23 04/30/2019   NA 140 04/30/2019   K 3.9 04/30/2019   CL 99 04/30/2019   CO2 26 04/30/2019     Lab Results  Component Value Date   IRON 102 05/24/2017   TIBC 329 05/24/2017   FERRITIN 251 (H) 05/24/2017   Lab Results  Component Value Date   VITAMINB12 451 10/11/2016   No results found for: FOLATE  Labs from April 30, 2019: Hemoglobin 10.9 (normal 11.1-15.9), hematocrit 31.8, MCV 92, platelets 319,000, iron sats 14%, TIBC 305, ferritin 142.  Imaging Studies: DG Ankle Complete Left  Result Date: 06/07/2019 CLINICAL DATA:  Pain after fall. EXAM: LEFT ANKLE COMPLETE - 3+ VIEW COMPARISON:  05/19/2019 left ankle radiographs. FINDINGS: Normal alignment. No fracture. Smooth talar dome. Posterior and plantar calcaneal enthesophytes. Corticated ossific density inferior to the lateral malleolus may reflect sequela of remote trauma. The joint spaces are approximated. No soft tissue abnormalities. IMPRESSION: No acute osseous abnormality. Chronic posttraumatic deformity of the lateral malleolus. Electronically Signed   By: Primitivo Gauze M.D.   On: 06/07/2019 11:48   US ARTERIAL ABI (SCREENING LOWER EXTREMITY)  Result Date: 06/11/2019 CLINICAL DATA:  54 year old female with pain in the left leg after a fall EXAM: NONINVASIVE PHYSIOLOGIC VASCULAR STUDY OF BILATERAL LOWER EXTREMITIES TECHNIQUE: Evaluation of both lower extremities was performed at rest, including calculation of ankle-brachial indices, multiple segmental pressure evaluation, segmental Doppler and segmental pulse volume recording. COMPARISON:  None. FINDINGS: Right ABI:  0.83 Left ABI:  0.80 Right Lower Extremity: Segmental Doppler demonstrates monophasic posterior  tibial artery and triphasic dorsalis pedis Left Lower Extremity: Segmental Doppler demonstrates triphasic dorsalis pedis and monophasic posterior tibial artery IMPRESSION: Resting ABI the bilateral lower extremities in the mild range  arterial occlusive disease. Segmental exam demonstrates posterior tibial arterial occlusive disease of the bilateral lower extremities. Signed, Dulcy Fanny. Dellia Nims, RPVI [facility] Radiology Electronically Signed   By: Corrie Mckusick D.O.   On: 06/11/2019 14:29   DG Knee Complete 4 Views Left  Result Date: 06/07/2019 CLINICAL DATA:  Pain, fall EXAM: LEFT KNEE - COMPLETE 4+ VIEW COMPARISON:  None FINDINGS: No sign of fracture or dislocation. Osteopenia. Mild degenerative changes worse in the medial compartment. No significant effusion. IMPRESSION: No fracture or dislocation. Mild degenerative changes. Electronically Signed   By: Zetta Bills M.D.   On: 06/07/2019 11:49

## 2019-06-25 NOTE — Assessment & Plan Note (Signed)
Pleasant 54 year old female with multiple comorbidities as outlined above however overall stable, presenting for further evaluation of iron deficiency.  She is followed by nephrology for stage 4 chronic kidney disease.  Her hemoglobin has been stable and she is felt to have anemia of chronic disease.  She recently was sent for iron infusions x2 back in March.  Hemoccult status unknown.  Denies any GI symptoms.  No prior colonoscopy.  No family history of colon cancer.  Recommend colonoscopy in the near future for iron deficiency, screening purposes.  I have discussed the risks, alternatives, benefits with regards to but not limited to the risk of reaction to medication, bleeding, infection, perforation and the patient is agreeable to proceed. Written consent to be obtained.  Gust at length with patient and her social worker regarding bowel prep.  We will plan to provide the entire bowel prep the day before the procedure as she will have more help available and would be easier for her.  We have also provided enlarged prep instructions due to her poor vision.

## 2019-06-25 NOTE — Patient Instructions (Signed)
1. Colonoscopy as scheduled. See separate instructions.  

## 2019-06-25 NOTE — Patient Instructions (Addendum)
Medication Instructions:    Your physician has recommended you make the following change in your medication:   Decrease aspirin to 81 mg by mouth daily  Continue other medications the same  Labwork:  NONE  Testing/Procedures:  NONE  Follow-Up:  Your physician recommends that you schedule a follow-up appointment in: 6 months (office). You will receive a reminder letter in the mail in about 4 months reminding you to call and schedule your appointment. If you don't receive this letter, please contact our office.  Any Other Special Instructions Will Be Listed Below (If Applicable).  If you need a refill on your cardiac medications before your next appointment, please call your pharmacy.

## 2019-07-02 ENCOUNTER — Ambulatory Visit (INDEPENDENT_AMBULATORY_CARE_PROVIDER_SITE_OTHER): Payer: Medicare Other | Admitting: Orthopaedic Surgery

## 2019-07-02 ENCOUNTER — Encounter: Payer: Self-pay | Admitting: Orthopaedic Surgery

## 2019-07-02 ENCOUNTER — Other Ambulatory Visit: Payer: Self-pay

## 2019-07-02 VITALS — BP 182/106 | HR 80 | Temp 98.1°F | Ht 61.0 in | Wt 195.4 lb

## 2019-07-02 DIAGNOSIS — G8929 Other chronic pain: Secondary | ICD-10-CM | POA: Diagnosis not present

## 2019-07-02 DIAGNOSIS — M25572 Pain in left ankle and joints of left foot: Secondary | ICD-10-CM | POA: Diagnosis not present

## 2019-07-02 NOTE — Progress Notes (Signed)
Patient Monica Carlson, female DOB:1965/05/23, 54 y.o. BMW:[id number]  Chief Complaint  Patient presents with  . Ankle Pain    l/doing better as long as not wearing the boot     HPI  Monica Carlson is a 54 y.o. female who has pain of the left ankle.  She could not tolerate the CAM walker and has returned it.  She has improved and has less pain of the left ankle.  She has some swelling but much less.     Body mass index is 36.92 kg/m.  ROS  Review of Systems  Constitutional: Positive for activity change.  Musculoskeletal: Positive for arthralgias, gait problem and joint swelling.  All other systems reviewed and are negative.   All other systems reviewed and are negative.  The following is a summary of the past history medically, past history surgically, known current medicines, social history and family history.  This information is gathered electronically by the computer from prior information and documentation.  I review this each visit and have found including this information at this point in the chart is beneficial and informative.    Past Medical History:  Diagnosis Date  . Cervical cancer ([place])    cryotherapy and colposcopy/[place], [place]; 1991  . CHF (congestive heart failure) ([place]) 07-2014  . Diabetes mellitus without complication ([place]) 2725  . High cholesterol   . Hypertension   . MI (myocardial infarction) ([place])    2004  . Stroke ([place]) 09/2014, 2013  . Vaginal Pap smear, abnormal     Past Surgical History:  Procedure Laterality Date  . CHOLECYSTECTOMY  1992  . CORONARY STENT PLACEMENT  2004  . TUBAL LIGATION      Family History  Problem Relation Age of Onset  . Migraines Mother   . Heart disease Father   . Hypertension Father   . Diabetes Father   . Migraines Maternal Grandfather   . Heart attack Maternal Grandfather   . Lung cancer Paternal Grandmother   . Colon cancer Neg Hx   . Colon polyps Neg Hx     Social History Social History   Tobacco  Use  . Smoking status: Never Smoker  . Smokeless tobacco: Never Used  Substance Use Topics  . Alcohol use: No  . Drug use: No    Allergies  Allergen Reactions  . Cedar     Swelling   . Other     Teriyaki- swelling    Current Outpatient Medications  Medication Sig Dispense Refill  . amLODipine (NORVASC) 10 MG tablet TAKE 1 TABLET BY MOUTH DAILY (Patient not taking: Reported on 06/25/2019) 90 tablet 1  . aspirin EC 81 MG tablet Take 1 tablet (81 mg total) by mouth daily. 90 tablet 2  . atorvastatin (LIPITOR) 80 MG tablet TAKE ONE TABLET BY MOUTH DAILY 90 tablet 1  . blood glucose meter kit and supplies Dispense based on patient and insurance preference. Use up to 2 times daily as directed. (FOR ICD-10 E11.65). 1 each 5  . Blood Glucose Monitoring Suppl (ACCU-CHEK GUIDE ME) w/Device KIT 1 Piece by Does not apply route as directed. 1 kit 0  . carvedilol (COREG) 3.125 MG tablet Take 1 tablet (3.125 mg total) by mouth 2 (two) times daily with a meal. (Patient not taking: Reported on 06/25/2019) 62 tablet 1  . clopidogrel (PLAVIX) 75 MG tablet TAKE ONE TABLET BY MOUTH EVERY DAY WITH BREAKFAST (Patient not taking: Reported on 06/25/2019) 90 tablet 1  . diclofenac Sodium (VOLTAREN) 1 %  GEL Apply 2 g topically 4 (four) times daily as needed (pain/swelling). 2 g 0  . gabapentin (NEURONTIN) 300 MG capsule TAKE ONE CAPSULE BY MOUTH TWICE DAILY (Patient not taking: Reported on 06/25/2019) 62 capsule 2  . glipiZIDE (GLUCOTROL) 10 MG tablet TAKE 1 TABLET BY MOUTH TWICE DAILY BEFORE a meal (Patient not taking: Reported on 06/25/2019) 180 tablet 0  . glucose blood (ACCU-CHEK GUIDE) test strip Use as instructed 4 x daily. e11.65 150 each 2  . glucose blood test strip 1 each by Other route 2 (two) times daily. Use as instructed bid. E11.65 Prodigy Meter 100 each 3  . glucose blood test strip 1 each by Other route 2 (two) times daily. Use as instructed bid. E11.65 200 each 2  . JANUVIA 50 MG tablet TAKE 1 TABLET BY  MOUTH EVERY DAY (Patient not taking: Reported on 06/25/2019) 90 tablet 0  . Lancets Thin MISC 1 each by Does not apply route 4 (four) times daily. E11.65 Pt has Prodigy meter 150 each 2  . levothyroxine (SYNTHROID) 112 MCG tablet Take 1 tablet (112 mcg total) by mouth daily. (Patient not taking: Reported on 06/25/2019) 90 tablet 1  . nitroGLYCERIN (NITROSTAT) 0.4 MG SL tablet Place 1 tablet (0.4 mg total) under the tongue every 5 (five) minutes as needed for chest pain. 30 tablet 2  . pantoprazole (PROTONIX) 40 MG tablet TAKE ONE TABLET BY MOUTH DAILY (Patient not taking: Reported on 06/25/2019) 90 tablet 1  . torsemide (DEMADEX) 20 MG tablet Take 20 mg by mouth daily.    . Vitamin D, Ergocalciferol, (DRISDOL) 50000 units CAPS capsule TAKE ONE CAPSULE BY MOUTH ONCE WEEKLY ON MONDAY  0   Current Facility-Administered Medications  Medication Dose Route Frequency Provider Last Rate Last Admin  . glucose blood test strip STRP 1 each  1 each Other BID Dorris Fetch, Marella Chimes, MD         Physical Exam  Blood pressure (!) 182/106, pulse 80, temperature 98.1 F (36.7 C), height 5' 1" (1.549 m), weight 195 lb 6 oz (88.6 kg).  Constitutional: overall normal hygiene, normal nutrition, well developed, normal grooming, normal body habitus. Assistive device:cane  Musculoskeletal: gait and station Limp left, muscle tone and strength are normal, no tremors or atrophy is present.  .  Neurological: coordination overall normal.  Deep tendon reflex/nerve stretch intact.  Sensation normal.  Cranial nerves II-XII intact.   Skin:   Normal overall no scars, lesions, ulcers or rashes. No psoriasis.  Psychiatric: Alert and oriented x 3.  Recent memory intact, remote memory unclear.  Normal mood and affect. Well groomed.  Good eye contact.  Cardiovascular: overall no swelling, no varicosities, no edema bilaterally, normal temperatures of the legs and arms, no clubbing, cyanosis and good capillary refill.  Lymphatic:  palpation is normal.  All other systems reviewed and are negative   Left ankle is tender, has slight lateral swelling, some medial pain, ROM is good.  The patient has been educated about the nature of the problem(s) and counseled on treatment options.  The patient appeared to understand what I have discussed and is in agreement with it.  Encounter Diagnosis  Name Primary?  . Chronic pain of left ankle Yes    PLAN Call if any problems.  Precautions discussed.  Continue current medications.   Return to clinic 2 weeks   Electronically Signed Sanjuana Kava, MD 5/11/20212:43 PM

## 2019-08-06 ENCOUNTER — Encounter: Payer: Medicare Other | Admitting: Vascular Surgery

## 2019-08-08 ENCOUNTER — Other Ambulatory Visit: Payer: Self-pay

## 2019-08-08 ENCOUNTER — Ambulatory Visit: Payer: Medicare Other | Admitting: "Endocrinology

## 2019-08-08 ENCOUNTER — Ambulatory Visit: Payer: Medicare Other | Admitting: Nutrition

## 2019-08-08 DIAGNOSIS — E1122 Type 2 diabetes mellitus with diabetic chronic kidney disease: Secondary | ICD-10-CM

## 2019-09-02 ENCOUNTER — Telehealth: Payer: Self-pay | Admitting: Internal Medicine

## 2019-09-02 ENCOUNTER — Other Ambulatory Visit (HOSPITAL_COMMUNITY)
Admission: RE | Admit: 2019-09-02 | Discharge: 2019-09-02 | Disposition: A | Discharge status: Home / Self Care | Payer: Medicare Other | Source: Ambulatory Visit | Attending: Internal Medicine | Admitting: Internal Medicine

## 2019-09-02 ENCOUNTER — Other Ambulatory Visit: Payer: Self-pay | Admitting: "Endocrinology

## 2019-09-02 NOTE — Telephone Encounter (Signed)
LMOVM for Sherlean Foot

## 2019-09-02 NOTE — Telephone Encounter (Signed)
Monica Carlson with [facility] called to cancel patient procedure with [facility]MR on Wednesday. He said the patient has another procedure that's more pressing and this procedure could wait.

## 2019-09-02 NOTE — Telephone Encounter (Signed)
noted 

## 2019-09-02 NOTE — Progress Notes (Addendum)
Patient is having Cataract Surgery the day she was supposed to have her Colonoscopy.(Wednesday) Her DDS said her eye surgery was more pressing than a diaganoistic procedure. Patient is going to reschedule her procedure once her eye heals. Nothing further needed.

## 2019-09-02 NOTE — Telephone Encounter (Signed)
Recommend rescheduling as soon as she can due to this being a diagnostic procedure for IDA.

## 2019-09-02 NOTE — Telephone Encounter (Signed)
Monica Carlson called back. Patient having cataract surgery Wednesday and she so she did not want to have TCS done yet. Once patient recovers states they will call back to r/s. FYI to LSL

## 2019-09-04 ENCOUNTER — Ambulatory Visit (HOSPITAL_COMMUNITY): Admit: 2019-09-04 | Payer: Medicare Other | Admitting: Internal Medicine

## 2019-09-04 ENCOUNTER — Encounter (HOSPITAL_COMMUNITY): Payer: Self-pay

## 2019-09-04 SURGERY — COLONOSCOPY
Anesthesia: Moderate Sedation

## 2019-09-30 ENCOUNTER — Other Ambulatory Visit: Payer: Self-pay | Admitting: Cardiology

## 2019-09-30 ENCOUNTER — Other Ambulatory Visit: Payer: Self-pay | Admitting: "Endocrinology

## 2019-10-10 ENCOUNTER — Other Ambulatory Visit: Payer: Self-pay | Admitting: "Endocrinology

## 2019-10-23 ENCOUNTER — Telehealth: Payer: Self-pay | Admitting: Family Medicine

## 2019-10-23 NOTE — Telephone Encounter (Signed)
Thank You and I agree with your assessment.

## 2019-10-23 NOTE — Telephone Encounter (Signed)
Pt called concerned about her BP- 112/66 pulse was 68   [phone]

## 2019-10-23 NOTE — Telephone Encounter (Signed)
Says she is concerned that her BP is too low and HR is too high. Denies dizziness, chest pain, or sob. Reports headaches. Advised that BP and HR are both in range and that she needed to contact her PCP about her headaches. Patient asked if she is okay to have cataract surgery tomorrow. Advised that her surgeon would need to contact us directly if there were any questions or concerns regarding surgery. Verbalized understanding.

## 2019-11-11 ENCOUNTER — Other Ambulatory Visit: Payer: Self-pay | Admitting: "Endocrinology

## 2019-11-15 LAB — TSH: TSH: 21.11 — AB (ref 0.41–5.90)

## 2019-11-19 ENCOUNTER — Other Ambulatory Visit: Payer: Self-pay

## 2019-11-19 ENCOUNTER — Ambulatory Visit (INDEPENDENT_AMBULATORY_CARE_PROVIDER_SITE_OTHER): Payer: Medicare Other | Admitting: Nurse Practitioner

## 2019-11-19 ENCOUNTER — Encounter: Payer: Self-pay | Admitting: Nurse Practitioner

## 2019-11-19 VITALS — BP 153/85 | HR 73 | Ht 61.0 in | Wt 194.0 lb

## 2019-11-19 DIAGNOSIS — E1121 Type 2 diabetes mellitus with diabetic nephropathy: Secondary | ICD-10-CM

## 2019-11-19 DIAGNOSIS — E038 Other specified hypothyroidism: Secondary | ICD-10-CM | POA: Diagnosis not present

## 2019-11-19 DIAGNOSIS — E1122 Type 2 diabetes mellitus with diabetic chronic kidney disease: Secondary | ICD-10-CM

## 2019-11-19 DIAGNOSIS — E063 Autoimmune thyroiditis: Secondary | ICD-10-CM

## 2019-11-19 DIAGNOSIS — E782 Mixed hyperlipidemia: Secondary | ICD-10-CM

## 2019-11-19 DIAGNOSIS — E559 Vitamin D deficiency, unspecified: Secondary | ICD-10-CM | POA: Diagnosis not present

## 2019-11-19 DIAGNOSIS — N1831 Chronic kidney disease, stage 3a: Secondary | ICD-10-CM | POA: Diagnosis not present

## 2019-11-19 DIAGNOSIS — I10 Essential (primary) hypertension: Secondary | ICD-10-CM

## 2019-11-19 LAB — POCT GLYCOSYLATED HEMOGLOBIN (HGB A1C): Hemoglobin A1C: 7.7 % — AB (ref 4.0–5.6)

## 2019-11-19 NOTE — Patient Instructions (Signed)
Advice for Weight Management  -For most of us the best way to lose weight is by diet management. Generally speaking, diet management means consuming less calories intentionally which over time brings about progressive weight loss.  This can be achieved more effectively by restricting carbohydrate consumption to the minimum possible.  So, it is critically important to know your numbers: how much calorie you are consuming and how much calorie you need. More importantly, our carbohydrates sources should be unprocessed or minimally processed complex starch food items.   Sometimes, it is important to balance nutrition by increasing protein intake (animal or plant source), fruits, and vegetables.  -Sticking to a routine mealtime to eat 3 meals a day and avoiding unnecessary snacks is shown to have a big role in weight control. Under normal circumstances, the only time we lose real weight is when we are hungry, so allow hunger to take place- hunger means no food between meal times, only water.  It is not advisable to starve.   -It is better to avoid simple carbohydrates including: Cakes, Sweet Desserts, Ice Cream, Soda (diet and regular), Sweet Tea, Candies, Chips, Cookies, Store Bought Juices, Alcohol in Excess of  1-2 drinks a day, Artificial Sweeteners, Doughnuts, Coffee Creamers, "Sugar-free" Products, etc, etc.  This is not a complete list.....    -Consulting with certified diabetes educators is proven to provide you with the most accurate and current information on diet.  Also, you may be  interested in discussing diet options/exchanges , we can schedule a visit with Penny Crumpton, RDN, CDE for individualized nutrition education.  -Exercise: If you are able: 30 -60 minutes a day ,4 days a week, or 150 minutes a week.  The longer the better.  Combine stretch, strength, and aerobic activities.  If you were told in the past that you have high risk for cardiovascular diseases, you may seek evaluation by  your heart doctor prior to initiating moderate to intense exercise programs.      - The correct intake of thyroid hormone (Levothyroxine, Synthroid), is on empty stomach first thing in the morning, with water, separated by at least 30 minutes from breakfast and other medications,  and separated by more than 4 hours from calcium, iron, multivitamins, acid reflux medications (PPIs).  - This medication is a life-long medication and will be needed to correct thyroid hormone imbalances for the rest of your life.  The dose may change from time to time, based on thyroid blood work.  - It is extremely important to be consistent taking this medication, near the same time each morning.  -AVOID TAKING PRODUCTS CONTAINING BIOTIN (commonly found in Hair, Skin, Nails vitamins) AS IT INTERFERES WITH THE VALIDITY OF THYROID FUNCTION BLOOD TESTS.  

## 2019-11-19 NOTE — Progress Notes (Signed)
11/19/2019, 3:44 PM        Endocrinology follow-up note                                       She is accompanied by her care coordinator  Mr. Nolon Nations. All issues noted in this document were discussed and addressed.   Subjective:    Patient ID: Monica Carlson, female    DOB: 07-04-1965.  Monica Carlson is being seen for follow-up in  management of currently uncontrolled symptomatic diabetes requested by  Dekoninck, Dahlia Bailiff, NP.   Past Medical History:  Diagnosis Date   Cervical cancer ([place])    cryotherapy and colposcopy/[place]; 1991   CHF (congestive heart failure) ([facility]) 07-2014   Diabetes mellitus without complication ([place]) 8403   High cholesterol    Hypertension    MI (myocardial infarction) ([place])    2004   Stroke ([place]) 09/2014, 2013   Vaginal Pap smear, abnormal     Past Surgical History:  Procedure Laterality Date   CHOLECYSTECTOMY  1992   CORONARY STENT PLACEMENT  2004   TUBAL LIGATION      Social History   Socioeconomic History   Marital status: Legally Separated    Spouse name: Not on file   Number of children: Not on file   Years of education: Not on file   Highest education level: Not on file  Occupational History   Not on file  Tobacco Use   Smoking status: Never Smoker   Smokeless tobacco: Never Used  Vaping Use   Vaping Use: Never used  Substance and Sexual Activity   Alcohol use: No   Drug use: No   Sexual activity: Yes    Birth control/protection: None, Surgical    Comment: tubal  Other Topics Concern   Not on file  Social History Narrative   Lives alone.    Has some friends.    Neighbors.    Social Determinants of Health   Financial Resource Strain:    Difficulty of Paying Living Expenses: Not on file  Food Insecurity:    Worried About Charity fundraiser in the Last Year: Not on file   [employer] of Food in the Last Year: Not on file   Transportation Needs:    Lack of Transportation (Medical): Not on file   Lack of Transportation (Non-Medical): Not on file  Physical Activity:    Days of Exercise per Week: Not on file   Minutes of Exercise per Session: Not on file  Stress:    Feeling of Stress : Not on file  Social Connections:    Frequency of Communication with Friends and Family: Not on file   Frequency of Social Gatherings with Friends and Family: Not on file   Attends Religious Services: Not on file   Active Member of Clubs or Organizations: Not on file   Attends Archivist Meetings: Not on file   Marital Status: Not on file    Family History  Problem Relation Age of Onset   Migraines Mother    Heart disease Father  Hypertension Father    Diabetes Father    Migraines Maternal Grandfather    Heart attack Maternal Grandfather    Lung cancer Paternal Grandmother    Colon cancer Neg Hx    Colon polyps Neg Hx     Outpatient Encounter Medications as of 11/19/2019  Medication Sig   amLODipine (NORVASC) 10 MG tablet TAKE 1 TABLET BY MOUTH DAILY   aspirin EC 81 MG tablet Take 1 tablet (81 mg total) by mouth daily.   atorvastatin (LIPITOR) 80 MG tablet TAKE ONE TABLET BY MOUTH DAILY   blood glucose meter kit and supplies Dispense based on patient and insurance preference. Use up to 2 times daily as directed. (FOR ICD-10 E11.65).   Blood Glucose Monitoring Suppl (ACCU-CHEK GUIDE ME) w/Device KIT 1 Piece by Does not apply route as directed.   carvedilol (COREG) 3.125 MG tablet Take 1 tablet (3.125 mg total) by mouth 2 (two) times daily with a meal.   clopidogrel (PLAVIX) 75 MG tablet TAKE ONE TABLET BY MOUTH EVERY DAY WITH BREAKFAST   diclofenac Sodium (VOLTAREN) 1 % GEL Apply 2 g topically 4 (four) times daily as needed (pain/swelling).   gabapentin (NEURONTIN) 300 MG capsule TAKE ONE CAPSULE BY MOUTH TWICE DAILY   glipiZIDE (GLUCOTROL) 10 MG tablet TAKE 1 TABLET BY MOUTH  TWICE DAILY BEFORE a meal   glucose blood (ACCU-CHEK GUIDE) test strip Use as instructed 4 x daily. e11.65   glucose blood test strip 1 each by Other route 2 (two) times daily. Use as instructed bid. E11.65 Prodigy Meter   glucose blood test strip 1 each by Other route 2 (two) times daily. Use as instructed bid. E11.65   JANUVIA 50 MG tablet TAKE 1 TABLET BY MOUTH EVERY DAY   Lancets Thin MISC 1 each by Does not apply route 4 (four) times daily. E11.65 Pt has Prodigy meter   levothyroxine (SYNTHROID) 112 MCG tablet Take 1 tablet (112 mcg total) by mouth daily.   nitroGLYCERIN (NITROSTAT) 0.4 MG SL tablet Place 1 tablet (0.4 mg total) under the tongue every 5 (five) minutes as needed for chest pain.   pantoprazole (PROTONIX) 40 MG tablet TAKE ONE TABLET BY MOUTH DAILY   torsemide (DEMADEX) 20 MG tablet Take 20 mg by mouth daily.   Vitamin D, Ergocalciferol, (DRISDOL) 50000 units CAPS capsule TAKE ONE CAPSULE BY MOUTH ONCE WEEKLY ON MONDAY   Facility-Administered Encounter Medications as of 11/19/2019  Medication   glucose blood test strip STRP 1 each    ALLERGIES: Allergies  Allergen Reactions   [place]     Swelling    Other     Teriyaki- swelling    VACCINATION STATUS: Immunization History  Administered Date(s) Administered   Tdap 04/20/2017    Diabetes She presents for her follow-up diabetic visit. She has type 2 diabetes mellitus. Onset time: She was diagnosed at approximate age of 10 years. Her disease course has been improving. Pertinent negatives for hypoglycemia include no confusion, headaches, nervousness/anxiousness, pallor, seizures, sweats or tremors. Associated symptoms include polyuria. Pertinent negatives for diabetes include no chest pain, no polydipsia and no polyphagia. Pertinent negatives for hypoglycemia complications include no required assistance. Symptoms are improving. Diabetic complications include a CVA, heart disease and nephropathy. Risk factors  for coronary artery disease include diabetes mellitus, dyslipidemia, family history, hypertension, sedentary lifestyle, post-menopausal and obesity. Current diabetic treatment includes oral agent (dual therapy). She is compliant with treatment most of the time. Her weight is stable. She is following a generally  unhealthy diet. When asked about meal planning, she reported none. She has not had a previous visit with a dietitian. She never participates in exercise. Her home blood glucose trend is fluctuating minimally. Her breakfast blood glucose range is generally 140-180 mg/dl. Her bedtime blood glucose range is generally >200 mg/dl. (She presents today, accompanied by her caregiver, with logs showing slightly above target fasting and post prandial glycemic profile.  Her POCT A1C today is 7.7%, unchanged from last visit.  She denies any episodes of hypoglycemia.) An ACE inhibitor/angiotensin II receptor blocker is not being taken. She does not see a podiatrist.Eye exam is current.  Hyperlipidemia This is a chronic problem. The current episode started more than 1 year ago. The problem is uncontrolled. Exacerbating diseases include chronic renal disease, diabetes, hypothyroidism and obesity. Factors aggravating her hyperlipidemia include beta blockers and fatty foods. Pertinent negatives include no chest pain, myalgias or shortness of breath. Current antihyperlipidemic treatment includes statins. The current treatment provides mild improvement of lipids. Compliance problems include adherence to diet and adherence to exercise.  Risk factors for coronary artery disease include dyslipidemia, diabetes mellitus, hypertension, obesity, family history, a sedentary lifestyle and post-menopausal.  Hypertension This is a chronic problem. The current episode started more than 1 year ago. The problem has been gradually improving since onset. The problem is uncontrolled. Pertinent negatives include no chest pain, headaches,  palpitations, shortness of breath or sweats. Agents associated with hypertension include thyroid hormones. Risk factors for coronary artery disease include dyslipidemia, diabetes mellitus, obesity, post-menopausal state and sedentary lifestyle. Past treatments include calcium channel blockers and beta blockers. The current treatment provides mild improvement. Compliance problems include diet and exercise.  Hypertensive end-organ damage includes kidney disease, CAD/MI, CVA and heart failure. Identifiable causes of hypertension include chronic renal disease.   Review of systems  Constitutional: + Minimally fluctuating body weight,  current Body mass index is 36.66 kg/m. , no fatigue, no subjective hyperthermia, no subjective hypothermia Eyes: legally blind, no xerophthalmia ENT: no sore throat, no nodules palpated in throat, no dysphagia/odynophagia, no hoarseness Cardiovascular: no chest pain, no shortness of breath, no palpitations, no leg swelling Respiratory: no cough, no shortness of breath Gastrointestinal: no nausea/vomiting/diarrhea Musculoskeletal: no muscle/joint aches Skin: no rashes, no hyperemia Neurological: no tremors, no numbness, no tingling, no dizziness Psychiatric: no depression, no anxiety  Objective:    BP (!) 153/85 (BP Location: Left Arm, Patient Position: Sitting)    Pulse 73    Ht '5\' 1"'  (1.549 m)    Wt 194 lb (88 kg)    BMI 36.66 kg/m   Wt Readings from Last 3 Encounters:  11/19/19 194 lb (88 kg)  07/02/19 195 lb 6 oz (88.6 kg)  06/25/19 195 lb (88.5 kg)    BP Readings from Last 3 Encounters:  11/19/19 (!) 153/85  07/02/19 (!) 182/106  06/25/19 140/80    Physical Exam- Limited  Constitutional:  Body mass index is 36.66 kg/m. , not in acute distress, normal state of mind Eyes:  EOMI, no exophthalmos Neck: Supple Thyroid: No gross goiter Cardiovascular: RRR, no murmers, rubs, or gallops, no edema Respiratory: Adequate breathing efforts, no crackles, rales,  rhonchi, or wheezing Musculoskeletal: no gross deformities, strength intact in all four extremities, no gross restriction of joint movements Skin:  no rashes, no hyperemia Neurological: no tremor with outstretched hands   CMP     Component Value Date/Time   NA 140 04/30/2019 0928   K 3.9 04/30/2019 0928   CL 99 04/30/2019  0928   CO2 26 04/30/2019 0928   GLUCOSE 141 (H) 04/30/2019 0928   GLUCOSE 57 (L) 05/24/2017 1111   BUN 23 04/30/2019 0928   CREATININE 2.22 (H) 04/30/2019 0928   CREATININE 2.18 (H) 05/24/2017 1111   CALCIUM 9.6 04/30/2019 0928   PROT 7.3 04/30/2019 0928   ALBUMIN 4.0 04/30/2019 0928   AST 10 04/30/2019 0928   ALT 8 04/30/2019 0928   ALKPHOS 124 (H) 04/30/2019 0928   BILITOT 0.3 04/30/2019 0928   GFRNONAA 24 (L) 04/30/2019 0928   GFRNONAA >89 10/23/2015 1008   GFRAA 28 (L) 04/30/2019 0928   GFRAA >89 10/23/2015 1008     Diabetic Labs (most recent): Lab Results  Component Value Date   HGBA1C 7.7 (A) 11/19/2019   HGBA1C 7.7 (H) 04/30/2019   HGBA1C 8.2 02/01/2019     Lipid Panel ( most recent) Lipid Panel     Component Value Date/Time   CHOL 231 (H) 04/20/2017 1418   TRIG 301 (H) 04/20/2017 1418   HDL 37 (L) 04/20/2017 1418   CHOLHDL 6.2 (H) 04/20/2017 1418   VLDL 20 10/08/2016 0628   LDLCALC 148 (H) 04/20/2017 1418      Lab Results  Component Value Date   TSH 21.11 (A) 11/15/2019   TSH 5.150 (H) 04/30/2019   TSH 3.08 04/20/2017   TSH 5.98 (H) 01/16/2017   TSH 7.08 (H) 11/15/2016   TSH 17.574 (H) 10/07/2016   TSH 9.64 (H) 10/23/2015   TSH 7.43 (H) 05/22/2015   FREET4 1.20 04/30/2019      Assessment & Plan:   1. Diabetes mellitus without complication (HCC)   - Monica Carlson has currently controlled symptomatic type 2 DM since  54 years of age.  She presents today, accompanied by her caregiver, with logs showing slightly above target fasting and post prandial glycemic profile.  Her POCT A1C today is 7.7%, unchanged from last visit.   She denies any episodes of hypoglycemia.  -She has complications including CKD, legally blind.    -She is accompanied by her care coordinator, Mr. [name], I had a long discussion with her about the progressive nature of diabetes and the pathology behind its complications. -her diabetes is complicated by coronary artery disease, nephropathy , obesity/sedentary life and she remains at a high risk for more acute and chronic complications which include CAD, CVA, CKD, retinopathy, and neuropathy. These are all discussed in detail with her.  - I have counseled her on diet management and weight loss, by adopting a carbohydrate restricted/protein rich diet.  - The patient admits there is a room for improvement in their diet and drink choices. -  Suggestion is made for the patient to avoid simple carbohydrates from their diet including Cakes, Sweet Desserts / Pastries, Ice Cream, Soda (diet and regular), Sweet Tea, Candies, Chips, Cookies, Sweet Pastries,  Store Bought Juices, Alcohol in Excess of  1-2 drinks a day, Artificial Sweeteners, Coffee Creamer, and "Sugar-free" Products. This will help patient to have stable blood glucose profile and potentially avoid unintended weight gain.   - I encouraged the patient to switch to  unprocessed or minimally processed complex starch and increased protein intake (animal or plant source), fruits, and vegetables.   - Patient is advised to stick to a routine mealtimes to eat 3 meals  a day and avoid unnecessary snacks ( to snack only to correct hypoglycemia).  - she will be scheduled with Jearld Fenton, RDN, CDE for individualized diabetes education.  - This patient has  significant cognitive deficit, and she has not been willing to start insulin treatment, and would like to avoid it if possible.  Given her stable glycemic profile, will keep off insulin for now. She is advised to continue Januvia 50 mg po daily and Glipizide 10 mg po twice daily with meals.  -She  is encouraged to continue monitoring blood glucose at least twice daily, before breakfast and before bed and call the clinic if readings are less than 70 or greater than 300 for 3 tests in a row.   - she is not a candidate for metformin, SGLT2 inhibitors due to concurrent renal insufficiency.  - Patient specific target  A1c;  LDL, HDL, Triglycerides, and  Waist Circumference were discussed in detail.  2) Blood Pressure /Hypertension: Her blood pressure is not controlled to target.  She is advised to continue Amlodipine 10 mg po daily and Carvedilol 3.125 mg po twice daily.  She sees her nephrologist tomorrow.  Will defer to them for med changes.  3) Lipids/Hyperlipidemia:    Her most recent lipid panel from 04/20/17 shows uncontrolled LDL of 148 and triglycerides of 301.  She is advised to continue Atorvastatin 80 mg po daily at bedtime.  Side effects and precautions discussed with her.  Will recheck lipid panel prior to next visit in 4 months.  4)  Weight/Diet: - Her Body mass index is 36.66 kg/m.-   clearly complicating her diabetes care.  I discussed with her the fact that loss of 5 - 10% of her  current body weight will have the most impact on her diabetes management.  CDE Consult will be initiated . Exercise, and detailed carbohydrates information provided  -  detailed on discharge instructions.  5) Hypothyroidism: Her most recent thyroid function tests are consistent with inadequate replacement. She is not consistent with taking her medication separately from other medications (she uses bubble packs).  She is advised to continue Levothyroxine 112 mcg po daily before breakfast.  Will recheck thyroid function tests prior to next visit.   - We discussed about the correct intake of her thyroid hormone, on empty stomach at fasting, with water, separated by at least 30 minutes from breakfast and other medications,  and separated by more than 4 hours from calcium, iron, multivitamins, acid reflux  medications (PPIs). -Patient is made aware of the fact that thyroid hormone replacement is needed for life, dose to be adjusted by periodic monitoring of thyroid function tests.  6) Chronic Care/Health Maintenance: -she  is on  Statin medications and  is encouraged to initiate and continue to follow up with Ophthalmology, Dentist,  Podiatrist at least yearly or according to recommendations, and advised to   stay away from smoking. I have recommended yearly flu vaccine and pneumonia vaccine at least every 5 years; moderate intensity exercise for up to 150 minutes weekly; and  sleep for at least 7 hours a day.  - she is  advised to maintain close follow up with Dekoninck, Eustaquio Maize A, NP for primary care needs, as well as her other providers for optimal and coordinated care.   - Time spent on this patient care encounter:  35 min, of which > 50% was spent in  counseling and the rest reviewing her blood glucose logs , discussing her hypoglycemia and hyperglycemia episodes, reviewing her current and  previous labs / studies  ( including abstraction from other facilities) and medications  doses and developing a  long term treatment plan and documenting her care.  Please refer to Patient Instructions for Blood Glucose Monitoring and Insulin/Medications Dosing Guide"  in media tab for additional information. Please  also refer to " Patient Self Inventory" in the Media  tab for reviewed elements of pertinent patient history.  Monica Carlson participated in the discussions, expressed understanding, and voiced agreement with the above plans.  All questions were answered to her satisfaction. she is encouraged to contact clinic should she have any questions or concerns prior to her return visit.   Follow up plan: - Return in about 4 months (around 03/20/2020) for Diabetes follow up, Thyroid follow up, Previsit labs.  Rayetta Pigg, [facility] [address] [place], [place]  [address] Phone: [phone] Fax: [phone]  11/19/2019, 3:44 PM  This note was partially dictated with voice recognition software. Similar sounding words can be transcribed inadequately or may not  be corrected upon review.

## 2019-11-20 ENCOUNTER — Other Ambulatory Visit: Payer: Self-pay | Admitting: "Endocrinology

## 2019-11-20 ENCOUNTER — Other Ambulatory Visit: Payer: Self-pay

## 2019-12-01 ENCOUNTER — Other Ambulatory Visit: Payer: Self-pay | Admitting: "Endocrinology

## 2019-12-09 NOTE — Progress Notes (Signed)
Can you call her case worker [name] and make sure he was able to get her thyroid medication separated from her other meds?  They were using bubble packs before which may reduce the absorption of the thyroid medication.  I want her to get her thyroid function tests drawn again just prior to my visit with her in January.

## 2019-12-09 NOTE — Progress Notes (Signed)
Very good, thank you

## 2019-12-19 NOTE — Progress Notes (Deleted)
Cardiology Office Note  Date: 12/19/2019   ID: Monica Carlson, DOB September 05, 1965, MRN [id number]  PCP:  Dara Lords, NP  Cardiologist:  No primary care provider on file. Electrophysiologist:  None   Chief Complaint: Follow-up CAD, cardiomyopathy, chronic combined systolic and diastolic heart failure, HTN, HLD, bilateral CVAs, Type 2 DM  History of Present Illness: Monica Carlson is a 54 y.o. female with a history of CAD (history of bare-metal stent RCA 2004), cardiomyopathy (improved EF via echocardiogram April 2019 with EF of 40 to 45%) (previously 20%), chronic combined systolic and diastolic heart failure (Entresto stopped by nephrology), HTN, HLD, DM, bilateral CVAs.  Last seen by Dr. Bronson Ing on 09/17/2018 for follow-up secondary to cardiomyopathy and CAD.  She denied any chest pain, palpitations, shortness of breath.  She complained of bilateral feet neuropathy was taking gabapentin.  She was seeing endocrinology for her diabetes.  She had been taken off Metformin.  Patient states she has been doing well from a cardiac standpoint.  She denies any recent acute illnesses, hospitalizations.  She denies any progressive anginal or exertional symptoms, palpitations or arrhythmias, orthostatic symptoms, dyspeptic symptoms, or blood in stool or urine.  Denies any CVA or TIA like symptoms.  Denies any claudication-like symptoms, DVT or PE-like symptoms, or lower extremity edema.  She has an orthopedic boot on her left leg from a fall with an ankle sprain.  States she has had some anemia recently with some iron infusions.  She will soon undergo colonoscopy with GI in [place] on 09/04/2019 to determine possible source of anemia. Patient states she has been told she snores a lot and a friend recently stayed with her told her she was having periods of apnea.  Patient denies any daytime hypersomnolence.  States she is not sure what her recent hemoglobin A1c has been.  She follows with Dr. Dorris Fetch  endocrinology in [place].  She has significant visual problems likely secondary to diabetic retinopathy.  She has stage IV renal disease with a recent creatinine of 2.48 and GFR 21.  She sees Dr. Theador Hawthorne nephrology.   Past Medical History:  Diagnosis Date  . Cervical cancer ([place])    cryotherapy and colposcopy/[place]; 1991  . CHF (congestive heart failure) ([place]) 07-2014  . Diabetes mellitus without complication ([place]) 5176  . High cholesterol   . Hypertension   . MI (myocardial infarction) ([place])    2004  . Stroke ([place]) 09/2014, 2013  . Vaginal Pap smear, abnormal     Past Surgical History:  Procedure Laterality Date  . CHOLECYSTECTOMY  1992  . CORONARY STENT PLACEMENT  2004  . TUBAL LIGATION      Current Outpatient Medications  Medication Sig Dispense Refill  . amLODipine (NORVASC) 10 MG tablet TAKE 1 TABLET BY MOUTH DAILY 90 tablet 2  . aspirin EC 81 MG tablet Take 1 tablet (81 mg total) by mouth daily. 90 tablet 2  . atorvastatin (LIPITOR) 80 MG tablet TAKE ONE TABLET BY MOUTH DAILY 90 tablet 1  . blood glucose meter kit and supplies Dispense based on patient and insurance preference. Use up to 2 times daily as directed. (FOR ICD-10 E11.65). 1 each 5  . Blood Glucose Monitoring Suppl (ACCU-CHEK GUIDE ME) w/Device KIT 1 Piece by Does not apply route as directed. 1 kit 0  . carvedilol (COREG) 3.125 MG tablet Take 1 tablet (3.125 mg total) by mouth 2 (two) times daily with a meal. 62 tablet 1  . clopidogrel (PLAVIX) 75 MG  tablet TAKE ONE TABLET BY MOUTH EVERY DAY WITH BREAKFAST 90 tablet 1  . diclofenac Sodium (VOLTAREN) 1 % GEL Apply 2 g topically 4 (four) times daily as needed (pain/swelling). 2 g 0  . gabapentin (NEURONTIN) 300 MG capsule TAKE ONE CAPSULE BY MOUTH TWICE DAILY 62 capsule 2  . glipiZIDE (GLUCOTROL) 10 MG tablet TAKE 1 TABLET BY MOUTH TWICE DAILY BEFORE a meal 180 tablet 0  . glucose blood (ACCU-CHEK GUIDE) test strip Use as instructed 4 x daily. e11.65  150 each 2  . glucose blood test strip 1 each by Other route 2 (two) times daily. Use as instructed bid. E11.65 Prodigy Meter 100 each 3  . glucose blood test strip 1 each by Other route 2 (two) times daily. Use as instructed bid. E11.65 200 each 2  . JANUVIA 50 MG tablet TAKE 1 TABLET BY MOUTH EVERY DAY 90 tablet 0  . Lancets Thin MISC 1 each by Does not apply route 4 (four) times daily. E11.65 Pt has Prodigy meter 150 each 2  . levothyroxine (SYNTHROID) 112 MCG tablet TAKE 1 TABLET BY MOUTH DAILY 90 tablet 1  . nitroGLYCERIN (NITROSTAT) 0.4 MG SL tablet Place 1 tablet (0.4 mg total) under the tongue every 5 (five) minutes as needed for chest pain. 30 tablet 2  . pantoprazole (PROTONIX) 40 MG tablet TAKE ONE TABLET BY MOUTH DAILY 90 tablet 1  . torsemide (DEMADEX) 20 MG tablet Take 20 mg by mouth daily.    . Vitamin D, Ergocalciferol, (DRISDOL) 50000 units CAPS capsule TAKE ONE CAPSULE BY MOUTH ONCE WEEKLY ON MONDAY  0   Current Facility-Administered Medications  Medication Dose Route Frequency Provider Last Rate Last Admin  . glucose blood test strip STRP 1 each  1 each Other BID Dorris Fetch, Marella Chimes, MD       Allergies:  Cedar and Other   Social History: The patient  reports that she has never smoked. She has never used smokeless tobacco. She reports that she does not drink alcohol and does not use drugs.   Family History: The patient's family history includes Diabetes in her father; Heart attack in her maternal grandfather; Heart disease in her father; Hypertension in her father; Lung cancer in her paternal grandmother; Migraines in her maternal grandfather and mother.   ROS:  Please see the history of present illness. Otherwise, complete review of systems is positive for none.  All other systems are reviewed and negative.   Physical Exam: VS:  There were no vitals taken for this visit., BMI There is no height or weight on file to calculate BMI.  Wt Readings from Last 3 Encounters:    11/19/19 194 lb (88 kg)  07/02/19 195 lb 6 oz (88.6 kg)  06/25/19 195 lb (88.5 kg)    General: Patient appears comfortable at rest. Lungs: Clear to auscultation, nonlabored breathing at rest. Cardiac: Regular rate and rhythm, no S3 or significant systolic murmur, no pericardial rub. Extremities: No pitting edema, distal pulses 2+. Skin: Warm and dry. Musculoskeletal: No kyphosis. Neuropsychiatric: Alert and oriented x3, affect grossly appropriate.  ECG:  EKG September 17, 2018 showed normal sinus rhythm rate of 68, nonspecific T wave abnormality, prolonged QT.  QT/QTc 450/478 ms.  Recent Labwork: 04/30/2019: ALT 8; AST 10; BUN 23; Creatinine, Ser 2.22; Potassium 3.9; Sodium 140 11/15/2019: TSH 21.11     Component Value Date/Time   CHOL 231 (H) 04/20/2017 1418   TRIG 301 (H) 04/20/2017 1418   HDL 37 (L) 04/20/2017  1418   CHOLHDL 6.2 (H) 04/20/2017 1418   VLDL 20 10/08/2016 0628   LDLCALC 148 (H) 04/20/2017 1418    Other Studies Reviewed Today:  Nuclear Myovue Stress 04/08/2017  There was no ST segment deviation noted during stress.  Defect 1: There is a large defect of moderate severity present in the apical anterior and apex location. There is significant reverse redistribution artifact.  Findings consistent with prior myocardial infarction.  This is an intermediate risk study.  Nuclear stress EF: 39%.   Echo 06/02/2017 Left ventricle: The cavity size was normal. Wall thickness was  increased in a pattern of mild LVH. Systolic function was mildly  to moderately reduced. The estimated ejection fraction was in the  range of 40% to 45%. Abnormal global longitudinal strain of  -14.4%. Diffuse hypokinesis. There is severe hypokinesis of the  basalinferior myocardium. Doppler parameters are consistent with  abnormal left ventricular relaxation (grade 1 diastolic  dysfunction).  - Aortic valve: Mildly calcified annulus. Trileaflet. Moderate  calcification  involving the noncoronary cusp.  - Mitral valve: Mildly calcified leaflets . There was mild  regurgitation.  - Right atrium: Central venous pressure (est): 3 mm Hg.  - Atrial septum: No defect or patent foramen ovale was identified.  - Tricuspid valve: There was trivial regurgitation.  - Pulmonary arteries: PA peak pressure: 20 mm Hg (S).  - Pericardium, extracardiac: There was no pericardial effusion.   Impressions:   - There has been improvement in LVEF compared to prior study in  August 2018.   Assessment and Plan:   1. CAD in native artery Denies any recent anginal or exertional symptoms.  She is status post BMS to RCA 2004.  Continue Plavix 75 mg.  DC full dose aspirin, start aspirin 81 mg daily.  Continue nitroglycerin sublingual as needed.  Continue Coreg 3.125 mg p.o. twice daily.  2. Cardiomyopathy, unspecified type ([place]) Cardiomyopathy was improved on last echocardiogram from 20% up to 40 to 45% on echo in April 2019. May need to schedule a repeat study at follow-up in 6 months.  3. History of CVA (cerebrovascular accident) No significant focal neurologic deficits from previous CVAs.  No new CVA or TIA-like symptoms.  4. Essential hypertension, benign Blood pressure today was 140/80.  Continue amlodipine 10 mg daily.  She has 2 diuretics on her medication list.  One is Lasix 40 mg twice daily and the other is torsemide 20 mg daily.  We will call patient's pharmacy and determine which medication she is taking.  Patient has vision issues and concern is she may be taking both inadvertently. She gets her medication pre-packaged and cannot read the medication labels. She has stage IV renal disease and sees nephrology in [place].  5. Mixed hyperlipidemia Last lipid panel noted was 2 years ago April 20, 2017.  Total cholesterol 231, HDL 37, triglycerides 301, LDL 148.  She is on high intensity statin therapy taking atorvastatin 80 mg daily.  May benefit from PCSK9  inhibitor.  6. Chronic combined systolic and diastolic heart failure ([place]) Patient denies any significant weight gain, lower extremity edema, orthopnea, PND or fatigue.  Her EF had significantly increased to 40-45% on echo on 2019 versus a previous EF of 20.  She is asymptomatic.  Medication Adjustments/Labs and Tests Ordered: Current medicines are reviewed at length with the patient today.  Concerns regarding medicines are outlined above.   Disposition: Follow-up with  Signed, Levell July, NP 12/19/2019 10:40 PM    [facility]. [address], [place], [place] [address] Phone: [phone]; Fax: [phone]

## 2019-12-20 ENCOUNTER — Ambulatory Visit: Payer: Medicare Other | Admitting: Family Medicine

## 2019-12-20 ENCOUNTER — Telehealth: Payer: Self-pay | Admitting: Family Medicine

## 2019-12-20 NOTE — Telephone Encounter (Signed)
Patient called stating that she needs to go to a dentist this afternoon.  She requested to be re-scheduled.

## 2019-12-27 ENCOUNTER — Ambulatory Visit: Payer: Medicare Other | Admitting: Cardiovascular Disease

## 2020-01-02 ENCOUNTER — Ambulatory Visit: Payer: Medicare Other | Admitting: Family Medicine

## 2020-01-12 NOTE — Progress Notes (Deleted)
Cardiology Office Note  Date: 01/13/2020   ID: Monica Carlson, DOB 03/11/1965, MRN [id number]  PCP:  Dara Lords, NP  Cardiologist:  No primary care provider on file. Electrophysiologist:  None   Chief Complaint: Follow-up CAD, cardiomyopathy, chronic combined systolic and diastolic heart failure, HTN, HLD, bilateral CVAs, Type 2 DM  History of Present Illness: Monica Carlson is a 54 y.o. female with a history of CAD (history of bare-metal stent RCA 2004), cardiomyopathy (improved EF via echocardiogram April 2019 with EF of 40 to 45%) (previously 20%), chronic combined systolic and diastolic heart failure (Entresto stopped by nephrology), HTN, HLD, DM, bilateral CVAs.  Last seen by Dr. Bronson Ing on 09/17/2018 for follow-up secondary to cardiomyopathy and CAD.  She denied any chest pain, palpitations, shortness of breath.  She complained of bilateral feet neuropathy was taking gabapentin.  She was seeing endocrinology for her diabetes.  She had been taken off Metformin.  At previous office visit on 06/25/2019 patient stated she had been doing well from a cardiac standpoint.  She denied any recent acute illnesses, hospitalizations.  She denied any progressive anginal or exertional symptoms, palpitations or arrhythmias, orthostatic symptoms, dyspeptic symptoms, or blood in stool or urine.  Reported no CVA or TIA like symptoms.  No claudication-like symptoms, DVT or PE-like symptoms, or lower extremity edema.  She had an orthopedic boot on her left leg from a fall with an ankle sprain.  Stated she had some anemia recently with some iron infusions.  She was to undergo colonoscopy with GI in [place] on 09/04/2019 to determine possible source of anemia.  She stated she had been told she snores a lot and a friend recently stayed with her told her she was having periods of apnea.  Denied any daytime hypersomnolence.  States she is not sure what her recent hemoglobin A1c has been.  She follows with Dr.  Dorris Carlson endocrinology in [place].  She has significant visual problems likely secondary to diabetic retinopathy.  She has stage IV renal disease with a recent creatinine of 2.48 and GFR 21.  She sees Dr. Theador Hawthorne nephrology.   Past Medical History:  Diagnosis Date  . Cervical cancer ([place])    cryotherapy and [place]poscopy[place]; 1991  . CHF (congestive heart failure) ([place]) 07-2014  . Diabetes mellitus without complication ([place]) 2330  . High cholesterol   . Hypertension   . MI (myocardial infarction) ([place])    2004  . Stroke ([place]) 09/2014, 2013  . Vaginal Pap smear, abnormal     Past Surgical History:  Procedure Laterality Date  . CHOLECYSTECTOMY  1992  . CORONARY STENT PLACEMENT  2004  . TUBAL LIGATION      Current Outpatient Medications  Medication Sig Dispense Refill  . amLODipine (NORVASC) 10 MG tablet TAKE 1 TABLET BY MOUTH DAILY 90 tablet 2  . aspirin EC 81 MG tablet Take 1 tablet (81 mg total) by mouth daily. 90 tablet 2  . atorvastatin (LIPITOR) 80 MG tablet TAKE ONE TABLET BY MOUTH DAILY 90 tablet 1  . blood glucose meter kit and supplies Dispense based on patient and insurance preference. Use up to 2 times daily as directed. (FOR ICD-10 E11.65). 1 each 5  . Blood Glucose Monitoring Suppl (ACCU-CHEK GUIDE ME) w/Device KIT 1 Piece by Does not apply route as directed. 1 kit 0  . carvedilol (COREG) 3.125 MG tablet Take 1 tablet (3.125 mg total) by mouth 2 (two) times daily with a meal. 62 tablet 1  .  clopidogrel (PLAVIX) 75 MG tablet TAKE ONE TABLET BY MOUTH EVERY DAY WITH BREAKFAST 90 tablet 1  . diclofenac Sodium (VOLTAREN) 1 % GEL Apply 2 g topically 4 (four) times daily as needed (pain/swelling). 2 g 0  . gabapentin (NEURONTIN) 300 MG capsule TAKE ONE CAPSULE BY MOUTH TWICE DAILY 62 capsule 2  . glipiZIDE (GLUCOTROL) 10 MG tablet TAKE 1 TABLET BY MOUTH TWICE DAILY BEFORE a meal 180 tablet 0  . glucose blood (ACCU-CHEK GUIDE) test strip Use as instructed 4 x daily.  e11.65 150 each 2  . glucose blood test strip 1 each by Other route 2 (two) times daily. Use as instructed bid. E11.65 Prodigy Meter 100 each 3  . glucose blood test strip 1 each by Other route 2 (two) times daily. Use as instructed bid. E11.65 200 each 2  . JANUVIA 50 MG tablet TAKE 1 TABLET BY MOUTH EVERY DAY 90 tablet 0  . Lancets Thin MISC 1 each by Does not apply route 4 (four) times daily. E11.65 Pt has Prodigy meter 150 each 2  . levothyroxine (SYNTHROID) 112 MCG tablet TAKE 1 TABLET BY MOUTH DAILY 90 tablet 1  . nitroGLYCERIN (NITROSTAT) 0.4 MG SL tablet Place 1 tablet (0.4 mg total) under the tongue every 5 (five) minutes as needed for chest pain. 30 tablet 2  . pantoprazole (PROTONIX) 40 MG tablet TAKE ONE TABLET BY MOUTH DAILY 90 tablet 1  . torsemide (DEMADEX) 20 MG tablet Take 20 mg by mouth daily.    . Vitamin D, Ergocalciferol, (DRISDOL) 50000 units CAPS capsule TAKE ONE CAPSULE BY MOUTH ONCE WEEKLY ON MONDAY  0   Current Facility-Administered Medications  Medication Dose Route Frequency Provider Last Rate Last Admin  . glucose blood test strip STRP 1 each  1 each Other BID Monica Carlson, Marella Chimes, MD       Allergies:  Cedar and Other   Social History: The patient  reports that she has never smoked. She has never used smokeless tobacco. She reports that she does not drink alcohol and does not use drugs.   Family History: The patient's family history includes Diabetes in her father; Heart attack in her maternal grandfather; Heart disease in her father; Hypertension in her father; Lung cancer in her paternal grandmother; Migraines in her maternal grandfather and mother.   ROS:  Please see the history of present illness. Otherwise, complete review of systems is positive for none.  All other systems are reviewed and negative.   Physical Exam: VS:  There were no vitals taken for this visit., BMI There is no height or weight on file to calculate BMI.  Wt Readings from Last 3  Encounters:  11/19/19 194 lb (88 kg)  07/02/19 195 lb 6 oz (88.6 kg)  06/25/19 195 lb (88.5 kg)    General: Patient appears comfortable at rest. Lungs: Clear to auscultation, nonlabored breathing at rest. Cardiac: Regular rate and rhythm, no S3 or significant systolic murmur, no pericardial rub. Extremities: No pitting edema, distal pulses 2+. Skin: Warm and dry. Musculoskeletal: No kyphosis. Neuropsychiatric: Alert and oriented x3, affect grossly appropriate.  ECG:  EKG September 17, 2018 showed normal sinus rhythm rate of 68, nonspecific T wave abnormality, prolonged QT.  QT/QTc 450/478 ms.  Recent Labwork: 04/30/2019: ALT 8; AST 10; BUN 23; Creatinine, Ser 2.22; Potassium 3.9; Sodium 140 11/15/2019: TSH 21.11     Component Value Date/Time   CHOL 231 (H) 04/20/2017 1418   TRIG 301 (H) 04/20/2017 1418   HDL  37 (L) 04/20/2017 1418   CHOLHDL 6.2 (H) 04/20/2017 1418   VLDL 20 10/08/2016 0628   LDLCALC 148 (H) 04/20/2017 1418    Other Studies Reviewed Today:  Nuclear Myovue Stress 04/08/2017  There was no ST segment deviation noted during stress.  Defect 1: There is a large defect of moderate severity present in the apical anterior and apex location. There is significant reverse redistribution artifact.  Findings consistent with prior myocardial infarction.  This is an intermediate risk study.  Nuclear stress EF: 39%.   Echo 06/02/2017 Left ventricle: The cavity size was normal. Wall thickness was  increased in a pattern of mild LVH. Systolic function was mildly  to moderately reduced. The estimated ejection fraction was in the  range of 40% to 45%. Abnormal global longitudinal strain of  -14.4%. Diffuse hypokinesis. There is severe hypokinesis of the  basalinferior myocardium. Doppler parameters are consistent with  abnormal left ventricular relaxation (grade 1 diastolic  dysfunction).  - Aortic valve: Mildly calcified annulus. Trileaflet. Moderate   calcification involving the noncoronary cusp.  - Mitral valve: Mildly calcified leaflets . There was mild  regurgitation.  - Right atrium: Central venous pressure (est): 3 mm Hg.  - Atrial septum: No defect or patent foramen ovale was identified.  - Tricuspid valve: There was trivial regurgitation.  - Pulmonary arteries: PA peak pressure: 20 mm Hg (S).  - Pericardium, extracardiac: There was no pericardial effusion.   Impressions:   - There has been improvement in LVEF compared to prior study in  August 2018.   Assessment and Plan:   1. CAD in native artery Denies any recent anginal or exertional symptoms.  She is status post BMS to RCA 2004.  Continue Plavix 75 mg.  DC full dose aspirin, start aspirin 81 mg daily.  Continue nitroglycerin sublingual as needed.  Continue Coreg 3.125 mg p.o. twice daily.  2. Cardiomyopathy, unspecified type ([place]) Cardiomyopathy was improved on last echocardiogram from 20% up to 40 to 45% on echo in April 2019. May need to schedule a repeat study at follow-up in 6 months.  3. History of CVA (cerebrovascular accident) No significant focal neurologic deficits from previous CVAs.  No new CVA or TIA-like symptoms.  4. Essential hypertension, benign Blood pressure today was 140/80.  Continue amlodipine 10 mg daily.  She has 2 diuretics on her medication list.  One is Lasix 40 mg twice daily and the other is torsemide 20 mg daily.  We will call patient's pharmacy and determine which medication she is taking.  Patient has vision issues and concern is she may be taking both inadvertently. She gets her medication pre-packaged and cannot read the medication labels. She has stage IV renal disease and sees nephrology in [place].  5. Mixed hyperlipidemia Last lipid panel noted was 2 years ago April 20, 2017.  Total cholesterol 231, HDL 37, triglycerides 301, LDL 148.  She is on high intensity statin therapy taking atorvastatin 80 mg daily.  May benefit  from PCSK9 inhibitor.  6. Chronic combined systolic and diastolic heart failure ([place]) Patient denies any significant weight gain, lower extremity edema, orthopnea, PND or fatigue.  Her EF had significantly increased to 40-45% on echo on 2019 versus a previous EF of 20.  She is asymptomatic.  Medication Adjustments/Labs and Tests Ordered: Current medicines are reviewed at length with the patient today.  Concerns regarding medicines are outlined above.   Disposition: Follow-up with  Signed, Levell July, NP 01/13/2020 3:04 PM  [facility]. [place], [place], [place] [address] Phone: [phone]; Fax: [phone]

## 2020-01-13 ENCOUNTER — Ambulatory Visit: Payer: Medicare Other | Admitting: Family Medicine

## 2020-02-26 ENCOUNTER — Other Ambulatory Visit: Payer: Self-pay | Admitting: "Endocrinology

## 2020-03-16 IMAGING — US US RENAL
1 series · 14 of 25 positions shown · non-contrast
Comparison: None.

CLINICAL DATA: 51-year-old diabetic hypertensive female chronic
kidney disease stage 3. Initial encounter.

EXAM:
RENAL / URINARY TRACT ULTRASOUND COMPLETE

[Series 1: us renal · 0.20mm/px · 14 of 45 slices shown]
[im 1/45]
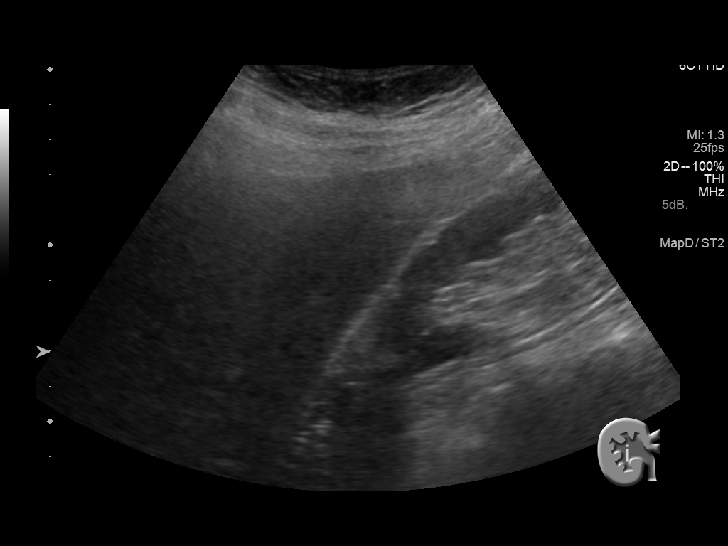
[im 4/45]
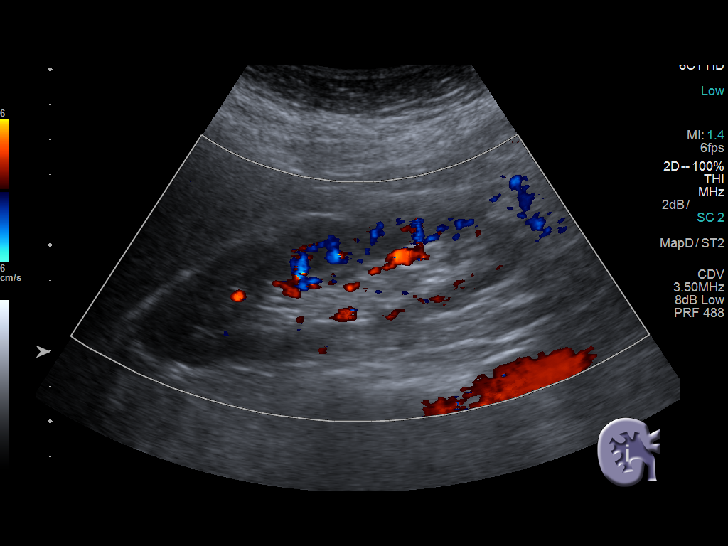
[im 8/45]
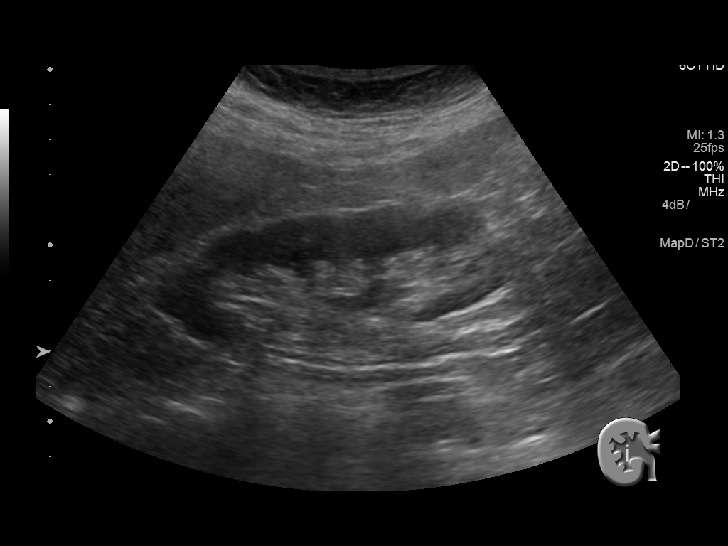
[im 12/45]
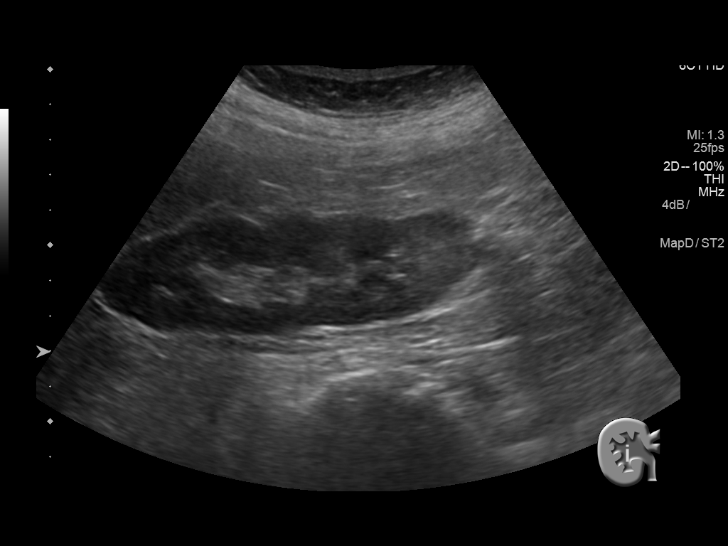
[im 15/45]
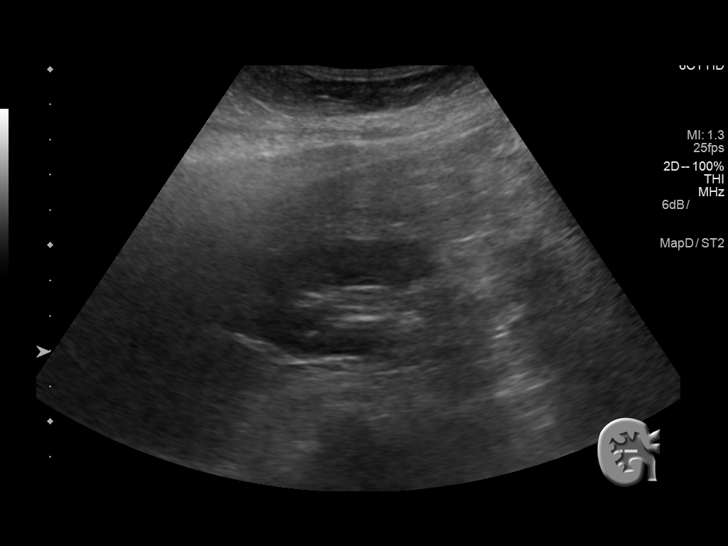
[im 17/45]
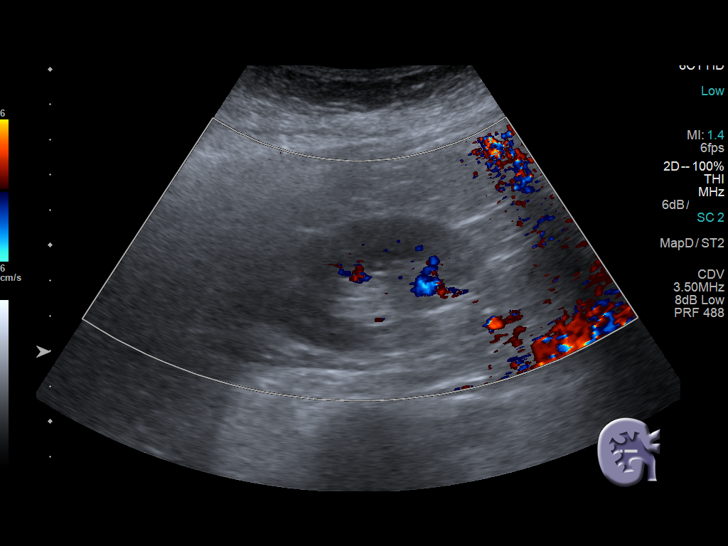
[im 21/45]
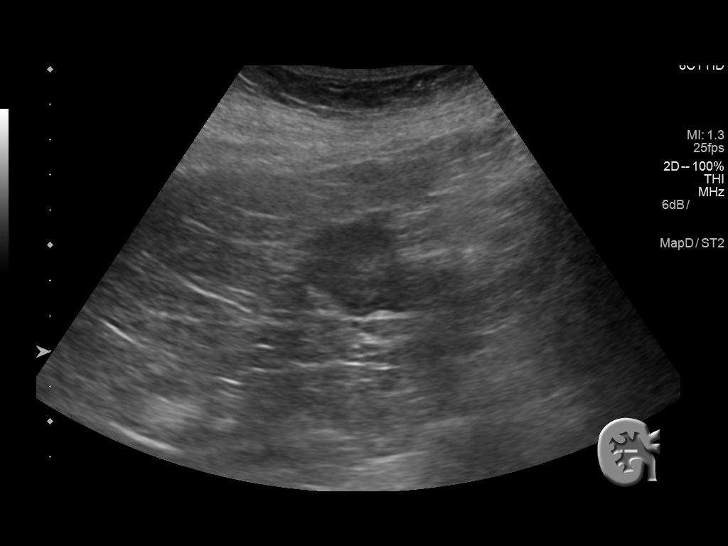
[im 24/45]
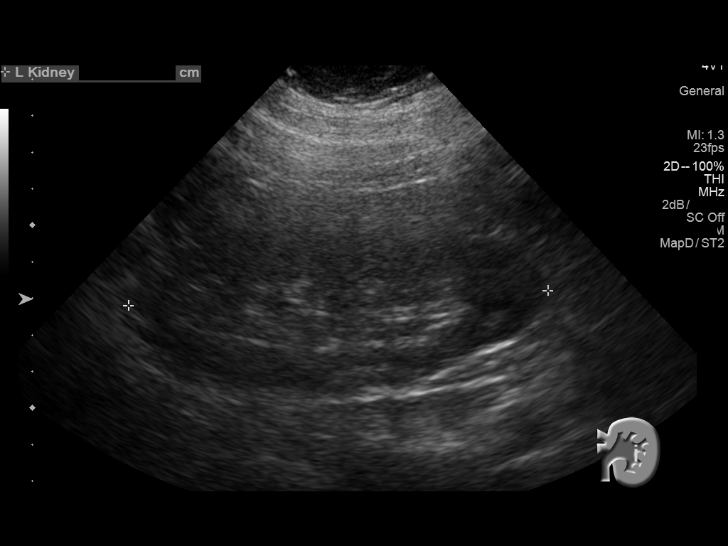
[im 28/45]
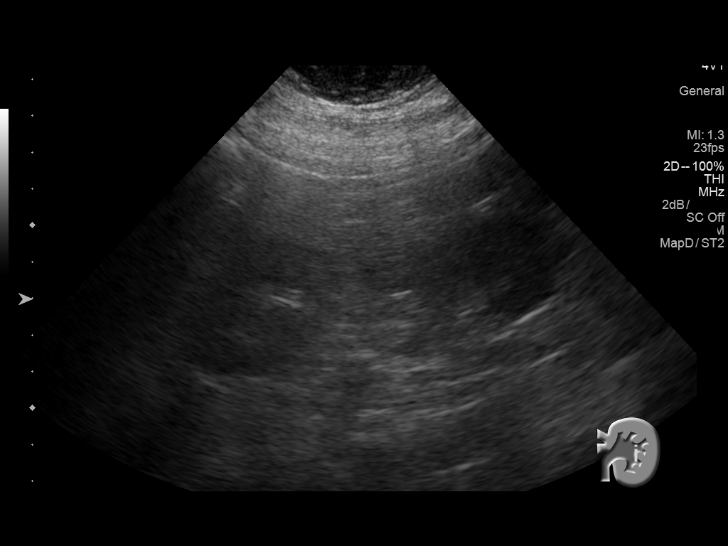
[im 30/45]
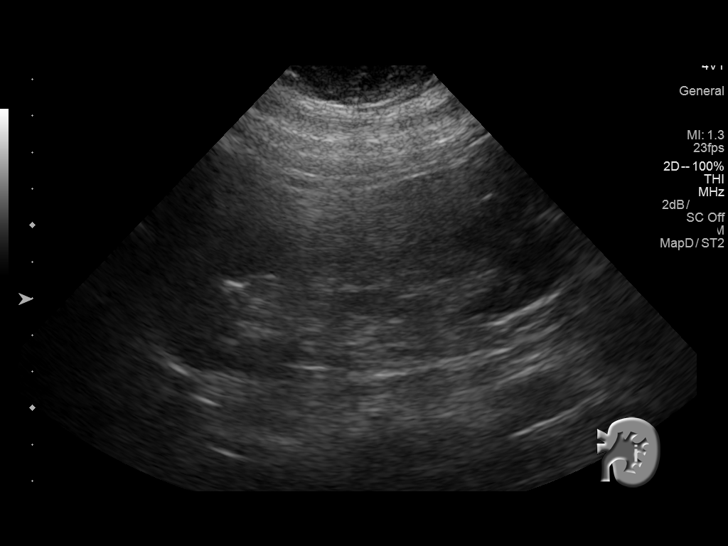
[im 34/45]
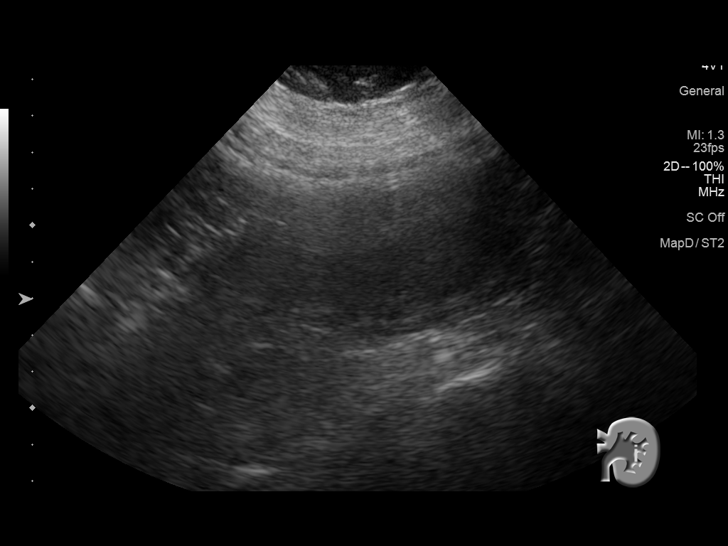
[im 37/45]
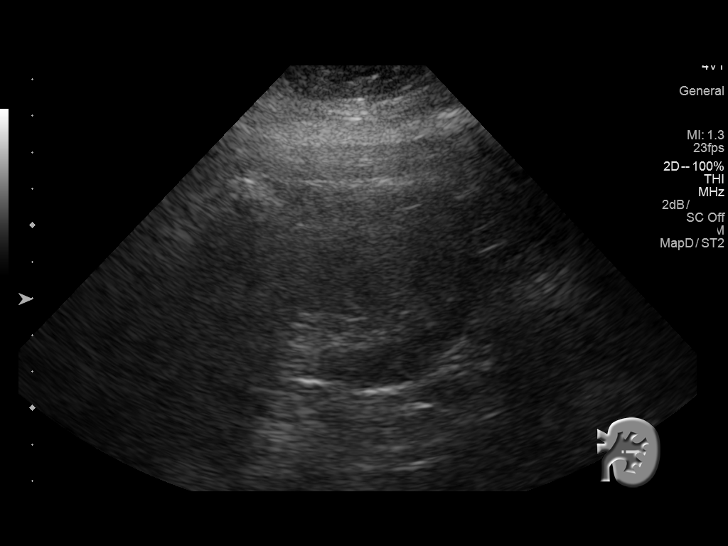
[im 41/45]
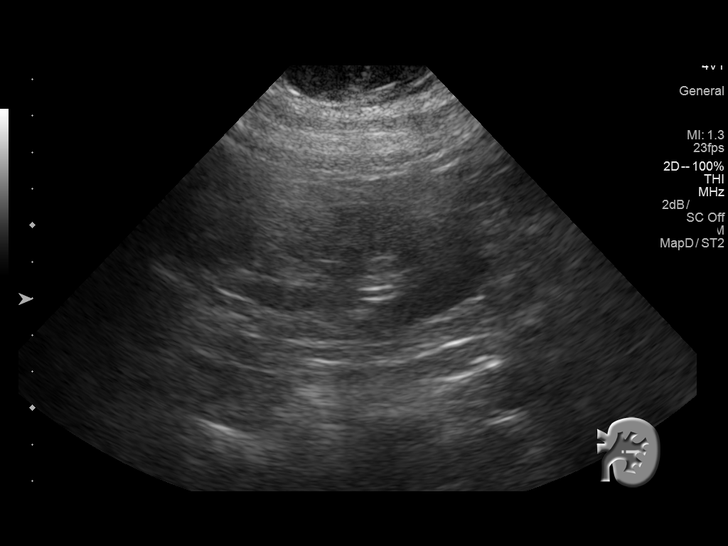
[im 45/45]
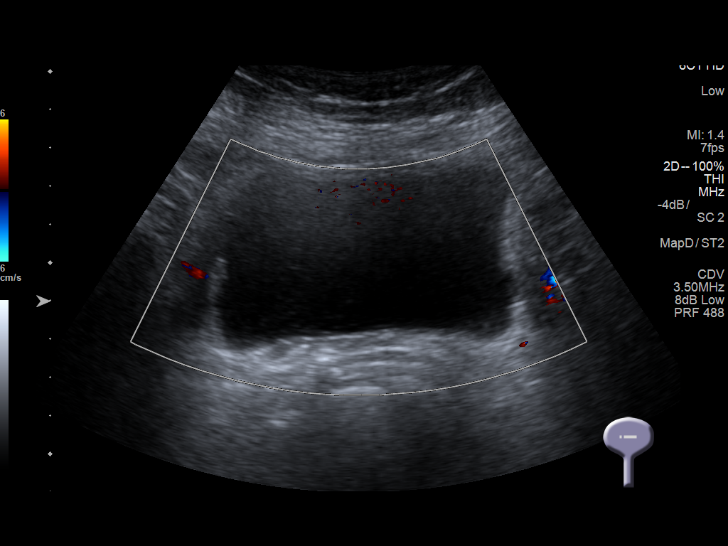

[14 of 25 positions shown; findings below may reference images not displayed]

FINDINGS: Right Kidney:

Length: 11.1 cm. Echogenicity within normal limits. No mass or
hydronephrosis visualized.

Left Kidney:

Length: 11.5 cm. Echogenicity within normal limits. No mass or
hydronephrosis visualized.

Bladder:

Appears normal for degree of bladder distention. Ureteral jets not
visualized during exam.

Present exam slightly limited by bowel gas.
IMPRESSION: Negative renal sonogram.

## 2020-03-18 LAB — COMPREHENSIVE METABOLIC PANEL
Albumin: 3.6 (ref 3.5–5.0)
Calcium: 9.3 (ref 8.7–10.7)
GFR calc Af Amer: 30
GFR calc non Af Amer: 26

## 2020-03-18 LAB — BASIC METABOLIC PANEL
BUN: 24 — AB (ref 4–21)
CO2: 28 — AB (ref 13–22)
Chloride: 99 (ref 99–108)
Creatinine: 2.1 — AB (ref 0.5–1.1)
Glucose: 249
Potassium: 3.7 (ref 3.4–5.3)
Sodium: 137 (ref 137–147)

## 2020-03-18 LAB — TSH: TSH: 0.8 (ref 0.41–5.90)

## 2020-03-18 LAB — HEMOGLOBIN A1C: Hemoglobin A1C: 9.6

## 2020-03-18 LAB — HEPATIC FUNCTION PANEL
ALT: 20 (ref 7–35)
AST: 11 — AB (ref 13–35)
Bilirubin, Total: 0.6

## 2020-03-18 LAB — LIPID PANEL
Cholesterol: 290 — AB (ref 0–200)
HDL: 37 (ref 35–70)
Triglycerides: 706 — AB (ref 40–160)

## 2020-03-23 ENCOUNTER — Ambulatory Visit: Payer: Medicare Other | Admitting: Nurse Practitioner

## 2020-03-30 ENCOUNTER — Other Ambulatory Visit: Payer: Self-pay | Admitting: "Endocrinology

## 2020-04-29 ENCOUNTER — Other Ambulatory Visit: Payer: Self-pay | Admitting: Nurse Practitioner

## 2020-05-19 ENCOUNTER — Other Ambulatory Visit: Payer: Self-pay

## 2020-05-19 ENCOUNTER — Encounter: Payer: Self-pay | Admitting: Nurse Practitioner

## 2020-05-19 ENCOUNTER — Ambulatory Visit (INDEPENDENT_AMBULATORY_CARE_PROVIDER_SITE_OTHER): Payer: Medicare Other | Admitting: Nurse Practitioner

## 2020-05-19 VITALS — BP 138/82 | HR 69 | Ht 61.0 in | Wt 190.0 lb

## 2020-05-19 DIAGNOSIS — E559 Vitamin D deficiency, unspecified: Secondary | ICD-10-CM

## 2020-05-19 DIAGNOSIS — E1122 Type 2 diabetes mellitus with diabetic chronic kidney disease: Secondary | ICD-10-CM | POA: Diagnosis not present

## 2020-05-19 DIAGNOSIS — N1831 Chronic kidney disease, stage 3a: Secondary | ICD-10-CM | POA: Diagnosis not present

## 2020-05-19 DIAGNOSIS — E038 Other specified hypothyroidism: Secondary | ICD-10-CM | POA: Diagnosis not present

## 2020-05-19 DIAGNOSIS — E782 Mixed hyperlipidemia: Secondary | ICD-10-CM | POA: Diagnosis not present

## 2020-05-19 DIAGNOSIS — I10 Essential (primary) hypertension: Secondary | ICD-10-CM

## 2020-05-19 DIAGNOSIS — E063 Autoimmune thyroiditis: Secondary | ICD-10-CM

## 2020-05-19 LAB — POCT GLYCOSYLATED HEMOGLOBIN (HGB A1C): Hemoglobin A1C: 9 % — AB (ref 4.0–5.6)

## 2020-05-19 MED ORDER — FENOFIBRATE 145 MG PO TABS: 145.0000 mg | ORAL_TABLET | Freq: Every day | ORAL | 3 refills | Status: DC

## 2020-05-19 NOTE — Patient Instructions (Signed)

## 2020-05-19 NOTE — Progress Notes (Signed)
05/19/2020, 1:32 PM        Endocrinology follow-up note                                       She is accompanied by her care coordinator  Mr. Nolon Nations. All issues noted in this document were discussed and addressed.   Subjective:    Patient ID: Monica Carlson, female    DOB: 16-Jan-1966.  Dariela Stoker is being seen for follow-up in  management of currently uncontrolled symptomatic diabetes requested by  Dekoninck, Dahlia Bailiff, NP.   Past Medical History:  Diagnosis Date  . Cervical cancer ([place])    cryotherapy and colposcopy/[place], [place]; 1991  . CHF (congestive heart failure) ([place]) 07-2014  . Diabetes mellitus without complication ([place]) 8280  . High cholesterol   . Hypertension   . MI (myocardial infarction) ([place])    2004  . Stroke ([place]) 09/2014, 2013  . Vaginal Pap smear, abnormal     Past Surgical History:  Procedure Laterality Date  . CHOLECYSTECTOMY  1992  . CORONARY STENT PLACEMENT  2004  . TUBAL LIGATION      Social History   Socioeconomic History  . Marital status: Legally Separated    Spouse name: Not on file  . Number of children: Not on file  . Years of education: Not on file  . Highest education level: Not on file  Occupational History  . Not on file  Tobacco Use  . Smoking status: Never Smoker  . Smokeless tobacco: Never Used  Vaping Use  . Vaping Use: Never used  Substance and Sexual Activity  . Alcohol use: No  . Drug use: No  . Sexual activity: Yes    Birth control/protection: None, Surgical    Comment: tubal  Other Topics Concern  . Not on file  Social History Narrative   Lives alone.    Has some friends.    Neighbors.    Social Determinants of Health   Financial Resource Strain: Not on file  Food Insecurity: Not on file  Transportation Needs: Not on file  Physical Activity: Not on file  Stress: Not on file  Social Connections: Not on file    Family History   Problem Relation Age of Onset  . Migraines Mother   . Heart disease Father   . Hypertension Father   . Diabetes Father   . Migraines Maternal Grandfather   . Heart attack Maternal Grandfather   . Lung cancer Paternal Grandmother   . Colon cancer Neg Hx   . Colon polyps Neg Hx     Outpatient Encounter Medications as of 05/19/2020  Medication Sig  . fenofibrate (TRICOR) 145 MG tablet Take 1 tablet (145 mg total) by mouth daily.  . furosemide (LASIX) 20 MG tablet Take 20 mg by mouth 2 (two) times daily.  Marland Kitchen amLODipine (NORVASC) 10 MG tablet TAKE 1 TABLET BY MOUTH DAILY  . aspirin EC 81 MG tablet Take 1 tablet (81 mg total) by mouth daily.  Marland Kitchen atorvastatin (LIPITOR) 80 MG tablet TAKE ONE TABLET BY MOUTH DAILY  . Blood Glucose  Monitoring Suppl (ACCU-CHEK GUIDE ME) w/Device KIT 1 Piece by Does not apply route as directed.  . carvedilol (COREG) 3.125 MG tablet Take 1 tablet (3.125 mg total) by mouth 2 (two) times daily with a meal.  . clopidogrel (PLAVIX) 75 MG tablet TAKE ONE TABLET BY MOUTH EVERY DAY WITH BREAKFAST  . diclofenac Sodium (VOLTAREN) 1 % GEL Apply 2 g topically 4 (four) times daily as needed (pain/swelling).  . gabapentin (NEURONTIN) 300 MG capsule TAKE ONE CAPSULE BY MOUTH TWICE DAILY  . glipiZIDE (GLUCOTROL) 10 MG tablet TAKE 1 TABLET BY MOUTH TWICE DAILY BEFORE meal  . glucose blood (ACCU-CHEK GUIDE) test strip Use as instructed 4 x daily. e11.65  . glucose blood test strip 1 each by Other route 2 (two) times daily. Use as instructed bid. E11.65  . JANUVIA 50 MG tablet TAKE 1 TABLET BY MOUTH EVERY DAY  . Lancets Thin MISC 1 each by Does not apply route 4 (four) times daily. E11.65 Pt has Prodigy meter  . levothyroxine (SYNTHROID) 112 MCG tablet TAKE 1 TABLET BY MOUTH DAILY  . nitroGLYCERIN (NITROSTAT) 0.4 MG SL tablet Place 1 tablet (0.4 mg total) under the tongue every 5 (five) minutes as needed for chest pain.  . pantoprazole (PROTONIX) 40 MG tablet TAKE ONE TABLET BY MOUTH  DAILY  . Vitamin D, Ergocalciferol, (DRISDOL) 50000 units CAPS capsule TAKE ONE CAPSULE BY MOUTH ONCE WEEKLY ON MONDAY  . [DISCONTINUED] blood glucose meter kit and supplies Dispense based on patient and insurance preference. Use up to 2 times daily as directed. (FOR ICD-10 E11.65).  . [DISCONTINUED] glucose blood test strip 1 each by Other route 2 (two) times daily. Use as instructed bid. E11.65 Prodigy Meter  . [DISCONTINUED] torsemide (DEMADEX) 20 MG tablet Take 20 mg by mouth daily.   Facility-Administered Encounter Medications as of 05/19/2020  Medication  . glucose blood test strip STRP 1 each    ALLERGIES: Allergies  Allergen Reactions  . Cedar     Swelling   . Other     Teriyaki- swelling    VACCINATION STATUS: Immunization History  Administered Date(s) Administered  . Tdap 04/20/2017    Diabetes She presents for her follow-up diabetic visit. She has type 2 diabetes mellitus. Onset time: She was diagnosed at approximate age of 29 years. Her disease course has been worsening. Pertinent negatives for hypoglycemia include no confusion, headaches, nervousness/anxiousness, pallor, seizures, sweats or tremors. Associated symptoms include fatigue, polydipsia and polyuria. Pertinent negatives for diabetes include no chest pain and no polyphagia. Pertinent negatives for hypoglycemia complications include no required assistance. Symptoms are stable. Diabetic complications include a CVA, heart disease and nephropathy. Risk factors for coronary artery disease include diabetes mellitus, dyslipidemia, family history, hypertension, sedentary lifestyle, post-menopausal and obesity. Current diabetic treatment includes oral agent (dual therapy). She is compliant with treatment most of the time. Her weight is fluctuating minimally. She is following a generally unhealthy diet. When asked about meal planning, she reported none. She has not had a previous visit with a dietitian. She never participates  in exercise. Her home blood glucose trend is increasing steadily. Her breakfast blood glucose range is generally 180-200 mg/dl. Her bedtime blood glucose range is generally >200 mg/dl. (She presents today accompanied by her care attendant, Johnny, with her meter and logs showing above target fasting and postprandial glycemic profile.  Her most recent A1c was 9.6% on 03/18/20 worsening from previous visit of 7.7%.  She denies any significant hypoglycemia.  Up to this point,  she has adamantly refused treatment with insulin and continues today.  ) An ACE inhibitor/angiotensin II receptor blocker is not being taken. She does not see a podiatrist.Eye exam is current.  Hyperlipidemia This is a chronic problem. The current episode started more than 1 year ago. The problem is uncontrolled. Exacerbating diseases include chronic renal disease, diabetes, hypothyroidism and obesity. Factors aggravating her hyperlipidemia include beta blockers and fatty foods. Pertinent negatives include no chest pain, myalgias or shortness of breath. Current antihyperlipidemic treatment includes statins. The current treatment provides mild improvement of lipids. Compliance problems include adherence to diet and adherence to exercise.  Risk factors for coronary artery disease include dyslipidemia, diabetes mellitus, hypertension, obesity, family history, a sedentary lifestyle and post-menopausal.  Hypertension This is a chronic problem. The current episode started more than 1 year ago. The problem has been gradually improving since onset. The problem is controlled. Pertinent negatives include no chest pain, headaches, palpitations, shortness of breath or sweats. Agents associated with hypertension include thyroid hormones. Risk factors for coronary artery disease include dyslipidemia, diabetes mellitus, obesity, post-menopausal state and sedentary lifestyle. Past treatments include calcium channel blockers, beta blockers and diuretics. The  current treatment provides mild improvement. Compliance problems include diet and exercise.  Hypertensive end-organ damage includes kidney disease, CAD/MI, CVA and heart failure. Identifiable causes of hypertension include chronic renal disease, renovascular disease and a thyroid problem.   Review of systems  Constitutional: + Minimally fluctuating body weight,  current Body mass index is 35.9 kg/m. , no fatigue, no subjective hyperthermia, no subjective hypothermia Eyes: legally blind, no xerophthalmia ENT: no sore throat, no nodules palpated in throat, no dysphagia/odynophagia, no hoarseness Cardiovascular: no chest pain, no shortness of breath, no palpitations, no leg swelling Respiratory: no cough, no shortness of breath Gastrointestinal: no nausea/vomiting/diarrhea Musculoskeletal: no muscle/joint aches Skin: no rashes, no hyperemia Neurological: no tremors, no numbness, no tingling, no dizziness Psychiatric: no depression, no anxiety  Objective:    BP 138/82   Pulse 69   Ht '5\' 1"'  (1.549 m)   Wt 190 lb (86.2 kg)   BMI 35.90 kg/m   Wt Readings from Last 3 Encounters:  05/19/20 190 lb (86.2 kg)  11/19/19 194 lb (88 kg)  07/02/19 195 lb 6 oz (88.6 kg)    BP Readings from Last 3 Encounters:  05/19/20 138/82  11/19/19 (!) 153/85  07/02/19 (!) 182/106     Physical Exam- Limited  Constitutional:  Body mass index is 35.9 kg/m. , not in acute distress, normal state of mind Eyes:  EOMI, no exophthalmos Neck: Supple Thyroid: No gross goiter Cardiovascular: RRR, no murmers, rubs, or gallops, no edema Respiratory: Adequate breathing efforts, no crackles, rales, rhonchi, or wheezing Musculoskeletal: no gross deformities, strength intact in all four extremities, no gross restriction of joint movements Skin:  no rashes, no hyperemia Neurological: no tremor with outstretched hands   CMP     Component Value Date/Time   NA 137 03/18/2020 0000   K 3.7 03/18/2020 0000   CL 99  03/18/2020 0000   CO2 28 (A) 03/18/2020 0000   GLUCOSE 141 (H) 04/30/2019 0928   GLUCOSE 57 (L) 05/24/2017 1111   BUN 24 (A) 03/18/2020 0000   CREATININE 2.1 (A) 03/18/2020 0000   CREATININE 2.22 (H) 04/30/2019 0928   CREATININE 2.18 (H) 05/24/2017 1111   CALCIUM 9.3 03/18/2020 0000   PROT 7.3 04/30/2019 0928   ALBUMIN 3.6 03/18/2020 0000   ALBUMIN 4.0 04/30/2019 0928   AST 11 (A) 03/18/2020  0000   ALT 20 03/18/2020 0000   ALKPHOS 124 (H) 04/30/2019 0928   BILITOT 0.3 04/30/2019 0928   GFRNONAA 26 03/18/2020 0000   GFRNONAA >89 10/23/2015 1008   GFRAA 30 03/18/2020 0000   GFRAA >89 10/23/2015 1008     Diabetic Labs (most recent): Lab Results  Component Value Date   HGBA1C 9.0 (A) 05/19/2020   HGBA1C 9.6 03/18/2020   HGBA1C 7.7 (A) 11/19/2019     Lipid Panel ( most recent) Lipid Panel     Component Value Date/Time   CHOL 290 (A) 03/18/2020 0000   TRIG 706 (A) 03/18/2020 0000   HDL 37 03/18/2020 0000   CHOLHDL 6.2 (H) 04/20/2017 1418   VLDL 20 10/08/2016 0628   LDLCALC 148 (H) 04/20/2017 1418      Lab Results  Component Value Date   TSH 0.80 03/18/2020   TSH 21.11 (A) 11/15/2019   TSH 5.150 (H) 04/30/2019   TSH 3.08 04/20/2017   TSH 5.98 (H) 01/16/2017   TSH 7.08 (H) 11/15/2016   TSH 17.574 (H) 10/07/2016   TSH 9.64 (H) 10/23/2015   TSH 7.43 (H) 05/22/2015   FREET4 1.20 04/30/2019      Assessment & Plan:   1) Diabetes mellitus without complication (HCC)  - Brya Simerly has currently controlled symptomatic type 2 DM since  55 years of age.  She presents today accompanied by her care attendant, Johnny, with her meter and logs showing above target fasting and postprandial glycemic profile.  Her most recent A1c was 9.6% on 03/18/20 worsening from previous visit of 7.7%.  She denies any significant hypoglycemia.  Up to this point, she has adamantly refused treatment with insulin and continues today.    -She has complications including CKD stage 4, legally  blind.    -She is accompanied by her care coordinator, Mr. [name], I had a long discussion with her about the progressive nature of diabetes and the pathology behind its complications.  -her diabetes is complicated by coronary artery disease, nephropathy , obesity/sedentary life and she remains at a high risk for more acute and chronic complications which include CAD, CVA, CKD, retinopathy, and neuropathy. These are all discussed in detail with her.  - Nutritional counseling repeated at each appointment due to patients tendency to fall back in to old habits.  - The patient admits there is a room for improvement in their diet and drink choices. -  Suggestion is made for the patient to avoid simple carbohydrates from their diet including Cakes, Sweet Desserts / Pastries, Ice Cream, Soda (diet and regular), Sweet Tea, Candies, Chips, Cookies, Sweet Pastries,  Store Bought Juices, Alcohol in Excess of  1-2 drinks a day, Artificial Sweeteners, Coffee Creamer, and "Sugar-free" Products. This will help patient to have stable blood glucose profile and potentially avoid unintended weight gain.   - I encouraged the patient to switch to  unprocessed or minimally processed complex starch and increased protein intake (animal or plant source), fruits, and vegetables.   - Patient is advised to stick to a routine mealtimes to eat 3 meals  a day and avoid unnecessary snacks ( to snack only to correct hypoglycemia).  - she will be scheduled with Jearld Fenton, RDN, CDE for individualized diabetes education.  - This patient has significant cognitive deficit, and she has not been willing to start insulin treatment, and still adamantly refuses.  Although her glycemic profile is worse, no changes will be made to her medications today because she will not  inject insulin.  She is on max dose of Glipizide and cannot increase Januvia any higher due to kidney disease.   She is advised to continue Januvia 50 mg po daily and  Glipizide 10 mg po twice daily with meals.  -She is encouraged to continue monitoring blood glucose at least twice daily, before breakfast and before bed and call the clinic if readings are less than 70 or greater than 300 for 3 tests in a row.   - she is not a candidate for Metformin, SGLT2 inhibitors due to concurrent renal insufficiency.  - Patient specific target  A1c;  LDL, HDL, Triglycerides, and  Waist Circumference were discussed in detail.  2) Blood Pressure /Hypertension: Her blood pressure is controlled to target.  She is advised to continue Amlodipine 10 mg po daily and Carvedilol 3.125 mg po twice daily.  Will defer med changes to nephrologist.  3) Lipids/Hyperlipidemia:    Her most recent lipid panel from 03/18/20 shows worsening triglycerides of 706.  She is advised to continue Atorvastatin 80 mg po daily at bedtime.  Side effects and precautions discussed with her. I discussed and initiated Fenofibrate 145 mg po daily.  She is advised to avoid fried foods and butter.  She reports an extensive family history of uncontrolled hyperlipidemia.  4)  Weight/Diet: - Her Body mass index is 35.9 kg/m.-   clearly complicating her diabetes care.  I discussed with her the fact that loss of 5 - 10% of her  current body weight will have the most impact on her diabetes management.  CDE Consult will be initiated . Exercise, and detailed carbohydrates information provided  -  detailed on discharge instructions.  5) Hypothyroidism: Her most recent TFTs are consistent with appropriate hormone replacement.  She is advised to continue Levothyroxine 112 mcg po daily before breakfast.    - We discussed about the correct intake of her thyroid hormone, on empty stomach at fasting, with water, separated by at least 30 minutes from breakfast and other medications,  and separated by more than 4 hours from calcium, iron, multivitamins, acid reflux medications (PPIs). -Patient is made aware of the fact that  thyroid hormone replacement is needed for life, dose to be adjusted by periodic monitoring of thyroid function tests.  6) Chronic Care/Health Maintenance: -she is on Statin medications and is encouraged to initiate and continue to follow up with Ophthalmology, Dentist, Podiatrist at least yearly or according to recommendations, and advised to stay away from smoking. I have recommended yearly flu vaccine and pneumonia vaccine at least every 5 years; moderate intensity exercise for up to 150 minutes weekly; and sleep for at least 7 hours a day.  - she is advised to maintain close follow up with Dekoninck, Eustaquio Maize A, NP for primary care needs, as well as her other providers for optimal and coordinated care.   - Time spent on this patient care encounter:  40 min, of which > 50% was spent in  counseling and the rest reviewing her blood glucose logs , discussing her hypoglycemia and hyperglycemia episodes, reviewing her current and  previous labs / studies  ( including abstraction from other facilities) and medications  doses and developing a  long term treatment plan and documenting her care.   Please refer to Patient Instructions for Blood Glucose Monitoring and Insulin/Medications Dosing Guide" in media tab for additional information. Please  also refer to "Patient Self Inventory" in the Media tab for reviewed elements of pertinent patient history.  Lattie Haw  Leiphart participated in the discussions, expressed understanding, and voiced agreement with the above plans.  All questions were answered to her satisfaction. she is encouraged to contact clinic should she have any questions or concerns prior to her return visit.   Follow up plan: - Return in about 3 months (around 08/19/2020) for Diabetes follow up with A1c in office, Thyroid follow up, Previsit labs, Bring glucometer and logs.  Rayetta Pigg, [facility] [address] [place], [place] [address] Phone:  [phone] Fax: [phone]  05/19/2020, 1:32 PM

## 2020-05-24 ENCOUNTER — Other Ambulatory Visit: Payer: Self-pay | Admitting: Nurse Practitioner

## 2020-06-10 ENCOUNTER — Telehealth: Payer: Self-pay

## 2020-06-10 NOTE — Telephone Encounter (Signed)
Patient notified, will do labs, mailed orders to her

## 2020-06-10 NOTE — Telephone Encounter (Signed)
Hard to know if it is the thyroid, but it has been a while since her thyroid labs were checked.  We can go ahead and repeat her TSH and Free T4 level now to see what might be going on.

## 2020-06-10 NOTE — Telephone Encounter (Signed)
Patient states she has no appetite anymore and she is sleeping a lot more than normal. She is wondering if this has anything to do with her thyroid. Please call pt [phone]

## 2020-06-18 NOTE — Progress Notes (Addendum)
Cardiology Office Note  Date: 06/19/2020   ID: TRENT THEISEN, DOB 10-28-1965, MRN [id number]  PCP:  Dara Lords, NP  Cardiologist:  No primary care provider on file. Electrophysiologist:  None   Chief Complaint: Follow-up CAD, cardiomyopathy, chronic combined systolic and diastolic heart failure, HTN, HLD, bilateral CVAs, Type 2 DM  History of Present Illness: Monica Carlson is a 55 y.o. female with a history of CAD (history of bare-metal stent RCA 2004), cardiomyopathy (improved EF via echocardiogram April 2019 with EF of 40 to 45%) (previously 20%), chronic combined systolic and diastolic heart failure (Entresto stopped by nephrology), HTN, HLD, DM, bilateral CVAs.  Last seen by Dr. Bronson Ing on 09/17/2018 for follow-up secondary to cardiomyopathy and CAD.  She denied any chest pain, palpitations, shortness of breath.  She complained of bilateral feet neuropathy was taking gabapentin.  She was seeing endocrinology for her diabetes.  She had been taken off Metformin.  She has had some anemia recently with some iron infusions.  At last visit she was to undergo colonoscopy with GI in [place] on 09/04/2019 to determine possible source of anemia.  At last visit she was told she has been told she snores a lot and a friend recently stayed with her told her she was having periods of apnea.   She follows with Dr. Dorris Fetch endocrinology in [place] for diabetes.  She has significant visual problems likely secondary to diabetic retinopathy.  She has stage IV renal disease with a recent creatinine of 2.48 and GFR 21.  She sees Dr. Theador Hawthorne nephrology.  She is here today with several complaints.  She states she has been having fluctuating blood pressures.  Also complaining of periods of dizziness.  She has a history of CVA with a left-sided weakness and states she is blind in her left eye.  Also complaining of problems sleeping.  States she has problems going to sleep, staying asleep.  She states she  does snore and has periods of apnea and wakes herself up gasping.  States sometimes when sleeping at night the next day she has excessive daytime sleepiness.  He denies any anginal or exertional symptoms.  Denies any symptoms of palpitations or arrhythmias.  She states she has been researching online and believes she may have vertigo.  He states the dizziness occurs when she is sitting.  She denies any dizziness when changing positions.  She denies any CVA or TIA-like symptoms like she has had in the past.  She states she has had a total of 4 CVAs.  She uses a cane to mobilize.  She is here with her Education officer, museum today.  Blood pressure appears well controlled at 128/74.  Heart rate is 70.  She has  issues with anemia and has had IV iron infusions in the past.   Past Medical History:  Diagnosis Date  . Cervical cancer ([place])    cryotherapy and colposcopy/[place], [place]; 1991  . CHF (congestive heart failure) ([place]) 07-2014  . Diabetes mellitus without complication ([place]) 5102  . High cholesterol   . Hypertension   . MI (myocardial infarction) ([place])    2004  . Stroke ([place]) 09/2014, 2013  . Vaginal Pap smear, abnormal     Past Surgical History:  Procedure Laterality Date  . CHOLECYSTECTOMY  1992  . CORONARY STENT PLACEMENT  2004  . TUBAL LIGATION      Current Outpatient Medications  Medication Sig Dispense Refill  . amLODipine (NORVASC) 10 MG tablet TAKE 1 TABLET  BY MOUTH DAILY 90 tablet 2  . aspirin EC 81 MG tablet Take 1 tablet (81 mg total) by mouth daily. 90 tablet 2  . atorvastatin (LIPITOR) 80 MG tablet TAKE ONE TABLET BY MOUTH DAILY 90 tablet 1  . Blood Glucose Monitoring Suppl (ACCU-CHEK GUIDE ME) w/Device KIT 1 Piece by Does not apply route as directed. 1 kit 0  . carvedilol (COREG) 3.125 MG tablet Take 1 tablet (3.125 mg total) by mouth 2 (two) times daily with a meal. 62 tablet 1  . clopidogrel (PLAVIX) 75 MG tablet TAKE ONE TABLET BY MOUTH EVERY DAY WITH BREAKFAST 90 tablet 1   . diclofenac Sodium (VOLTAREN) 1 % GEL Apply 2 g topically 4 (four) times daily as needed (pain/swelling). 2 g 0  . fenofibrate (TRICOR) 145 MG tablet Take 1 tablet (145 mg total) by mouth daily. 90 tablet 3  . furosemide (LASIX) 20 MG tablet Take 20 mg by mouth 2 (two) times daily.    Marland Kitchen gabapentin (NEURONTIN) 300 MG capsule TAKE ONE CAPSULE BY MOUTH TWICE DAILY 62 capsule 2  . glipiZIDE (GLUCOTROL) 10 MG tablet TAKE 1 TABLET BY MOUTH TWICE DAILY BEFORE meal 60 tablet 0  . glucose blood (ACCU-CHEK GUIDE) test strip Use as instructed 4 x daily. e11.65 150 each 2  . glucose blood test strip 1 each by Other route 2 (two) times daily. Use as instructed bid. E11.65 200 each 2  . JANUVIA 50 MG tablet TAKE 1 TABLET BY MOUTH EVERY DAY 30 tablet 1  . Lancets Thin MISC 1 each by Does not apply route 4 (four) times daily. E11.65 Pt has Prodigy meter 150 each 2  . levothyroxine (SYNTHROID) 112 MCG tablet TAKE 1 TABLET BY MOUTH EVERY DAY - must make APPOINTMENT FOR MORE REFILLS 30 tablet 1  . nitroGLYCERIN (NITROSTAT) 0.4 MG SL tablet Place 1 tablet (0.4 mg total) under the tongue every 5 (five) minutes as needed for chest pain. 30 tablet 2  . pantoprazole (PROTONIX) 40 MG tablet TAKE ONE TABLET BY MOUTH DAILY 90 tablet 1  . Vitamin D, Ergocalciferol, (DRISDOL) 50000 units CAPS capsule TAKE ONE CAPSULE BY MOUTH ONCE WEEKLY ON MONDAY  0   Current Facility-Administered Medications  Medication Dose Route Frequency Provider Last Rate Last Admin  . glucose blood test strip STRP 1 each  1 each Other BID Dorris Fetch, Marella Chimes, MD       Allergies:  Cedar and Other   Social History: The patient  reports that she has never smoked. She has never used smokeless tobacco. She reports that she does not drink alcohol and does not use drugs.   Family History: The patient's family history includes Diabetes in her father; Heart attack in her maternal grandfather; Heart disease in her father; Hypertension in her father; Lung  cancer in her paternal grandmother; Migraines in her maternal grandfather and mother.   ROS:  Please see the history of present illness. Otherwise, complete review of systems is positive for none.  All other systems are reviewed and negative.   Physical Exam: VS:  BP 128/74   Pulse 70   Ht '5\' 1"'  (1.549 m)   Wt 193 lb (87.5 kg)   SpO2 94%   BMI 36.47 kg/m , BMI Body mass index is 36.47 kg/m.  Wt Readings from Last 3 Encounters:  06/19/20 193 lb (87.5 kg)  05/19/20 190 lb (86.2 kg)  11/19/19 194 lb (88 kg)    General: Patient appears comfortable at rest. Lungs: Clear  to auscultation, nonlabored breathing at rest. Cardiac: Regular rate and rhythm, no S3 or significant systolic murmur, no pericardial rub. Extremities: No pitting edema, distal pulses 2+. Skin: Warm and dry. Musculoskeletal: No kyphosis. Neuropsychiatric: Alert and oriented x3, affect grossly appropriate.  ECG: 06/19/2020 EKG normal sinus rhythm rate of 68 nonspecific T wave abnormality.   Recent Labwork: 03/18/2020: ALT 20; AST 11; BUN 24; Creatinine 2.1; Potassium 3.7; Sodium 137; TSH 0.80     Component Value Date/Time   CHOL 290 (A) 03/18/2020 0000   TRIG 706 (A) 03/18/2020 0000   HDL 37 03/18/2020 0000   CHOLHDL 6.2 (H) 04/20/2017 1418   VLDL 20 10/08/2016 0628   LDLCALC 148 (H) 04/20/2017 1418    Other Studies Reviewed Today:  Nuclear Myovue Stress 04/08/2017  There was no ST segment deviation noted during stress.  Defect 1: There is a large defect of moderate severity present in the apical anterior and apex location. There is significant reverse redistribution artifact.  Findings consistent with prior myocardial infarction.  This is an intermediate risk study.  Nuclear stress EF: 39%.   Echo 06/02/2017 Left ventricle: The cavity size was normal. Wall thickness was  increased in a pattern of mild LVH. Systolic function was mildly  to moderately reduced. The estimated ejection fraction was in  the  range of 40% to 45%. Abnormal global longitudinal strain of  -14.4%. Diffuse hypokinesis. There is severe hypokinesis of the  basalinferior myocardium. Doppler parameters are consistent with  abnormal left ventricular relaxation (grade 1 diastolic  dysfunction).  - Aortic valve: Mildly calcified annulus. Trileaflet. Moderate  calcification involving the noncoronary cusp.  - Mitral valve: Mildly calcified leaflets . There was mild  regurgitation.  - Right atrium: Central venous pressure (est): 3 mm Hg.  - Atrial septum: No defect or patent foramen ovale was identified.  - Tricuspid valve: There was trivial regurgitation.  - Pulmonary arteries: PA peak pressure: 20 mm Hg (S).  - Pericardium, extracardiac: There was no pericardial effusion.   Impressions:   - There has been improvement in LVEF compared to prior study in  August 2018.   Assessment and Plan:  1. Dizziness   2. CAD in native artery   3. Cardiomyopathy, unspecified type ([place])   4. Essential hypertension, benign   5. History of CVA (cerebrovascular accident)   6. Mixed hyperlipidemia   7. Chronic combined systolic and diastolic heart failure ([place])   8. Activity intolerance   9. Suspected sleep apnea   10. Snoring   11. Apnea   12. Daytime sleepiness    1. Dizziness Complaining of dizziness.  Denies any near syncopal or syncopal episodes.  She is wondering if she may have vertigo.  States she has no sensation of palpitations or arrhythmias.  Please get a 14-day ZIO monitor to assess for arrhythmias.   2. CAD in native artery Denies any recent anginal or exertional symptoms.  She is status post BMS to RCA 2004.  Continue Plavix 75 mg.  DC full dose aspirin, start aspirin 81 mg daily.  Continue nitroglycerin sublingual as needed.  Continue Coreg 3.125 mg p.o. twice daily.  3. Cardiomyopathy, unspecified type ([place]) Cardiomyopathy was improved on last echocardiogram from 20% up to 40 to 45% on echo  in April 2019. Please get a repeat echocardiogram to reevaluate LV function, diastolic function and valvular function.  4. History of CVA (cerebrovascular accident) No significant focal neurologic deficits from previous CVAs.  No new CVA or TIA-like symptoms.  Continue aspirin 81 mg daily, continue Plavix 75 mg daily.  5. Essential hypertension, benign BP today 128/74.  Continue amlodipine 10 mg daily.  Continue Coreg 3.125 mg p.o. twice daily.  6. Mixed hyperlipidemia Last lipid panel noted was 2 years ago April 20, 2017.  Total cholesterol 231, HDL 37, triglycerides 301, LDL 148.  Continue atorvastatin 80 mg daily.  7. Chronic combined systolic and diastolic heart failure ([place]) Patient denies any significant weight gain, lower extremity edema, orthopnea, PND or fatigue.  Her EF had significantly increased to 40-45% on echo on 2019 versus a previous EF of 20.  Complaining of some recent dizziness.  He does complain of increased fatigue.  Continue Coreg 3.125 mg p.o. twice daily.  Continue Lasix 20 mg p.o. twice daily.  Please get a follow-up echocardiogram to reassess LV function, diastolic function, valvular function.  8.  Snoring, periods of apnea, excessive daytime sleepiness. States she has issues with significant snoring, periods of apnea, waking up gasping for breath, excessive daytime sleepiness.  Also states she has trouble going to sleep and staying asleep.  Please refer to [facility] neurology associates for suspected sleep apnea  Medication Adjustments/Labs and Tests Ordered: Current medicines are reviewed at length with the patient today.  Concerns regarding medicines are outlined above.   Disposition: Follow-up with Dr Dr. Harl Bowie or APP 6 to 8 weeks.  Signed, Levell July, NP 06/19/2020 2:48 PM    [facility] at [facility] [place], [place],  [address] Phone: [phone]; Fax: [phone]

## 2020-06-19 ENCOUNTER — Ambulatory Visit (INDEPENDENT_AMBULATORY_CARE_PROVIDER_SITE_OTHER): Payer: Medicare Other | Admitting: Family Medicine

## 2020-06-19 ENCOUNTER — Encounter: Payer: Self-pay | Admitting: Family Medicine

## 2020-06-19 ENCOUNTER — Other Ambulatory Visit: Payer: Self-pay

## 2020-06-19 VITALS — BP 128/74 | HR 70 | Ht 61.0 in | Wt 193.0 lb

## 2020-06-19 DIAGNOSIS — R400 Somnolence: Secondary | ICD-10-CM

## 2020-06-19 DIAGNOSIS — I10 Essential (primary) hypertension: Secondary | ICD-10-CM

## 2020-06-19 DIAGNOSIS — I429 Cardiomyopathy, unspecified: Secondary | ICD-10-CM | POA: Diagnosis not present

## 2020-06-19 DIAGNOSIS — R0681 Apnea, not elsewhere classified: Secondary | ICD-10-CM

## 2020-06-19 DIAGNOSIS — R42 Dizziness and giddiness: Secondary | ICD-10-CM | POA: Diagnosis not present

## 2020-06-19 DIAGNOSIS — R6889 Other general symptoms and signs: Secondary | ICD-10-CM

## 2020-06-19 DIAGNOSIS — R0683 Snoring: Secondary | ICD-10-CM

## 2020-06-19 DIAGNOSIS — I2510 Atherosclerotic heart disease of native coronary artery without angina pectoris: Secondary | ICD-10-CM

## 2020-06-19 DIAGNOSIS — R29818 Other symptoms and signs involving the nervous system: Secondary | ICD-10-CM

## 2020-06-19 DIAGNOSIS — I5042 Chronic combined systolic (congestive) and diastolic (congestive) heart failure: Secondary | ICD-10-CM

## 2020-06-19 DIAGNOSIS — Z8673 Personal history of transient ischemic attack (TIA), and cerebral infarction without residual deficits: Secondary | ICD-10-CM

## 2020-06-19 DIAGNOSIS — E782 Mixed hyperlipidemia: Secondary | ICD-10-CM

## 2020-06-19 NOTE — Patient Instructions (Addendum)
Medication Instructions:  Continue all current medications.  Labwork: none  Testing/Procedures:  Your physician has requested that you have an echocardiogram. Echocardiography is a painless test that uses sound waves to create images of your heart. It provides your doctor with information about the size and shape of your heart and how well your heart's chambers and valves are working. This procedure takes approximately one hour. There are no restrictions for this procedure.  Your physician has recommended that you wear a 14 day event monitor. Event monitors are medical devices that record the heart's electrical activity. Doctors most often [place] these monitors to diagnose arrhythmias. Arrhythmias are problems with the speed or rhythm of the heartbeat. The monitor is a small, portable device. You can wear one while you do your normal daily activities. This is usually used to diagnose what is causing palpitations/syncope (passing out).  Office will contact with results via phone or letter.    Follow-Up: 6 - 8 weeks   Any Other Special Instructions Will Be Listed Below (If Applicable). You have been referred to:  [facility]   If you need a refill on your cardiac medications before your next appointment, please call your pharmacy.

## 2020-06-24 ENCOUNTER — Ambulatory Visit: Payer: Medicare Other

## 2020-06-24 ENCOUNTER — Other Ambulatory Visit: Payer: Self-pay | Admitting: Cardiology

## 2020-06-24 DIAGNOSIS — R42 Dizziness and giddiness: Secondary | ICD-10-CM

## 2020-06-25 ENCOUNTER — Other Ambulatory Visit: Payer: Self-pay | Admitting: Nurse Practitioner

## 2020-07-28 ENCOUNTER — Other Ambulatory Visit: Payer: Self-pay | Admitting: Nurse Practitioner

## 2020-07-30 ENCOUNTER — Ambulatory Visit (INDEPENDENT_AMBULATORY_CARE_PROVIDER_SITE_OTHER): Payer: Medicare Other

## 2020-07-30 ENCOUNTER — Other Ambulatory Visit: Payer: Self-pay | Admitting: Nurse Practitioner

## 2020-07-30 DIAGNOSIS — I5042 Chronic combined systolic (congestive) and diastolic (congestive) heart failure: Secondary | ICD-10-CM | POA: Diagnosis not present

## 2020-07-30 DIAGNOSIS — R6889 Other general symptoms and signs: Secondary | ICD-10-CM

## 2020-07-30 DIAGNOSIS — R42 Dizziness and giddiness: Secondary | ICD-10-CM

## 2020-07-30 LAB — ECHOCARDIOGRAM COMPLETE
Area-P 1/2: 2.17 cm2
Calc EF: 53.8 %
S' Lateral: 3.18 cm
Single Plane A2C EF: 50.7 %
Single Plane A4C EF: 51.4 %

## 2020-07-31 ENCOUNTER — Telehealth: Payer: Self-pay | Admitting: *Deleted

## 2020-07-31 NOTE — Telephone Encounter (Signed)
Laurine Blazer, LPN  2/59/1028  9:02 AM EDT Back to Top     Notified, copy to pcp.

## 2020-07-31 NOTE — Telephone Encounter (Signed)
-----   Message from Verta Ellen., NP sent at 07/31/2020  8:08 AM EDT ----- Please call the patient and let her know the echocardiogram showed she has good pumping function of the heart.  The main pumping chamber is a little muscular and stiff.  The best management is to maintain blood pressure at or below 130s over 80s and manage all other risk factors..  She has a very mildly leaking mitral valve.  This is not clinically significant.  Her pumping function has actually improved since the prior study.  On prior study her pumping function was 40 to 45%.  Current pumping function is 55 to 60% which is normal.

## 2020-08-19 ENCOUNTER — Ambulatory Visit: Payer: Medicare Other | Admitting: Nurse Practitioner

## 2020-09-01 ENCOUNTER — Ambulatory Visit: Payer: Medicare Other | Admitting: Family Medicine

## 2020-09-02 LAB — COMPREHENSIVE METABOLIC PANEL
ALT: 11 IU/L (ref 0–32)
AST: 12 IU/L (ref 0–40)
Albumin/Globulin Ratio: 1.3 (ref 1.2–2.2)
Albumin: 4.1 g/dL (ref 3.8–4.9)
Alkaline Phosphatase: 125 IU/L — ABNORMAL HIGH (ref 44–121)
BUN/Creatinine Ratio: 13 (ref 9–23)
BUN: 30 mg/dL — ABNORMAL HIGH (ref 6–24)
Bilirubin Total: 0.2 mg/dL (ref 0.0–1.2)
CO2: 24 mmol/L (ref 20–29)
Calcium: 10 mg/dL (ref 8.7–10.2)
Chloride: 100 mmol/L (ref 96–106)
Creatinine, Ser: 2.31 mg/dL — ABNORMAL HIGH (ref 0.57–1.00)
Globulin, Total: 3.2 g/dL (ref 1.5–4.5)
Glucose: 180 mg/dL — ABNORMAL HIGH (ref 65–99)
Potassium: 4.4 mmol/L (ref 3.5–5.2)
Sodium: 140 mmol/L (ref 134–144)
Total Protein: 7.3 g/dL (ref 6.0–8.5)
eGFR: 24 mL/min/{1.73_m2} — ABNORMAL LOW (ref >59)

## 2020-09-02 LAB — TSH: TSH: 9.21 u[IU]/mL — ABNORMAL HIGH (ref 0.450–4.500)

## 2020-09-02 LAB — T4, FREE: Free T4: 0.93 ng/dL (ref 0.82–1.77)

## 2020-09-07 ENCOUNTER — Ambulatory Visit (INDEPENDENT_AMBULATORY_CARE_PROVIDER_SITE_OTHER): Payer: Medicare Other | Admitting: Nurse Practitioner

## 2020-09-07 ENCOUNTER — Encounter: Payer: Self-pay | Admitting: Nurse Practitioner

## 2020-09-07 ENCOUNTER — Other Ambulatory Visit: Payer: Self-pay

## 2020-09-07 VITALS — BP 146/83 | HR 60 | Ht 61.0 in | Wt 199.6 lb

## 2020-09-07 DIAGNOSIS — N184 Chronic kidney disease, stage 4 (severe): Secondary | ICD-10-CM | POA: Diagnosis not present

## 2020-09-07 DIAGNOSIS — E038 Other specified hypothyroidism: Secondary | ICD-10-CM

## 2020-09-07 DIAGNOSIS — E063 Autoimmune thyroiditis: Secondary | ICD-10-CM

## 2020-09-07 DIAGNOSIS — E1122 Type 2 diabetes mellitus with diabetic chronic kidney disease: Secondary | ICD-10-CM

## 2020-09-07 DIAGNOSIS — I10 Essential (primary) hypertension: Secondary | ICD-10-CM

## 2020-09-07 DIAGNOSIS — E559 Vitamin D deficiency, unspecified: Secondary | ICD-10-CM | POA: Diagnosis not present

## 2020-09-07 DIAGNOSIS — E782 Mixed hyperlipidemia: Secondary | ICD-10-CM | POA: Diagnosis not present

## 2020-09-07 LAB — POCT GLYCOSYLATED HEMOGLOBIN (HGB A1C): Hemoglobin A1C: 8.8 % — AB (ref 4.0–5.6)

## 2020-09-07 MED ORDER — LEVOTHYROXINE SODIUM 125 MCG PO TABS: 125.0000 ug | ORAL_TABLET | Freq: Every day | ORAL | 1 refills | Status: DC

## 2020-09-07 NOTE — Patient Instructions (Signed)

## 2020-09-07 NOTE — Progress Notes (Signed)
09/07/2020, 3:41 PM        Endocrinology follow-up note                                       She is accompanied by her care coordinator  Mr. Nolon Nations. All issues noted in this document were discussed and addressed.   Subjective:    Patient ID: Monica Carlson, female    DOB: Oct 08, 1965.  Monica Carlson is being seen for follow-up in  management of currently uncontrolled symptomatic diabetes requested by  Dekoninck, Dahlia Bailiff, NP.   Past Medical History:  Diagnosis Date   Cervical cancer ([place])    cryotherapy and colposcopy/[place], [place]; 1991   CHF (congestive heart failure) ([place]) 07-2014   Diabetes mellitus without complication ([place]) 2633   High cholesterol    Hypertension    MI (myocardial infarction) ([place])    2004   Stroke ([place]) 09/2014, 2013   Vaginal Pap smear, abnormal     Past Surgical History:  Procedure Laterality Date   CHOLECYSTECTOMY  1992   CORONARY STENT PLACEMENT  2004   TUBAL LIGATION      Social History   Socioeconomic History   Marital status: Legally Separated    Spouse name: Not on file   Number of children: Not on file   Years of education: Not on file   Highest education level: Not on file  Occupational History   Not on file  Tobacco Use   Smoking status: Never   Smokeless tobacco: Never  Vaping Use   Vaping Use: Never used  Substance and Sexual Activity   Alcohol use: No   Drug use: No   Sexual activity: Yes    Birth control/protection: None, Surgical    Comment: tubal  Other Topics Concern   Not on file  Social History Narrative   Lives alone.    Has some friends.    Neighbors.    Social Determinants of Health   Financial Resource Strain: Not on file  Food Insecurity: Not on file  Transportation Needs: Not on file  Physical Activity: Not on file  Stress: Not on file  Social Connections: Not on file    Family History  Problem Relation Age of Onset    Migraines Mother    Heart disease Father    Hypertension Father    Diabetes Father    Migraines Maternal Grandfather    Heart attack Maternal Grandfather    Lung cancer Paternal Grandmother    Colon cancer Neg Hx    Colon polyps Neg Hx     Outpatient Encounter Medications as of 09/07/2020  Medication Sig   aspirin 325 MG EC tablet Take 325 mg by mouth daily.   amLODipine (NORVASC) 5 MG tablet Take 5 mg by mouth daily.   atorvastatin (LIPITOR) 80 MG tablet TAKE ONE TABLET BY MOUTH DAILY   Blood Glucose Monitoring Suppl (ACCU-CHEK GUIDE ME) w/Device KIT 1 Piece by Does not apply route as directed.   carvedilol (COREG) 3.125 MG tablet Take 1 tablet (3.125 mg total) by mouth 2 (two) times daily  with a meal.   clopidogrel (PLAVIX) 75 MG tablet TAKE ONE TABLET BY MOUTH EVERY DAY WITH BREAKFAST   diclofenac Sodium (VOLTAREN) 1 % GEL Apply 2 g topically 4 (four) times daily as needed (pain/swelling).   furosemide (LASIX) 20 MG tablet Take 20 mg by mouth 2 (two) times daily.   glipiZIDE (GLUCOTROL) 10 MG tablet TAKE 1 TABLET BY MOUTH TWICE DAILY BEFORE meal   glucose blood (ACCU-CHEK GUIDE) test strip Use as instructed 4 x daily. e11.65   glucose blood test strip 1 each by Other route 2 (two) times daily. Use as instructed bid. E11.65   JANUVIA 50 MG tablet TAKE 1 TABLET BY MOUTH EVERY DAY   Lancets Thin MISC 1 each by Does not apply route 4 (four) times daily. E11.65 Pt has Prodigy meter   levothyroxine (SYNTHROID) 125 MCG tablet Take 1 tablet (125 mcg total) by mouth daily before breakfast.   nitroGLYCERIN (NITROSTAT) 0.4 MG SL tablet Place 1 tablet (0.4 mg total) under the tongue every 5 (five) minutes as needed for chest pain.   pantoprazole (PROTONIX) 40 MG tablet TAKE ONE TABLET BY MOUTH DAILY   rosuvastatin (CRESTOR) 20 MG tablet Take 20 mg by mouth daily.   traZODone (DESYREL) 50 MG tablet Take 50 mg by mouth at bedtime as needed.   Vitamin D, Ergocalciferol, (DRISDOL) 50000 units CAPS  capsule TAKE ONE CAPSULE BY MOUTH ONCE WEEKLY ON MONDAY   [DISCONTINUED] amLODipine (NORVASC) 10 MG tablet TAKE 1 TABLET BY MOUTH DAILY   [DISCONTINUED] aspirin EC 81 MG tablet Take 1 tablet (81 mg total) by mouth daily.   [DISCONTINUED] fenofibrate (TRICOR) 145 MG tablet Take 1 tablet (145 mg total) by mouth daily.   [DISCONTINUED] gabapentin (NEURONTIN) 300 MG capsule TAKE ONE CAPSULE BY MOUTH TWICE DAILY   [DISCONTINUED] levothyroxine (SYNTHROID) 112 MCG tablet Take 1 tablet (112 mcg total) by mouth daily before breakfast.   Facility-Administered Encounter Medications as of 09/07/2020  Medication   glucose blood test strip STRP 1 each    ALLERGIES: Allergies  Allergen Reactions   [place]     Swelling    Other     Teriyaki- swelling    VACCINATION STATUS: Immunization History  Administered Date(s) Administered   Tdap 04/20/2017    Diabetes She presents for her follow-up diabetic visit. She has type 2 diabetes mellitus. Onset time: She was diagnosed at approximate age of 5 years. Her disease course has been stable. Pertinent negatives for hypoglycemia include no confusion, headaches, nervousness/anxiousness, pallor, seizures, sweats or tremors. Associated symptoms include fatigue, polydipsia and polyuria. Pertinent negatives for diabetes include no chest pain and no polyphagia. Pertinent negatives for hypoglycemia complications include no required assistance. Symptoms are stable. Diabetic complications include a CVA, heart disease and nephropathy. Risk factors for coronary artery disease include diabetes mellitus, dyslipidemia, family history, hypertension, sedentary lifestyle, post-menopausal and obesity. Current diabetic treatment includes oral agent (dual therapy). She is compliant with treatment most of the time. Her weight is fluctuating minimally. She is following a generally unhealthy diet. When asked about meal planning, she reported none. She has not had a previous visit with a  dietitian. She never participates in exercise. Her home blood glucose trend is fluctuating minimally. Her breakfast blood glucose range is generally 180-200 mg/dl. Her bedtime blood glucose range is generally 180-200 mg/dl. (She presents today, accompanied by her care attendant, Mr [name], with her logs, no meter showing slightly above target fasting and postprandial glycemic profile.  Her POCT A1c today  is 8.8%, slightly improved from last visit of 9%.  She does not have any hypoglycemia documented.) An ACE inhibitor/angiotensin II receptor blocker is not being taken. She does not see a podiatrist.Eye exam is current.  Hyperlipidemia This is a chronic problem. The current episode started more than 1 year ago. The problem is uncontrolled. Recent lipid tests were reviewed and are high. Exacerbating diseases include chronic renal disease, diabetes, hypothyroidism and obesity. Factors aggravating her hyperlipidemia include beta blockers and fatty foods. Pertinent negatives include no chest pain, myalgias or shortness of breath. Current antihyperlipidemic treatment includes statins. The current treatment provides mild improvement of lipids. Compliance problems include adherence to diet and adherence to exercise.  Risk factors for coronary artery disease include dyslipidemia, diabetes mellitus, hypertension, obesity, family history, a sedentary lifestyle and post-menopausal.  Hypertension This is a chronic problem. The current episode started more than 1 year ago. The problem has been gradually improving since onset. The problem is controlled. Pertinent negatives include no chest pain, headaches, palpitations, shortness of breath or sweats. Agents associated with hypertension include thyroid hormones. Risk factors for coronary artery disease include dyslipidemia, diabetes mellitus, obesity, post-menopausal state and sedentary lifestyle. Past treatments include calcium channel blockers, beta blockers and diuretics.  The current treatment provides mild improvement. Compliance problems include diet and exercise.  Hypertensive end-organ damage includes kidney disease, CAD/MI, CVA and heart failure. Identifiable causes of hypertension include chronic renal disease, renovascular disease and a thyroid problem.    Review of systems  Constitutional: + Minimally fluctuating body weight,  current Body mass index is 37.71 kg/m. , no fatigue, no subjective hyperthermia, no subjective hypothermia Eyes: legally blind, no xerophthalmia ENT: no sore throat, no nodules palpated in throat, no dysphagia/odynophagia, no hoarseness Cardiovascular: no chest pain, no shortness of breath, no palpitations, no leg swelling Respiratory: no cough, no shortness of breath Gastrointestinal: no nausea/vomiting/diarrhea Musculoskeletal: no muscle/joint aches Skin: no rashes, no hyperemia Neurological: no tremors, no numbness, no tingling, no dizziness Psychiatric: no depression, no anxiety  Objective:    BP (!) 146/83   Pulse 60   Ht 5' 1" (1.549 m)   Wt 199 lb 9.6 oz (90.5 kg)   BMI 37.71 kg/m   Wt Readings from Last 3 Encounters:  09/07/20 199 lb 9.6 oz (90.5 kg)  06/19/20 193 lb (87.5 kg)  05/19/20 190 lb (86.2 kg)    BP Readings from Last 3 Encounters:  09/07/20 (!) 146/83  06/19/20 128/74  05/19/20 138/82     Physical Exam- Limited  Constitutional:  Body mass index is 37.71 kg/m. , not in acute distress, normal state of mind Eyes:  EOMI, no exophthalmos Neck: Supple Cardiovascular: RRR, no murmurs, rubs, or gallops, no edema Respiratory: Adequate breathing efforts, no crackles, rales, rhonchi, or wheezing Musculoskeletal: no gross deformities, strength intact in all four extremities, no gross restriction of joint movements Skin:  no rashes, no hyperemia Neurological: no tremor with outstretched hands   CMP     Component Value Date/Time   NA 140 09/01/2020 0943   K 4.4 09/01/2020 0943   CL 100  09/01/2020 0943   CO2 24 09/01/2020 0943   GLUCOSE 180 (H) 09/01/2020 0943   GLUCOSE 57 (L) 05/24/2017 1111   BUN 30 (H) 09/01/2020 0943   CREATININE 2.31 (H) 09/01/2020 0943   CREATININE 2.18 (H) 05/24/2017 1111   CALCIUM 10.0 09/01/2020 0943   PROT 7.3 09/01/2020 0943   ALBUMIN 4.1 09/01/2020 0943   AST 12 09/01/2020 0943  ALT 11 09/01/2020 0943   ALKPHOS 125 (H) 09/01/2020 0943   BILITOT 0.2 09/01/2020 0943   GFRNONAA 26 03/18/2020 0000   GFRNONAA >89 10/23/2015 1008   GFRAA 30 03/18/2020 0000   GFRAA >89 10/23/2015 1008     Diabetic Labs (most recent): Lab Results  Component Value Date   HGBA1C 9.0 (A) 05/19/2020   HGBA1C 9.6 03/18/2020   HGBA1C 7.7 (A) 11/19/2019     Lipid Panel ( most recent) Lipid Panel     Component Value Date/Time   CHOL 290 (A) 03/18/2020 0000   TRIG 706 (A) 03/18/2020 0000   HDL 37 03/18/2020 0000   CHOLHDL 6.2 (H) 04/20/2017 1418   VLDL 20 10/08/2016 0628   LDLCALC 148 (H) 04/20/2017 1418      Lab Results  Component Value Date   TSH 9.210 (H) 09/01/2020   TSH 0.80 03/18/2020   TSH 21.11 (A) 11/15/2019   TSH 5.150 (H) 04/30/2019   TSH 3.08 04/20/2017   TSH 5.98 (H) 01/16/2017   TSH 7.08 (H) 11/15/2016   TSH 17.574 (H) 10/07/2016   TSH 9.64 (H) 10/23/2015   TSH 7.43 (H) 05/22/2015   FREET4 0.93 09/01/2020   FREET4 1.20 04/30/2019      Assessment & Plan:   1) Diabetes mellitus without complication ([place])  - Monica Carlson has currently controlled symptomatic type 2 DM since  55 years of age.  She presents today, accompanied by her care attendant, Mr [name], with her logs, no meter showing slightly above target fasting and postprandial glycemic profile.  Her POCT A1c today is 8.8%, slightly improved from last visit of 9%.  She does not have any hypoglycemia documented.   -She has complications including CKD stage 4, legally blind.    -She is accompanied by her care coordinator, Mr. [name], I had a long discussion with her  about the progressive nature of diabetes and the pathology behind its complications.  -her diabetes is complicated by coronary artery disease, nephropathy , obesity/sedentary life and she remains at a high risk for more acute and chronic complications which include CAD, CVA, CKD, retinopathy, and neuropathy. These are all discussed in detail with her.  - Nutritional counseling repeated at each appointment due to patients tendency to fall back in to old habits.  - The patient admits there is a room for improvement in their diet and drink choices. -  Suggestion is made for the patient to avoid simple carbohydrates from their diet including Cakes, Sweet Desserts / Pastries, Ice Cream, Soda (diet and regular), Sweet Tea, Candies, Chips, Cookies, Sweet Pastries, Store Bought Juices, Alcohol in Excess of 1-2 drinks a day, Artificial Sweeteners, Coffee Creamer, and "Sugar-free" Products. This will help patient to have stable blood glucose profile and potentially avoid unintended weight gain.   - I encouraged the patient to switch to unprocessed or minimally processed complex starch and increased protein intake (animal or plant source), fruits, and vegetables.   - Patient is advised to stick to a routine mealtimes to eat 3 meals a day and avoid unnecessary snacks (to snack only to correct hypoglycemia).  - she will be scheduled with Jearld Fenton, RDN, CDE for individualized diabetes education.  - This patient has significant cognitive deficit, and she has not been willing to start insulin treatment, and still adamantly refuses.  Given her stable glycemic profile, no changes will be made to her medications today because she will not inject insulin.  She is on max dose of Glipizide  and cannot increase Januvia any higher due to kidney disease.   She is advised to continue Januvia 50 mg po daily and Glipizide 10 mg po twice daily with meals.  She is scheduled for follow up with nephrologist soon, I asked Mr  [name] to check with him about her staying on the [place] given her kidney disease.  -She is encouraged to continue monitoring blood glucose at least twice daily, before breakfast and before bed and call the clinic if readings are less than 70 or greater than 300 for 3 tests in a row.   - she is not a candidate for Metformin, SGLT2 inhibitors due to concurrent renal insufficiency.  - Patient specific target  A1c;  LDL, HDL, Triglycerides, and  Waist Circumference were discussed in detail.  2) Blood Pressure /Hypertension: Her blood pressure is not controlled to target.  She is advised to continue Amlodipine 5 mg po daily and Carvedilol 3.125 mg po twice daily.  Will defer med changes to nephrologist.  3) Lipids/Hyperlipidemia:    Her most recent lipid panel from 03/18/20 shows worsening triglycerides of 706.  She is advised to continue Atorvastatin 80 mg po daily at bedtime (? Taking Crestor as well?).  Side effects and precautions discussed with her.  She is advised to avoid fried foods and butter.  She reports an extensive family history of uncontrolled hyperlipidemia.  4)  Weight/Diet: - Her Body mass index is 37.71 kg/m.-   clearly complicating her diabetes care.  I discussed with her the fact that loss of 5 - 10% of her  current body weight will have the most impact on her diabetes management.  CDE Consult will be initiated . Exercise, and detailed carbohydrates information provided  -  detailed on discharge instructions.  5) Hypothyroidism: Her previsit thyroid function tests are consistent with under-replacement.  She is advised to increase her Levothyroxine to 125 mcg po daily before breakfast.     - We discussed about the correct intake of her thyroid hormone, on empty stomach at fasting, with water, separated by at least 30 minutes from breakfast and other medications,  and separated by more than 4 hours from calcium, iron, multivitamins, acid reflux medications (PPIs). -Patient is  made aware of the fact that thyroid hormone replacement is needed for life, dose to be adjusted by periodic monitoring of thyroid function tests.  6) Chronic Care/Health Maintenance: -she is on Statin medications and is encouraged to initiate and continue to follow up with Ophthalmology, Dentist, Podiatrist at least yearly or according to recommendations, and advised to stay away from smoking. I have recommended yearly flu vaccine and pneumonia vaccine at least every 5 years; moderate intensity exercise for up to 150 minutes weekly; and sleep for at least 7 hours a day.  - she is advised to maintain close follow up with Dekoninck, Eustaquio Maize A, NP for primary care needs, as well as her other providers for optimal and coordinated care.    I spent 40 minutes in the care of the patient today including review of labs from [facility], Lipids, Thyroid Function, Hematology (current and previous including abstractions from other facilities); face-to-face time discussing  her blood glucose readings/logs, discussing hypoglycemia and hyperglycemia episodes and symptoms, medications doses, her options of short and long term treatment based on the latest standards of care / guidelines;  discussion about incorporating lifestyle medicine;  and documenting the encounter.    Please refer to Patient Instructions for Blood Glucose Monitoring and Insulin/Medications Dosing Guide"  in media tab for additional information. Please  also refer to " Patient Self Inventory" in the Media  tab for reviewed elements of pertinent patient history.  Monica Carlson participated in the discussions, expressed understanding, and voiced agreement with the above plans.  All questions were answered to her satisfaction. she is encouraged to contact clinic should she have any questions or concerns prior to her return visit.   Follow up plan: - Return in about 3 months (around 12/08/2020) for Diabetes F/U with A1c in office, Thyroid follow up, Previsit  labs, Bring meter and logs.    Rayetta Pigg, [facility] [address] [place], [place] [address] Phone: [phone] Fax: [phone]  09/07/2020, 3:41 PM

## 2020-09-09 ENCOUNTER — Institutional Professional Consult (permissible substitution): Payer: Medicare Other | Admitting: Neurology

## 2020-09-17 ENCOUNTER — Encounter: Payer: Self-pay | Admitting: *Deleted

## 2020-09-24 ENCOUNTER — Telehealth: Payer: Self-pay | Admitting: Family Medicine

## 2020-09-24 NOTE — Telephone Encounter (Signed)
I spoke with patient. She does have the monitor but neither she or her home health provider can apply monitor. She is going to call back when she is back in town to make a nurse visit to apply monitor.    Zio monitor expires 12/02/20

## 2020-09-24 NOTE — Telephone Encounter (Signed)
Patient called wants some to show her or to apply her 14 day Zio patch. She got letter from [place] at [place] office about the [employer]. She has not received the monitor yet. 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 Please call her back [phone].

## 2020-09-27 ENCOUNTER — Other Ambulatory Visit: Payer: Self-pay | Admitting: "Endocrinology

## 2020-10-02 ENCOUNTER — Ambulatory Visit: Payer: Medicare Other | Admitting: Family Medicine

## 2020-10-26 ENCOUNTER — Other Ambulatory Visit: Payer: Self-pay | Admitting: Nurse Practitioner

## 2020-10-27 ENCOUNTER — Ambulatory Visit: Payer: Medicare Other | Admitting: Family Medicine

## 2020-10-27 ENCOUNTER — Institutional Professional Consult (permissible substitution): Payer: Medicare Other | Admitting: Neurology

## 2020-10-28 ENCOUNTER — Ambulatory Visit: Payer: Medicare Other | Admitting: Family Medicine

## 2020-11-03 ENCOUNTER — Ambulatory Visit: Payer: Medicare Other | Admitting: Family Medicine

## 2020-11-04 NOTE — Progress Notes (Signed)
Cardiology Office Note  Date: 11/05/2020   ID: Monica Carlson, DOB 1965-05-27, MRN [id number]  PCP:  Dara Lords, NP  Cardiologist:  None Electrophysiologist:  None   Chief Complaint: Follow-up CAD, cardiomyopathy, chronic combined systolic and diastolic heart failure, HTN, HLD, bilateral CVAs, Type 2 DM  History of Present Illness: Monica Carlson is a 55 y.o. female with a history of CAD (history of bare-metal stent RCA 2004), cardiomyopathy (improved EF via echocardiogram April 2019 with EF of 40 to 45%) (previously 20%), chronic combined systolic and diastolic heart failure (Entresto stopped by nephrology), HTN, HLD, DM, bilateral CVAs.  Last seen by Dr. Bronson Ing on 09/17/2018 for follow-up secondary to cardiomyopathy and CAD.  She denied any chest pain, palpitations, shortness of breath.  She complained of bilateral feet neuropathy was taking gabapentin.  She was seeing endocrinology for her diabetes.  She had been taken off Metformin.  She has had some anemia recently with some iron infusions.  At last visit she was to undergo colonoscopy with GI in [place] on 09/04/2019 to determine possible source of anemia.  At last visit she was told she has been told she snores a lot and a friend recently stayed with her told her she was having periods of apnea.   She follows with Dr. Dorris Fetch endocrinology in [place] for diabetes.  She has significant visual problems likely secondary to diabetic retinopathy.  She has stage IV renal disease with a recent creatinine of 2.48 and GFR 21.  She sees Dr. Theador [place] nephrology.   She was last here with several complaints.  She had been been having fluctuating blood pressures.  Also complained of periods of dizziness.  She has a history of CVA with a left-sided weakness and states she is blind in her left eye.  Also complained of problems sleeping.  She had problems going to sleep, staying asleep.  She was complaining of snoring and periods of apnea and  waking up gasping.  Sometimes when sleeping at night the next day she had excessive daytime sleepiness.  She denies any anginal or exertional symptoms.  Denies any symptoms of palpitations or arrhythmias.  She had been researching online and believed she might have vertigo.  She stated the dizziness occurs when she is sitting.  She did not any dizziness when changing positions.  She denied any CVA or TIA-like symptoms like she has had in the past.  She stated she has had a total of 4 CVAs.  She was using a cane to mobilize.  She was here with her Education officer, museum.  Blood pressure  well controlled at 128/74.  Heart rate is 70.  She has  issues with anemia and has had IV iron infusions in the past.  She is here for follow-up of recent echocardiogram, stress test and ZIO monitor.  She missed her stress test due to having to babysit she states.  She states she was never able to put her monitor on and the monitor is still at her apartment.  She stated she needed instructions on how to place the monitor.  Her echocardiogram showed improvement in her EF.  See report below.  She currently denies any further issues with dizziness other than occasional vertigo-like symptoms. Recent echocardiogram EF 55 to 60% which was back to normal from previous study in 2019 when EF was 40 to 45%.  She had no WMA's.  Previous echo demonstrated diffuse hypokinesis.  She had mild LVH with G1 DD.  Mild  mitral regurgitation.  She denies any further chest pain/chest pressure/chest tightness.  She prefers to defer stress testing for now.  She was also referred to [facility] neurology clinic.  She prefers not to proceed.  She did not follow-up.  She denies any significant DOE or SOB, orthostatic symptoms, CVA or TIA-like symptoms, palpitations or arrhythmias, PND, orthopnea.  Denies any bleeding issues.  No claudication-like symptoms, DVT or PE-like symptoms, or lower extremity edema.    Past Medical History:  Diagnosis Date   Cervical cancer  ([place])    cryotherapy and [place]pos[place]; 1991   CHF (congestive heart failure) ([place]) 07-2014   Diabetes mellitus without complication ([place]) 2130   High cholesterol    Hypertension    MI (myocardial infarction) ([place])    2004   Stroke [facility]) 09/2014, 2013   Vaginal Pap smear, abnormal     Past Surgical History:  Procedure Laterality Date   CHOLECYSTECTOMY  1992   CORONARY STENT PLACEMENT  2004   TUBAL LIGATION      Current Outpatient Medications  Medication Sig Dispense Refill   amLODipine (NORVASC) 5 MG tablet Take 5 mg by mouth daily.     aspirin 325 MG EC tablet Take 325 mg by mouth daily.     atorvastatin (LIPITOR) 80 MG tablet TAKE ONE TABLET BY MOUTH DAILY 90 tablet 1   Blood Glucose Monitoring Suppl (ACCU-CHEK GUIDE ME) w/Device KIT 1 Piece by Does not apply route as directed. 1 kit 0   carvedilol (COREG) 3.125 MG tablet Take 1 tablet (3.125 mg total) by mouth 2 (two) times daily with a meal. 62 tablet 1   clopidogrel (PLAVIX) 75 MG tablet TAKE ONE TABLET BY MOUTH EVERY DAY WITH BREAKFAST 90 tablet 1   diclofenac Sodium (VOLTAREN) 1 % GEL Apply 2 g topically 4 (four) times daily as needed (pain/swelling). 2 g 0   furosemide (LASIX) 20 MG tablet Take 20 mg by mouth 2 (two) times daily.     glipiZIDE (GLUCOTROL) 10 MG tablet TAKE 1 TABLET BY MOUTH TWICE DAILY BEFORE MEALS 60 tablet 2   glucose blood (ACCU-CHEK GUIDE) test strip Use as instructed 4 x daily. e11.65 150 each 2   glucose blood test strip 1 each by Other route 2 (two) times daily. Use as instructed bid. E11.65 200 each 2   JANUVIA 50 MG tablet TAKE 1 TABLET BY MOUTH EVERY DAY 90 tablet 0   Lancets Thin MISC 1 each by Does not apply route 4 (four) times daily. E11.65 Pt has Prodigy meter 150 each 2   levothyroxine (SYNTHROID) 125 MCG tablet Take 1 tablet (125 mcg total) by mouth daily before breakfast. 90 tablet 1   nitroGLYCERIN (NITROSTAT) 0.4 MG SL tablet Place 1 tablet (0.4 mg total) under the tongue  every 5 (five) minutes as needed for chest pain. 30 tablet 2   pantoprazole (PROTONIX) 40 MG tablet TAKE ONE TABLET BY MOUTH DAILY 90 tablet 1   rosuvastatin (CRESTOR) 20 MG tablet Take 20 mg by mouth daily.     traZODone (DESYREL) 50 MG tablet Take 50 mg by mouth at bedtime as needed.     Vitamin D, Ergocalciferol, (DRISDOL) 50000 units CAPS capsule TAKE ONE CAPSULE BY MOUTH ONCE WEEKLY ON MONDAY  0   Current Facility-Administered Medications  Medication Dose Route Frequency Provider Last Rate Last Admin   glucose blood test strip STRP 1 each  1 each Other BID Dorris Fetch Marella Chimes, MD       Allergies:  Cedar and Other   Social History: The patient  reports that she has never smoked. She has never used smokeless tobacco. She reports that she does not drink alcohol and does not use drugs.   Family History: The patient's family history includes Diabetes in her father; Heart attack in her maternal grandfather; Heart disease in her father; Hypertension in her father; Lung cancer in her paternal grandmother; Migraines in her maternal grandfather and mother.   ROS:  Please see the history of present illness. Otherwise, complete review of systems is positive for none.  All other systems are reviewed and negative.   Physical Exam: VS:  BP 138/80   Pulse 70   Ht '5\' 1"'  (1.549 m)   Wt 199 lb 6.4 oz (90.4 kg)   SpO2 95%   BMI 37.68 kg/m , BMI Body mass index is 37.68 kg/m.  Wt Readings from Last 3 Encounters:  11/05/20 199 lb 6.4 oz (90.4 kg)  09/07/20 199 lb 9.6 oz (90.5 kg)  06/19/20 193 lb (87.5 kg)    General: Patient appears comfortable at rest. Lungs: Clear to auscultation, nonlabored breathing at rest. Cardiac: Regular rate and rhythm, no S3 or significant systolic murmur, no pericardial rub. Extremities: No pitting edema, distal pulses 2+. Skin: Warm and dry. Musculoskeletal: No kyphosis. Neuropsychiatric: Alert and oriented x3, affect grossly appropriate.  ECG: 06/19/2020 EKG  normal sinus rhythm rate of 68 nonspecific T wave abnormality.   Recent Labwork: 09/01/2020: ALT 11; AST 12; BUN 30; Creatinine, Ser 2.31; Potassium 4.4; Sodium 140; TSH 9.210     Component Value Date/Time   CHOL 290 (A) 03/18/2020 0000   TRIG 706 (A) 03/18/2020 0000   HDL 37 03/18/2020 0000   CHOLHDL 6.2 (H) 04/20/2017 1418   VLDL 20 10/08/2016 0628   LDLCALC 148 (H) 04/20/2017 1418    Other Studies Reviewed Today:   Echocardiogram 07/30/2020   1. Left ventricular ejection fraction, by estimation, is 55 to 60%. The  left ventricle has normal function. The left ventricle has no regional  wall motion abnormalities. There is mild left ventricular hypertrophy.  Left ventricular diastolic parameters  are consistent with Grade I diastolic dysfunction (impaired relaxation).  Low normal global longitudinal strain of -15.6%.   2. Right ventricular systolic function is normal. The right ventricular  size is normal. Tricuspid regurgitation signal is inadequate for assessing  PA pressure.   3. The mitral valve is normal in structure. Mild mitral valve  regurgitation.   4. The aortic valve is tricuspid. There is mild calcification of the  aortic valve. Aortic valve regurgitation is not visualized. Mild aortic  valve sclerosis is present, with no evidence of aortic valve stenosis.   5. The inferior vena cava is normal in size with greater than 50%  respiratory variability, suggesting right atrial pressure of 3 mmHg.   Comparison(s): Previous Echo showed LV EF 40-45%, abnormal longitudinal  strain -14.4%, diffuse hypokinesis, grade I diastolic dysfunction, AoV  calcification, mild MR, PASP 20 mmHg.     Nuclear Myovue Stress 04/08/2017 There was no ST segment deviation noted during stress. Defect 1: There is a large defect of moderate severity present in the apical anterior and apex location. There is significant reverse redistribution artifact. Findings consistent with prior myocardial  infarction. This is an intermediate risk study. Nuclear stress EF: 39%.   Echo 06/02/2017 Left ventricle: The cavity size was normal. Wall thickness was    increased in a pattern of mild LVH. Systolic function was mildly  to moderately reduced. The estimated ejection fraction was in the    range of 40% to 45%. Abnormal global longitudinal strain of    -14.4%. Diffuse hypokinesis. There is severe hypokinesis of the    basalinferior myocardium. Doppler parameters are consistent with    abnormal left ventricular relaxation (grade 1 diastolic    dysfunction).  - Aortic valve: Mildly calcified annulus. Trileaflet. Moderate    calcification involving the noncoronary cusp.  - Mitral valve: Mildly calcified leaflets . There was mild    regurgitation.  - Right atrium: Central venous pressure (est): 3 mm Hg.  - Atrial septum: No defect or patent foramen ovale was identified.  - Tricuspid valve: There was trivial regurgitation.  - Pulmonary arteries: PA peak pressure: 20 mm Hg (S).  - Pericardium, extracardiac: There was no pericardial effusion.   Impressions:   - There has been improvement in LVEF compared to prior study in    August 2018.   Assessment and Plan:  1. Dizziness   2. CAD in native artery   3. Cardiomyopathy, unspecified type ([place])   4. History of CVA (cerebrovascular accident)   5. Essential hypertension, benign   6. Mixed hyperlipidemia   7. Chronic combined systolic and diastolic heart failure ([place])   8. Suspected sleep apnea     1. Dizziness Complaining of dizziness.  Denies any near syncopal or syncopal episodes.  She is wondering if she may have vertigo.  States she has no sensation of palpitations or arrhythmias.  Start meclizine 25 mg p.o. 3 times daily as needed for vertigo-like symptoms.  2. CAD in native artery  She is status post BMS to RCA 2004.  Continue Plavix 75 mg.  DC full dose aspirin, start aspirin 81 mg daily.  Continue nitroglycerin sublingual as  needed.  Continue Coreg 3.125 mg p.o. twice daily.  She prefers not to proceed with previously ordered stress test.  She denies any current anginal symptoms.  3. Cardiomyopathy, unspecified type ([place]) Cardiomyopathy was improved on last echocardiogram from 20% up to 40 to 45% on echo in April 2019. Recent echocardiogram EF 55 to 60% which was back to normal from previous study in 2019 when EF was 40 to 45%.  She had no WMA's.  Previous echo demonstrated diffuse hypokinesis.  She had mild LVH with G1 DD.  Mild mitral regurgitation.   4. History of CVA (cerebrovascular accident) No significant focal neurologic deficits from previous CVAs.  No new CVA or TIA-like symptoms.  Continue aspirin 81 mg daily, continue Plavix 75 mg daily.  5. Essential hypertension, benign BP today 138/80 continue amlodipine 10 mg daily.  Continue Coreg 3.125 mg p.o. twice daily.  6. Mixed hyperlipidemia Last lipid panel noted was 2 years ago April 20, 2017.  Total cholesterol 231, HDL 37, triglycerides 301, LDL 148.  Continue atorvastatin 80 mg daily.  7. Chronic combined systolic and diastolic heart failure ([place]) Patient denies any significant weight gain, lower extremity edema, orthopnea, PND or fatigue.  Her EF had significantly increased to 40-45% on echo on 2019 versus a previous EF of 20.  Complaining of some recent dizziness.  He does complain of increased fatigue.  Continue Coreg 3.125 mg p.o. twice daily.  Continue Lasix 20 mg p.o. twice daily.  EF improved on recent echocardiogram.  EF now 55 to 60%.  See results above.  8.  Snoring, periods of apnea, excessive daytime sleepiness. Last visit she was referred to [facility] neurology for sleep evaluation.  She did  not follow-up.  She prefers to defer for now.  Medication Adjustments/Labs and Tests Ordered: Current medicines are reviewed at length with the patient today.  Concerns regarding medicines are outlined above.   Disposition: Follow-up with Dr. Harl Bowie  or APP 6 months  Signed, Levell July, NP 11/05/2020 9:02 AM    [place] [facility]. [place], [place], [place] [address] Phone: [phone]; Fax: [phone]

## 2020-11-05 ENCOUNTER — Ambulatory Visit (INDEPENDENT_AMBULATORY_CARE_PROVIDER_SITE_OTHER): Payer: Medicare Other | Admitting: Family Medicine

## 2020-11-05 ENCOUNTER — Encounter: Payer: Self-pay | Admitting: Family Medicine

## 2020-11-05 VITALS — BP 138/80 | HR 70 | Ht 61.0 in | Wt 199.4 lb

## 2020-11-05 DIAGNOSIS — E782 Mixed hyperlipidemia: Secondary | ICD-10-CM

## 2020-11-05 DIAGNOSIS — Z8673 Personal history of transient ischemic attack (TIA), and cerebral infarction without residual deficits: Secondary | ICD-10-CM

## 2020-11-05 DIAGNOSIS — R29818 Other symptoms and signs involving the nervous system: Secondary | ICD-10-CM

## 2020-11-05 DIAGNOSIS — I2510 Atherosclerotic heart disease of native coronary artery without angina pectoris: Secondary | ICD-10-CM

## 2020-11-05 DIAGNOSIS — R42 Dizziness and giddiness: Secondary | ICD-10-CM | POA: Diagnosis not present

## 2020-11-05 DIAGNOSIS — I429 Cardiomyopathy, unspecified: Secondary | ICD-10-CM

## 2020-11-05 DIAGNOSIS — I5042 Chronic combined systolic (congestive) and diastolic (congestive) heart failure: Secondary | ICD-10-CM

## 2020-11-05 DIAGNOSIS — I10 Essential (primary) hypertension: Secondary | ICD-10-CM

## 2020-11-05 MED ORDER — MECLIZINE HCL 25 MG PO TABS: 25.0000 mg | ORAL_TABLET | Freq: Three times a day (TID) | ORAL | 0 refills | Status: DC | PRN

## 2020-11-05 NOTE — Patient Instructions (Addendum)
Medication Instructions:  Your physician has recommended you make the following change in your medication:  Start meclizine 25 mg every 8 hours as needed for dizziness Continue other medications the same  Labwork: none  Testing/Procedures: none  Follow-Up: Your physician recommends that you schedule a follow-up appointment in: 6 months with your new cardiologist.  Any Other Special Instructions Will Be Listed Below (If Applicable).  If you need a refill on your cardiac medications before your next appointment, please call your pharmacy.

## 2020-12-03 LAB — T4, FREE: Free T4: 0.76 ng/dL — ABNORMAL LOW (ref 0.82–1.77)

## 2020-12-03 LAB — TSH: TSH: 16.1 u[IU]/mL — ABNORMAL HIGH (ref 0.450–4.500)

## 2020-12-09 ENCOUNTER — Encounter: Payer: Self-pay | Admitting: Nurse Practitioner

## 2020-12-09 ENCOUNTER — Ambulatory Visit (INDEPENDENT_AMBULATORY_CARE_PROVIDER_SITE_OTHER): Payer: Medicare Other | Admitting: Nurse Practitioner

## 2020-12-09 ENCOUNTER — Other Ambulatory Visit: Payer: Self-pay

## 2020-12-09 VITALS — BP 102/76 | HR 66 | Ht 61.0 in | Wt 193.0 lb

## 2020-12-09 DIAGNOSIS — E559 Vitamin D deficiency, unspecified: Secondary | ICD-10-CM

## 2020-12-09 DIAGNOSIS — E038 Other specified hypothyroidism: Secondary | ICD-10-CM | POA: Diagnosis not present

## 2020-12-09 DIAGNOSIS — E1122 Type 2 diabetes mellitus with diabetic chronic kidney disease: Secondary | ICD-10-CM

## 2020-12-09 DIAGNOSIS — I10 Essential (primary) hypertension: Secondary | ICD-10-CM

## 2020-12-09 DIAGNOSIS — E782 Mixed hyperlipidemia: Secondary | ICD-10-CM

## 2020-12-09 DIAGNOSIS — N184 Chronic kidney disease, stage 4 (severe): Secondary | ICD-10-CM | POA: Diagnosis not present

## 2020-12-09 DIAGNOSIS — E063 Autoimmune thyroiditis: Secondary | ICD-10-CM

## 2020-12-09 LAB — POCT GLYCOSYLATED HEMOGLOBIN (HGB A1C): HbA1c, POC (controlled diabetic range): 7.3 % — AB (ref 0.0–7.0)

## 2020-12-09 NOTE — Progress Notes (Signed)
12/09/2020, 3:22 PM        Endocrinology follow-up note                                       She is no longer being helped by her care coordinator Mr. [name].  Says she is no longer eligible for his assistance.  She does have nurse aide who helps her during the week.   Subjective:    Patient ID: Monica Carlson, female    DOB: 02/18/1966.  Monica Carlson is being seen for follow-up in  management of currently uncontrolled symptomatic diabetes requested by  Dekoninck, Dahlia Bailiff, NP.   Past Medical History:  Diagnosis Date   Cervical cancer ([place])    cryotherapy and colposcopy/[place]; 1991   CHF (congestive heart failure) ([place]) 07-2014   Diabetes mellitus without complication ([place]) 3552   High cholesterol    Hypertension    MI (myocardial infarction) ([place])    2004   Stroke ([place]) 09/2014, 2013   Vaginal Pap smear, abnormal     Past Surgical History:  Procedure Laterality Date   CHOLECYSTECTOMY  1992   CORONARY STENT PLACEMENT  2004   TUBAL LIGATION      Social History   Socioeconomic History   Marital status: Legally Separated    Spouse name: Not on file   Number of children: Not on file   Years of education: Not on file   Highest education level: Not on file  Occupational History   Not on file  Tobacco Use   Smoking status: Never   Smokeless tobacco: Never  Vaping Use   Vaping Use: Never used  Substance and Sexual Activity   Alcohol use: No   Drug use: No   Sexual activity: Yes    Birth control/protection: None, Surgical    Comment: tubal  Other Topics Concern   Not on file  Social History Narrative   Lives alone.    Has some friends.    Neighbors.    Social Determinants of Health   Financial Resource Strain: Not on file  Food Insecurity: Not on file  Transportation Needs: Not on file  Physical Activity: Not on file  Stress: Not on file  Social Connections: Not on file     Family History  Problem Relation Age of Onset   Migraines Mother    Heart disease Father    Hypertension Father    Diabetes Father    Migraines Maternal Grandfather    Heart attack Maternal Grandfather    Lung cancer Paternal Grandmother    Colon cancer Neg Hx    Colon polyps Neg Hx     Outpatient Encounter Medications as of 12/09/2020  Medication Sig   amLODipine (NORVASC) 5 MG tablet Take 5 mg by mouth daily.   aspirin 325 MG EC tablet Take 325 mg by mouth daily.   atorvastatin (LIPITOR) 80 MG tablet TAKE ONE TABLET BY MOUTH DAILY   Blood Glucose Monitoring Suppl (ACCU-CHEK GUIDE ME) w/Device KIT 1 Piece by Does not apply route as directed.   carvedilol (COREG) 3.125  MG tablet Take 1 tablet (3.125 mg total) by mouth 2 (two) times daily with a meal.   clopidogrel (PLAVIX) 75 MG tablet TAKE ONE TABLET BY MOUTH EVERY DAY WITH BREAKFAST   diclofenac Sodium (VOLTAREN) 1 % GEL Apply 2 g topically 4 (four) times daily as needed (pain/swelling).   furosemide (LASIX) 20 MG tablet Take 20 mg by mouth 2 (two) times daily.   gabapentin (NEURONTIN) 300 MG capsule Take 300 mg by mouth 2 (two) times daily.   glipiZIDE (GLUCOTROL) 10 MG tablet TAKE 1 TABLET BY MOUTH TWICE DAILY BEFORE MEALS   glucose blood (ACCU-CHEK GUIDE) test strip Use as instructed 4 x daily. e11.65   glucose blood test strip 1 each by Other route 2 (two) times daily. Use as instructed bid. E11.65   JANUVIA 50 MG tablet TAKE 1 TABLET BY MOUTH EVERY DAY   Lancets Thin MISC 1 each by Does not apply route 4 (four) times daily. E11.65 Pt has Prodigy meter   levothyroxine (SYNTHROID) 125 MCG tablet Take 1 tablet (125 mcg total) by mouth daily before breakfast.   meclizine (ANTIVERT) 25 MG tablet Take 1 tablet (25 mg total) by mouth 3 (three) times daily as needed for dizziness.   nitroGLYCERIN (NITROSTAT) 0.4 MG SL tablet Place 1 tablet (0.4 mg total) under the tongue every 5 (five) minutes as needed for chest pain.    pantoprazole (PROTONIX) 40 MG tablet TAKE ONE TABLET BY MOUTH DAILY   rosuvastatin (CRESTOR) 20 MG tablet Take 20 mg by mouth daily.   traZODone (DESYREL) 50 MG tablet Take 50 mg by mouth at bedtime as needed.   Vitamin D, Ergocalciferol, (DRISDOL) 50000 units CAPS capsule TAKE ONE CAPSULE BY MOUTH ONCE WEEKLY ON MONDAY   Facility-Administered Encounter Medications as of 12/09/2020  Medication   glucose blood test strip STRP 1 each    ALLERGIES: Allergies  Allergen Reactions   Cedar     Swelling    Other     Teriyaki- swelling    VACCINATION STATUS: Immunization History  Administered Date(s) Administered   Tdap 04/20/2017    Diabetes She presents for her follow-up diabetic visit. She has type 2 diabetes mellitus. Onset time: She was diagnosed at approximate age of 38 years. Her disease course has been improving. Pertinent negatives for hypoglycemia include no confusion, headaches, nervousness/anxiousness, pallor, seizures, sweats or tremors. Associated symptoms include fatigue and weight loss. Pertinent negatives for diabetes include no chest pain, no polydipsia, no polyphagia and no polyuria. There are no hypoglycemic complications. Pertinent negatives for hypoglycemia complications include no required assistance. Symptoms are improving. Diabetic complications include a CVA, heart disease and nephropathy. Risk factors for coronary artery disease include diabetes mellitus, dyslipidemia, family history, hypertension, sedentary lifestyle, post-menopausal and obesity. Current diabetic treatment includes oral agent (dual therapy). She is compliant with treatment most of the time. Her weight is fluctuating minimally. She is following a generally unhealthy diet. When asked about meal planning, she reported none. She has not had a previous visit with a dietitian. She never participates in exercise. (She presents today, no longer accompanied by Mr [name] (does not qualify for his assistance any  longer), with no meter or logs to review.  She says she forgot them at home.  Her POCT A1c today is 7.3%, improving from last visit of 8.8%.  She is adamant about no injectables to treat her diabetes.  She also has CKD stage 4 (seeing her nephrologist tomorrow) limiting her oral medication choices as well.  She denies any hypoglycemia.) An ACE inhibitor/angiotensin II receptor blocker is not being taken. She does not see a podiatrist.Eye exam is current.  Hyperlipidemia This is a chronic problem. The current episode started more than 1 year ago. The problem is uncontrolled. Recent lipid tests were reviewed and are high. Exacerbating diseases include chronic renal disease, diabetes, hypothyroidism and obesity. Factors aggravating her hyperlipidemia include beta blockers and fatty foods. Pertinent negatives include no chest pain, myalgias or shortness of breath. Current antihyperlipidemic treatment includes statins. The current treatment provides mild improvement of lipids. Compliance problems include adherence to diet and adherence to exercise.  Risk factors for coronary artery disease include dyslipidemia, diabetes mellitus, hypertension, obesity, family history, a sedentary lifestyle and post-menopausal.  Hypertension This is a chronic problem. The current episode started more than 1 year ago. The problem has been gradually improving since onset. The problem is controlled. Pertinent negatives include no chest pain, headaches, palpitations, shortness of breath or sweats. Agents associated with hypertension include thyroid hormones. Risk factors for coronary artery disease include dyslipidemia, diabetes mellitus, obesity, post-menopausal state and sedentary lifestyle. Past treatments include calcium channel blockers, beta blockers and diuretics. The current treatment provides mild improvement. Compliance problems include diet and exercise.  Hypertensive end-organ damage includes kidney disease, CAD/MI, CVA and  heart failure. Identifiable causes of hypertension include chronic renal disease, renovascular disease and a thyroid problem.    Review of systems  Constitutional: + Minimally fluctuating body weight,  current Body mass index is 36.47 kg/m. , no fatigue, no subjective hyperthermia, no subjective hypothermia Eyes: legally blind, no xerophthalmia ENT: no sore throat, no nodules palpated in throat, no dysphagia/odynophagia, no hoarseness Cardiovascular: no chest pain, no shortness of breath, no palpitations, no leg swelling Respiratory: no cough, no shortness of breath Gastrointestinal: no nausea/vomiting/diarrhea Musculoskeletal: no muscle/joint aches Skin: no rashes, no hyperemia Neurological: no tremors, no numbness, no tingling, no dizziness Psychiatric: no depression, no anxiety  Objective:    BP 102/76   Pulse 66   Ht _0  (1.549 m)   Wt 193 lb (87.5 kg)   SpO2 95%   BMI 36.47 kg/m   Wt Readings from Last 3 Encounters:  12/09/20 193 lb (87.5 kg)  11/05/20 199 lb 6.4 oz (90.4 kg)  09/07/20 199 lb 9.6 oz (90.5 kg)    BP Readings from Last 3 Encounters:  12/09/20 102/76  11/05/20 138/80  09/07/20 (!) 146/83     Physical Exam- Limited  Constitutional:  Body mass index is 36.47 kg/m. , not in acute distress, normal state of mind Eyes:  EOMI, no exophthalmos Neck: Supple Cardiovascular: RRR, no murmurs, rubs, or gallops, no edema Respiratory: Adequate breathing efforts, no crackles, rales, rhonchi, or wheezing Musculoskeletal: no gross deformities, strength intact in all four extremities, no gross restriction of joint movements Skin:  no rashes, no hyperemia Neurological: no tremor with outstretched hands   CMP     Component Value Date/Time   NA 140 09/01/2020 0943   K 4.4 09/01/2020 0943   CL 100 09/01/2020 0943   CO2 24 09/01/2020 0943   GLUCOSE 180 (H) 09/01/2020 0943   GLUCOSE 57 (L) 05/24/2017 1111   BUN 30 (H) 09/01/2020 0943   CREATININE 2.31 (H)  09/01/2020 0943   CREATININE 2.18 (H) 05/24/2017 1111   CALCIUM 10.0 09/01/2020 0943   PROT 7.3 09/01/2020 0943   ALBUMIN 4.1 09/01/2020 0943   AST 12 09/01/2020 0943   ALT 11 09/01/2020 0943   ALKPHOS 125 (H) 09/01/2020  0943   BILITOT 0.2 09/01/2020 0943   GFRNONAA 26 03/18/2020 0000   GFRNONAA >89 10/23/2015 1008   GFRAA 30 03/18/2020 0000   GFRAA >89 10/23/2015 1008     Diabetic Labs (most recent): Lab Results  Component Value Date   HGBA1C 7.3 (A) 12/09/2020   HGBA1C 8.8 (A) 09/07/2020   HGBA1C 9.0 (A) 05/19/2020     Lipid Panel ( most recent) Lipid Panel     Component Value Date/Time   CHOL 290 (A) 03/18/2020 0000   TRIG 706 (A) 03/18/2020 0000   HDL 37 03/18/2020 0000   CHOLHDL 6.2 (H) 04/20/2017 1418   VLDL 20 10/08/2016 0628   LDLCALC 148 (H) 04/20/2017 1418      Lab Results  Component Value Date   TSH 16.100 (H) 12/02/2020   TSH 9.210 (H) 09/01/2020   TSH 0.80 03/18/2020   TSH 21.11 (A) 11/15/2019   TSH 5.150 (H) 04/30/2019   TSH 3.08 04/20/2017   TSH 5.98 (H) 01/16/2017   TSH 7.08 (H) 11/15/2016   TSH 17.574 (H) 10/07/2016   TSH 9.64 (H) 10/23/2015   FREET4 0.76 (L) 12/02/2020   FREET4 0.93 09/01/2020   FREET4 1.20 04/30/2019      Assessment & Plan:   1) Diabetes mellitus without complication ([place])  - Monica Carlson has currently controlled symptomatic type 2 DM since  55 years of age.  She presents today, no longer accompanied by Mr [name] (does not qualify for his assistance any longer), with no meter or logs to review.  She says she forgot them at home.  Her POCT A1c today is 7.3%, improving from last visit of 8.8%.  She is adamant about no injectables to treat her diabetes.  She also has CKD stage 4 (seeing her nephrologist tomorrow) limiting her oral medication choices as well.  She denies any hypoglycemia.  -She has complications including CKD stage 4, legally blind.    -She is accompanied by her care coordinator, Mr. [name], I had a long  discussion with her about the progressive nature of diabetes and the pathology behind its complications.  -her diabetes is complicated by coronary artery disease, nephropathy , obesity/sedentary life and she remains at a high risk for more acute and chronic complications which include CAD, CVA, CKD, retinopathy, and neuropathy. These are all discussed in detail with her.  - Nutritional counseling repeated at each appointment due to patients tendency to fall back in to old habits.  - The patient admits there is a room for improvement in their diet and drink choices. -  Suggestion is made for the patient to avoid simple carbohydrates from their diet including Cakes, Sweet Desserts / Pastries, Ice Cream, Soda (diet and regular), Sweet Tea, Candies, Chips, Cookies, Sweet Pastries, Store Bought Juices, Alcohol in Excess of 1-2 drinks a day, Artificial Sweeteners, Coffee Creamer, and "Sugar-free" Products. This will help patient to have stable blood glucose profile and potentially avoid unintended weight gain.   - I encouraged the patient to switch to unprocessed or minimally processed complex starch and increased protein intake (animal or plant source), fruits, and vegetables.   - Patient is advised to stick to a routine mealtimes to eat 3 meals a day and avoid unnecessary snacks (to snack only to correct hypoglycemia).  - she will be scheduled with Jearld Fenton, RDN, CDE for individualized diabetes education.  - This patient has significant cognitive deficit, and she has not been willing to start insulin treatment, and still adamantly refuses.    -  Given her stable glycemic profile, no changes will be made to her medications today because she will not inject insulin.  She is on max dose of Glipizide and cannot increase Januvia any higher due to kidney disease.   She is advised to continue Januvia 50 mg po daily (pending nephrology approval) and Glipizide 10 mg po twice daily with meals.  She has  follow up with nephrology tomorrow.  -She is encouraged to continue monitoring blood glucose at least twice daily, before breakfast and before bed and call the clinic if readings are less than 70 or greater than 300 for 3 tests in a row.   - she is not a candidate for Metformin, SGLT2 inhibitors due to concurrent renal insufficiency.  - Patient specific target  A1c;  LDL, HDL, Triglycerides, and  Waist Circumference were discussed in detail.  2) Blood Pressure /Hypertension: Her blood pressure is controlled to target.  She is advised to continue Amlodipine 5 mg po daily and Carvedilol 3.125 mg po twice daily.  Will defer med changes to nephrologist.  3) Lipids/Hyperlipidemia:    Her most recent lipid panel from 03/18/20 shows worsening triglycerides of 706.  She is advised to continue Atorvastatin 80 mg po daily at bedtime (? Taking Crestor as well?).  She does not know what medications she is taking.  Side effects and precautions discussed with her.  She is advised to avoid fried foods and butter.  She reports an extensive family history of uncontrolled hyperlipidemia.  Will repeat lipid panel prior to next visit.  I also asked her to bring in her medications for review.  4)  Weight/Diet: - Her Body mass index is 36.47 kg/m.-   clearly complicating her diabetes care.  I discussed with her the fact that loss of 5 - 10% of her  current body weight will have the most impact on her diabetes management.  CDE Consult will be initiated . Exercise, and detailed carbohydrates information provided  -  detailed on discharge instructions.  5) Hypothyroidism: Her previsit thyroid function tests are consistent with inadequate-replacement.  She admits she has missed several doses.  She is advised to continue her current dose of Levothyroxine at 125 mcg po daily before breakfast.     - We discussed about the correct intake of her thyroid hormone, on empty stomach at fasting, with water, separated by at least 30  minutes from breakfast and other medications,  and separated by more than 4 hours from calcium, iron, multivitamins, acid reflux medications (PPIs). -Patient is made aware of the fact that thyroid hormone replacement is needed for life, dose to be adjusted by periodic monitoring of thyroid function tests.  6) Chronic Care/Health Maintenance: -she is on Statin medications and is encouraged to initiate and continue to follow up with Ophthalmology, Dentist, Podiatrist at least yearly or according to recommendations, and advised to stay away from smoking. I have recommended yearly flu vaccine and pneumonia vaccine at least every 5 years; moderate intensity exercise for up to 150 minutes weekly; and sleep for at least 7 hours a day.  - she is advised to maintain close follow up with Dekoninck, Eustaquio Maize A, NP for primary care needs, as well as her other providers for optimal and coordinated care.      I spent 45 minutes in the care of the patient today including review of labs from [facility], Lipids, Thyroid Function, Hematology (current and previous including abstractions from other facilities); face-to-face time discussing  her blood glucose readings/logs,  discussing hypoglycemia and hyperglycemia episodes and symptoms, medications doses, her options of short and long term treatment based on the latest standards of care / guidelines;  discussion about incorporating lifestyle medicine;  and documenting the encounter.    Please refer to Patient Instructions for Blood Glucose Monitoring and Insulin/Medications Dosing Guide"  in media tab for additional information. Please  also refer to " Patient Self Inventory" in the Media  tab for reviewed elements of pertinent patient history.  Monica Carlson participated in the discussions, expressed understanding, and voiced agreement with the above plans.  All questions were answered to her satisfaction. she is encouraged to contact clinic should she have any questions or  concerns prior to her return visit.   Follow up plan: - Return in about 4 months (around 04/11/2021) for Diabetes F/U with A1c in office, Previsit labs, Bring meter and logs, Thyroid follow up.    Rayetta Pigg, [facility] [address] [place], [place] [address] Phone: [phone] Fax: [phone]  12/09/2020, 3:22 PM

## 2020-12-09 NOTE — Patient Instructions (Signed)

## 2020-12-31 ENCOUNTER — Other Ambulatory Visit: Payer: Self-pay | Admitting: "Endocrinology

## 2021-02-22 ENCOUNTER — Other Ambulatory Visit: Payer: Self-pay | Admitting: Nurse Practitioner

## 2021-03-19 ENCOUNTER — Emergency Department (HOSPITAL_COMMUNITY): Payer: Medicare Other

## 2021-03-19 ENCOUNTER — Inpatient Hospital Stay (HOSPITAL_COMMUNITY)
Admission: EM | Admit: 2021-03-19 | Discharge: 2021-03-24 | DRG: 281 | Disposition: A | Discharge status: Home / Self Care | Payer: Medicare Other | Attending: Internal Medicine | Admitting: Internal Medicine

## 2021-03-19 ENCOUNTER — Encounter (HOSPITAL_COMMUNITY): Payer: Self-pay

## 2021-03-19 ENCOUNTER — Other Ambulatory Visit: Payer: Self-pay

## 2021-03-19 DIAGNOSIS — I10 Essential (primary) hypertension: Secondary | ICD-10-CM | POA: Diagnosis present

## 2021-03-19 DIAGNOSIS — I5020 Unspecified systolic (congestive) heart failure: Secondary | ICD-10-CM | POA: Diagnosis not present

## 2021-03-19 DIAGNOSIS — E1165 Type 2 diabetes mellitus with hyperglycemia: Secondary | ICD-10-CM | POA: Diagnosis present

## 2021-03-19 DIAGNOSIS — E063 Autoimmune thyroiditis: Secondary | ICD-10-CM | POA: Diagnosis not present

## 2021-03-19 DIAGNOSIS — I25110 Atherosclerotic heart disease of native coronary artery with unstable angina pectoris: Secondary | ICD-10-CM | POA: Diagnosis present

## 2021-03-19 DIAGNOSIS — Z7982 Long term (current) use of aspirin: Secondary | ICD-10-CM

## 2021-03-19 DIAGNOSIS — Z6837 Body mass index (BMI) 37.0-37.9, adult: Secondary | ICD-10-CM | POA: Diagnosis not present

## 2021-03-19 DIAGNOSIS — Z8541 Personal history of malignant neoplasm of cervix uteri: Secondary | ICD-10-CM | POA: Diagnosis not present

## 2021-03-19 DIAGNOSIS — N179 Acute kidney failure, unspecified: Secondary | ICD-10-CM

## 2021-03-19 DIAGNOSIS — Z20822 Contact with and (suspected) exposure to covid-19: Secondary | ICD-10-CM | POA: Diagnosis present

## 2021-03-19 DIAGNOSIS — I252 Old myocardial infarction: Secondary | ICD-10-CM

## 2021-03-19 DIAGNOSIS — Z955 Presence of coronary angioplasty implant and graft: Secondary | ICD-10-CM | POA: Diagnosis not present

## 2021-03-19 DIAGNOSIS — E038 Other specified hypothyroidism: Secondary | ICD-10-CM | POA: Diagnosis not present

## 2021-03-19 DIAGNOSIS — E871 Hypo-osmolality and hyponatremia: Secondary | ICD-10-CM | POA: Diagnosis present

## 2021-03-19 DIAGNOSIS — I255 Ischemic cardiomyopathy: Secondary | ICD-10-CM | POA: Diagnosis present

## 2021-03-19 DIAGNOSIS — I132 Hypertensive heart and chronic kidney disease with heart failure and with stage 5 chronic kidney disease, or end stage renal disease: Secondary | ICD-10-CM | POA: Diagnosis present

## 2021-03-19 DIAGNOSIS — E1122 Type 2 diabetes mellitus with diabetic chronic kidney disease: Secondary | ICD-10-CM | POA: Diagnosis present

## 2021-03-19 DIAGNOSIS — I5043 Acute on chronic combined systolic (congestive) and diastolic (congestive) heart failure: Secondary | ICD-10-CM | POA: Diagnosis not present

## 2021-03-19 DIAGNOSIS — Z8616 Personal history of COVID-19: Secondary | ICD-10-CM | POA: Diagnosis not present

## 2021-03-19 DIAGNOSIS — E8721 Acute metabolic acidosis: Secondary | ICD-10-CM | POA: Diagnosis not present

## 2021-03-19 DIAGNOSIS — E039 Hypothyroidism, unspecified: Secondary | ICD-10-CM | POA: Diagnosis present

## 2021-03-19 DIAGNOSIS — Z833 Family history of diabetes mellitus: Secondary | ICD-10-CM | POA: Diagnosis not present

## 2021-03-19 DIAGNOSIS — I2119 ST elevation (STEMI) myocardial infarction involving other coronary artery of inferior wall: Principal | ICD-10-CM | POA: Diagnosis present

## 2021-03-19 DIAGNOSIS — Z8673 Personal history of transient ischemic attack (TIA), and cerebral infarction without residual deficits: Secondary | ICD-10-CM | POA: Diagnosis not present

## 2021-03-19 DIAGNOSIS — I5042 Chronic combined systolic (congestive) and diastolic (congestive) heart failure: Secondary | ICD-10-CM | POA: Diagnosis present

## 2021-03-19 DIAGNOSIS — Z79899 Other long term (current) drug therapy: Secondary | ICD-10-CM | POA: Diagnosis not present

## 2021-03-19 DIAGNOSIS — I2510 Atherosclerotic heart disease of native coronary artery without angina pectoris: Secondary | ICD-10-CM

## 2021-03-19 DIAGNOSIS — E1140 Type 2 diabetes mellitus with diabetic neuropathy, unspecified: Secondary | ICD-10-CM | POA: Diagnosis present

## 2021-03-19 DIAGNOSIS — N184 Chronic kidney disease, stage 4 (severe): Secondary | ICD-10-CM | POA: Diagnosis not present

## 2021-03-19 DIAGNOSIS — I219 Acute myocardial infarction, unspecified: Secondary | ICD-10-CM

## 2021-03-19 DIAGNOSIS — Z7984 Long term (current) use of oral hypoglycemic drugs: Secondary | ICD-10-CM

## 2021-03-19 DIAGNOSIS — I213 ST elevation (STEMI) myocardial infarction of unspecified site: Secondary | ICD-10-CM | POA: Diagnosis not present

## 2021-03-19 DIAGNOSIS — N1832 Chronic kidney disease, stage 3b: Secondary | ICD-10-CM | POA: Diagnosis not present

## 2021-03-19 DIAGNOSIS — Z8249 Family history of ischemic heart disease and other diseases of the circulatory system: Secondary | ICD-10-CM | POA: Diagnosis not present

## 2021-03-19 DIAGNOSIS — Z7401 Bed confinement status: Secondary | ICD-10-CM

## 2021-03-19 DIAGNOSIS — D631 Anemia in chronic kidney disease: Secondary | ICD-10-CM | POA: Diagnosis present

## 2021-03-19 DIAGNOSIS — I214 Non-ST elevation (NSTEMI) myocardial infarction: Secondary | ICD-10-CM | POA: Diagnosis present

## 2021-03-19 DIAGNOSIS — Z7902 Long term (current) use of antithrombotics/antiplatelets: Secondary | ICD-10-CM

## 2021-03-19 DIAGNOSIS — E782 Mixed hyperlipidemia: Secondary | ICD-10-CM | POA: Diagnosis not present

## 2021-03-19 DIAGNOSIS — H5462 Unqualified visual loss, left eye, normal vision right eye: Secondary | ICD-10-CM | POA: Diagnosis present

## 2021-03-19 DIAGNOSIS — N185 Chronic kidney disease, stage 5: Secondary | ICD-10-CM | POA: Diagnosis present

## 2021-03-19 LAB — CBC WITH DIFFERENTIAL/PLATELET
Abs Immature Granulocytes: 0.04 10*3/uL (ref 0.00–0.07)
Basophils Absolute: 0.1 10*3/uL (ref 0.0–0.1)
Basophils Relative: 1 %
Eosinophils Absolute: 0.1 10*3/uL (ref 0.0–0.5)
Eosinophils Relative: 1 %
HCT: 35.2 % — ABNORMAL LOW (ref 36.0–46.0)
Hemoglobin: 11.2 g/dL — ABNORMAL LOW (ref 12.0–15.0)
Immature Granulocytes: 1 %
Lymphocytes Relative: 28 %
Lymphs Abs: 2.3 10*3/uL (ref 0.7–4.0)
MCH: 32.5 pg (ref 26.0–34.0)
MCHC: 31.8 g/dL (ref 30.0–36.0)
MCV: 102 fL — ABNORMAL HIGH (ref 80.0–100.0)
Monocytes Absolute: 0.5 10*3/uL (ref 0.1–1.0)
Monocytes Relative: 6 %
Neutro Abs: 5.2 10*3/uL (ref 1.7–7.7)
Neutrophils Relative %: 63 %
Platelets: 280 10*3/uL (ref 150–400)
RBC: 3.45 MIL/uL — ABNORMAL LOW (ref 3.87–5.11)
RDW: 14.1 % (ref 11.5–15.5)
WBC: 8.2 10*3/uL (ref 4.0–10.5)
nRBC: 0 % (ref 0.0–0.2)

## 2021-03-19 LAB — COMPREHENSIVE METABOLIC PANEL
ALT: 10 U/L (ref 0–44)
AST: 8 U/L — ABNORMAL LOW (ref 15–41)
Albumin: 3.5 g/dL (ref 3.5–5.0)
Alkaline Phosphatase: 81 U/L (ref 38–126)
Anion gap: 9 (ref 5–15)
BUN: 41 mg/dL — ABNORMAL HIGH (ref 6–20)
CO2: 22 mmol/L (ref 22–32)
Calcium: 9 mg/dL (ref 8.9–10.3)
Chloride: 101 mmol/L (ref 98–111)
Creatinine, Ser: 3.35 mg/dL — ABNORMAL HIGH (ref 0.44–1.00)
GFR, Estimated: 15 mL/min — ABNORMAL LOW (ref >60)
Glucose, Bld: 424 mg/dL — ABNORMAL HIGH (ref 70–99)
Potassium: 4.6 mmol/L (ref 3.5–5.1)
Sodium: 132 mmol/L — ABNORMAL LOW (ref 135–145)
Total Bilirubin: <0.1 mg/dL — ABNORMAL LOW (ref 0.3–1.2)
Total Protein: 7 g/dL (ref 6.5–8.1)

## 2021-03-19 LAB — TROPONIN I (HIGH SENSITIVITY)
Troponin I (High Sensitivity): 165 ng/L (ref <18)
Troponin I (High Sensitivity): 316 ng/L (ref <18)
Troponin I (High Sensitivity): 1410 ng/L (ref <18)

## 2021-03-19 LAB — RESP PANEL BY RT-PCR (FLU A&B, COVID) ARPGX2
Influenza A by PCR: NEGATIVE
Influenza B by PCR: NEGATIVE
SARS Coronavirus 2 by RT PCR: NEGATIVE

## 2021-03-19 MED ORDER — HEPARIN (PORCINE) 25000 UT/250ML-% IV SOLN
1050.0000 [IU]/h | INTRAVENOUS | Status: DC
  Administered 2021-03-19: 19:00:00 800 [IU]/h via INTRAVENOUS
  Filled 2021-03-19: qty 250

## 2021-03-19 MED ORDER — HEPARIN BOLUS VIA INFUSION
4000.0000 [IU] | Freq: Once | INTRAVENOUS | Status: AC
  Administered 2021-03-19: 4000 [IU] via INTRAVENOUS

## 2021-03-19 MED ORDER — LACTATED RINGERS IV BOLUS
1000.0000 mL | Freq: Once | INTRAVENOUS | Status: AC
  Administered 2021-03-19: 1000 mL via INTRAVENOUS

## 2021-03-19 NOTE — H&P (Addendum)
History and Physical  Monica Carlson QZR:[id number] DOB: [birth year] DOA: 03/19/2021  Referring physician: Dr Tinnie Gens, ED physician PCP: [facility] [place]  Outpatient Specialists:   Patient Coming From: home  Chief Complaint: chest pain  HPI: Monica Carlson is a 56 y.o. female with a history of right MCA stroke, MI in 2004 with history of stenting of coronary artery, hypertensive cardiomyopathy, diabetes with hyperglycemia with poor control.  Patient seen for burning chest pain that she thought was acid reflux.  As the pain did not go away, she came to the hospital for evaluation.  Patient had a full aspirin and her pain went completely away.  She is currently pain-free.  No exertional component to the patient's pain.  Denies fevers, chills, nausea, vomiting.  No radiation to the pain.  Pain was felt in the substernal area.  This pain was unlike her prior MI.   Emergency Department Course: Creatinine 3.35, which is elevated from a baseline of 2.1.  Initial troponin was 165 which increased to 316 approximately an hour later.  Additionally, her initial she had an ST elevation in lead III and T wave inversions in other leads.  Patient discussed with cardiology, who recommended trending troponins.  After getting the troponins, patient read discussed with cardiology who recommended starting heparin and decided that emergent cath was not indicated in this patient.  Review of Systems:   Pt denies any fevers, chills, nausea, vomiting, diarrhea, constipation, abdominal pain, shortness of breath, dyspnea on exertion, orthopnea, cough, wheezing, palpitations, headache, vision changes, lightheadedness, dizziness, melena, rectal bleeding.  Review of systems are otherwise negative  Past Medical History:  Diagnosis Date   Cervical cancer ([place])    cryotherapy and colposcopy/[place], [place]; 1991   CHF (congestive heart failure) ([place]) 07-2014   Diabetes mellitus without complication ([place])  3545   High cholesterol    Hypertension    MI (myocardial infarction) ([place])    2004   Stroke ([place]) 09/2014, 2013   Vaginal Pap smear, abnormal    Past Surgical History:  Procedure Laterality Date   CHOLECYSTECTOMY  1992   CORONARY STENT PLACEMENT  2004   TUBAL LIGATION     Social History:  reports that she has never smoked. She has never used smokeless tobacco. She reports that she does not drink alcohol and does not use drugs. Patient lives at home  Allergies  Allergen Reactions   Cedar     Swelling    Other     Teriyaki- swelling    Family History  Problem Relation Age of Onset   Migraines Mother    Heart disease Father    Hypertension Father    Diabetes Father    Migraines Maternal Grandfather    Heart attack Maternal Grandfather    Lung cancer Paternal Grandmother    Colon cancer Neg Hx    Colon polyps Neg Hx       Prior to Admission medications   Medication Sig Start Date End Date Taking? Authorizing Provider  amLODipine (NORVASC) 5 MG tablet Take 5 mg by mouth daily. 08/05/20   [provider]  aspirin 325 MG EC tablet Take 325 mg by mouth daily.    [provider]  atorvastatin (LIPITOR) 80 MG tablet TAKE ONE TABLET BY MOUTH DAILY 07/10/17   Caren Macadam, MD  Blood Glucose Monitoring Suppl (ACCU-CHEK GUIDE ME) w/Device KIT 1 Piece by Does not apply route as directed. 07/09/18   Cassandria Anger, MD  carvedilol (COREG) 3.125  MG tablet Take 1 tablet (3.125 mg total) by mouth 2 (two) times daily with a meal. 10/25/17   Caren Macadam, MD  clopidogrel (PLAVIX) 75 MG tablet TAKE ONE TABLET BY MOUTH EVERY DAY WITH BREAKFAST 07/10/17   Caren Macadam, MD  diclofenac Sodium (VOLTAREN) 1 % GEL Apply 2 g topically 4 (four) times daily as needed (pain/swelling). 06/07/19   Petrucelli, Samantha R, PA-C  furosemide (LASIX) 20 MG tablet Take 20 mg by mouth 2 (two) times daily.    [provider]  gabapentin (NEURONTIN) 300 MG capsule Take 300 mg by  mouth 2 (two) times daily. 11/04/20   [provider]  glipiZIDE (GLUCOTROL) 10 MG tablet TAKE 1 TABLET BY MOUTH TWICE DAILY BEFORE MEALS 12/31/20   Rayetta Pigg J, NP  glucose blood (ACCU-CHEK GUIDE) test strip Use as instructed 4 x daily. e11.65 07/10/18   Cassandria Anger, MD  glucose blood test strip 1 each by Other route 2 (two) times daily. Use as instructed bid. E11.65 12/18/18   Cassandria Anger, MD  JANUVIA 50 MG tablet TAKE 1 TABLET BY MOUTH EVERY DAY 02/23/21   Brita Romp, NP  Lancets Thin MISC 1 each by Does not apply route 4 (four) times daily. E11.65 Pt has Prodigy meter 10/02/18   Cassandria Anger, MD  levothyroxine (SYNTHROID) 125 MCG tablet TAKE 1 TABLET BY MOUTH DAILY BEFORE BREAKFAST 02/23/21   Brita Romp, NP  meclizine (ANTIVERT) 25 MG tablet Take 1 tablet (25 mg total) by mouth 3 (three) times daily as needed for dizziness. 11/05/20   Verta Ellen., NP  nitroGLYCERIN (NITROSTAT) 0.4 MG SL tablet Place 1 tablet (0.4 mg total) under the tongue every 5 (five) minutes as needed for chest pain. 08/15/17   Caren Macadam, MD  pantoprazole (PROTONIX) 40 MG tablet TAKE ONE TABLET BY MOUTH DAILY 07/10/17   Caren Macadam, MD  rosuvastatin (CRESTOR) 20 MG tablet Take 20 mg by mouth daily. 08/05/20   [provider]  traZODone (DESYREL) 50 MG tablet Take 50 mg by mouth at bedtime as needed. 08/05/20   [provider]  Vitamin D, Ergocalciferol, (DRISDOL) 50000 units CAPS capsule TAKE ONE CAPSULE BY MOUTH ONCE WEEKLY ON MONDAY 11/08/17   [provider]    Physical Exam: BP (!) 167/96    Pulse 76    Resp 13    Ht '5\' 1"'  (1.549 m)    Wt 82.6 kg    SpO2 97%    BMI 34.39 kg/m   General: Middle-age female. Awake and alert and oriented x3. No acute cardiopulmonary distress.  HEENT: Normocephalic atraumatic.  Right and left ears normal in appearance.  Pupils equal, round, reactive to light. Extraocular muscles are intact. Sclerae  anicteric and noninjected.  Moist mucosal membranes. No mucosal lesions.  Neck: Neck supple without lymphadenopathy. No carotid bruits. No masses palpated.  Cardiovascular: Regular rate with normal S1-S2 sounds. No murmurs, rubs, gallops auscultated. No JVD.  Respiratory: Good respiratory effort with no wheezes, rales, rhonchi. Lungs clear to auscultation bilaterally.  No accessory muscle use. Abdomen: Soft, nontender, nondistended. Active bowel sounds. No masses or hepatosplenomegaly  Skin: No rashes, lesions, or ulcerations.  Dry, warm to touch. 2+ dorsalis pedis and radial pulses. Musculoskeletal: No calf or leg pain. All major joints not erythematous nontender.  No upper or lower joint deformation.  Good ROM.  No contractures  Psychiatric: Intact judgment and insight. Pleasant and cooperative. Neurologic: No focal neurological deficits. Strength is  5/5 and symmetric in upper and lower extremities.  Cranial nerves II through XII are grossly intact.           Labs on Admission: I have personally reviewed following labs and imaging studies  CBC: Recent Labs  Lab 03/19/21 1728  WBC 8.2  NEUTROABS 5.2  HGB 11.2*  HCT 35.2*  MCV 102.0*  PLT 494   Basic Metabolic Panel: Recent Labs  Lab 03/19/21 1728  NA 132*  K 4.6  CL 101  CO2 22  GLUCOSE 424*  BUN 41*  CREATININE 3.35*  CALCIUM 9.0   GFR: Estimated Creatinine Clearance: 18.3 mL/min (A) (by C-G formula based on SCr of 3.35 mg/dL (H)). Liver Function Tests: Recent Labs  Lab 03/19/21 1728  AST 8*  ALT 10  ALKPHOS 81  BILITOT <0.1*  PROT 7.0  ALBUMIN 3.5   No results for input(s): LIPASE, AMYLASE in the last 168 hours. No results for input(s): AMMONIA in the last 168 hours. Coagulation Profile: No results for input(s): INR, PROTIME in the last 168 hours. Cardiac Enzymes: No results for input(s): CKTOTAL, CKMB, CKMBINDEX, TROPONINI in the last 168 hours. BNP (last 3 results) No results for input(s): PROBNP in the  last 8760 hours. HbA1C: No results for input(s): HGBA1C in the last 72 hours. CBG: No results for input(s): GLUCAP in the last 168 hours. Lipid Profile: No results for input(s): CHOL, HDL, LDLCALC, TRIG, CHOLHDL, LDLDIRECT in the last 72 hours. Thyroid Function Tests: No results for input(s): TSH, T4TOTAL, FREET4, T3FREE, THYROIDAB in the last 72 hours. Anemia Panel: No results for input(s): VITAMINB12, FOLATE, FERRITIN, TIBC, IRON, RETICCTPCT in the last 72 hours. Urine analysis:    Component Value Date/Time   COLORURINE YELLOW 06/19/2014 0846   APPEARANCEUR TURBID (A) 06/19/2014 0846   LABSPEC 1.029 06/19/2014 0846   PHURINE 5.5 06/19/2014 0846   GLUCOSEU >1000 (A) 06/19/2014 0846   HGBUR MODERATE (A) 06/19/2014 0846   BILIRUBINUR NEGATIVE 06/19/2014 0846   KETONESUR NEGATIVE 06/19/2014 0846   PROTEINUR 30 (A) 06/19/2014 0846   UROBILINOGEN 0.2 06/19/2014 0846   NITRITE POSITIVE (A) 06/19/2014 0846   LEUKOCYTESUR LARGE (A) 06/19/2014 0846   Sepsis Labs: '@LABRCNTIP' (procalcitonin:4,lacticidven:4) )No results found for this or any previous visit (from the past 240 hour(s)).   Radiological Exams on Admission: DG Chest Portable 1 View  Result Date: 03/19/2021 CLINICAL DATA:  Chest pain EXAM: PORTABLE CHEST 1 VIEW COMPARISON:  Chest radiograph 02/24/2021 FINDINGS: The heart is at the upper limits of normal for size, unchanged. The upper mediastinal contours are stable. There is asymmetric elevation of the right hemidiaphragm, unchanged. There is no focal consolidation or pulmonary edema. There is no pleural effusion or pneumothorax. Previously seen interstitial prominence seen on the study from 02/24/2021 has resolved. There is no acute osseous abnormality. IMPRESSION: 1. Previously seen interstitial prominence on the study from 02/24/2021 has resolved. No radiographic evidence of acute cardiopulmonary process on the current study. 2. Borderline cardiomegaly and asymmetric elevation of  the right hemidiaphragm, unchanged. Electronically Signed   By: Valetta Mole M.D.   On: 03/19/2021 18:09    EKG: Independently reviewed.  Sinus rhythm with ST elevation in lead III with inverted T waves throughout.  Assessment/Plan: Principal Problem:   NSTEMI (non-ST elevated myocardial infarction) ([place]) Active Problems:   Essential hypertension, benign   Stented coronary artery   History of MI (myocardial infarction)   Hypothyroidism   Mixed hyperlipidemia   Uncontrolled type 2 diabetes mellitus with hyperglycemia ([place])  History of right MCA stroke    This patient was discussed with the ED physician, including pertinent vitals, physical exam findings, labs, and imaging.  We also discussed care given by the ED provider.  Acute coronary syndrome with history of NSTEMI.  Coronary artery disease Cardiology has reviewed EKG and does not think that this represents STEMI. The bump in troponin was also discussed with them and they felt that this was reassuring.  Continue to trend troponin Heparin initiated EKG repeated - no significant change. Check lipid panel in the morning Continue Crestor AKI on chronic kidney disease IV fluids Repeat creatinine in the morning Uncontrolled type 2 diabetes with hyperglycemia Start Levemir 0.2 units/kg CBGs before meals and nightly with sliding scale insulin Check hemoglobin A1c Hypothyroidism Continue levothyroxine History of stroke   DVT prophylaxis: On therapeutic heparin drip Consultants: Cardiology Code Status: Full code Family Communication: None Disposition Plan: Pending   Truett Mainland, DO

## 2021-03-19 NOTE — ED Provider Notes (Signed)
[facility] EMERGENCY DEPARTMENT Provider Note  CSN: [id number] Arrival date & time: 03/19/21 1646  Chief Complaint(s) Chest Pain  HPI Monica Carlson is a 56 y.o. female with PMH CHF, T2DM, HLD, HTN, previous CVA, MI status post DES placement in 2004 who presents the emergency department for evaluation of chest pain.  Patient states that the pain began suddenly this morning with a burning sensation in her chest.  She states that the pain would not go away and that is why she called EMS.  She had no associated diaphoresis, nausea, vomiting, shortness of breath or other systemic symptoms.  Patient given 325 of aspirin by EMS and pain resolved here in the emergency department.   Chest Pain  Past Medical History Past Medical History:  Diagnosis Date   Cervical cancer ([place])    cryotherapy and colposcopy/[place], [place]; 1991   CHF (congestive heart failure) ([place]) 07-2014   Diabetes mellitus without complication ([place]) 1655   High cholesterol    Hypertension    MI (myocardial infarction) ([place])    2004   Stroke ([place]) 09/2014, 2013   Vaginal Pap smear, abnormal    Patient Active Problem List   Diagnosis Date Noted   IDA (iron deficiency anemia) 06/25/2019   Vitamin D deficiency 10/17/2018   Diabetes mellitus without complication ([place]) 37/48/2707   Encounter for screening colonoscopy 03/21/2017   Hypertension    High cholesterol    Acute ischemic stroke ([place]) 10/11/2016   Severe left ventricular systolic dysfunction    Acute right MCA stroke ([place]) 10/10/2016   Dysphagia    Acute systolic congestive heart failure (HCC)    Acute on chronic combined systolic and diastolic CHF (congestive heart failure) ([place]) 10/08/2016   Acute on chronic diastolic CHF (congestive heart failure) ([place]) 10/07/2016   Uncontrolled type 2 diabetes mellitus with hyperglycemia ([place]) 10/07/2016   Essential hypertension 10/07/2016   Personal history of noncompliance with medical treatment, presenting hazards to  health 12/26/2015   Hypothyroidism 05/30/2015   Mixed hyperlipidemia 05/30/2015   Uncontrolled type 2 diabetes mellitus with complication 86/75/4492   Coronary artery disease involving native coronary artery of native heart without angina pectoris 05/04/2015   Essential hypertension, benign 05/04/2015   Obesity 05/04/2015   Stented coronary artery 05/04/2015   History of MI (myocardial infarction) 05/04/2015   History of CVA (cerebrovascular accident) 05/04/2015   Hypertensive cardiomyopathy ([place]) 05/04/2015   Home Medication(s) Prior to Admission medications   Medication Sig Start Date End Date Taking? Authorizing Provider  amLODipine (NORVASC) 5 MG tablet Take 5 mg by mouth daily. 08/05/20   [provider]  aspirin 325 MG EC tablet Take 325 mg by mouth daily.    [provider]  atorvastatin (LIPITOR) 80 MG tablet TAKE ONE TABLET BY MOUTH DAILY 07/10/17   Caren Macadam, MD  Blood Glucose Monitoring Suppl (ACCU-CHEK GUIDE ME) w/Device KIT 1 Piece by Does not apply route as directed. 07/09/18   Cassandria Anger, MD  carvedilol (COREG) 3.125 MG tablet Take 1 tablet (3.125 mg total) by mouth 2 (two) times daily with a meal. 10/25/17   Caren Macadam, MD  clopidogrel (PLAVIX) 75 MG tablet TAKE ONE TABLET BY MOUTH EVERY DAY WITH BREAKFAST 07/10/17   Caren Macadam, MD  diclofenac Sodium (VOLTAREN) 1 % GEL Apply 2 g topically 4 (four) times daily as needed (pain/swelling). 06/07/19   Petrucelli, Samantha R, PA-C  furosemide (LASIX) 20 MG tablet Take 20 mg by mouth 2 (two) times daily.  [provider]  gabapentin (NEURONTIN) 300 MG capsule Take 300 mg by mouth 2 (two) times daily. 11/04/20   [provider]  glipiZIDE (GLUCOTROL) 10 MG tablet TAKE 1 TABLET BY MOUTH TWICE DAILY BEFORE MEALS 12/31/20   Rayetta Pigg J, NP  glucose blood (ACCU-CHEK GUIDE) test strip Use as instructed 4 x daily. e11.65 07/10/18   Cassandria Anger, MD  glucose blood test  strip 1 each by Other route 2 (two) times daily. Use as instructed bid. E11.65 12/18/18   Cassandria Anger, MD  JANUVIA 50 MG tablet TAKE 1 TABLET BY MOUTH EVERY DAY 02/23/21   Brita Romp, NP  Lancets Thin MISC 1 each by Does not apply route 4 (four) times daily. E11.65 Pt has Prodigy meter 10/02/18   Cassandria Anger, MD  levothyroxine (SYNTHROID) 125 MCG tablet TAKE 1 TABLET BY MOUTH DAILY BEFORE BREAKFAST 02/23/21   Brita Romp, NP  meclizine (ANTIVERT) 25 MG tablet Take 1 tablet (25 mg total) by mouth 3 (three) times daily as needed for dizziness. 11/05/20   Verta Ellen., NP  nitroGLYCERIN (NITROSTAT) 0.4 MG SL tablet Place 1 tablet (0.4 mg total) under the tongue every 5 (five) minutes as needed for chest pain. 08/15/17   Caren Macadam, MD  pantoprazole (PROTONIX) 40 MG tablet TAKE ONE TABLET BY MOUTH DAILY 07/10/17   Caren Macadam, MD  rosuvastatin (CRESTOR) 20 MG tablet Take 20 mg by mouth daily. 08/05/20   [provider]  traZODone (DESYREL) 50 MG tablet Take 50 mg by mouth at bedtime as needed. 08/05/20   [provider]  Vitamin D, Ergocalciferol, (DRISDOL) 50000 units CAPS capsule TAKE ONE CAPSULE BY MOUTH ONCE WEEKLY ON MONDAY 11/08/17   [provider]                                                                                                                                    Past Surgical History Past Surgical History:  Procedure Laterality Date   CHOLECYSTECTOMY  1992   CORONARY STENT PLACEMENT  2004   TUBAL LIGATION     Family History Family History  Problem Relation Age of Onset   Migraines Mother    Heart disease Father    Hypertension Father    Diabetes Father    Migraines Maternal Grandfather    Heart attack Maternal Grandfather    Lung cancer Paternal Grandmother    Colon cancer Neg Hx    Colon polyps Neg Hx     Social History Social History   Tobacco Use   Smoking status: Never   Smokeless tobacco:  Never  Vaping Use   Vaping Use: Never used  Substance Use Topics   Alcohol use: No   Drug use: No   Allergies Cedar and Other  Review of Systems Review of Systems  Cardiovascular:  Positive for chest pain.   Physical Exam Vital Signs  I have reviewed the triage vital signs Ht '5\' 1"'  (1.549 m)    Wt 82.6 kg    BMI 34.39 kg/m   Physical Exam Vitals and nursing note reviewed.  Constitutional:      General: She is not in acute distress.    Appearance: She is well-developed.  HENT:     Head: Normocephalic and atraumatic.  Eyes:     Conjunctiva/sclera: Conjunctivae normal.  Cardiovascular:     Rate and Rhythm: Normal rate and regular rhythm.     Heart sounds: No murmur heard. Pulmonary:     Effort: Pulmonary effort is normal. No respiratory distress.     Breath sounds: Normal breath sounds.  Abdominal:     Palpations: Abdomen is soft.     Tenderness: There is no abdominal tenderness.  Musculoskeletal:        General: No swelling.     Cervical back: Neck supple.  Skin:    General: Skin is warm and dry.     Capillary Refill: Capillary refill takes less than 2 seconds.  Neurological:     Mental Status: She is alert.  Psychiatric:        Mood and Affect: Mood normal.    ED Results and Treatments Labs (all labs ordered are listed, but only abnormal results are displayed) Labs Reviewed  COMPREHENSIVE METABOLIC PANEL  CBC WITH DIFFERENTIAL/PLATELET  TROPONIN I (HIGH SENSITIVITY)                                                                                                                          Radiology No results found.  Pertinent labs & imaging results that were available during my care of the patient were reviewed by me and considered in my medical decision making (see MDM for details).  Medications Ordered in ED Medications - No data to display                                                                                                                                    Procedures .Critical Care Performed by: Teressa Lower, MD Authorized by: Teressa Lower, MD   Critical care provider statement:    Critical care time (minutes):  30   Critical care was necessary to treat or prevent imminent or life-threatening deterioration of the following conditions:  Cardiac failure   Critical care was time spent personally by me on the following  activities:  Development of treatment plan with patient or surrogate, discussions with consultants, evaluation of patient's response to treatment, examination of patient, ordering and review of laboratory studies, ordering and review of radiographic studies, ordering and performing treatments and interventions, pulse oximetry, re-evaluation of patient's condition and review of old charts  (including critical care time)  Medical Decision Making / ED Course   This patient presents to the ED for concern of Chest pain, this involves an extensive number of treatment options, and is a complaint that carries with it a high risk of complications and morbidity.  The differential diagnosis includes ACS, pneumonia, PE, PTX, gerd  MDM: Patient seen in the emergency department for evaluation of chest pain.  Physical exam is unremarkable.  Initial ECG concerning with apparent ST elevation in lead III with new T wave inversions in the lateral leads.  She has had old T wave inversions in the inferior leads on chart review but the right bundle branch pathology appears to be new.  In the setting of a potential ST elevation MI, cardiology was consulted who agrees that the EKG is concerning but as the patient is symptom-free, we will not activate the Cath Lab and instead opt for trending troponins and likely urgent catheterization instead of emergent catheterization.  Laboratory evaluation revealing a hemoglobin of 11.2, mild hyponatremia 132, new AKI with a creatinine of 3.35, BUN 41.  Fluid resuscitation begun.  Initial troponin 165,  delta troponin 316.  Repeat ECG unchanged.  I reconsulted cardiology who recommends transfer to [facility] for likely urgent catheterization and if the patient endorses any chest pain she will be transferred for emergent catheterization.  Patient then admitted to the hospitalist to help place the patient out of bed and [facility].  Cardiology aware of patient coming to [facility].  Patient started on heparin.   Additional history obtained: -External records from outside source obtained and reviewed including: Chart review including previous notes, labs, imaging, consultation notes   Lab Tests: -I ordered, reviewed, and interpreted labs.   The pertinent results include:   Labs Reviewed  COMPREHENSIVE METABOLIC PANEL  CBC WITH DIFFERENTIAL/PLATELET  TROPONIN I (HIGH SENSITIVITY)      EKG   EKG Interpretation  Date/Time:  Friday March 19 2021 17:08:47 EST Ventricular Rate:  82 PR Interval:  154 QRS Duration: 115 QT Interval:  409 QTC Calculation: 478 R Axis:   36 Text Interpretation: Sinus rhythm new T wave inversions in lateral leads ST elevation in III *Possible acute STEMI* Confirmed by [place] ([id number]) on 03/19/2021 5:33:44 PM         Imaging Studies ordered: I ordered imaging studies including cxr I independently visualized and interpreted imaging. I agree with the radiologist interpretation   Medicines ordered and prescription drug management: No orders of the defined types were placed in this encounter.   -I have reviewed the patients home medicines and have made adjustments as needed  Critical interventions Heparin drip multiple cardiology consultations  Consultations Obtained: I requested consultation with the cardiologist,  and discussed lab and imaging findings as well as pertinent plan - they recommend: Transfer to [facility] for urgent catheterization tomorrow, activation of Cath Lab if chest pain   Cardiac Monitoring: The patient was maintained on  a cardiac monitor.  I personally viewed and interpreted the cardiac monitored which showed an underlying rhythm of: Normal sinus rhythm right bundle branch block  Social Determinants of Health:  Factors impacting patients care include: None  Reevaluation: After the interventions noted above, I reevaluated the patient and found that they have :stayed the same  Co morbidities that complicate the patient evaluation  Past Medical History:  Diagnosis Date   Cervical cancer ([place])    cryotherapy and colposcopy/[place]; 1991   CHF (congestive heart failure) ([place]) 07-2014   Diabetes mellitus without complication ([place]) 6659   High cholesterol    Hypertension    MI (myocardial infarction) ([place])    2004   Stroke ([place]) 09/2014, 2013   Vaginal Pap smear, abnormal       Dispostion: I considered admission for this patient, and due to her uptrending troponins she will be admitted     Final Clinical Impression(s) / ED Diagnoses Final diagnoses:  None     '@PCDICTATION' @    Teressa Lower, MD 03/19/21 [phone]

## 2021-03-19 NOTE — ED Triage Notes (Signed)
Pt presents to ED with complaints of sudden onset of substernal chest pain rated 7/10. Pt BP 190/110. EMS reports EKG was sent to ER Doc at [facility], told not STEMI. Pt remained hypertensive. CBG 543. Pt given ASA 324 mg PTA.

## 2021-03-19 NOTE — Progress Notes (Signed)
ANTICOAGULATION CONSULT NOTE - Initial Consult  Pharmacy Consult for Heparin Indication: chest pain/ACS  Allergies  Allergen Reactions   Cedar     Swelling    Other     Teriyaki- swelling    Patient Measurements: Height: 5\' 1"  (154.9 cm) Weight: 82.6 kg (182 lb) IBW/kg (Calculated) : 47.8 Heparin Dosing Weight: 66.6 kg   Vital Signs: BP: 178/97 (01/27 1800) Pulse Rate: 84 (01/27 1800)  Labs: Recent Labs    03/19/21 1728  HGB 11.2*  HCT 35.2*  PLT 280  CREATININE 3.35*  TROPONINIHS 165*    Estimated Creatinine Clearance: 18.3 mL/min (A) (by C-G formula based on SCr of 3.35 mg/dL (H)).   Medical History: Past Medical History:  Diagnosis Date   Cervical cancer ([place])    cryotherapy and colposcopy/[place]; 1991   CHF (congestive heart failure) ([place]) 07-2014   Diabetes mellitus without complication ([place]) 7948   High cholesterol    Hypertension    MI (myocardial infarction) ([place])    2004   Stroke [facility]) 09/2014, 2013   Vaginal Pap smear, abnormal     Assessment: 56 yo female presented on 03/19/2021 with chest pain. Pharmacy consulted to dose heparin for ACS. No anticoagulation prior to admission. Troponin 165. Hgb 11.2. Plt wnl.   Goal of Therapy:  Heparin level 0.3-0.7 units/ml Monitor platelets by anticoagulation protocol: Yes   Plan:  Heparin 4000 unit bolus x1  Start heparin 800 units/hr  Check 8 hr heparin level  Monitor heparin level, CBC and s/s of bleeding   Cristela Felt, PharmD, BCPS Clinical Pharmacist 03/19/2021 6:57 PM

## 2021-03-20 ENCOUNTER — Inpatient Hospital Stay (HOSPITAL_COMMUNITY): Payer: Medicare Other

## 2021-03-20 DIAGNOSIS — E039 Hypothyroidism, unspecified: Secondary | ICD-10-CM

## 2021-03-20 DIAGNOSIS — E1165 Type 2 diabetes mellitus with hyperglycemia: Secondary | ICD-10-CM | POA: Diagnosis not present

## 2021-03-20 DIAGNOSIS — I5020 Unspecified systolic (congestive) heart failure: Secondary | ICD-10-CM

## 2021-03-20 DIAGNOSIS — I214 Non-ST elevation (NSTEMI) myocardial infarction: Secondary | ICD-10-CM

## 2021-03-20 DIAGNOSIS — I10 Essential (primary) hypertension: Secondary | ICD-10-CM | POA: Diagnosis not present

## 2021-03-20 LAB — BASIC METABOLIC PANEL
Anion gap: 9 (ref 5–15)
BUN: 37 mg/dL — ABNORMAL HIGH (ref 6–20)
CO2: 18 mmol/L — ABNORMAL LOW (ref 22–32)
Calcium: 8.8 mg/dL — ABNORMAL LOW (ref 8.9–10.3)
Chloride: 107 mmol/L (ref 98–111)
Creatinine, Ser: 3.25 mg/dL — ABNORMAL HIGH (ref 0.44–1.00)
GFR, Estimated: 16 mL/min — ABNORMAL LOW (ref >60)
Glucose, Bld: 175 mg/dL — ABNORMAL HIGH (ref 70–99)
Potassium: 4 mmol/L (ref 3.5–5.1)
Sodium: 134 mmol/L — ABNORMAL LOW (ref 135–145)

## 2021-03-20 LAB — GLUCOSE, CAPILLARY
Glucose-Capillary: 157 mg/dL — ABNORMAL HIGH (ref 70–99)
Glucose-Capillary: 140 mg/dL — ABNORMAL HIGH (ref 70–99)
Glucose-Capillary: 168 mg/dL — ABNORMAL HIGH (ref 70–99)
Glucose-Capillary: 151 mg/dL — ABNORMAL HIGH (ref 70–99)
Glucose-Capillary: 184 mg/dL — ABNORMAL HIGH (ref 70–99)

## 2021-03-20 LAB — HEMOGLOBIN A1C
Hgb A1c MFr Bld: 8.2 % — ABNORMAL HIGH (ref 4.8–5.6)
Mean Plasma Glucose: 188.64 mg/dL

## 2021-03-20 LAB — ECHOCARDIOGRAM COMPLETE
AR max vel: 1.66 cm2
AV Area VTI: 1.53 cm2
AV Area mean vel: 1.46 cm2
AV Mean grad: 5.2 mmHg
AV Peak grad: 9.7 mmHg
Ao pk vel: 1.55 m/s
Area-P 1/2: 3.72 cm2
Height: 61 in
MV M vel: 4.79 m/s
MV Peak grad: 91.8 mmHg
S' Lateral: 4.3 cm
Single Plane A4C EF: 36.1 %
Weight: 2964.8 oz

## 2021-03-20 LAB — LIPID PANEL
Cholesterol: 240 mg/dL — ABNORMAL HIGH (ref 0–200)
HDL: 36 mg/dL — ABNORMAL LOW (ref >40)
LDL Cholesterol: 154 mg/dL — ABNORMAL HIGH (ref 0–99)
Total CHOL/HDL Ratio: 6.7 RATIO
Triglycerides: 248 mg/dL — ABNORMAL HIGH (ref <150)
VLDL: 50 mg/dL — ABNORMAL HIGH (ref 0–40)

## 2021-03-20 LAB — HIV ANTIBODY (ROUTINE TESTING W REFLEX): HIV Screen 4th Generation wRfx: NONREACTIVE

## 2021-03-20 LAB — HEPARIN LEVEL (UNFRACTIONATED)
Heparin Unfractionated: 0.15 IU/mL — ABNORMAL LOW (ref 0.30–0.70)
Heparin Unfractionated: 0.22 IU/mL — ABNORMAL LOW (ref 0.30–0.70)
Heparin Unfractionated: <0.1 IU/mL — ABNORMAL LOW (ref 0.30–0.70)

## 2021-03-20 LAB — CBG MONITORING, ED: Glucose-Capillary: 168 mg/dL — ABNORMAL HIGH (ref 70–99)

## 2021-03-20 LAB — TROPONIN I (HIGH SENSITIVITY): Troponin I (High Sensitivity): 2132 ng/L (ref <18)

## 2021-03-20 MED ORDER — ONDANSETRON HCL 4 MG/2ML IJ SOLN: 4.0000 mg | Freq: Four times a day (QID) | INTRAMUSCULAR | Status: DC | PRN

## 2021-03-20 MED ORDER — GABAPENTIN 100 MG PO CAPS
200.0000 mg | ORAL_CAPSULE | Freq: Two times a day (BID) | ORAL | Status: DC
  Administered 2021-03-20 – 2021-03-24 (×9): 200 mg via ORAL
  Filled 2021-03-20 (×9): qty 2

## 2021-03-20 MED ORDER — SODIUM CHLORIDE 0.9 % IV SOLN: INTRAVENOUS | Status: DC

## 2021-03-20 MED ORDER — CARVEDILOL 3.125 MG PO TABS
3.1250 mg | ORAL_TABLET | Freq: Two times a day (BID) | ORAL | Status: DC
  Administered 2021-03-20: 3.125 mg via ORAL
  Filled 2021-03-20: qty 1

## 2021-03-20 MED ORDER — ASPIRIN EC 325 MG PO TBEC
325.0000 mg | DELAYED_RELEASE_TABLET | Freq: Every day | ORAL | Status: DC
  Administered 2021-03-20 – 2021-03-22 (×3): 325 mg via ORAL
  Filled 2021-03-20 (×4): qty 1

## 2021-03-20 MED ORDER — TRAZODONE HCL 50 MG PO TABS: 50.0000 mg | ORAL_TABLET | Freq: Every evening | ORAL | Status: DC | PRN

## 2021-03-20 MED ORDER — INSULIN DETEMIR 100 UNIT/ML ~~LOC~~ SOLN
20.0000 [IU] | Freq: Every day | SUBCUTANEOUS | Status: DC
  Administered 2021-03-20 – 2021-03-23 (×4): 20 [IU] via SUBCUTANEOUS
  Filled 2021-03-20 (×5): qty 0.2

## 2021-03-20 MED ORDER — HEPARIN BOLUS VIA INFUSION
2000.0000 [IU] | Freq: Once | INTRAVENOUS | Status: AC
  Administered 2021-03-20: 2000 [IU] via INTRAVENOUS
  Filled 2021-03-20: qty 2000

## 2021-03-20 MED ORDER — INSULIN ASPART 100 UNIT/ML IJ SOLN: 0.0000 [IU] | Freq: Every day | INTRAMUSCULAR | Status: DC

## 2021-03-20 MED ORDER — CARVEDILOL 6.25 MG PO TABS
6.2500 mg | ORAL_TABLET | Freq: Two times a day (BID) | ORAL | Status: DC
  Administered 2021-03-20 – 2021-03-24 (×8): 6.25 mg via ORAL
  Filled 2021-03-20 (×8): qty 1

## 2021-03-20 MED ORDER — FUROSEMIDE 20 MG PO TABS
20.0000 mg | ORAL_TABLET | Freq: Two times a day (BID) | ORAL | Status: DC
  Administered 2021-03-20: 20 mg via ORAL
  Filled 2021-03-20: qty 1

## 2021-03-20 MED ORDER — CARVEDILOL 3.125 MG PO TABS
3.1250 mg | ORAL_TABLET | Freq: Once | ORAL | Status: AC
  Administered 2021-03-20: 3.125 mg via ORAL
  Filled 2021-03-20: qty 1

## 2021-03-20 MED ORDER — ENSURE ENLIVE PO LIQD
237.0000 mL | Freq: Two times a day (BID) | ORAL | Status: DC
  Administered 2021-03-20 – 2021-03-22 (×5): 237 mL via ORAL

## 2021-03-20 MED ORDER — HEPARIN (PORCINE) 25000 UT/250ML-% IV SOLN
1250.0000 [IU]/h | INTRAVENOUS | Status: DC
  Administered 2021-03-20 (×2): 1200 [IU]/h via INTRAVENOUS
  Administered 2021-03-21 – 2021-03-22 (×2): 1250 [IU]/h via INTRAVENOUS
  Filled 2021-03-20 (×4): qty 250

## 2021-03-20 MED ORDER — AMLODIPINE BESYLATE 5 MG PO TABS
5.0000 mg | ORAL_TABLET | Freq: Every day | ORAL | Status: DC
  Administered 2021-03-20 – 2021-03-24 (×5): 5 mg via ORAL
  Filled 2021-03-20 (×5): qty 1

## 2021-03-20 MED ORDER — ROSUVASTATIN CALCIUM 20 MG PO TABS
20.0000 mg | ORAL_TABLET | Freq: Every day | ORAL | Status: DC
  Administered 2021-03-20: 20 mg via ORAL
  Filled 2021-03-20: qty 1

## 2021-03-20 MED ORDER — INSULIN ASPART 100 UNIT/ML IJ SOLN
0.0000 [IU] | Freq: Three times a day (TID) | INTRAMUSCULAR | Status: DC
  Administered 2021-03-20 (×2): 3 [IU] via SUBCUTANEOUS
  Administered 2021-03-20: 09:00:00 2 [IU] via SUBCUTANEOUS
  Administered 2021-03-21: 12:00:00 3 [IU] via SUBCUTANEOUS
  Administered 2021-03-22: 18:00:00 2 [IU] via SUBCUTANEOUS
  Administered 2021-03-22: 5 [IU] via SUBCUTANEOUS
  Administered 2021-03-23 (×2): 2 [IU] via SUBCUTANEOUS
  Administered 2021-03-24: 3 [IU] via SUBCUTANEOUS

## 2021-03-20 MED ORDER — LEVOTHYROXINE SODIUM 25 MCG PO TABS
125.0000 ug | ORAL_TABLET | Freq: Every day | ORAL | Status: DC
  Administered 2021-03-20 – 2021-03-24 (×5): 125 ug via ORAL
  Filled 2021-03-20 (×5): qty 1

## 2021-03-20 MED ORDER — ATORVASTATIN CALCIUM 80 MG PO TABS
80.0000 mg | ORAL_TABLET | Freq: Every day | ORAL | Status: DC
  Administered 2021-03-20 – 2021-03-23 (×4): 80 mg via ORAL
  Filled 2021-03-20 (×4): qty 1

## 2021-03-20 MED ORDER — ACETAMINOPHEN 325 MG PO TABS
650.0000 mg | ORAL_TABLET | ORAL | Status: DC | PRN
  Administered 2021-03-21 – 2021-03-23 (×3): 650 mg via ORAL
  Filled 2021-03-20 (×3): qty 2

## 2021-03-20 MED ORDER — PANTOPRAZOLE SODIUM 40 MG PO TBEC
40.0000 mg | DELAYED_RELEASE_TABLET | Freq: Every day | ORAL | Status: DC
  Administered 2021-03-20 – 2021-03-24 (×5): 40 mg via ORAL
  Filled 2021-03-20 (×5): qty 1

## 2021-03-20 NOTE — Progress Notes (Signed)
ANTICOAGULATION CONSULT NOTE - Follow Up Consult  Pharmacy Consult for IV heparin Indication: chest pain/ACS  Allergies  Allergen Reactions   Cedar     Swelling    Other     Teriyaki- swelling    Patient Measurements: Height: 5\' 1"  (154.9 cm) Weight: 84.1 kg (185 lb 4.8 oz) IBW/kg (Calculated) : 47.8 Heparin Dosing Weight: 67kg  Vital Signs: Temp: 98.2 F (36.8 C) (01/28 1353) Temp Source: Oral (01/28 1353) BP: 125/77 (01/28 1353) Pulse Rate: 70 (01/28 1353)  Labs: Recent Labs    03/19/21 1728 03/19/21 1846 03/19/21 2249 03/20/21 0255 03/20/21 0446 03/20/21 0610 03/20/21 1403  HGB 11.2*  --   --   --   --   --   --   HCT 35.2*  --   --   --   --   --   --   PLT 280  --   --   --   --   --   --   HEPARINUNFRC  --   --   --   --   --  0.15* 0.22*  CREATININE 3.35*  --   --  3.25*  --   --   --   TROPONINIHS 165* 316* 1,410*  --  2,132*  --   --     Estimated Creatinine Clearance: 19 mL/min (A) (by C-G formula based on SCr of 3.25 mg/dL (H)).  Assessment: 26 YOF presenting with chest pain at [facility]. PMH is significant for CAD, MI s/p stent, HTN, DM, CKD Stage IV. No history of anticoagulation prior to admission.   Heparin level 0.24 and subtherapeutic on check after bolus and rate increase to 1050 units/h. RN reports no IV site issues but has reported that pump keeps turning off. Patient went for several tests today and pump could have been off for an unknown amount of time. Given this, will choose not to bolus and increase rate.   Hgb 11.2 and stable, platelets within normal limits. No signs of bleeding noted.   Goal of Therapy:  Heparin level 0.3-0.7 units/ml Monitor platelets by anticoagulation protocol: Yes   Plan:  Increase IV heparin gtt to 1200 units/h 6h heparin level Daily heparin level, CBC Monitor for signs and symptoms of bleeding  Thank you for involving pharmacy in this patient's care.  Elita Quick, PharmD PGY1 [facility] Pharmacy  Resident 03/20/2021 2:57 PM  **Pharmacist phone directory can be found on [email or web address] listed under [place]**

## 2021-03-20 NOTE — Progress Notes (Signed)
Patient ID: Monica Carlson, female   DOB: [birth year], 56 y.o.   MRN: [id number]  PROGRESS NOTE    Monica Carlson  CLE:[id number] DOB: [birth year] DOA: 03/19/2021 PCP: Gwenlyn Saran, [place]   Brief Narrative:  57 y.o. female with a history of right MCA stroke, coronary artery disease/MI status post stenting, hypertensive cardiomyopathy, diabetes mellitus type 2, chronic kidney disease stage IV presented with chest pain to [facility] on 03/19/2021.  On presentation, creatinine was 3.35, baseline around 2.1-2.3.  Initial troponin was 165, increased to 316 along with EKG changes.  Cardiology recommended to start heparin drip and transferred to [facility] for further evaluation.  Assessment & Plan:   Non-STEMI in a patient with history of CAD and history of stenting -Presented with chest pain, positive troponins and EKG changes -Continue heparin drip.  Continue aspirin, statin and Coreg.  Await cardiology evaluation.  Follow 2D echo. -Currently chest pain-free  Acute kidney injury on chronic kidney disease stage IV -Creatinine 3.35 on presentation, baseline around 2.1-2.3.  Creatinine 3.25 today.  Check renal ultrasound.  Decrease normal saline to 100 cc an hour  Hyperlipidemia -LDL 154, triglycerides 248 and cholesterol 240.  Continue statin  Diabetes mellitus type 2 with hyperglycemia  -A1c 8.2.  Continue CBGs with SSI along with long-acting insulin  Hypertension -Monitor blood pressure.  Continue amlodipine, Coreg  Hypothyroidism -Continue levothyroxine  Obesity -Outpatient follow-up   DVT prophylaxis: Heparin drip Code Status: Full Family Communication: None at bedside Disposition Plan: Status is: Inpatient  Remains inpatient appropriate because: Of severity of illness and need for further cardiac work-up   Consultants: Cardiology  Procedures: Echo pending  Antimicrobials: None   Subjective: Patient seen and examined at bedside.  Denies  any current chest pain.  Denies worsening shortness breath, nausea, vomiting or abdominal pain.  Objective: Vitals:   03/20/21 0236 03/20/21 0238 03/20/21 0638 03/20/21 0821  BP:  (!) 166/92 (!) 151/91 (!) 177/96  Pulse:  72 69 76  Resp:  19 17 16   Temp:  97.6 F (36.4 C) 97.8 F (36.6 C) 97.8 F (36.6 C)  TempSrc:  Oral Oral Axillary  SpO2:  99% 98%   Weight: 84.1 kg     Height: 5\' 1"  (1.549 m)       Intake/Output Summary (Last 24 hours) at 03/20/2021 0959 Last data filed at 03/19/2021 2108 Gross per 24 hour  Intake 1000 ml  Output --  Net 1000 ml   Filed Weights   03/19/21 1659 03/20/21 0236  Weight: 82.6 kg 84.1 kg    Examination:  General exam: Appears calm and comfortable.  Currently on room air. Respiratory system: Bilateral decreased breath sounds at bases Cardiovascular system: S1 & S2 heard, Rate controlled Gastrointestinal system: Abdomen is obese, nondistended, soft and nontender. Normal bowel sounds heard. Extremities: No cyanosis, clubbing; trace lower extremity edema Central nervous system: Alert and oriented. No focal neurological deficits. Moving extremities Skin: No rashes, lesions or ulcers Psychiatry: Judgement and insight appear normal. Mood & affect appropriate.     Data Reviewed: I have personally reviewed following labs and imaging studies  CBC: Recent Labs  Lab 03/19/21 1728  WBC 8.2  NEUTROABS 5.2  HGB 11.2*  HCT 35.2*  MCV 102.0*  PLT 944   Basic Metabolic Panel: Recent Labs  Lab 03/19/21 1728 03/20/21 0255  NA 132* 134*  K 4.6 4.0  CL 101 107  CO2 22 18*  GLUCOSE 424* 175*  BUN 41* 37*  CREATININE 3.35* 3.25*  CALCIUM 9.0 8.8*   GFR: Estimated Creatinine Clearance: 19 mL/min (A) (by C-G formula based on SCr of 3.25 mg/dL (H)). Liver Function Tests: Recent Labs  Lab 03/19/21 1728  AST 8*  ALT 10  ALKPHOS 81  BILITOT <0.1*  PROT 7.0  ALBUMIN 3.5   No results for input(s): LIPASE, AMYLASE in the last 168  hours. No results for input(s): AMMONIA in the last 168 hours. Coagulation Profile: No results for input(s): INR, PROTIME in the last 168 hours. Cardiac Enzymes: No results for input(s): CKTOTAL, CKMB, CKMBINDEX, TROPONINI in the last 168 hours. BNP (last 3 results) No results for input(s): PROBNP in the last 8760 hours. HbA1C: Recent Labs    03/20/21 0255  HGBA1C 8.2*   CBG: Recent Labs  Lab 03/20/21 0149 03/20/21 0244 03/20/21 0819  GLUCAP 168* 157* 140*   Lipid Profile: Recent Labs    03/20/21 0255  CHOL 240*  HDL 36*  LDLCALC 154*  TRIG 248*  CHOLHDL 6.7   Thyroid Function Tests: No results for input(s): TSH, T4TOTAL, FREET4, T3FREE, THYROIDAB in the last 72 hours. Anemia Panel: No results for input(s): VITAMINB12, FOLATE, FERRITIN, TIBC, IRON, RETICCTPCT in the last 72 hours. Sepsis Labs: No results for input(s): PROCALCITON, LATICACIDVEN in the last 168 hours.  Recent Results (from the past 240 hour(s))  Resp Panel by RT-PCR (Flu A&B, Covid) Nasopharyngeal Swab     Status: None   Collection Time: 03/19/21  9:10 PM   Specimen: Nasopharyngeal Swab; Nasopharyngeal(NP) swabs in vial transport medium  Result Value Ref Range Status   SARS Coronavirus 2 by RT PCR NEGATIVE NEGATIVE Final    Comment: (NOTE) SARS-CoV-2 target nucleic acids are NOT DETECTED.  The SARS-CoV-2 RNA is generally detectable in upper respiratory specimens during the acute phase of infection. The lowest concentration of SARS-CoV-2 viral copies this assay can detect is 138 copies/mL. A negative result does not preclude SARS-Cov-2 infection and should not be used as the sole basis for treatment or other patient management decisions. A negative result may occur with  improper specimen collection/handling, submission of specimen other than nasopharyngeal swab, presence of viral mutation(s) within the areas targeted by this assay, and inadequate number of viral copies(<138 copies/mL). A  negative result must be combined with clinical observations, patient history, and epidemiological information. The expected result is Negative.  Fact Sheet for Patients:  EntrepreneurPulse.com.au  Fact Sheet for Healthcare Providers:  IncredibleEmployment.be  This test is no t yet approved or cleared by the [place] FDA and  has been authorized for detection and/or diagnosis of SARS-CoV-2 by FDA under an Emergency Use Authorization (EUA). This EUA will remain  in effect (meaning this test can be used) for the duration of the COVID-19 declaration under Section 564(b)(1) of the Act, 21 U.S.C.section 360bbb-3(b)(1), unless the authorization is terminated  or revoked sooner.       Influenza A by PCR NEGATIVE NEGATIVE Final   Influenza B by PCR NEGATIVE NEGATIVE Final    Comment: (NOTE) The Xpert Xpress SARS-CoV-2/FLU/RSV plus assay is intended as an aid in the diagnosis of influenza from Nasopharyngeal swab specimens and should not be used as a sole basis for treatment. Nasal washings and aspirates are unacceptable for Xpert Xpress SARS-CoV-2/FLU/RSV testing.  Fact Sheet for Patients: EntrepreneurPulse.com.au  Fact Sheet for Healthcare Providers: IncredibleEmployment.be  This test is not yet approved or cleared by the [place] FDA and has been authorized for detection and/or diagnosis of SARS-CoV-2 by FDA  under an Emergency Use Authorization (EUA). This EUA will remain in effect (meaning this test can be used) for the duration of the COVID-19 declaration under Section 564(b)(1) of the Act, 21 U.S.C. section 360bbb-3(b)(1), unless the authorization is terminated or revoked.  Performed at [facility], [address] [place], [place] [address]          Radiology Studies: DG Chest Portable 1 View  Result Date: 03/19/2021 CLINICAL DATA:  Chest pain EXAM: PORTABLE CHEST 1 VIEW COMPARISON:   Chest radiograph 02/24/2021 FINDINGS: The heart is at the upper limits of normal for size, unchanged. The upper mediastinal contours are stable. There is asymmetric elevation of the right hemidiaphragm, unchanged. There is no focal consolidation or pulmonary edema. There is no pleural effusion or pneumothorax. Previously seen interstitial prominence seen on the study from 02/24/2021 has resolved. There is no acute osseous abnormality. IMPRESSION: 1. Previously seen interstitial prominence on the study from 02/24/2021 has resolved. No radiographic evidence of acute cardiopulmonary process on the current study. 2. Borderline cardiomegaly and asymmetric elevation of the right hemidiaphragm, unchanged. Electronically Signed   By: Valetta Mole M.D.   On: 03/19/2021 18:09        Scheduled Meds:  amLODipine  5 mg Oral Daily   aspirin  325 mg Oral Daily   atorvastatin  80 mg Oral QHS   carvedilol  3.125 mg Oral Once   carvedilol  6.25 mg Oral BID WC   feeding supplement  237 mL Oral BID BM   insulin aspart  0-15 Units Subcutaneous TID WC   insulin aspart  0-5 Units Subcutaneous QHS   insulin detemir  20 Units Subcutaneous QHS   levothyroxine  125 mcg Oral QAC breakfast   pantoprazole  40 mg Oral Daily   Continuous Infusions:  sodium chloride 125 mL/hr at 03/20/21 0612   heparin 1,050 Units/hr (03/20/21 0653)          Aline August, MD [facility] 03/20/2021, 9:59 AM

## 2021-03-20 NOTE — Consult Note (Signed)
Cardiology Consultation:   Patient ID: Monica Carlson MRN: [id number]; DOB: [birth year]  Admit date: 03/19/2021 Date of Consult: 03/20/2021  PCP:  Monica Carlson, [place] Providers Cardiologist: Previously Dr. Bronson Carlson, though has continued to follow in the [place] office with Monica Dung, NP   Patient Profile:   Monica Carlson is a 56 y.o. female with a PMH of CAD s/p BMS to RCA in 2004, ischemic cardiomyopathy with recovery of EF, chronic combined CHF, HTN, HLD, DM type II, CVA's, CKD stage IV who is being seen 03/20/2021 for the evaluation of NSTEMI at the request of Monica Carlson.  History of Present Illness:   Monica Carlson presented to Monica Carlson 03/19/2021 with complaints of burning chest pain that started earlier that morning.  She activated EMS for persistent chest pain.  Her initial EKG was concerning for ST abnormalities which was reviewed by on-call cardiology and felt to not represent a STEMI.  Her pain resolved after receiving full dose aspirin.  She commented that her pain was not like her previous MI.  She was last evaluated by cardiology and an outpatient visit with Monica Dung, NP 11/05/2020 at which time she was seen to follow-up on recent cardiac work-up including an echocardiogram, stress test, and Zio patch.  Unfortunately she was only able to complete the echocardiogram which showed improvement in her EF, now 55-60%, up from 40-45% in 2019, otherwise with mild LVH, G1 DD, mild MR.  Her cardiomyopathy dates back to 2004 at the time of her MI with EF as low as 20%.  Her last ischemic evaluation was a nuclear stress test in 2009 which showed evidence of prior MI with a large defect of moderate severity in the apical anterior and apex location.  No medication changes occurred at this visit and she was recommended to follow-up with Monica Carlson in 6 months.  Presented with chest pain. Initial episode last week lasting 1 hour, burning sensation midchest.  Thought was GERD and drank a pepsi, symptoms resolved. Recurrent chest pain yesterday prior to presenting, lasted 1hour and resolved. Has not had any recurrent pain.   Hospital course: Persistently hypertensive, otherwise VSS.  Labs notable for Na 134, creatinine 3.35> 3.25 (up from baseline 2.1-2.3), Hgb 11.2, PLT 280, A1c 8.2, LDL 154, T cholesterol 240, HsTrop 165> 316> 1410> 2132.  Initial EKG showed sinus rhythm, rate 82 bpm, ST-T wave abnormalities predominantly in inferiolateral leads with associated T wave inversions.  Reviewed with on-call STEMI doc who recommended surveillance EKGs and ongoing troponin trend as she was chest pain-free.  Repeat EKG with ongoing but improved ST-T wave abnormalities in inferior leads with ongoing inferiolateral T wave inversions.  CXR without acute findings.  He remained chest pain-free after receiving full dose aspirin.  Given rising troponin levels ED provider at [facility] discussed with on-call cardiology who recommended transfer to [facility].  She was started on a heparin drip for NSTEMI and insulin for poorly controlled diabetes.  Cardiology asked to evaluate for NSTEMI.   Past Medical History:  Diagnosis Date   Cervical cancer ([place])    cryotherapy and colposcopy[place]; 1991   CHF (congestive heart failure) ([place]) 07-2014   Diabetes mellitus without complication ([place]) 7741   High cholesterol    Hypertension    MI (myocardial infarction) ([place])    2004   Stroke [facility]) 09/2014, 2013   Vaginal Pap smear, abnormal     Past Surgical History:  Procedure Laterality Date  CHOLECYSTECTOMY  1992   CORONARY STENT PLACEMENT  2004   TUBAL LIGATION       Home Medications:  Prior to Admission medications   Medication Sig Start Date End Date Taking? Authorizing Provider  acetaminophen (TYLENOL) 500 MG tablet Take 1,000-1,500 mg by mouth every 6 (six) hours as needed for mild pain.   Yes [provider]  amLODipine (NORVASC) 5 MG tablet Take 5 mg  by mouth daily. 08/05/20  Yes [provider]  aspirin 325 MG EC tablet Take 325 mg by mouth daily.   Yes [provider]  carvedilol (COREG) 3.125 MG tablet Take 1 tablet (3.125 mg total) by mouth 2 (two) times daily with a meal. 10/25/17  Yes Hagler, Apolonio Schneiders, MD  clopidogrel (PLAVIX) 75 MG tablet TAKE ONE TABLET BY MOUTH EVERY DAY WITH BREAKFAST 07/10/17  Yes Hagler, Apolonio Schneiders, MD  diclofenac Sodium (VOLTAREN) 1 % GEL Apply 2 g topically 4 (four) times daily as needed (pain/swelling). 06/07/19  Yes Petrucelli, Samantha R, PA-C  gabapentin (NEURONTIN) 300 MG capsule Take 300 mg by mouth 2 (two) times daily. 11/04/20  Yes [provider]  gabapentin (NEURONTIN) 300 MG capsule Take 300 mg by mouth 2 (two) times daily. 10/11/17  Yes [provider]  glipiZIDE (GLUCOTROL) 10 MG tablet TAKE 1 TABLET BY MOUTH TWICE DAILY BEFORE MEALS 12/31/20  Yes Brita Romp, NP  JANUVIA 50 MG tablet TAKE 1 TABLET BY MOUTH EVERY DAY 02/23/21  Yes Brita Romp, NP  levothyroxine (SYNTHROID) 125 MCG tablet TAKE 1 TABLET BY MOUTH DAILY BEFORE BREAKFAST 02/23/21  Yes Brita Romp, NP  meclizine (ANTIVERT) 25 MG tablet Take 1 tablet (25 mg total) by mouth 3 (three) times daily as needed for dizziness. 11/05/20  Yes Verta Ellen., NP  nitroGLYCERIN (NITROSTAT) 0.4 MG SL tablet Place 1 tablet (0.4 mg total) under the tongue every 5 (five) minutes as needed for chest pain. 08/15/17  Yes Hagler, Apolonio Schneiders, MD  pantoprazole (PROTONIX) 40 MG tablet TAKE ONE TABLET BY MOUTH DAILY 07/10/17  Yes Hagler, Apolonio Schneiders, MD  rosuvastatin (CRESTOR) 20 MG tablet Take 20 mg by mouth daily. 08/05/20  Yes [provider]  Vitamin D, Ergocalciferol, (DRISDOL) 50000 units CAPS capsule TAKE ONE CAPSULE BY MOUTH ONCE WEEKLY ON MONDAY 11/08/17  Yes [provider]  albuterol (VENTOLIN HFA) 108 (90 Base) MCG/ACT inhaler Inhale 2 puffs into the lungs every 4 (four) hours as needed for shortness of  breath. 02/27/21   [provider]  atorvastatin (LIPITOR) 80 MG tablet TAKE ONE TABLET BY MOUTH DAILY 07/10/17   Caren Macadam, MD  Blood Glucose Monitoring Suppl (ACCU-CHEK GUIDE ME) w/Device KIT 1 Piece by Does not apply route as directed. 07/09/18   Cassandria Anger, MD  carvedilol (COREG) 6.25 MG tablet Take 6.25 mg by mouth 2 (two) times daily. 02/09/21   [provider]  furosemide (LASIX) 20 MG tablet Take 20 mg by mouth 2 (two) times daily.    [provider]  glucose blood (ACCU-CHEK GUIDE) test strip Use as instructed 4 x daily. e11.65 07/10/18   Cassandria Anger, MD  glucose blood test strip 1 each by Other route 2 (two) times daily. Use as instructed bid. E11.65 12/18/18   Cassandria Anger, MD  Lancets Thin MISC 1 each by Does not apply route 4 (four) times daily. E11.65 Pt has Prodigy meter 10/02/18   Nida, Marella Chimes, MD  potassium chloride SA (KLOR-CON M) 20 MEQ tablet  Take 20 mEq by mouth 2 (two) times daily. 03/03/21   [provider]  traZODone (DESYREL) 50 MG tablet Take 50 mg by mouth at bedtime as needed. 08/05/20   [provider]    Inpatient Medications: Scheduled Meds:  amLODipine  5 mg Oral Daily   aspirin  325 mg Oral Daily   atorvastatin  80 mg Oral QHS   carvedilol  3.125 mg Oral Once   carvedilol  6.25 mg Oral BID WC   feeding supplement  237 mL Oral BID BM   insulin aspart  0-15 Units Subcutaneous TID WC   insulin aspart  0-5 Units Subcutaneous QHS   insulin detemir  20 Units Subcutaneous QHS   levothyroxine  125 mcg Oral QAC breakfast   pantoprazole  40 mg Oral Daily   Continuous Infusions:  sodium chloride 125 mL/hr at 03/20/21 0612   heparin 1,050 Units/hr (03/20/21 0653)   PRN Meds: acetaminophen, ondansetron (ZOFRAN) IV, traZODone  Allergies:    Allergies  Allergen Reactions   Cedar     Swelling    Other     Teriyaki- swelling    Social History:   Social History   Socioeconomic  History   Marital status: Legally Separated    Spouse name: Not on file   Number of children: Not on file   Years of education: Not on file   Highest education level: Not on file  Occupational History   Not on file  Tobacco Use   Smoking status: Never   Smokeless tobacco: Never  Vaping Use   Vaping Use: Never used  Substance and Sexual Activity   Alcohol use: No   Drug use: No   Sexual activity: Yes    Birth control/protection: None, Surgical    Comment: tubal  Other Topics Concern   Not on file  Social History Narrative   Lives alone.    Has some friends.    Neighbors.    Social Determinants of Health   Financial Resource Strain: Not on file  Food Insecurity: Not on file  Transportation Needs: Not on file  Physical Activity: Not on file  Stress: Not on file  Social Connections: Not on file  Intimate Partner Violence: Not on file    Family History:    Family History  Problem Relation Age of Onset   Migraines Mother    Heart disease Father    Hypertension Father    Diabetes Father    Migraines Maternal Grandfather    Heart attack Maternal Grandfather    Lung cancer Paternal Grandmother    Colon cancer Neg Hx    Colon polyps Neg Hx      ROS:  Please see the history of present illness.   All other ROS reviewed and negative.     Physical Exam/Data:   Vitals:   03/20/21 0236 03/20/21 0238 03/20/21 0638 03/20/21 0821  BP:  (!) 166/92 (!) 151/91 (!) 177/96  Pulse:  72 69 76  Resp:  '19 17 16  ' Temp:  97.6 F (36.4 C) 97.8 F (36.6 C) 97.8 F (36.6 C)  TempSrc:  Oral Oral Axillary  SpO2:  99% 98%   Weight: 84.1 kg     Height: '5\' 1"'  (1.549 m)       Intake/Output Summary (Last 24 hours) at 03/20/2021 0931 Last data filed at 03/19/2021 2108 Gross per 24 hour  Intake 1000 ml  Output --  Net 1000 ml   Last 3 Weights 03/20/2021 03/19/2021 12/09/2020  Weight (lbs) 185 lb 4.8 oz 182 lb 193 lb  Weight (kg) 84.052 kg 82.555 kg 87.544 kg     Body mass index  is 35.01 kg/m.  General:  Well nourished, well developed, in no acute distress HEENT: normal Neck: no JVD Vascular: No carotid bruits; Distal pulses 2+ bilaterally Cardiac:  normal S1, S2; RRR; no murmur  Lungs:  clear to auscultation bilaterally, no wheezing, rhonchi or rales  Abd: soft, nontender, no hepatomegaly  Ext: no edema Musculoskeletal:  No deformities, BUE and BLE strength normal and equal Skin: warm and dry  Neuro:  CNs 2-12 intact, no focal abnormalities noted Psych:  Normal affect   EKG:  The EKG was personally reviewed and demonstrates:  Initial EKG showed sinus rhythm, rate 82 bpm, ST-T wave abnormalities predominantly in inferiolateral leads with associated T wave inversions.  Reviewed with on-call STEMI doc who recommended surveillance EKGs and ongoing troponin trend as she was chest pain-free.  Repeat EKG with ongoing but improved ST-T wave abnormalities in inferior leads with ongoing inferiolateral T wave inversions.  Telemetry:  Telemetry was personally reviewed and demonstrates:    Relevant CV Studies: Echocardiogram 07/30/2020   1. Left ventricular ejection fraction, by estimation, is 55 to 60%. The  left ventricle has normal function. The left ventricle has no regional  wall motion abnormalities. There is mild left ventricular hypertrophy.  Left ventricular diastolic parameters  are consistent with Grade I diastolic dysfunction (impaired relaxation).  Low normal global longitudinal strain of -15.6%.   2. Right ventricular systolic function is normal. The right ventricular  size is normal. Tricuspid regurgitation signal is inadequate for assessing  PA pressure.   3. The mitral valve is normal in structure. Mild mitral valve  regurgitation.   4. The aortic valve is tricuspid. There is mild calcification of the  aortic valve. Aortic valve regurgitation is not visualized. Mild aortic  valve sclerosis is present, with no evidence of aortic valve stenosis.   5. The  inferior vena cava is normal in size with greater than 50%  respiratory variability, suggesting right atrial pressure of 3 mmHg.   Comparison(s): Previous Echo showed LV EF 40-45%, abnormal longitudinal  strain -14.4%, diffuse hypokinesis, grade I diastolic dysfunction, AoV  calcification, mild MR, PASP 20 mmHg.        Nuclear Myovue Stress 04/08/2017 There was no ST segment deviation noted during stress. Defect 1: There is a large defect of moderate severity present in the apical anterior and apex location. There is significant reverse redistribution artifact. Findings consistent with prior myocardial infarction. This is an intermediate risk study. Nuclear stress EF: 39%.     Echo 06/02/2017 Left ventricle: The cavity size was normal. Wall thickness was    increased in a pattern of mild LVH. Systolic function was mildly    to moderately reduced. The estimated ejection fraction was in the    range of 40% to 45%. Abnormal global longitudinal strain of    -14.4%. Diffuse hypokinesis. There is severe hypokinesis of the    basalinferior myocardium. Doppler parameters are consistent with    abnormal left ventricular relaxation (grade 1 diastolic    dysfunction).  - Aortic valve: Mildly calcified annulus. Trileaflet. Moderate    calcification involving the noncoronary cusp.  - Mitral valve: Mildly calcified leaflets . There was mild    regurgitation.  - Right atrium: Central venous pressure (est): 3 mm Hg.  - Atrial septum: No defect or patent foramen ovale was identified.  -  Tricuspid valve: There was trivial regurgitation.  - Pulmonary arteries: PA peak pressure: 20 mm Hg (S).  - Pericardium, extracardiac: There was no pericardial effusion.   Impressions:   - There has been improvement in LVEF compared to prior study in    August 2018.     Laboratory Data:  High Sensitivity Troponin:   Recent Labs  Lab 03/19/21 1728 03/19/21 1846 03/19/21 2249 03/20/21 0446  TROPONINIHS  165* 316* 1,410* 2,132*     Chemistry Recent Labs  Lab 03/19/21 1728 03/20/21 0255  NA 132* 134*  K 4.6 4.0  CL 101 107  CO2 22 18*  GLUCOSE 424* 175*  BUN 41* 37*  CREATININE 3.35* 3.25*  CALCIUM 9.0 8.8*  GFRNONAA 15* 16*  ANIONGAP 9 9    Recent Labs  Lab 03/19/21 1728  PROT 7.0  ALBUMIN 3.5  AST 8*  ALT 10  ALKPHOS 81  BILITOT <0.1*   Lipids  Recent Labs  Lab 03/20/21 0255  CHOL 240*  TRIG 248*  HDL 36*  LDLCALC 154*  CHOLHDL 6.7    Hematology Recent Labs  Lab 03/19/21 1728  WBC 8.2  RBC 3.45*  HGB 11.2*  HCT 35.2*  MCV 102.0*  MCH 32.5  MCHC 31.8  RDW 14.1  PLT 280   Thyroid No results for input(s): TSH, FREET4 in the last 168 hours.  BNPNo results for input(s): BNP, PROBNP in the last 168 hours.  DDimer No results for input(s): DDIMER in the last 168 hours.   Radiology/Studies:  DG Chest Portable 1 View  Result Date: 03/19/2021 CLINICAL DATA:  Chest pain EXAM: PORTABLE CHEST 1 VIEW COMPARISON:  Chest radiograph 02/24/2021 FINDINGS: The heart is at the upper limits of normal for size, unchanged. The upper mediastinal contours are stable. There is asymmetric elevation of the right hemidiaphragm, unchanged. There is no focal consolidation or pulmonary edema. There is no pleural effusion or pneumothorax. Previously seen interstitial prominence seen on the study from 02/24/2021 has resolved. There is no acute osseous abnormality. IMPRESSION: 1. Previously seen interstitial prominence on the study from 02/24/2021 has resolved. No radiographic evidence of acute cardiopulmonary process on the current study. 2. Borderline cardiomegaly and asymmetric elevation of the right hemidiaphragm, unchanged. Electronically Signed   By: Valetta Mole M.D.   On: 03/19/2021 18:09     Assessment and Plan:   1. NSTEMI inpatient with known CAD s/p BMS to RCA in 2004: Patient presented with persistent substernal chest pain which started on the morning of 03/19/2021.  Pain  noted to be different from previous MI.  EMS was activated and she noted resolution of pain after receiving full dose aspirin.  Her EKG has ST-T wave abnormalities which were reviewed with overnight STEMI coverage and felt to not represent a STEMI. HsTrop 165> 316> 1410> 2132.  In addition to CAD history she has poorly controlled HTN with SBP predominantly in the 150s-170s, DM type II with A1c 8.2, and HLD with LDL154 this admission.  Presentation and work-up is certainly concerning for progression of her CAD +/- ISR of previous BMS to RCA.  Unfortunately her creatinine is above baseline at 3.25 this morning from 3.35 yesterday with baseline appearing to be 2.1-2.3 in 2022. - Will repeat EKG this morning - Continue heparin GTT - Will update an echocardiogram - Anticipate ischemic evaluation this admission pending improvement in Cr  2. Chronic combined CHF: hx of cardiomyopathy with EF as low as 20%, improved to 40-45% in 2019, and ultimately recovered  function with EF 55-60% on last echo 07/2020. No significant volume overload complaints - Will update an echocardiogram in light of NSTEMI - Continue carvedilol - Will hold off on ACEi/ARB/ARNI at this time given AoCKD.  - Continue to monitor strict I&Os and daily weights  3. HTN: BP remains poorly controlled with SBP predominately in the 150s-170s.  - Will increase carvedilol to 6.106m BID - Can continue amlodipine for now  4. HLD: LDL 154 this admission. Titration of home crestor is limited by renal insufficiency - Will transition to atorvastatin 838mdaily - Will need repeat FLP/LFTs in 6-8 weeks  5. DM type 2: A1C 8.2 this admission on januvia and glipizide at home - Continue insulin per primary team - Can consider adding SGLT2-inhibitor if renal function improves  6. CKD stage IV: Cr up to 3.35>3.25 this admission from baseline 2.1-2.3 in 2022.  - Continue to avoid nephrotoxic agents    Risk Assessment/Risk Scores:     TIMI Risk Score  for Unstable Angina or Non-ST Elevation MI:   The patient's TIMI risk score is 5, which indicates a 26% risk of all cause mortality, new or recurrent myocardial infarction or need for urgent revascularization in the next 14 days.          For questions or updates, please contact [facility] consult [email or web address] for contact info under    Signed, KrAbigail ButtsPA-C  03/20/2021 9:31 AM  Attendning Note  Patient seen and discussed with PA [employer], I agree with her documentation. [age]o female history of CAD with BMS to RCA in 2004, HTN, DM2, history of CVA (has been on DAPT), HL, LV systolic dysfunction LVEF ranging 40-60% over last few studies. Presents with chest pain. EKG with some inferior elevations on presentation, from ER note discussed with cardiology and given patietn asymptomatic there were not plans for emergent cath.    K 4.6 Cr 3.35 (2.31 08/2020) WBC 8.2 Hgb 11.2 Plt 280 HgbA1c 8.2 LDL 154 Trop 165-->316-->1410-->2132 COVID neg Flu neg CXR no acute process EKG lateral ST depressions, inferior ST elevations in setting of inferior Qwaves Echo LVEF 40-45%, grade I dd (07/2020 echo LVEF 55-60%)  Assesment/plan  1.NSTEMI/CAD - history of prior RCA stent in 2004 - initial EKG with lateral ST depressions, some inferior ST elevations in setting of Qwaves - Echo LVEF 45-50%, grade I dd (07/2020 echo LVEF 55-60%) - from notes case discussed with cards, given absence of symptoms and renal function as well as mild trop elected no to proceed with emergent cath - medical therapy with ASA, atorva 80, coreg 6.2552mid, hep gtt. No ACE/ARB due to renal dysfunction  - has been pain free since episode prior to arrival - renal dysfunction dramaticlaly increases the risk of overt renal failure with contrast from cath - continue medical therapy, follow trop trends and symptoms, follow renal function. Considerations for cath would depend on clinic course and progression of renal  function.   2.HTN - high bp's, coreg was increased to 6.1m57md. - room to titrate norvasc if needed  3.Hyperlipidemia - LDL above goal, changed to atorvastatin 80mg47mly.   4. Systolic dysfunction - labile LVEF over the years. As low as 20%, over the last few years ranging from 40-60% - Jan 2023 Echo LVEF 40-45%, grade I dd (07/2020 echo LVEF 55-60%) - continue coreg, no ACE/ARB/ARNI/aldactone given renal dysfunction. May consider hydral/nitrates.    5. AKI on CKD IV - admit Cr 3.35 - Feb 26 2021 [facility] labs Cr 3.76 01/29/21 Cr 2.78 12/2020 Cr 3.78 - 08/2020 Cr 2.31  - unclear her baseline due to variation over the last several checks (several labs in Care [facility] at [facility] and with her nephrologist Dr Theador Hawthorne). Possible that current Cr may actually be near her new baseline.  - Renal US this admit not acute findings - gentle IVFs  Carlyle Dolly MD

## 2021-03-20 NOTE — Progress Notes (Signed)
°  Echocardiogram 2D Echocardiogram has been performed.  Monica Carlson 03/20/2021, 1:27 PM

## 2021-03-20 NOTE — Progress Notes (Signed)
ANTICOAGULATION CONSULT NOTE  Pharmacy Consult for Heparin Indication: chest pain/ACS Brief A/P: Heparin level subtherapeutic Increase Heparin rate Allergies  Allergen Reactions   Cedar     Swelling    Other     Teriyaki- swelling    Patient Measurements: Height: 5\' 1"  (154.9 cm) Weight: 84.1 kg (185 lb 4.8 oz) IBW/kg (Calculated) : 47.8 Heparin Dosing Weight: 66.6 kg   Vital Signs: Temp: 97.8 F (36.6 C) (01/28 0638) Temp Source: Oral (01/28 4888) BP: 151/91 (01/28 9169) Pulse Rate: 69 (01/28 0638)  Labs: Recent Labs    03/19/21 1728 03/19/21 1846 03/19/21 2249 03/20/21 0255 03/20/21 0446 03/20/21 0610  HGB 11.2*  --   --   --   --   --   HCT 35.2*  --   --   --   --   --   PLT 280  --   --   --   --   --   HEPARINUNFRC  --   --   --   --   --  0.15*  CREATININE 3.35*  --   --  3.25*  --   --   TROPONINIHS 165* 316* 1,410*  --  2,132*  --      Estimated Creatinine Clearance: 19 mL/min (A) (by C-G formula based on SCr of 3.25 mg/dL (H)).  Assessment: 56 y.o. female with chest pain for heparin   Goal of Therapy:  Heparin level 0.3-0.7 units/ml Monitor platelets by anticoagulation protocol: Yes   Plan:  Heparin 2000 units IV bolus, then increase heparin  1050 units/hr Check heparin level in 8 hours.   Phillis Knack, PharmD, BCPS  03/20/2021 6:45 AM

## 2021-03-20 NOTE — Progress Notes (Signed)
Pt troponin increased to 2, 132. Has no chest pain. MD notified.

## 2021-03-21 ENCOUNTER — Encounter (HOSPITAL_COMMUNITY): Payer: Self-pay | Admitting: Family Medicine

## 2021-03-21 DIAGNOSIS — I5043 Acute on chronic combined systolic (congestive) and diastolic (congestive) heart failure: Secondary | ICD-10-CM

## 2021-03-21 DIAGNOSIS — I219 Acute myocardial infarction, unspecified: Secondary | ICD-10-CM

## 2021-03-21 DIAGNOSIS — N179 Acute kidney failure, unspecified: Secondary | ICD-10-CM

## 2021-03-21 DIAGNOSIS — E1165 Type 2 diabetes mellitus with hyperglycemia: Secondary | ICD-10-CM | POA: Diagnosis not present

## 2021-03-21 DIAGNOSIS — I10 Essential (primary) hypertension: Secondary | ICD-10-CM | POA: Diagnosis not present

## 2021-03-21 DIAGNOSIS — I214 Non-ST elevation (NSTEMI) myocardial infarction: Secondary | ICD-10-CM | POA: Diagnosis not present

## 2021-03-21 DIAGNOSIS — I2119 ST elevation (STEMI) myocardial infarction involving other coronary artery of inferior wall: Principal | ICD-10-CM

## 2021-03-21 DIAGNOSIS — N184 Chronic kidney disease, stage 4 (severe): Secondary | ICD-10-CM

## 2021-03-21 LAB — BASIC METABOLIC PANEL
Anion gap: 10 (ref 5–15)
BUN: 33 mg/dL — ABNORMAL HIGH (ref 6–20)
CO2: 17 mmol/L — ABNORMAL LOW (ref 22–32)
Calcium: 8.4 mg/dL — ABNORMAL LOW (ref 8.9–10.3)
Chloride: 111 mmol/L (ref 98–111)
Creatinine, Ser: 3.41 mg/dL — ABNORMAL HIGH (ref 0.44–1.00)
GFR, Estimated: 15 mL/min — ABNORMAL LOW (ref >60)
Glucose, Bld: 82 mg/dL (ref 70–99)
Potassium: 3.9 mmol/L (ref 3.5–5.1)
Sodium: 138 mmol/L (ref 135–145)

## 2021-03-21 LAB — TROPONIN I (HIGH SENSITIVITY): Troponin I (High Sensitivity): 885 ng/L (ref <18)

## 2021-03-21 LAB — CBC
HCT: 31 % — ABNORMAL LOW (ref 36.0–46.0)
Hemoglobin: 9.8 g/dL — ABNORMAL LOW (ref 12.0–15.0)
MCH: 31.9 pg (ref 26.0–34.0)
MCHC: 31.6 g/dL (ref 30.0–36.0)
MCV: 101 fL — ABNORMAL HIGH (ref 80.0–100.0)
Platelets: 260 10*3/uL (ref 150–400)
RBC: 3.07 MIL/uL — ABNORMAL LOW (ref 3.87–5.11)
RDW: 14.2 % (ref 11.5–15.5)
WBC: 8.7 10*3/uL (ref 4.0–10.5)
nRBC: 0 % (ref 0.0–0.2)

## 2021-03-21 LAB — HEPARIN LEVEL (UNFRACTIONATED)
Heparin Unfractionated: 0.32 IU/mL (ref 0.30–0.70)
Heparin Unfractionated: 0.39 IU/mL (ref 0.30–0.70)

## 2021-03-21 LAB — GLUCOSE, CAPILLARY
Glucose-Capillary: 91 mg/dL (ref 70–99)
Glucose-Capillary: 193 mg/dL — ABNORMAL HIGH (ref 70–99)
Glucose-Capillary: 102 mg/dL — ABNORMAL HIGH (ref 70–99)
Glucose-Capillary: 135 mg/dL — ABNORMAL HIGH (ref 70–99)

## 2021-03-21 LAB — MAGNESIUM: Magnesium: 2 mg/dL (ref 1.7–2.4)

## 2021-03-21 NOTE — Consult Note (Signed)
Renal Service Consult Note Monica Carlson  Monica Carlson 03/21/2021 Monica Blazing, MD Requesting Physician: Dr. Starla Carlson  Reason for Consult: Renal failure HPI: The patient is a 56 y.o. year-old w/ hx of CHF, DM2, HL, HTN, MI/ CAD, CVA presented to ED on 03/19/21 w/ c/o substernal CP.  In ED EKG did not show STEMI, BP 190/110, BS 543.  Creat was 3.3 (b/l 2.1), trop 165 > 316. Cardiology started IV heparin. Since admission creat is stable around 3- 3.5. We are asked to see for renal failure.   Pt seen in room. F/b Dr Monica Carlson, lives in [place] herself. Has an aide who comes by M-F to help.  Pt is blind in L eye. Hx MI / stent in 2004, hx CHF "x3", hx of mini-strokes 3 times also. Takes BP meds for Dr Monica Carlson. Came in for chest pain.    ROS - denies CP, no joint pain, no HA, no blurry vision, no rash, no diarrhea, no nausea/ vomiting, no dysuria, no difficulty voiding   Past Medical History  Past Medical History:  Diagnosis Date   Cervical cancer (Monica Carlson)    cryotherapy and colposcopy[place]; 1991   CHF (congestive heart failure) (Monica Carlson) 07-2014   Diabetes mellitus without complication (Monica Carlson) 0093   High cholesterol    Hypertension    MI (myocardial infarction) (Monica Carlson)    2004   Stroke (Monica Carlson) 09/2014, 2013   Vaginal Pap smear, abnormal    Past Surgical History  Past Surgical History:  Procedure Laterality Date   CHOLECYSTECTOMY  1992   CORONARY STENT PLACEMENT  2004   TUBAL LIGATION     Family History  Family History  Problem Relation Age of Onset   Migraines Mother    Heart disease Father    Hypertension Father    Diabetes Father    Migraines Maternal Grandfather    Heart attack Maternal Grandfather    Lung cancer Paternal Grandmother    Colon cancer Neg Hx    Colon polyps Neg Hx    Social History  reports that she has never smoked. She has never used smokeless tobacco. She reports that she does not drink alcohol and does not use drugs. Allergies   Allergies  Allergen Reactions   Cedar     Swelling    Other     Teriyaki- swelling   Home medications Prior to Admission medications   Medication Sig Start Date End Date Taking? Authorizing Provider  acetaminophen (TYLENOL) 500 MG tablet Take 1,000-1,500 mg by mouth every 6 (six) hours as needed for mild pain.   Yes [provider]  albuterol (VENTOLIN HFA) 108 (90 Base) MCG/ACT inhaler Inhale 2 puffs into the lungs every 4 (four) hours as needed for shortness of breath. 02/27/21  Yes [provider]  amLODipine (NORVASC) 5 MG tablet Take 5 mg by mouth daily. 08/05/20  Yes [provider]  aspirin 325 MG EC tablet Take 325 mg by mouth daily.   Yes [provider]  carvedilol (COREG) 6.25 MG tablet Take 6.25 mg by mouth 2 (two) times daily. 02/09/21  Yes [provider]  Cholecalciferol (VITAMIN D) 50 MCG (2000 UT) CAPS Take 2,000 Units by mouth daily.   Yes [provider]  clopidogrel (PLAVIX) 75 MG tablet TAKE ONE TABLET BY MOUTH EVERY DAY WITH BREAKFAST Patient taking differently: Take 75 mg by mouth daily. 07/10/17  Yes Hagler, Apolonio Schneiders, MD  diclofenac Sodium (VOLTAREN) 1 % GEL Apply 2 g topically  4 (four) times daily as needed (pain/swelling). 06/07/19  Yes Monica Carlson, Monica R, PA-C  furosemide (LASIX) 20 MG tablet Take 20 mg by mouth 2 (two) times daily.   Yes [provider]  gabapentin (NEURONTIN) 300 MG capsule Take 300 mg by mouth 2 (two) times daily. 10/11/17  Yes [provider]  glipiZIDE (GLUCOTROL) 10 MG tablet TAKE 1 TABLET BY MOUTH TWICE DAILY BEFORE MEALS Patient taking differently: Take 10 mg by mouth 2 (two) times daily before a meal. 12/31/20  Yes Monica Carlson, Monica J, NP  JANUVIA 50 MG tablet TAKE 1 TABLET BY MOUTH EVERY DAY Patient taking differently: Take 50 mg by mouth daily. 02/23/21  Yes Monica Romp, NP  levothyroxine (SYNTHROID) 125 MCG tablet TAKE 1 TABLET BY MOUTH DAILY BEFORE  BREAKFAST Patient taking differently: Take 125 mcg by mouth daily before breakfast. 02/23/21  Yes Monica Carlson, Monica Beets, NP  meclizine (ANTIVERT) 25 MG tablet Take 1 tablet (25 mg total) by mouth 3 (three) times daily as needed for dizziness. 11/05/20  Yes Monica Carlson., NP  nitroGLYCERIN (NITROSTAT) 0.4 MG SL tablet Place 1 tablet (0.4 mg total) under the tongue every 5 (five) minutes as needed for chest pain. 08/15/17  Yes Hagler, Apolonio Schneiders, MD  potassium chloride SA (KLOR-CON M) 20 MEQ tablet Take 20 mEq by mouth 2 (two) times daily. 03/03/21  Yes [provider]  rosuvastatin (CRESTOR) 20 MG tablet Take 20 mg by mouth daily. 08/05/20  Yes [provider]  traZODone (DESYREL) 50 MG tablet Take 50 mg by mouth at bedtime as needed. 08/05/20  Yes [provider]  vitamin C (ASCORBIC ACID) 500 MG tablet Take 500 mg by mouth daily. 02/27/21  Yes [provider]  Zinc 50 MG TABS Take 50 mg by mouth daily. 02/27/21  Yes [provider]  Blood Glucose Monitoring Suppl (ACCU-CHEK GUIDE ME) w/Device KIT 1 Piece by Does not apply route as directed. 07/09/18   Cassandria Anger, MD  carvedilol (COREG) 3.125 MG tablet Take 1 tablet (3.125 mg total) by mouth 2 (two) times daily with a meal. Patient not taking: Reported on 03/20/2021 10/25/17   Caren Macadam, MD  glucose blood (ACCU-CHEK GUIDE) test strip Use as instructed 4 x daily. e11.65 07/10/18   Cassandria Anger, MD  glucose blood test strip 1 each by Other route 2 (two) times daily. Use as instructed bid. E11.65 12/18/18   Cassandria Anger, MD  Lancets Thin MISC 1 each by Does not apply route 4 (four) times daily. E11.65 Pt has Prodigy meter 10/02/18   Cassandria Anger, MD  pantoprazole (PROTONIX) 40 MG tablet TAKE ONE TABLET BY MOUTH DAILY Patient not taking: Reported on 03/20/2021 07/10/17   Caren Macadam, MD  Vitamin D, Ergocalciferol, (DRISDOL) 50000 units CAPS capsule TAKE ONE CAPSULE BY MOUTH ONCE  WEEKLY ON MONDAY 11/08/17   [provider]     Vitals:   03/20/21 1652 03/20/21 2000 03/21/21 0523 03/21/21 0938  BP: 127/76 135/82 128/73 129/81  Pulse: 77 72 75 80  Resp: '17 18 16 15  ' Temp:  98 F (36.7 C) 98.3 F (36.8 C) 97.6 F (36.4 C)  TempSrc:  Oral Oral Oral  SpO2:  95% 96% 98%  Weight:      Height:       Exam Gen alert, no distress No rash, cyanosis or gangrene Sclera anicteric, throat clear  No jvd or bruits, neck veins flat Chest occ L basilar rales, Carlson clear  RRR no RG Abd soft ntnd no mass or ascites +bs GU deferred MS no joint effusions or deformity Ext no LE or UE edema, no wounds or ulcers Neuro is alert, Ox 3 , nf, calm    Home meds include - norvasc, asa, lasix 20 bid, synthroid, sl ntg, crestor, trazodone, coreg, protonix, prns/ vits/ supps      Date   Creat  eGFR     2017  0.6- 0.8     2018  0.78- 1.46 41- >60     2019  1.98- 2.18     2020  1.7       2021  2.22  16 Mar 2020  2.1  15 September 2020  2.31     Jan 4-7, 2023 3.30- 3.70 14- 16 min, in CareEverywhere     Mar 19, 2021 3.35  15     Jan 28  3.25  16     Jan 29  3.41  15         UNa, UCr pending     UA pending    Renal US  > 9- 11 cm kidneys w/o hydro      CXR 1/27 - IMPRESSION: 1. Previously seen interstitial prominence on the study from 02/24/2021 has resolved. No radiographic evidence of acute cardiopulmonary process on the current study. 2. Borderline cardiomegaly and asymmetric elevation of the right hemidiaphragm, unchanged.      Na 138  K 3.9  CO2 17  BUN 33  cr 3.41  Hb 9.8  WBC 8K      BP's here wnl 120- 140/ 79-91   HR 70's  temp 98      I/O since admit = 3.7 L in and no UOP out = +3.7 L   Assessment/ Plan: AKI on CKD IV - b/l creat 3.3- 3.7 from early Jan 2023 in [facility], eGFR 14-16 ml/min.  Not sure this is AKI vs progression of her underlying CKD. Renal US w/o obstruction. UA and lytes pending. OK to continue IVF"s, pt is euvolemic.  BP's wnl, no acei/ ARB  involved. Not sure that her renal function will improve w/ IVF's though, suspect this is her new baseline. Have d/w pt and question answered. Will follow.  NSTEMI - getting IV heparin HTN - bp's controlled w/ norvasc , coreg here DM2 Blind L eye Anemia ckd - Hb ~10      Kelly Splinter  MD 03/21/2021, 12:14 PM  Recent Labs  Lab 03/19/21 1728 03/21/21 0611  WBC 8.2 8.7  HGB 11.2* 9.8*   Recent Labs  Lab 03/19/21 1728 03/20/21 0255 03/21/21 0611  K 4.6 4.0 3.9  BUN 41* 37* 33*  CREATININE 3.35* 3.25* 3.41*  ALBUMIN 3.5  --   --   CALCIUM 9.0 8.8* 8.4*

## 2021-03-21 NOTE — Progress Notes (Signed)
°  Transition of Care [facility]) Screening Note   Patient Details  Name: Monica Carlson Date of Birth: [birth year]   Transition of Care [facility]) CM/SW Contact:    Bary Castilla, LCSW Phone Number: 03/21/2021, 11:36 AM    Transition of Care Department [facility]) has reviewed patient and no TOC needs have been identified at this time. We will continue to monitor patient advancement through interdisciplinary progression rounds. If new patient transition needs arise, please place a TOC consult.

## 2021-03-21 NOTE — Progress Notes (Signed)
Patient ID: Monica Carlson, female   DOB: 1965/02/26, 56 y.o.   MRN: [id number]  PROGRESS NOTE    Monica Carlson  RDE:[id number] DOB: 30-Oct-1965 DOA: 03/19/2021 PCP: Gwenlyn Saran, [place]   Brief Narrative:  56 y.o. female with a history of right MCA stroke, coronary artery disease/MI status post stenting, hypertensive cardiomyopathy, diabetes mellitus type 2, chronic kidney disease stage IV presented with chest pain to [facility] on 03/19/2021.  On presentation, creatinine was 3.35, baseline around 2.1-2.3.  Initial troponin was 165, increased to 316 along with EKG changes.  Cardiology recommended to start heparin drip and transferred to [facility] for further evaluation.  Assessment & Plan:   Non-STEMI in a patient with history of CAD and history of stenting -Presented with chest pain, positive troponins and EKG changes -Continue heparin drip.  Continue aspirin, statin and Coreg.  Cardiology following.  Echo showed EF of 40 to 45% with grade 1 diastolic dysfunction -Currently chest pain-free  Chronic combined CHF -Echo as above.  Strict input and output.  Daily weights.  Currently on IV fluids.  Continue Coreg.  Cardiology following.  Acute kidney injury on chronic kidney disease stage IV Acute metabolic acidosis -Creatinine 3.35 on presentation, baseline around 2.1-2.3.  Creatinine 3.4 on today.  Renal ultrasound was negative for hydronephrosis.  Still acidotic.  Currently on normal saline at 100 cc an hour.  Will consult nephrology.  Hyperlipidemia -LDL 154, triglycerides 248 and cholesterol 240.  Continue statin  Diabetes mellitus type 2 with hyperglycemia  -A1c 8.2.  Continue CBGs with SSI along with long-acting insulin  Hypertension -Monitor blood pressure.  Continue amlodipine, Coreg  Hypothyroidism -Continue levothyroxine  Obesity -Outpatient follow-up   DVT prophylaxis: Heparin drip Code Status: Full Family Communication: None at  bedside Disposition Plan: Status is: Inpatient  Remains inpatient appropriate because: Of severity of illness and need for further cardiac work-up   Consultants: Cardiology.  Will consult nephrology  Procedures: Echo as below Antimicrobials: None   Subjective: Patient seen and examined at bedside.  Feels slightly short of breath with exertion.  Currently denies any chest pain.  No overnight fever or vomiting reported. Objective: Vitals:   03/20/21 1353 03/20/21 1652 03/20/21 2000 03/21/21 0523  BP: 125/77 127/76 135/82 128/73  Pulse: 70 77 72 75  Resp: 14 17 18 16   Temp: 98.2 F (36.8 C)  98 F (36.7 C) 98.3 F (36.8 C)  TempSrc: Oral  Oral Oral  SpO2:   95% 96%  Weight:      Height:        Intake/Output Summary (Last 24 hours) at 03/21/2021 0758 Last data filed at 03/21/2021 0300 Gross per 24 hour  Intake 2255.66 ml  Output --  Net 2255.66 ml    Filed Weights   03/19/21 1659 03/20/21 0236  Weight: 82.6 kg 84.1 kg    Examination:  General exam: On room air.  No acute distress.   Respiratory system: Decreased breath sounds at bases bilaterally with some scattered crackles  cardiovascular system: Rate controlled currently; S1-S2 heard gastrointestinal system: Abdomen is obese, distended slightly; soft and nontender.  Bowel sounds are heard  extremities: Mild lower extremity edema present; no clubbing  Central nervous system: Awake and alert.  No focal neurological deficits.  Moves extremities Skin: No obvious ecchymosis/lesions  psychiatry: Mood, affect and judgment are normal   Data Reviewed: I have personally reviewed following labs and imaging studies  CBC: Recent Labs  Lab 03/19/21 1728 03/21/21  0630  WBC 8.2 8.7  NEUTROABS 5.2  --   HGB 11.2* 9.8*  HCT 35.2* 31.0*  MCV 102.0* 101.0*  PLT 280 160    Basic Metabolic Panel: Recent Labs  Lab 03/19/21 1728 03/20/21 0255 03/21/21 0611  NA 132* 134* 138  K 4.6 4.0 3.9  CL 101 107 111  CO2 22  18* 17*  GLUCOSE 424* 175* 82  BUN 41* 37* 33*  CREATININE 3.35* 3.25* 3.41*  CALCIUM 9.0 8.8* 8.4*  MG  --   --  2.0    GFR: Estimated Creatinine Clearance: 18.1 mL/min (A) (by C-G formula based on SCr of 3.41 mg/dL (H)). Liver Function Tests: Recent Labs  Lab 03/19/21 1728  AST 8*  ALT 10  ALKPHOS 81  BILITOT <0.1*  PROT 7.0  ALBUMIN 3.5    No results for input(s): LIPASE, AMYLASE in the last 168 hours. No results for input(s): AMMONIA in the last 168 hours. Coagulation Profile: No results for input(s): INR, PROTIME in the last 168 hours. Cardiac Enzymes: No results for input(s): CKTOTAL, CKMB, CKMBINDEX, TROPONINI in the last 168 hours. BNP (last 3 results) No results for input(s): PROBNP in the last 8760 hours. HbA1C: Recent Labs    03/20/21 0255  HGBA1C 8.2*    CBG: Recent Labs  Lab 03/20/21 0819 03/20/21 1142 03/20/21 1556 03/20/21 2044 03/21/21 0739  GLUCAP 140* 168* 151* 184* 91    Lipid Profile: Recent Labs    03/20/21 0255  CHOL 240*  HDL 36*  LDLCALC 154*  TRIG 248*  CHOLHDL 6.7    Thyroid Function Tests: No results for input(s): TSH, T4TOTAL, FREET4, T3FREE, THYROIDAB in the last 72 hours. Anemia Panel: No results for input(s): VITAMINB12, FOLATE, FERRITIN, TIBC, IRON, RETICCTPCT in the last 72 hours. Sepsis Labs: No results for input(s): PROCALCITON, LATICACIDVEN in the last 168 hours.  Recent Results (from the past 240 hour(s))  Resp Panel by RT-PCR (Flu A&B, Covid) Nasopharyngeal Swab     Status: None   Collection Time: 03/19/21  9:10 PM   Specimen: Nasopharyngeal Swab; Nasopharyngeal(NP) swabs in vial transport medium  Result Value Ref Range Status   SARS Coronavirus 2 by RT PCR NEGATIVE NEGATIVE Final    Comment: (NOTE) SARS-CoV-2 target nucleic acids are NOT DETECTED.  The SARS-CoV-2 RNA is generally detectable in upper respiratory specimens during the acute phase of infection. The lowest concentration of SARS-CoV-2 viral  copies this assay can detect is 138 copies/mL. A negative result does not preclude SARS-Cov-2 infection and should not be used as the sole basis for treatment or other patient management decisions. A negative result may occur with  improper specimen collection/handling, submission of specimen other than nasopharyngeal swab, presence of viral mutation(s) within the areas targeted by this assay, and inadequate number of viral copies(<138 copies/mL). A negative result must be combined with clinical observations, patient history, and epidemiological information. The expected result is Negative.  Fact Sheet for Patients:  EntrepreneurPulse.com.au  Fact Sheet for Healthcare Providers:  IncredibleEmployment.be  This test is no t yet approved or cleared by the [place] FDA and  has been authorized for detection and/or diagnosis of SARS-CoV-2 by FDA under an Emergency Use Authorization (EUA). This EUA will remain  in effect (meaning this test can be used) for the duration of the COVID-19 declaration under Section 564(b)(1) of the Act, 21 U.S.C.section 360bbb-3(b)(1), unless the authorization is terminated  or revoked sooner.       Influenza A by PCR NEGATIVE NEGATIVE  Final   Influenza B by PCR NEGATIVE NEGATIVE Final    Comment: (NOTE) The Xpert Xpress SARS-CoV-2/FLU/RSV plus assay is intended as an aid in the diagnosis of influenza from Nasopharyngeal swab specimens and should not be used as a sole basis for treatment. Nasal washings and aspirates are unacceptable for Xpert Xpress SARS-CoV-2/FLU/RSV testing.  Fact Sheet for Patients: EntrepreneurPulse.com.au  Fact Sheet for Healthcare Providers: IncredibleEmployment.be  This test is not yet approved or cleared by the [place] FDA and has been authorized for detection and/or diagnosis of SARS-CoV-2 by FDA under an Emergency Use Authorization (EUA). This  EUA will remain in effect (meaning this test can be used) for the duration of the COVID-19 declaration under Section 564(b)(1) of the Act, 21 U.S.C. section 360bbb-3(b)(1), unless the authorization is terminated or revoked.  Performed at [facility], [address] [place], [place] [address]           Radiology Studies: US RENAL  Result Date: 03/20/2021 CLINICAL DATA:  Acute kidney injury. EXAM: RENAL / URINARY TRACT ULTRASOUND COMPLETE COMPARISON:  August 28, 2017. FINDINGS: Right Kidney: Renal measurements: 11.5 x 4.8 x 4.6 cm = volume: 134 mL. Echogenicity within normal limits. No mass or hydronephrosis visualized. Left Kidney: Renal measurements: 9.2 x 5.4 x 4.1 cm = volume: 106 mL. 1 cm simple cyst is noted. Echogenicity within normal limits. No mass or hydronephrosis visualized. Bladder: Appears normal for degree of bladder distention. Other: None. IMPRESSION: No significant renal abnormality is noted. Electronically Signed   By: Marijo Conception M.D.   On: 03/20/2021 11:11   DG Chest Portable 1 View  Result Date: 03/19/2021 CLINICAL DATA:  Chest pain EXAM: PORTABLE CHEST 1 VIEW COMPARISON:  Chest radiograph 02/24/2021 FINDINGS: The heart is at the upper limits of normal for size, unchanged. The upper mediastinal contours are stable. There is asymmetric elevation of the right hemidiaphragm, unchanged. There is no focal consolidation or pulmonary edema. There is no pleural effusion or pneumothorax. Previously seen interstitial prominence seen on the study from 02/24/2021 has resolved. There is no acute osseous abnormality. IMPRESSION: 1. Previously seen interstitial prominence on the study from 02/24/2021 has resolved. No radiographic evidence of acute cardiopulmonary process on the current study. 2. Borderline cardiomegaly and asymmetric elevation of the right hemidiaphragm, unchanged. Electronically Signed   By: Valetta Mole M.D.   On: 03/19/2021 18:09   ECHOCARDIOGRAM COMPLETE  Result  Date: 03/20/2021    ECHOCARDIOGRAM REPORT   Patient Name:   Monica Carlson Date of Exam: 03/20/2021 Medical Rec #:  [id number]    Height:       61.0 in Accession #:    [id number]   Weight:       185.3 lb Date of Birth:  November 23, 1965   BSA:          1.828 m Patient Age:    66 years     BP:           131/81 mmHg Patient Gender: F            HR:           69 bpm. Exam Location:  Inpatient Procedure: 2D Echo Indications:     NSTEMI  History:         Patient has prior history of Echocardiogram examinations, most                  recent 07/30/2020. Cardiomyopathy, CAD; Risk  Factors:Hypertension, Dyslipidemia and Diabetes.  Sonographer:     [place] Referring Phys:  [id number] Abigail Butts Diagnosing Phys: Carlyle Dolly MD IMPRESSIONS  1. Left ventricular ejection fraction, by estimation, is 40 to 45%. The left ventricle has mildly decreased function. The left ventricle demonstrates global hypokinesis. Left ventricular diastolic parameters are consistent with Grade I diastolic dysfunction (impaired relaxation). Elevated left atrial pressure.  2. Right ventricular systolic function is normal. The right ventricular size is normal. Tricuspid regurgitation signal is inadequate for assessing PA pressure.  3. The mitral valve is abnormal. Mild mitral valve regurgitation. No evidence of mitral stenosis.  4. The aortic valve is tricuspid. There is moderate calcification of the aortic valve. There is moderate thickening of the aortic valve. Aortic valve regurgitation is not visualized. No aortic stenosis is present. FINDINGS  Left Ventricle: Left ventricular ejection fraction, by estimation, is 40 to 45%. The left ventricle has mildly decreased function. The left ventricle demonstrates global hypokinesis. The left ventricular internal cavity size was normal in size. There is  no left ventricular hypertrophy. Left ventricular diastolic parameters are consistent with Grade I diastolic dysfunction (impaired  relaxation). Elevated left atrial pressure. Right Ventricle: The right ventricular size is normal. Right vetricular wall thickness was not well visualized. Right ventricular systolic function is normal. Tricuspid regurgitation signal is inadequate for assessing PA pressure. Left Atrium: Left atrial size was normal in size. Right Atrium: Right atrial size was normal in size. Pericardium: There is no evidence of pericardial effusion. Mitral Valve: The mitral valve is abnormal. There is mild thickening of the mitral valve leaflet(s). There is mild calcification of the mitral valve leaflet(s). Mild mitral annular calcification. Mild mitral valve regurgitation. No evidence of mitral valve stenosis. Tricuspid Valve: The tricuspid valve is not well visualized. Tricuspid valve regurgitation is not demonstrated. No evidence of tricuspid stenosis. Aortic Valve: The aortic valve is tricuspid. There is moderate calcification of the aortic valve. There is moderate thickening of the aortic valve. There is moderate aortic valve annular calcification. Aortic valve regurgitation is not visualized. No aortic stenosis is present. Aortic valve mean gradient measures 5.2 mmHg. Aortic valve peak gradient measures 9.7 mmHg. Aortic valve area, by VTI measures 1.53 cm. Pulmonic Valve: The pulmonic valve was not well visualized. Pulmonic valve regurgitation is not visualized. No evidence of pulmonic stenosis. Aorta: The aortic root is normal in size and structure. IAS/Shunts: No atrial level shunt detected by color flow Doppler.  LEFT VENTRICLE PLAX 2D LVIDd:         5.10 cm      Diastology LVIDs:         4.30 cm      LV e' medial:    4.46 cm/s LV PW:         1.00 cm      LV E/e' medial:  20.0 LV IVS:        1.10 cm      LV e' lateral:   5.33 cm/s LVOT diam:     1.90 cm      LV E/e' lateral: 16.8 LV SV:         48 LV SV Index:   26 LVOT Area:     2.84 cm  LV Volumes (MOD) LV vol d, MOD A4C: 123.0 ml LV vol s, MOD A4C: 78.6 ml LV SV MOD  A4C:     123.0 ml RIGHT VENTRICLE            IVC RV S  prime:     9.14 cm/s  IVC diam: 1.50 cm TAPSE (M-mode): 2.2 cm LEFT ATRIUM             Index LA diam:        3.80 cm 2.08 cm/m LA Vol (A2C):   56.9 ml 31.12 ml/m LA Vol (A4C):   42.2 ml 23.08 ml/m LA Biplane Vol: 50.2 ml 27.45 ml/m  AORTIC VALVE AV Area (Vmax):    1.66 cm AV Area (Vmean):   1.46 cm AV Area (VTI):     1.53 cm AV Vmax:           155.37 cm/s AV Vmean:          109.183 cm/s AV VTI:            0.315 m AV Peak Grad:      9.7 mmHg AV Mean Grad:      5.2 mmHg LVOT Vmax:         90.70 cm/s LVOT Vmean:        56.200 cm/s LVOT VTI:          0.170 m LVOT/AV VTI ratio: 0.54  AORTA Ao Root diam: 2.90 cm Ao Asc diam:  3.60 cm MITRAL VALVE MV Area (PHT): 3.72 cm     SHUNTS MV Decel Time: 204 msec     Systemic VTI:  0.17 m MR Peak grad: 91.8 mmHg     Systemic Diam: 1.90 cm MR Mean grad: 58.0 mmHg MR Vmax:      479.00 cm/s MR Vmean:     365.0 cm/s MV E velocity: 89.30 cm/s MV A velocity: 112.00 cm/s MV E/A ratio:  0.80 Carlyle Dolly MD Electronically signed by Carlyle Dolly MD Signature Date/Time: 03/20/2021/1:54:00 PM    Final (Updated)         Scheduled Meds:  amLODipine  5 mg Oral Daily   aspirin  325 mg Oral Daily   atorvastatin  80 mg Oral QHS   carvedilol  6.25 mg Oral BID WC   feeding supplement  237 mL Oral BID BM   gabapentin  200 mg Oral BID   insulin aspart  0-15 Units Subcutaneous TID WC   insulin aspart  0-5 Units Subcutaneous QHS   insulin detemir  20 Units Subcutaneous QHS   levothyroxine  125 mcg Oral QAC breakfast   pantoprazole  40 mg Oral Daily   Continuous Infusions:  sodium chloride 100 mL/hr at 03/21/21 0729   heparin 1,200 Units/hr (03/20/21 2213)          Aline August, MD [facility] 03/21/2021, 7:58 AM

## 2021-03-21 NOTE — Progress Notes (Signed)
Progress Note  Patient Name: Monica Carlson Date of Encounter: 03/21/2021  [facility] Cardiologist:  Harl Bowie ( previously Bronson Ing)   Subjective  56 yo with hx of CAD, s/p BMS in 2004, chronic combined CHF, HTN, HLD, CKD   Admitted with Inferior wall STEMI to [facility].   Was transferred to [facility] on 03/20/21  She denies any cp this am  Feeling better   Troponins peaked at 2132  Echo shows EF 40-45%.  Global hypokinesis,  grade 1 DD Previous echo from June, 2022 showed a normal LV function with EF 55-60%.    Inpatient Medications    Scheduled Meds:  amLODipine  5 mg Oral Daily   aspirin  325 mg Oral Daily   atorvastatin  80 mg Oral QHS   carvedilol  6.25 mg Oral BID WC   feeding supplement  237 mL Oral BID BM   gabapentin  200 mg Oral BID   insulin aspart  0-15 Units Subcutaneous TID WC   insulin aspart  0-5 Units Subcutaneous QHS   insulin detemir  20 Units Subcutaneous QHS   levothyroxine  125 mcg Oral QAC breakfast   pantoprazole  40 mg Oral Daily   Continuous Infusions:  sodium chloride 100 mL/hr at 03/21/21 0729   heparin 1,250 Units/hr (03/21/21 0946)   PRN Meds: acetaminophen, ondansetron (ZOFRAN) IV, traZODone   Vital Signs    Vitals:   03/20/21 1652 03/20/21 2000 03/21/21 0523 03/21/21 0938  BP: 127/76 135/82 128/73 129/81  Pulse: 77 72 75 80  Resp: 17 18 16 15   Temp:  98 F (36.7 C) 98.3 F (36.8 C) 97.6 F (36.4 C)  TempSrc:  Oral Oral Oral  SpO2:  95% 96% 98%  Weight:      Height:        Intake/Output Summary (Last 24 hours) at 03/21/2021 0946 Last data filed at 03/21/2021 0300 Gross per 24 hour  Intake 2255.66 ml  Output --  Net 2255.66 ml   Last 3 Weights 03/20/2021 03/19/2021 12/09/2020  Weight (lbs) 185 lb 4.8 oz 182 lb 193 lb  Weight (kg) 84.052 kg 82.555 kg 87.544 kg      Telemetry    NSR - Personally Reviewed  ECG     Review of her ECGs from Jan. 27 - 28 show an acute Inferior wall STEMI -  She now has evolving INf. STEMI  changes  Personally Reviewed  Physical Exam    GEN: No acute distress.   Neck: No JVD Cardiac: RRR, no murmurs, rubs, or gallops.  Respiratory: Clear to auscultation bilaterally. GI: Soft, nontender, non-distended  MS: No edema; No deformity. Neuro:  Nonfocal  Psych: Normal affect   Labs    High Sensitivity Troponin:   Recent Labs  Lab 03/19/21 1728 03/19/21 1846 03/19/21 2249 03/20/21 0446  TROPONINIHS 165* 316* 1,410* 2,132*     Chemistry Recent Labs  Lab 03/19/21 1728 03/20/21 0255 03/21/21 0611  NA 132* 134* 138  K 4.6 4.0 3.9  CL 101 107 111  CO2 22 18* 17*  GLUCOSE 424* 175* 82  BUN 41* 37* 33*  CREATININE 3.35* 3.25* 3.41*  CALCIUM 9.0 8.8* 8.4*  MG  --   --  2.0  PROT 7.0  --   --   ALBUMIN 3.5  --   --   AST 8*  --   --   ALT 10  --   --   ALKPHOS 81  --   --   BILITOT <  0.1*  --   --   GFRNONAA 15* 16* 15*  ANIONGAP 9 9 10     Lipids  Recent Labs  Lab 03/20/21 0255  CHOL 240*  TRIG 248*  HDL 36*  LDLCALC 154*  CHOLHDL 6.7    Hematology Recent Labs  Lab 03/19/21 1728 03/21/21 0611  WBC 8.2 8.7  RBC 3.45* 3.07*  HGB 11.2* 9.8*  HCT 35.2* 31.0*  MCV 102.0* 101.0*  MCH 32.5 31.9  MCHC 31.8 31.6  RDW 14.1 14.2  PLT 280 260   Thyroid No results for input(s): TSH, FREET4 in the last 168 hours.  BNPNo results for input(s): BNP, PROBNP in the last 168 hours.  DDimer No results for input(s): DDIMER in the last 168 hours.   Radiology    US RENAL  Result Date: 03/20/2021 CLINICAL DATA:  Acute kidney injury. EXAM: RENAL / URINARY TRACT ULTRASOUND COMPLETE COMPARISON:  August 28, 2017. FINDINGS: Right Kidney: Renal measurements: 11.5 x 4.8 x 4.6 cm = volume: 134 mL. Echogenicity within normal limits. No mass or hydronephrosis visualized. Left Kidney: Renal measurements: 9.2 x 5.4 x 4.1 cm = volume: 106 mL. 1 cm simple cyst is noted. Echogenicity within normal limits. No mass or hydronephrosis visualized. Bladder: Appears normal for degree of  bladder distention. Other: None. IMPRESSION: No significant renal abnormality is noted. Electronically Signed   By: Marijo Conception M.D.   On: 03/20/2021 11:11   DG Chest Portable 1 View  Result Date: 03/19/2021 CLINICAL DATA:  Chest pain EXAM: PORTABLE CHEST 1 VIEW COMPARISON:  Chest radiograph 02/24/2021 FINDINGS: The heart is at the upper limits of normal for size, unchanged. The upper mediastinal contours are stable. There is asymmetric elevation of the right hemidiaphragm, unchanged. There is no focal consolidation or pulmonary edema. There is no pleural effusion or pneumothorax. Previously seen interstitial prominence seen on the study from 02/24/2021 has resolved. There is no acute osseous abnormality. IMPRESSION: 1. Previously seen interstitial prominence on the study from 02/24/2021 has resolved. No radiographic evidence of acute cardiopulmonary process on the current study. 2. Borderline cardiomegaly and asymmetric elevation of the right hemidiaphragm, unchanged. Electronically Signed   By: Valetta Mole M.D.   On: 03/19/2021 18:09   ECHOCARDIOGRAM COMPLETE  Result Date: 03/20/2021    ECHOCARDIOGRAM REPORT   Patient Name:   Monica Carlson Date of Exam: 03/20/2021 Medical Rec #:  [id number]    Height:       61.0 in Accession #:    [id number]   Weight:       185.3 lb Date of Birth:  [birth year]   BSA:          1.828 m Patient Age:    77 years     BP:           131/81 mmHg Patient Gender: F            HR:           69 bpm. Exam Location:  Inpatient Procedure: 2D Echo Indications:     NSTEMI  History:         Patient has prior history of Echocardiogram examinations, most                  recent 07/30/2020. Cardiomyopathy, CAD; Risk                  Factors:Hypertension, Dyslipidemia and Diabetes.  Sonographer:     Johny Chess RDCS Referring Phys:  [phone] [place] M.  KROEGER Diagnosing Phys: Carlyle Dolly MD IMPRESSIONS  1. Left ventricular ejection fraction, by estimation, is 40 to 45%. The left  ventricle has mildly decreased function. The left ventricle demonstrates global hypokinesis. Left ventricular diastolic parameters are consistent with Grade I diastolic dysfunction (impaired relaxation). Elevated left atrial pressure.  2. Right ventricular systolic function is normal. The right ventricular size is normal. Tricuspid regurgitation signal is inadequate for assessing PA pressure.  3. The mitral valve is abnormal. Mild mitral valve regurgitation. No evidence of mitral stenosis.  4. The aortic valve is tricuspid. There is moderate calcification of the aortic valve. There is moderate thickening of the aortic valve. Aortic valve regurgitation is not visualized. No aortic stenosis is present. FINDINGS  Left Ventricle: Left ventricular ejection fraction, by estimation, is 40 to 45%. The left ventricle has mildly decreased function. The left ventricle demonstrates global hypokinesis. The left ventricular internal cavity size was normal in size. There is  no left ventricular hypertrophy. Left ventricular diastolic parameters are consistent with Grade I diastolic dysfunction (impaired relaxation). Elevated left atrial pressure. Right Ventricle: The right ventricular size is normal. Right vetricular wall thickness was not well visualized. Right ventricular systolic function is normal. Tricuspid regurgitation signal is inadequate for assessing PA pressure. Left Atrium: Left atrial size was normal in size. Right Atrium: Right atrial size was normal in size. Pericardium: There is no evidence of pericardial effusion. Mitral Valve: The mitral valve is abnormal. There is mild thickening of the mitral valve leaflet(s). There is mild calcification of the mitral valve leaflet(s). Mild mitral annular calcification. Mild mitral valve regurgitation. No evidence of mitral valve stenosis. Tricuspid Valve: The tricuspid valve is not well visualized. Tricuspid valve regurgitation is not demonstrated. No evidence of tricuspid  stenosis. Aortic Valve: The aortic valve is tricuspid. There is moderate calcification of the aortic valve. There is moderate thickening of the aortic valve. There is moderate aortic valve annular calcification. Aortic valve regurgitation is not visualized. No aortic stenosis is present. Aortic valve mean gradient measures 5.2 mmHg. Aortic valve peak gradient measures 9.7 mmHg. Aortic valve area, by VTI measures 1.53 cm. Pulmonic Valve: The pulmonic valve was not well visualized. Pulmonic valve regurgitation is not visualized. No evidence of pulmonic stenosis. Aorta: The aortic root is normal in size and structure. IAS/Shunts: No atrial level shunt detected by color flow Doppler.  LEFT VENTRICLE PLAX 2D LVIDd:         5.10 cm      Diastology LVIDs:         4.30 cm      LV e' medial:    4.46 cm/s LV PW:         1.00 cm      LV E/e' medial:  20.0 LV IVS:        1.10 cm      LV e' lateral:   5.33 cm/s LVOT diam:     1.90 cm      LV E/e' lateral: 16.8 LV SV:         48 LV SV Index:   26 LVOT Area:     2.84 cm  LV Volumes (MOD) LV vol d, MOD A4C: 123.0 ml LV vol s, MOD A4C: 78.6 ml LV SV MOD A4C:     123.0 ml RIGHT VENTRICLE            IVC RV S prime:     9.14 cm/s  IVC diam: 1.50 cm TAPSE (M-mode): 2.2 cm LEFT ATRIUM  Index LA diam:        3.80 cm 2.08 cm/m LA Vol (A2C):   56.9 ml 31.12 ml/m LA Vol (A4C):   42.2 ml 23.08 ml/m LA Biplane Vol: 50.2 ml 27.45 ml/m  AORTIC VALVE AV Area (Vmax):    1.66 cm AV Area (Vmean):   1.46 cm AV Area (VTI):     1.53 cm AV Vmax:           155.37 cm/s AV Vmean:          109.183 cm/s AV VTI:            0.315 m AV Peak Grad:      9.7 mmHg AV Mean Grad:      5.2 mmHg LVOT Vmax:         90.70 cm/s LVOT Vmean:        56.200 cm/s LVOT VTI:          0.170 m LVOT/AV VTI ratio: 0.54  AORTA Ao Root diam: 2.90 cm Ao Asc diam:  3.60 cm MITRAL VALVE MV Area (PHT): 3.72 cm     SHUNTS MV Decel Time: 204 msec     Systemic VTI:  0.17 m MR Peak grad: 91.8 mmHg     Systemic Diam: 1.90  cm MR Mean grad: 58.0 mmHg MR Vmax:      479.00 cm/s MR Vmean:     365.0 cm/s MV E velocity: 89.30 cm/s MV A velocity: 112.00 cm/s MV E/A ratio:  0.80 Carlyle Dolly MD Electronically signed by Carlyle Dolly MD Signature Date/Time: 03/20/2021/1:54:00 PM    Final (Updated)     Cardiac Studies     Patient Profile     56 y.o. female with hx of CAD, DM, HTN, HLD, CKD  Admitted with CP, ST elevation in the inferior leads   Assessment & Plan     CAD :  admitted to [facility] with CP on Jan. 27.  Had inferior ST elevation at the time but apparently was pain free at the time she was seen Was transferred to [facility] yesterday .  Is still pain free  Will check another troponon - I suspect it might be higher  She has global LV dysfunction.    I suspect she has completed her INF. MI but may have other disease  I think she will ultimately need a cath to further define her CAD but we will need to get her creatinine better.    2. DM:  per primary md  3.  Acute on chronic renal insufficiency :  further management per primary team         For questions or updates, please contact [place] Please consult [email or web address] for contact info under        Signed, Mertie Moores, MD  03/21/2021, 9:46 AM

## 2021-03-21 NOTE — Progress Notes (Addendum)
ANTICOAGULATION CONSULT NOTE - Follow Up Consult  Pharmacy Consult for IV heparin Indication: chest pain/ACS  Allergies  Allergen Reactions   Cedar     Swelling    Other     Teriyaki- swelling    Patient Measurements: Height: 5\' 1"  (154.9 cm) Weight: 84.1 kg (185 lb 4.8 oz) IBW/kg (Calculated) : 47.8 Heparin Dosing Weight: 67kg  Vital Signs: Temp: 98.3 F (36.8 C) (01/29 0523) Temp Source: Oral (01/29 0523) BP: 128/73 (01/29 0523) Pulse Rate: 75 (01/29 0523)  Labs: Recent Labs    03/19/21 1728 03/19/21 1846 03/19/21 2249 03/20/21 0255 03/20/21 0446 03/20/21 0610 03/20/21 1403 03/20/21 2031 03/21/21 0611  HGB 11.2*  --   --   --   --   --   --   --  9.8*  HCT 35.2*  --   --   --   --   --   --   --  31.0*  PLT 280  --   --   --   --   --   --   --  260  HEPARINUNFRC  --   --   --   --   --    < > 0.22* <0.10* 0.32  CREATININE 3.35*  --   --  3.25*  --   --   --   --   --   TROPONINIHS 165* 316* 1,410*  --  2,132*  --   --   --   --    < > = values in this interval not displayed.     Estimated Creatinine Clearance: 19 mL/min (A) (by C-G formula based on SCr of 3.25 mg/dL (H)).  Assessment: 73 YOF presenting with chest pain at [facility]. PMH is significant for CAD, MI s/p stent, HTN, DM, CKD Stage IV. No history of anticoagulation prior to admission.   Heparin level 0.32 and therapeutic on 1200 units/h, however at lower end of goal. Will proactively increase by 50 units/h to keep within goal. Hgb is at 9.8 and decreased from 11, platelets are within normal limits. No signs of bleeding noted   Goal of Therapy:  Heparin level 0.3-0.7 units/ml Monitor platelets by anticoagulation protocol: Yes   Plan:  Increase IV heparin gtt to 1250 units/h 8h heparin level Daily heparin level, CBC Monitor for signs and symptoms of bleeding  Thank you for involving pharmacy in this patient's care.  Elita Quick, PharmD PGY1 Ambulatory Care Pharmacy Resident 03/21/2021  7:22 AM  **Pharmacist phone directory can be found on [email or web address] listed under [place]**  ADDENDUM Confirmatory heparin level 0.39 and therapeutic. No signs of bleeding noted.   Plan: Continue IV heparin gtt at 1250 units/h.  Daily heparin level and CBC Monitor for signs and symptoms of bleeding  Thank you for involving pharmacy in this patient's care.  Elita Quick, PharmD PGY1 Ambulatory Care Pharmacy Resident 03/21/2021 2:20 PM  **Pharmacist phone directory can be found on [email or web address] listed under [place]**

## 2021-03-21 NOTE — Progress Notes (Incomplete)
1500 - Report received from previous RN.  All questions answered.  Safety checks performed.  All drips verified. Hand hygiene performed before/after each pt contact. 1600 - Assessment. 1630 - Rx. Pt expressed concern about possible need for heart cath vs her kidney function.  Pt equally concerned about both and feels unable to make a good decision.  Pt's dinner arrived.   1800 - Pt assisted from chair to bed. 1900 - Report received from PM RN.  All questions answered.

## 2021-03-22 DIAGNOSIS — N179 Acute kidney failure, unspecified: Secondary | ICD-10-CM | POA: Diagnosis not present

## 2021-03-22 DIAGNOSIS — N1832 Chronic kidney disease, stage 3b: Secondary | ICD-10-CM

## 2021-03-22 DIAGNOSIS — I214 Non-ST elevation (NSTEMI) myocardial infarction: Secondary | ICD-10-CM | POA: Diagnosis not present

## 2021-03-22 DIAGNOSIS — N184 Chronic kidney disease, stage 4 (severe): Secondary | ICD-10-CM | POA: Diagnosis not present

## 2021-03-22 DIAGNOSIS — I10 Essential (primary) hypertension: Secondary | ICD-10-CM | POA: Diagnosis not present

## 2021-03-22 LAB — CBC
HCT: 27.9 % — ABNORMAL LOW (ref 36.0–46.0)
Hemoglobin: 8.6 g/dL — ABNORMAL LOW (ref 12.0–15.0)
MCH: 31.5 pg (ref 26.0–34.0)
MCHC: 30.8 g/dL (ref 30.0–36.0)
MCV: 102.2 fL — ABNORMAL HIGH (ref 80.0–100.0)
Platelets: 226 10*3/uL (ref 150–400)
RBC: 2.73 MIL/uL — ABNORMAL LOW (ref 3.87–5.11)
RDW: 14.1 % (ref 11.5–15.5)
WBC: 8.3 10*3/uL (ref 4.0–10.5)
nRBC: 0 % (ref 0.0–0.2)

## 2021-03-22 LAB — BASIC METABOLIC PANEL
Anion gap: 8 (ref 5–15)
BUN: 33 mg/dL — ABNORMAL HIGH (ref 6–20)
CO2: 18 mmol/L — ABNORMAL LOW (ref 22–32)
Calcium: 8.5 mg/dL — ABNORMAL LOW (ref 8.9–10.3)
Chloride: 112 mmol/L — ABNORMAL HIGH (ref 98–111)
Creatinine, Ser: 3.36 mg/dL — ABNORMAL HIGH (ref 0.44–1.00)
GFR, Estimated: 15 mL/min — ABNORMAL LOW (ref >60)
Glucose, Bld: 86 mg/dL (ref 70–99)
Potassium: 4 mmol/L (ref 3.5–5.1)
Sodium: 138 mmol/L (ref 135–145)

## 2021-03-22 LAB — GLUCOSE, CAPILLARY
Glucose-Capillary: 78 mg/dL (ref 70–99)
Glucose-Capillary: 107 mg/dL — ABNORMAL HIGH (ref 70–99)
Glucose-Capillary: 201 mg/dL — ABNORMAL HIGH (ref 70–99)
Glucose-Capillary: 143 mg/dL — ABNORMAL HIGH (ref 70–99)
Glucose-Capillary: 170 mg/dL — ABNORMAL HIGH (ref 70–99)

## 2021-03-22 LAB — MAGNESIUM: Magnesium: 2 mg/dL (ref 1.7–2.4)

## 2021-03-22 LAB — HEPARIN LEVEL (UNFRACTIONATED): Heparin Unfractionated: 0.44 IU/mL (ref 0.30–0.70)

## 2021-03-22 MED ORDER — SODIUM CHLORIDE 0.9% FLUSH
3.0000 mL | Freq: Two times a day (BID) | INTRAVENOUS | Status: DC
  Administered 2021-03-22: 18:00:00 3 mL via INTRAVENOUS

## 2021-03-22 MED ORDER — ASPIRIN 81 MG PO CHEW
81.0000 mg | CHEWABLE_TABLET | ORAL | Status: AC
  Administered 2021-03-23: 81 mg via ORAL
  Filled 2021-03-22: qty 1

## 2021-03-22 MED ORDER — SODIUM CHLORIDE 0.9 % IV SOLN: 250.0000 mL | INTRAVENOUS | Status: DC | PRN

## 2021-03-22 MED ORDER — SODIUM CHLORIDE 0.9% FLUSH: 3.0000 mL | INTRAVENOUS | Status: DC | PRN

## 2021-03-22 MED ORDER — SODIUM CHLORIDE 0.9 % IV SOLN: INTRAVENOUS | Status: DC

## 2021-03-22 MED ORDER — SODIUM BICARBONATE 650 MG PO TABS
650.0000 mg | ORAL_TABLET | Freq: Two times a day (BID) | ORAL | Status: DC
  Administered 2021-03-22 – 2021-03-24 (×5): 650 mg via ORAL
  Filled 2021-03-22 (×5): qty 1

## 2021-03-22 NOTE — Care Management (Signed)
°  Transition of Care [facility]) Screening Note   Patient Details  Name: CORLISS COGGESHALL Date of Birth: Jun 06, 1965   Transition of Care [facility]) CM/SW Contact:    Bethena Roys, RN Phone Number: 03/22/2021, 12:20 PM    Transition of Care Department [facility]) has reviewed the patient. Prior to arrival patient was from home alone. Patient states her cousin lives next door and may check in from time to time. Patient states she gets Psychiatrist via [facility] CAP's daily. Patient states she has her medications delivered and she uses [employer] for grocery delivery. No home needs identified by Case Manager. No further needs from Case Manager at this time.

## 2021-03-22 NOTE — Progress Notes (Signed)
Mobility Specialist Progress Note    03/22/21 1217  Mobility  Bed Position Chair  Activity Transferred from bed to chair  Level of Assistance Contact guard assist, steadying assist  Assistive Device  (bed railings)  Distance Ambulated (ft) 8 ft  Activity Response Tolerated well  $Mobility charge 1 Mobility   [facility] Mobility Specialist  M.S. 2C and 6E: [phone] M.S. 4E: [phone]

## 2021-03-22 NOTE — Progress Notes (Signed)
Progress Note  Patient Name: Monica Carlson Date of Encounter: 03/22/2021  [facility] Cardiologist: None   Subjective   No chest pain/burning this morning.   Inpatient Medications    Scheduled Meds:  amLODipine  5 mg Oral Daily   aspirin  325 mg Oral Daily   atorvastatin  80 mg Oral QHS   carvedilol  6.25 mg Oral BID WC   feeding supplement  237 mL Oral BID BM   gabapentin  200 mg Oral BID   insulin aspart  0-15 Units Subcutaneous TID WC   insulin aspart  0-5 Units Subcutaneous QHS   insulin detemir  20 Units Subcutaneous QHS   levothyroxine  125 mcg Oral QAC breakfast   pantoprazole  40 mg Oral Daily   sodium bicarbonate  650 mg Oral BID   Continuous Infusions:  heparin 1,250 Units/hr (03/21/21 1721)   PRN Meds: acetaminophen, ondansetron (ZOFRAN) IV, traZODone   Vital Signs    Vitals:   03/21/21 1645 03/21/21 2103 03/22/21 0500 03/22/21 0557  BP: 139/79   118/71  Pulse: 70 66  65  Resp:    17  Temp:  (!) 97.5 F (36.4 C)  97.8 F (36.6 C)  TempSrc:  Oral  Oral  SpO2:  96%  98%  Weight:   86.2 kg   Height:        Intake/Output Summary (Last 24 hours) at 03/22/2021 0748 Last data filed at 03/22/2021 0646 Gross per 24 hour  Intake 3616.06 ml  Output 9300 ml  Net -5683.94 ml   Last 3 Weights 03/22/2021 03/20/2021 03/19/2021  Weight (lbs) 190 lb 0.6 oz 185 lb 4.8 oz 182 lb  Weight (kg) 86.2 kg 84.052 kg 82.555 kg      Telemetry    SR - Personally Reviewed  ECG    No new tracing  Physical Exam   GEN: No acute distress.   Neck: No JVD Cardiac: RRR, no murmurs, rubs, or gallops.  Respiratory: Clear to auscultation bilaterally. GI: Soft, nontender, non-distended  MS: No edema; No deformity. Neuro:  Nonfocal  Psych: Normal affect   Labs    High Sensitivity Troponin:   Recent Labs  Lab 03/19/21 1728 03/19/21 1846 03/19/21 2249 03/20/21 0446 03/21/21 1012  TROPONINIHS 165* 316* 1,410* 2,132* 885*     Chemistry Recent Labs  Lab  03/19/21 1728 03/20/21 0255 03/21/21 0611 03/22/21 0332  NA 132* 134* 138 138  K 4.6 4.0 3.9 4.0  CL 101 107 111 112*  CO2 22 18* 17* 18*  GLUCOSE 424* 175* 82 86  BUN 41* 37* 33* 33*  CREATININE 3.35* 3.25* 3.41* 3.36*  CALCIUM 9.0 8.8* 8.4* 8.5*  MG  --   --  2.0 2.0  PROT 7.0  --   --   --   ALBUMIN 3.5  --   --   --   AST 8*  --   --   --   ALT 10  --   --   --   ALKPHOS 81  --   --   --   BILITOT <0.1*  --   --   --   GFRNONAA 15* 16* 15* 15*  ANIONGAP 9 9 10 8     Lipids  Recent Labs  Lab 03/20/21 0255  CHOL 240*  TRIG 248*  HDL 36*  LDLCALC 154*  CHOLHDL 6.7    Hematology Recent Labs  Lab 03/19/21 1728 03/21/21 0611 03/22/21 0332  WBC 8.2 8.7 8.3  RBC 3.45* 3.07* 2.73*  HGB 11.2* 9.8* 8.6*  HCT 35.2* 31.0* 27.9*  MCV 102.0* 101.0* 102.2*  MCH 32.5 31.9 31.5  MCHC 31.8 31.6 30.8  RDW 14.1 14.2 14.1  PLT 280 260 226   Thyroid No results for input(s): TSH, FREET4 in the last 168 hours.  BNPNo results for input(s): BNP, PROBNP in the last 168 hours.  DDimer No results for input(s): DDIMER in the last 168 hours.   Radiology    US RENAL  Result Date: 03/20/2021 CLINICAL DATA:  Acute kidney injury. EXAM: RENAL / URINARY TRACT ULTRASOUND COMPLETE COMPARISON:  August 28, 2017. FINDINGS: Right Kidney: Renal measurements: 11.5 x 4.8 x 4.6 cm = volume: 134 mL. Echogenicity within normal limits. No mass or hydronephrosis visualized. Left Kidney: Renal measurements: 9.2 x 5.4 x 4.1 cm = volume: 106 mL. 1 cm simple cyst is noted. Echogenicity within normal limits. No mass or hydronephrosis visualized. Bladder: Appears normal for degree of bladder distention. Other: None. IMPRESSION: No significant renal abnormality is noted. Electronically Signed   By: Marijo Conception M.D.   On: 03/20/2021 11:11   ECHOCARDIOGRAM COMPLETE  Result Date: 03/20/2021    ECHOCARDIOGRAM REPORT   Patient Name:   [name] Date of Exam: 03/20/2021 Medical Rec #:  [id number]    Height:        61.0 in Accession #:    [id number]   Weight:       185.3 lb Date of Birth:  1965-08-29   BSA:          1.828 m Patient Age:    65 years     BP:           131/81 mmHg Patient Gender: F            HR:           69 bpm. Exam Location:  Inpatient Procedure: 2D Echo Indications:     NSTEMI  History:         Patient has prior history of Echocardiogram examinations, most                  recent 07/30/2020. Cardiomyopathy, CAD; Risk                  Factors:Hypertension, Dyslipidemia and Diabetes.  Sonographer:     [place] Referring Phys:  [id number] Abigail Butts Diagnosing Phys: Carlyle Dolly MD IMPRESSIONS  1. Left ventricular ejection fraction, by estimation, is 40 to 45%. The left ventricle has mildly decreased function. The left ventricle demonstrates global hypokinesis. Left ventricular diastolic parameters are consistent with Grade I diastolic dysfunction (impaired relaxation). Elevated left atrial pressure.  2. Right ventricular systolic function is normal. The right ventricular size is normal. Tricuspid regurgitation signal is inadequate for assessing PA pressure.  3. The mitral valve is abnormal. Mild mitral valve regurgitation. No evidence of mitral stenosis.  4. The aortic valve is tricuspid. There is moderate calcification of the aortic valve. There is moderate thickening of the aortic valve. Aortic valve regurgitation is not visualized. No aortic stenosis is present. FINDINGS  Left Ventricle: Left ventricular ejection fraction, by estimation, is 40 to 45%. The left ventricle has mildly decreased function. The left ventricle demonstrates global hypokinesis. The left ventricular internal cavity size was normal in size. There is  no left ventricular hypertrophy. Left ventricular diastolic parameters are consistent with Grade I diastolic dysfunction (impaired relaxation). Elevated left atrial pressure. Right Ventricle: The right ventricular  size is normal. Right vetricular wall thickness was not  well visualized. Right ventricular systolic function is normal. Tricuspid regurgitation signal is inadequate for assessing PA pressure. Left Atrium: Left atrial size was normal in size. Right Atrium: Right atrial size was normal in size. Pericardium: There is no evidence of pericardial effusion. Mitral Valve: The mitral valve is abnormal. There is mild thickening of the mitral valve leaflet(s). There is mild calcification of the mitral valve leaflet(s). Mild mitral annular calcification. Mild mitral valve regurgitation. No evidence of mitral valve stenosis. Tricuspid Valve: The tricuspid valve is not well visualized. Tricuspid valve regurgitation is not demonstrated. No evidence of tricuspid stenosis. Aortic Valve: The aortic valve is tricuspid. There is moderate calcification of the aortic valve. There is moderate thickening of the aortic valve. There is moderate aortic valve annular calcification. Aortic valve regurgitation is not visualized. No aortic stenosis is present. Aortic valve mean gradient measures 5.2 mmHg. Aortic valve peak gradient measures 9.7 mmHg. Aortic valve area, by VTI measures 1.53 cm. Pulmonic Valve: The pulmonic valve was not well visualized. Pulmonic valve regurgitation is not visualized. No evidence of pulmonic stenosis. Aorta: The aortic root is normal in size and structure. IAS/Shunts: No atrial level shunt detected by color flow Doppler.  LEFT VENTRICLE PLAX 2D LVIDd:         5.10 cm      Diastology LVIDs:         4.30 cm      LV e' medial:    4.46 cm/s LV PW:         1.00 cm      LV E/e' medial:  20.0 LV IVS:        1.10 cm      LV e' lateral:   5.33 cm/s LVOT diam:     1.90 cm      LV E/e' lateral: 16.8 LV SV:         48 LV SV Index:   26 LVOT Area:     2.84 cm  LV Volumes (MOD) LV vol d, MOD A4C: 123.0 ml LV vol s, MOD A4C: 78.6 ml LV SV MOD A4C:     123.0 ml RIGHT VENTRICLE            IVC RV S prime:     9.14 cm/s  IVC diam: 1.50 cm TAPSE (M-mode): 2.2 cm LEFT ATRIUM              Index LA diam:        3.80 cm 2.08 cm/m LA Vol (A2C):   56.9 ml 31.12 ml/m LA Vol (A4C):   42.2 ml 23.08 ml/m LA Biplane Vol: 50.2 ml 27.45 ml/m  AORTIC VALVE AV Area (Vmax):    1.66 cm AV Area (Vmean):   1.46 cm AV Area (VTI):     1.53 cm AV Vmax:           155.37 cm/s AV Vmean:          109.183 cm/s AV VTI:            0.315 m AV Peak Grad:      9.7 mmHg AV Mean Grad:      5.2 mmHg LVOT Vmax:         90.70 cm/s LVOT Vmean:        56.200 cm/s LVOT VTI:          0.170 m LVOT/AV VTI ratio: 0.54  AORTA Ao Root diam: 2.90 cm Ao Asc  diam:  3.60 cm MITRAL VALVE MV Area (PHT): 3.72 cm     SHUNTS MV Decel Time: 204 msec     Systemic VTI:  0.17 m MR Peak grad: 91.8 mmHg     Systemic Diam: 1.90 cm MR Mean grad: 58.0 mmHg MR Vmax:      479.00 cm/s MR Vmean:     365.0 cm/s MV E velocity: 89.30 cm/s MV A velocity: 112.00 cm/s MV E/A ratio:  0.80 Carlyle Dolly MD Electronically signed by Carlyle Dolly MD Signature Date/Time: 03/20/2021/1:54:00 PM    Final (Updated)     Cardiac Studies   Echo: 03/20/21   IMPRESSIONS     1. Left ventricular ejection fraction, by estimation, is 40 to 45%. The  left ventricle has mildly decreased function. The left ventricle  demonstrates global hypokinesis. Left ventricular diastolic parameters are  consistent with Grade I diastolic  dysfunction (impaired relaxation). Elevated left atrial pressure.   2. Right ventricular systolic function is normal. The right ventricular  size is normal. Tricuspid regurgitation signal is inadequate for assessing  PA pressure.   3. The mitral valve is abnormal. Mild mitral valve regurgitation. No  evidence of mitral stenosis.   4. The aortic valve is tricuspid. There is moderate calcification of the  aortic valve. There is moderate thickening of the aortic valve. Aortic  valve regurgitation is not visualized. No aortic stenosis is present.   FINDINGS   Left Ventricle: Left ventricular ejection fraction, by estimation, is 40  to 45%.  The left ventricle has mildly decreased function. The left  ventricle demonstrates global hypokinesis. The left ventricular internal  cavity size was normal in size. There is   no left ventricular hypertrophy. Left ventricular diastolic parameters  are consistent with Grade I diastolic dysfunction (impaired relaxation).  Elevated left atrial pressure.   Right Ventricle: The right ventricular size is normal. Right vetricular  wall thickness was not well visualized. Right ventricular systolic  function is normal. Tricuspid regurgitation signal is inadequate for  assessing PA pressure.   Left Atrium: Left atrial size was normal in size.   Right Atrium: Right atrial size was normal in size.   Pericardium: There is no evidence of pericardial effusion.   Mitral Valve: The mitral valve is abnormal. There is mild thickening of  the mitral valve leaflet(s). There is mild calcification of the mitral  valve leaflet(s). Mild mitral annular calcification. Mild mitral valve  regurgitation. No evidence of mitral  valve stenosis.   Tricuspid Valve: The tricuspid valve is not well visualized. Tricuspid  valve regurgitation is not demonstrated. No evidence of tricuspid  stenosis.   Aortic Valve: The aortic valve is tricuspid. There is moderate  calcification of the aortic valve. There is moderate thickening of the  aortic valve. There is moderate aortic valve annular calcification. Aortic  valve regurgitation is not visualized. No  aortic stenosis is present. Aortic valve mean gradient measures 5.2 mmHg.  Aortic valve peak gradient measures 9.7 mmHg. Aortic valve area, by VTI  measures 1.53 cm.   Pulmonic Valve: The pulmonic valve was not well visualized. Pulmonic valve  regurgitation is not visualized. No evidence of pulmonic stenosis.   Aorta: The aortic root is normal in size and structure.   IAS/Shunts: No atrial level shunt detected by color flow Doppler.   Patient Profile     56  y.o. female a PMH of CAD s/p BMS to RCA in 2004, ischemic cardiomyopathy with recovery of EF, chronic combined CHF, HTN, HLD,  DM type II, CVA's, CKD stage IV who is being seen 03/20/2021 for the evaluation of NSTEMI at the request of Dr. Matilde Sprang.  Assessment & Plan    NSTEMI/CAD hx of BMS to RCA '04: hsTn 2132, EKG on admission with inferior ST elevation but was pain free at that time. Serial EKG show continued ST changes in lead III and aVF. Will need to undergo cardiac cath, but timing to be determined given her worsening renal function. Will review with MD. -- remains on IV heparin, ASA, norvasc 10mg  daily, coreg 6.25mg  BID, atorvastatin 80mg  daily    Chronic combined HF: EF noted at 40-45%, global hypokinesis, G1DD. No volume overload on exam. -- on coreg 6.25mg  BID, unable to add ARB/ACE/spiro with renal disease   HTN: stable with current meds  HLD: on atorvastatin 80mg  daily -- LDL 154  DM: Hgb A1c 8.2 -- on SSI  CKD IV: prior baseline 2.1-2.3, now around 3.2-3.3 -- seen by nephrology with question if this is her new baseline.   Hx of CVA: on 325mg  ASA/plavix PTA -- on statin   Anemia: Hgb 11.2>>9.8>>8.6 -- no reported bleeding, did receive 1L bolus and continuous IVFs on admission -- follow CBC  For questions or updates, please contact [place] Please consult [email or web address] for contact info under        Signed, Reino Bellis, NP  03/22/2021, 7:48 AM

## 2021-03-22 NOTE — Progress Notes (Signed)
Patient ID: Monica Carlson, female   DOB: January 29, 1966, 56 y.o.   MRN: [id number]  PROGRESS NOTE    Monica Carlson  Monica Carlson DOB: [birth year] DOA: 03/19/2021 PCP: Gwenlyn Saran, [place]   Brief Narrative:  56 y.o. female with a history of right MCA stroke, coronary artery disease/MI status post stenting, hypertensive cardiomyopathy, diabetes mellitus type 2, chronic kidney disease stage IV presented with chest pain to [facility] on 03/19/2021.  On presentation, creatinine was 3.35, baseline around 2.1-2.3.  Initial troponin was 165, increased to 316 along with EKG changes.  Cardiology recommended to start heparin drip and transferred to [facility] for further evaluation.  Assessment & Plan:   Non-STEMI in a patient with history of CAD and history of stenting -Presented with chest pain, positive troponins and EKG changes -Continue heparin drip.  Continue aspirin, statin and Coreg.  Cardiology following.  Echo showed EF of 40 to 45% with grade 1 diastolic dysfunction -Currently chest pain-free  Chronic combined CHF -Echo as above.  Strict input and output.  Daily weights.  Currently on IV fluids.  Continue Coreg.  Cardiology following.  Negative balance of 2428.3 cc since admission.  Acute kidney injury on chronic kidney disease stage IV Acute metabolic acidosis -Creatinine 3.35 on presentation, baseline around 2.1-2.3.  Creatinine 3.36 on today.  Renal ultrasound was negative for hydronephrosis.  Still acidotic.   -Nephrology following: Has been started on oral sodium bicarbonate tablets and IV fluids have been discontinued  Hyperlipidemia -LDL 154, triglycerides 248 and cholesterol 240.  Continue statin  Diabetes mellitus type 2 with hyperglycemia  -A1c 8.2.  Continue CBGs with SSI along with long-acting insulin  Hypertension -Monitor blood pressure.  Continue amlodipine, Coreg  Anemia of chronic disease Microcytosis -Hemoglobin stable.  Outpatient  follow-up  Hypothyroidism -Continue levothyroxine  Obesity -Outpatient follow-up   DVT prophylaxis: Heparin drip Code Status: Full Family Communication: None at bedside Disposition Plan: Status is: Inpatient  Remains inpatient appropriate because: Of severity of illness and need for further cardiac work-up   Consultants: Cardiology/nephrology  Procedures: Echo as below Antimicrobials: None   Subjective: Patient seen and examined at bedside.  No chest pain, fever, vomiting reported  objective: Vitals:   03/21/21 1645 03/21/21 2103 03/22/21 0500 03/22/21 0557  BP: 139/79   118/71  Pulse: 70 66  65  Resp:    17  Temp:  (!) 97.5 F (36.4 C)  97.8 F (36.6 C)  TempSrc:  Oral  Oral  SpO2:  96%  98%  Weight:   86.2 kg   Height:        Intake/Output Summary (Last 24 hours) at 03/22/2021 0757 Last data filed at 03/22/2021 0646 Gross per 24 hour  Intake 3616.06 ml  Output 9300 ml  Net -5683.94 ml    Filed Weights   03/19/21 1659 03/20/21 0236 03/22/21 0500  Weight: 82.6 kg 84.1 kg 86.2 kg    Examination:  General exam: No distress.  On room air currently. Respiratory system: Bilateral decreased breath sounds at bases with scattered crackles cardiovascular system: S1-S2 heard; currently rate controlled gastrointestinal system: Abdomen is obese, mildly distended; soft and nontender.  Normal bowel sounds heard extremities: No cyanosis; trace lower extremity edema present Central nervous system: Alert and oriented.  no focal neurological deficits.  Moving extremities  skin: No obvious petechiae/rashes  psychiatry: Affect is mostly flat.  No signs of agitation  Data Reviewed: I have personally reviewed following labs and imaging studies  CBC:  Recent Labs  Lab 03/19/21 1728 03/21/21 0611 03/22/21 0332  WBC 8.2 8.7 8.3  NEUTROABS 5.2  --   --   HGB 11.2* 9.8* 8.6*  HCT 35.2* 31.0* 27.9*  MCV 102.0* 101.0* 102.2*  PLT 280 260 585    Basic Metabolic  Panel: Recent Labs  Lab 03/19/21 1728 03/20/21 0255 03/21/21 0611 03/22/21 0332  NA 132* 134* 138 138  K 4.6 4.0 3.9 4.0  CL 101 107 111 112*  CO2 22 18* 17* 18*  GLUCOSE 424* 175* 82 86  BUN 41* 37* 33* 33*  CREATININE 3.35* 3.25* 3.41* 3.36*  CALCIUM 9.0 8.8* 8.4* 8.5*  MG  --   --  2.0 2.0    GFR: Estimated Creatinine Clearance: 18.7 mL/min (A) (by C-G formula based on SCr of 3.36 mg/dL (H)). Liver Function Tests: Recent Labs  Lab 03/19/21 1728  AST 8*  ALT 10  ALKPHOS 81  BILITOT <0.1*  PROT 7.0  ALBUMIN 3.5    No results for input(s): LIPASE, AMYLASE in the last 168 hours. No results for input(s): AMMONIA in the last 168 hours. Coagulation Profile: No results for input(s): INR, PROTIME in the last 168 hours. Cardiac Enzymes: No results for input(s): CKTOTAL, CKMB, CKMBINDEX, TROPONINI in the last 168 hours. BNP (last 3 results) No results for input(s): PROBNP in the last 8760 hours. HbA1C: Recent Labs    03/20/21 0255  HGBA1C 8.2*    CBG: Recent Labs  Lab 03/21/21 0739 03/21/21 1109 03/21/21 1644 03/21/21 2040 03/22/21 0728  GLUCAP 91 193* 102* 135* 78    Lipid Profile: Recent Labs    03/20/21 0255  CHOL 240*  HDL 36*  LDLCALC 154*  TRIG 248*  CHOLHDL 6.7    Thyroid Function Tests: No results for input(s): TSH, T4TOTAL, FREET4, T3FREE, THYROIDAB in the last 72 hours. Anemia Panel: No results for input(s): VITAMINB12, FOLATE, FERRITIN, TIBC, IRON, RETICCTPCT in the last 72 hours. Sepsis Labs: No results for input(s): PROCALCITON, LATICACIDVEN in the last 168 hours.  Recent Results (from the past 240 hour(s))  Resp Panel by RT-PCR (Flu A&B, Covid) Nasopharyngeal Swab     Status: None   Collection Time: 03/19/21  9:10 PM   Specimen: Nasopharyngeal Swab; Nasopharyngeal(NP) swabs in vial transport medium  Result Value Ref Range Status   SARS Coronavirus 2 by RT PCR NEGATIVE NEGATIVE Final    Comment: (NOTE) SARS-CoV-2 target nucleic  acids are NOT DETECTED.  The SARS-CoV-2 RNA is generally detectable in upper respiratory specimens during the acute phase of infection. The lowest concentration of SARS-CoV-2 viral copies this assay can detect is 138 copies/mL. A negative result does not preclude SARS-Cov-2 infection and should not be used as the sole basis for treatment or other patient management decisions. A negative result may occur with  improper specimen collection/handling, submission of specimen other than nasopharyngeal swab, presence of viral mutation(s) within the areas targeted by this assay, and inadequate number of viral copies(<138 copies/mL). A negative result must be combined with clinical observations, patient history, and epidemiological information. The expected result is Negative.  Fact Sheet for Patients:  EntrepreneurPulse.com.au  Fact Sheet for Healthcare Providers:  IncredibleEmployment.be  This test is no t yet approved or cleared by the [place] FDA and  has been authorized for detection and/or diagnosis of SARS-CoV-2 by FDA under an Emergency Use Authorization (EUA). This EUA will remain  in effect (meaning this test can be used) for the duration of the COVID-19 declaration under Section  564(b)(1) of the Act, 21 U.S.C.section 360bbb-3(b)(1), unless the authorization is terminated  or revoked sooner.       Influenza A by PCR NEGATIVE NEGATIVE Final   Influenza B by PCR NEGATIVE NEGATIVE Final    Comment: (NOTE) The Xpert Xpress SARS-CoV-2/FLU/RSV plus assay is intended as an aid in the diagnosis of influenza from Nasopharyngeal swab specimens and should not be used as a sole basis for treatment. Nasal washings and aspirates are unacceptable for Xpert Xpress SARS-CoV-2/FLU/RSV testing.  Fact Sheet for Patients: EntrepreneurPulse.com.au  Fact Sheet for Healthcare Providers: IncredibleEmployment.be  This  test is not yet approved or cleared by the [place] FDA and has been authorized for detection and/or diagnosis of SARS-CoV-2 by FDA under an Emergency Use Authorization (EUA). This EUA will remain in effect (meaning this test can be used) for the duration of the COVID-19 declaration under Section 564(b)(1) of the Act, 21 U.S.C. section 360bbb-3(b)(1), unless the authorization is terminated or revoked.  Performed at [facility], [address] [place], [place] [address]           Radiology Studies: US RENAL  Result Date: 03/20/2021 CLINICAL DATA:  Acute kidney injury. EXAM: RENAL / URINARY TRACT ULTRASOUND COMPLETE COMPARISON:  August 28, 2017. FINDINGS: Right Kidney: Renal measurements: 11.5 x 4.8 x 4.6 cm = volume: 134 mL. Echogenicity within normal limits. No mass or hydronephrosis visualized. Left Kidney: Renal measurements: 9.2 x 5.4 x 4.1 cm = volume: 106 mL. 1 cm simple cyst is noted. Echogenicity within normal limits. No mass or hydronephrosis visualized. Bladder: Appears normal for degree of bladder distention. Other: None. IMPRESSION: No significant renal abnormality is noted. Electronically Signed   By: Marijo Conception M.D.   On: 03/20/2021 11:11   ECHOCARDIOGRAM COMPLETE  Result Date: 03/20/2021    ECHOCARDIOGRAM REPORT   Patient Name:   Monica Carlson Date of Exam: 03/20/2021 Medical Rec #:  [id number]    Height:       61.0 in Accession #:    [id number]   Weight:       185.3 lb Date of Birth:  22-Sep-1965   BSA:          1.828 m Patient Age:    56 years     BP:           131/81 mmHg Patient Gender: F            HR:           69 bpm. Exam Location:  Inpatient Procedure: 2D Echo Indications:     NSTEMI  History:         Patient has prior history of Echocardiogram examinations, most                  recent 07/30/2020. Cardiomyopathy, CAD; Risk                  Factors:Hypertension, Dyslipidemia and Diabetes.  Sonographer:     [place] Referring Phys:  [id number] Abigail Butts Diagnosing Phys: Carlyle Dolly MD IMPRESSIONS  1. Left ventricular ejection fraction, by estimation, is 40 to 45%. The left ventricle has mildly decreased function. The left ventricle demonstrates global hypokinesis. Left ventricular diastolic parameters are consistent with Grade I diastolic dysfunction (impaired relaxation). Elevated left atrial pressure.  2. Right ventricular systolic function is normal. The right ventricular size is normal. Tricuspid regurgitation signal is inadequate for assessing PA pressure.  3. The mitral valve is abnormal. Mild  mitral valve regurgitation. No evidence of mitral stenosis.  4. The aortic valve is tricuspid. There is moderate calcification of the aortic valve. There is moderate thickening of the aortic valve. Aortic valve regurgitation is not visualized. No aortic stenosis is present. FINDINGS  Left Ventricle: Left ventricular ejection fraction, by estimation, is 40 to 45%. The left ventricle has mildly decreased function. The left ventricle demonstrates global hypokinesis. The left ventricular internal cavity size was normal in size. There is  no left ventricular hypertrophy. Left ventricular diastolic parameters are consistent with Grade I diastolic dysfunction (impaired relaxation). Elevated left atrial pressure. Right Ventricle: The right ventricular size is normal. Right vetricular wall thickness was not well visualized. Right ventricular systolic function is normal. Tricuspid regurgitation signal is inadequate for assessing PA pressure. Left Atrium: Left atrial size was normal in size. Right Atrium: Right atrial size was normal in size. Pericardium: There is no evidence of pericardial effusion. Mitral Valve: The mitral valve is abnormal. There is mild thickening of the mitral valve leaflet(s). There is mild calcification of the mitral valve leaflet(s). Mild mitral annular calcification. Mild mitral valve regurgitation. No evidence of mitral valve stenosis.  Tricuspid Valve: The tricuspid valve is not well visualized. Tricuspid valve regurgitation is not demonstrated. No evidence of tricuspid stenosis. Aortic Valve: The aortic valve is tricuspid. There is moderate calcification of the aortic valve. There is moderate thickening of the aortic valve. There is moderate aortic valve annular calcification. Aortic valve regurgitation is not visualized. No aortic stenosis is present. Aortic valve mean gradient measures 5.2 mmHg. Aortic valve peak gradient measures 9.7 mmHg. Aortic valve area, by VTI measures 1.53 cm. Pulmonic Valve: The pulmonic valve was not well visualized. Pulmonic valve regurgitation is not visualized. No evidence of pulmonic stenosis. Aorta: The aortic root is normal in size and structure. IAS/Shunts: No atrial level shunt detected by color flow Doppler.  LEFT VENTRICLE PLAX 2D LVIDd:         5.10 cm      Diastology LVIDs:         4.30 cm      LV e' medial:    4.46 cm/s LV PW:         1.00 cm      LV E/e' medial:  20.0 LV IVS:        1.10 cm      LV e' lateral:   5.33 cm/s LVOT diam:     1.90 cm      LV E/e' lateral: 16.8 LV SV:         48 LV SV Index:   26 LVOT Area:     2.84 cm  LV Volumes (MOD) LV vol d, MOD A4C: 123.0 ml LV vol s, MOD A4C: 78.6 ml LV SV MOD A4C:     123.0 ml RIGHT VENTRICLE            IVC RV S prime:     9.14 cm/s  IVC diam: 1.50 cm TAPSE (M-mode): 2.2 cm LEFT ATRIUM             Index LA diam:        3.80 cm 2.08 cm/m LA Vol (A2C):   56.9 ml 31.12 ml/m LA Vol (A4C):   42.2 ml 23.08 ml/m LA Biplane Vol: 50.2 ml 27.45 ml/m  AORTIC VALVE AV Area (Vmax):    1.66 cm AV Area (Vmean):   1.46 cm AV Area (VTI):     1.53 cm AV Vmax:  155.37 cm/s AV Vmean:          109.183 cm/s AV VTI:            0.315 m AV Peak Grad:      9.7 mmHg AV Mean Grad:      5.2 mmHg LVOT Vmax:         90.70 cm/s LVOT Vmean:        56.200 cm/s LVOT VTI:          0.170 m LVOT/AV VTI ratio: 0.54  AORTA Ao Root diam: 2.90 cm Ao Asc diam:  3.60 cm MITRAL  VALVE MV Area (PHT): 3.72 cm     SHUNTS MV Decel Time: 204 msec     Systemic VTI:  0.17 m MR Peak grad: 91.8 mmHg     Systemic Diam: 1.90 cm MR Mean grad: 58.0 mmHg MR Vmax:      479.00 cm/s MR Vmean:     365.0 cm/s MV E velocity: 89.30 cm/s MV A velocity: 112.00 cm/s MV E/A ratio:  0.80 Carlyle Dolly MD Electronically signed by Carlyle Dolly MD Signature Date/Time: 03/20/2021/1:54:00 PM    Final (Updated)         Scheduled Meds:  amLODipine  5 mg Oral Daily   aspirin  325 mg Oral Daily   atorvastatin  80 mg Oral QHS   carvedilol  6.25 mg Oral BID WC   feeding supplement  237 mL Oral BID BM   gabapentin  200 mg Oral BID   insulin aspart  0-15 Units Subcutaneous TID WC   insulin aspart  0-5 Units Subcutaneous QHS   insulin detemir  20 Units Subcutaneous QHS   levothyroxine  125 mcg Oral QAC breakfast   pantoprazole  40 mg Oral Daily   sodium bicarbonate  650 mg Oral BID   Continuous Infusions:  heparin 1,250 Units/hr (03/21/21 1721)          Aline August, MD [facility] 03/22/2021, 7:57 AM

## 2021-03-22 NOTE — Progress Notes (Signed)
ANTICOAGULATION CONSULT NOTE - Follow Up Consult  Pharmacy Consult for IV heparin Indication: chest pain/ACS  Allergies  Allergen Reactions   Cedar     Swelling    Other     Teriyaki- swelling    Patient Measurements: Height: 5\' 1"  (154.9 cm) Weight: 86.2 kg (190 lb 0.6 oz) IBW/kg (Calculated) : 47.8 Heparin Dosing Weight: 67kg  Vital Signs: Temp: 97.8 F (36.6 C) (01/30 0557) Temp Source: Oral (01/30 0557) BP: 118/71 (01/30 0557) Pulse Rate: 65 (01/30 0557)  Labs: Recent Labs    03/19/21 1728 03/19/21 1846 03/19/21 2249 03/20/21 0255 03/20/21 0446 03/20/21 0610 03/21/21 0611 03/21/21 1012 03/21/21 1329 03/22/21 0332  HGB 11.2*  --   --   --   --   --  9.8*  --   --  8.6*  HCT 35.2*  --   --   --   --   --  31.0*  --   --  27.9*  PLT 280  --   --   --   --   --  260  --   --  226  HEPARINUNFRC  --   --   --   --   --    < > 0.32  --  0.39 0.44  CREATININE 3.35*  --   --  3.25*  --   --  3.41*  --   --  3.36*  TROPONINIHS 165*   < > 1,410*  --  2,132*  --   --  885*  --   --    < > = values in this interval not displayed.     Estimated Creatinine Clearance: 18.7 mL/min (A) (by C-G formula based on SCr of 3.36 mg/dL (H)).  Assessment: Monica Carlson presenting with chest pain. Pt started on IV heparin. No AC PTA.  Heparin level therapeutic, CBC stable.  Goal of Therapy:  Heparin level 0.3-0.7 units/ml Monitor platelets by anticoagulation protocol: Yes   Plan:  Heparin 1250 units/h Daily heparin level and CBC  Arrie Senate, PharmD, [place], [facility] Clinical Pharmacist [phone] Please check AMION for all [facility] Pharmacy numbers 03/22/2021

## 2021-03-22 NOTE — Progress Notes (Signed)
Nutrition Brief Note  Patient identified on the Malnutrition Screening Tool (MST) Report. She reports a few pounds weight loss over the past few months.  No significant weight changes noted on review of usual weights below.  Wt Readings from Last 15 Encounters:  03/22/21 86.2 kg  12/09/20 87.5 kg  11/05/20 90.4 kg  09/07/20 90.5 kg  06/19/20 87.5 kg  05/19/20 86.2 kg  11/19/19 88 kg  07/02/19 88.6 kg  06/25/19 88.5 kg  06/25/19 88.5 kg  06/20/19 88.1 kg  06/07/19 90.3 kg  05/07/19 90.7 kg  05/07/19 90.7 kg  02/01/19 89.8 kg    Body mass index is 35.91 kg/m. Patient meets criteria for obesity based on current BMI.   Current diet order is heart healthy carb modified, patient is consuming approximately 75-100% of meals at this time. Labs and medications reviewed.   No nutrition interventions warranted at this time. If nutrition issues arise, please consult RD.   Lucas Mallow RD, LDN, CNSC Please refer to Amion for contact information.

## 2021-03-22 NOTE — Progress Notes (Signed)
Nephrology Follow-Up Consult note   Assessment/Recommendations: Monica Carlson is a/an 56 y.o. female with a past medical history significant for CAD, ischemic cardiomyopathy, CHF, HTN, DM2, CVA, CKD, admitted for NSTEMI.      CKD IV/V: based on recent outpatient records her Crt baseline is likely 3.3-3.7.  Therefore her current creatinine is likely at her baseline and therefore kidney function is optimized. -Continue to monitor daily Cr, Dose meds for GFR -Monitor Daily I/Os, Daily weight  -Maintain MAP>65 for optimal renal perfusion.  -Avoid nephrotoxic medications including NSAIDs  -Currently no indication for HD -Given there is no AKI we will sign off at this time. -If the patient requires contrast for evaluation of her NSTEMI would consider normal saline 100 cc/h for 10 hours prior to contrast and 5 hours postcontrast.  If the patient develops fluid overload would stop the fluids  NSTEMI: History of CAD.  Currently on medical management with plans for [facility].  Management per cardiology.  Peri-contrast fluids as above  Hypertension: BP currently acceptable  Anemia of CKD: Hemoglobin acceptable  Uncontrolled Diabetes Mellitus Type 2 with Hyperglycemia: Management per primary team  Metabolic acidosis: Bicarbonate 18.  Start sodium bicarb 650 mg twice daily.  Goal serum bicarbonate 22-24.   Recommendations conveyed to primary service.    [facility] 03/22/2021 8:42 AM  ___________________________________________________________  CC: CKD  Interval History/Subjective: Patient feeling fairly well with no SOB or CP.    Medications:  Current Facility-Administered Medications  Medication Dose Route Frequency Provider Last Rate Last Admin   acetaminophen (TYLENOL) tablet 650 mg  650 mg Oral Q4H PRN Truett Mainland, DO   650 mg at 03/21/21 2059   amLODipine (NORVASC) tablet 5 mg  5 mg Oral Daily Truett Mainland, DO   5 mg at 03/21/21 0945   aspirin  EC tablet 325 mg  325 mg Oral Daily Truett Mainland, DO   325 mg at 03/21/21 0950   atorvastatin (LIPITOR) tablet 80 mg  80 mg Oral QHS Roby Lofts M., PA-C   80 mg at 03/21/21 2055   carvedilol (COREG) tablet 6.25 mg  6.25 mg Oral BID WC Roby Lofts M., PA-C   6.25 mg at 03/21/21 1645   feeding supplement (ENSURE ENLIVE / ENSURE PLUS) liquid 237 mL  237 mL Oral BID BM Truett Mainland, DO   237 mL at 03/21/21 1609   gabapentin (NEURONTIN) capsule 200 mg  200 mg Oral BID Aline August, MD   200 mg at 03/21/21 2054   heparin ADULT infusion 100 units/mL (25000 units/278mL)  1,250 Units/hr Intravenous Continuous Wendee Copp, [place] 12.5 mL/hr at 03/21/21 1721 1,250 Units/hr at 03/21/21 1721   insulin aspart (novoLOG) injection 0-15 Units  0-15 Units Subcutaneous TID WC Truett Mainland, DO   3 Units at 03/21/21 1215   insulin aspart (novoLOG) injection 0-5 Units  0-5 Units Subcutaneous QHS Loma Boston J, DO       insulin detemir (LEVEMIR) injection 20 Units  20 Units Subcutaneous QHS Truett Mainland, DO   20 Units at 03/21/21 2055   levothyroxine (SYNTHROID) tablet 125 mcg  125 mcg Oral QAC breakfast Truett Mainland, DO   125 mcg at 03/22/21 0630   ondansetron (ZOFRAN) injection 4 mg  4 mg Intravenous Q6H PRN Truett Mainland, DO       pantoprazole (PROTONIX) EC tablet 40 mg  40 mg Oral Daily Truett Mainland, DO   40  mg at 03/21/21 0945   sodium bicarbonate tablet 650 mg  650 mg Oral BID Reesa Chew, MD       traZODone (DESYREL) tablet 50 mg  50 mg Oral QHS PRN Truett Mainland, DO          Review of Systems: 10 systems reviewed and negative except per interval history/subjective  Physical Exam: Vitals:   03/21/21 2103 03/22/21 0557  BP:  118/71  Pulse: 66 65  Resp:  17  Temp: (!) 97.5 F (36.4 C) 97.8 F (36.6 C)  SpO2: 96% 98%   No intake/output data recorded.  Intake/Output Summary (Last 24 hours) at 03/22/2021 0842 Last data filed at 03/22/2021 5929 Gross  per 24 hour  Intake 3616.06 ml  Output 9300 ml  Net -5683.94 ml   Constitutional: well-appearing, no acute distress ENMT: ears and nose without scars or lesions, MMM CV: normal rate, no edema in ble Respiratory: clear to auscultation, normal work of breathing Gastrointestinal: soft, non-tender, no palpable masses or hernias Skin: no visible lesions or rashes Psych: alert, judgement/insight appropriate, appropriate mood and affect   Test Results I personally reviewed new and old clinical labs and radiology tests Lab Results  Component Value Date   NA 138 03/22/2021   K 4.0 03/22/2021   CL 112 (H) 03/22/2021   CO2 18 (L) 03/22/2021   BUN 33 (H) 03/22/2021   CREATININE 3.36 (H) 03/22/2021   GLU 249 03/18/2020   CALCIUM 8.5 (L) 03/22/2021   ALBUMIN 3.5 03/19/2021

## 2021-03-22 NOTE — H&P (View-Only) (Signed)
Progress Note  Patient Name: Monica Carlson Date of Encounter: 03/22/2021  [facility] Cardiologist: None   Subjective   No chest pain/burning this morning.   Inpatient Medications    Scheduled Meds:  amLODipine  5 mg Oral Daily   aspirin  325 mg Oral Daily   atorvastatin  80 mg Oral QHS   carvedilol  6.25 mg Oral BID WC   feeding supplement  237 mL Oral BID BM   gabapentin  200 mg Oral BID   insulin aspart  0-15 Units Subcutaneous TID WC   insulin aspart  0-5 Units Subcutaneous QHS   insulin detemir  20 Units Subcutaneous QHS   levothyroxine  125 mcg Oral QAC breakfast   pantoprazole  40 mg Oral Daily   sodium bicarbonate  650 mg Oral BID   Continuous Infusions:  heparin 1,250 Units/hr (03/21/21 1721)   PRN Meds: acetaminophen, ondansetron (ZOFRAN) IV, traZODone   Vital Signs    Vitals:   03/21/21 1645 03/21/21 2103 03/22/21 0500 03/22/21 0557  BP: 139/79   118/71  Pulse: 70 66  65  Resp:    17  Temp:  (!) 97.5 F (36.4 C)  97.8 F (36.6 C)  TempSrc:  Oral  Oral  SpO2:  96%  98%  Weight:   86.2 kg   Height:        Intake/Output Summary (Last 24 hours) at 03/22/2021 0748 Last data filed at 03/22/2021 0646 Gross per 24 hour  Intake 3616.06 ml  Output 9300 ml  Net -5683.94 ml   Last 3 Weights 03/22/2021 03/20/2021 03/19/2021  Weight (lbs) 190 lb 0.6 oz 185 lb 4.8 oz 182 lb  Weight (kg) 86.2 kg 84.052 kg 82.555 kg      Telemetry    SR - Personally Reviewed  ECG    No new tracing  Physical Exam   GEN: No acute distress.   Neck: No JVD Cardiac: RRR, no murmurs, rubs, or gallops.  Respiratory: Clear to auscultation bilaterally. GI: Soft, nontender, non-distended  MS: No edema; No deformity. Neuro:  Nonfocal  Psych: Normal affect   Labs    High Sensitivity Troponin:   Recent Labs  Lab 03/19/21 1728 03/19/21 1846 03/19/21 2249 03/20/21 0446 03/21/21 1012  TROPONINIHS 165* 316* 1,410* 2,132* 885*     Chemistry Recent Labs  Lab  03/19/21 1728 03/20/21 0255 03/21/21 0611 03/22/21 0332  NA 132* 134* 138 138  K 4.6 4.0 3.9 4.0  CL 101 107 111 112*  CO2 22 18* 17* 18*  GLUCOSE 424* 175* 82 86  BUN 41* 37* 33* 33*  CREATININE 3.35* 3.25* 3.41* 3.36*  CALCIUM 9.0 8.8* 8.4* 8.5*  MG  --   --  2.0 2.0  PROT 7.0  --   --   --   ALBUMIN 3.5  --   --   --   AST 8*  --   --   --   ALT 10  --   --   --   ALKPHOS 81  --   --   --   BILITOT <0.1*  --   --   --   GFRNONAA 15* 16* 15* 15*  ANIONGAP 9 9 10 8     Lipids  Recent Labs  Lab 03/20/21 0255  CHOL 240*  TRIG 248*  HDL 36*  LDLCALC 154*  CHOLHDL 6.7    Hematology Recent Labs  Lab 03/19/21 1728 03/21/21 0611 03/22/21 0332  WBC 8.2 8.7 8.3  RBC 3.45* 3.07* 2.73*  HGB 11.2* 9.8* 8.6*  HCT 35.2* 31.0* 27.9*  MCV 102.0* 101.0* 102.2*  MCH 32.5 31.9 31.5  MCHC 31.8 31.6 30.8  RDW 14.1 14.2 14.1  PLT 280 260 226   Thyroid No results for input(s): TSH, FREET4 in the last 168 hours.  BNPNo results for input(s): BNP, PROBNP in the last 168 hours.  DDimer No results for input(s): DDIMER in the last 168 hours.   Radiology    US RENAL  Result Date: 03/20/2021 CLINICAL DATA:  Acute kidney injury. EXAM: RENAL / URINARY TRACT ULTRASOUND COMPLETE COMPARISON:  August 28, 2017. FINDINGS: Right Kidney: Renal measurements: 11.5 x 4.8 x 4.6 cm = volume: 134 mL. Echogenicity within normal limits. No mass or hydronephrosis visualized. Left Kidney: Renal measurements: 9.2 x 5.4 x 4.1 cm = volume: 106 mL. 1 cm simple cyst is noted. Echogenicity within normal limits. No mass or hydronephrosis visualized. Bladder: Appears normal for degree of bladder distention. Other: None. IMPRESSION: No significant renal abnormality is noted. Electronically Signed   By: Marijo Conception M.D.   On: 03/20/2021 11:11   ECHOCARDIOGRAM COMPLETE  Result Date: 03/20/2021    ECHOCARDIOGRAM REPORT   Patient Name:   Monica Carlson Date of Exam: 03/20/2021 Medical Rec #:  [id number]    Height:        61.0 in Accession #:    [id number]   Weight:       185.3 lb Date of Birth:  1965/06/23   BSA:          1.828 m Patient Age:    56 years     BP:           131/81 mmHg Patient Gender: F            HR:           69 bpm. Exam Location:  Inpatient Procedure: 2D Echo Indications:     NSTEMI  History:         Patient has prior history of Echocardiogram examinations, most                  recent 07/30/2020. Cardiomyopathy, CAD; Risk                  Factors:Hypertension, Dyslipidemia and Diabetes.  Sonographer:     [place] Referring Phys:  [id number] Abigail Butts Diagnosing Phys: Carlyle Dolly MD IMPRESSIONS  1. Left ventricular ejection fraction, by estimation, is 40 to 45%. The left ventricle has mildly decreased function. The left ventricle demonstrates global hypokinesis. Left ventricular diastolic parameters are consistent with Grade I diastolic dysfunction (impaired relaxation). Elevated left atrial pressure.  2. Right ventricular systolic function is normal. The right ventricular size is normal. Tricuspid regurgitation signal is inadequate for assessing PA pressure.  3. The mitral valve is abnormal. Mild mitral valve regurgitation. No evidence of mitral stenosis.  4. The aortic valve is tricuspid. There is moderate calcification of the aortic valve. There is moderate thickening of the aortic valve. Aortic valve regurgitation is not visualized. No aortic stenosis is present. FINDINGS  Left Ventricle: Left ventricular ejection fraction, by estimation, is 40 to 45%. The left ventricle has mildly decreased function. The left ventricle demonstrates global hypokinesis. The left ventricular internal cavity size was normal in size. There is  no left ventricular hypertrophy. Left ventricular diastolic parameters are consistent with Grade I diastolic dysfunction (impaired relaxation). Elevated left atrial pressure. Right Ventricle: The right ventricular  size is normal. Right vetricular wall thickness was not  well visualized. Right ventricular systolic function is normal. Tricuspid regurgitation signal is inadequate for assessing PA pressure. Left Atrium: Left atrial size was normal in size. Right Atrium: Right atrial size was normal in size. Pericardium: There is no evidence of pericardial effusion. Mitral Valve: The mitral valve is abnormal. There is mild thickening of the mitral valve leaflet(s). There is mild calcification of the mitral valve leaflet(s). Mild mitral annular calcification. Mild mitral valve regurgitation. No evidence of mitral valve stenosis. Tricuspid Valve: The tricuspid valve is not well visualized. Tricuspid valve regurgitation is not demonstrated. No evidence of tricuspid stenosis. Aortic Valve: The aortic valve is tricuspid. There is moderate calcification of the aortic valve. There is moderate thickening of the aortic valve. There is moderate aortic valve annular calcification. Aortic valve regurgitation is not visualized. No aortic stenosis is present. Aortic valve mean gradient measures 5.2 mmHg. Aortic valve peak gradient measures 9.7 mmHg. Aortic valve area, by VTI measures 1.53 cm. Pulmonic Valve: The pulmonic valve was not well visualized. Pulmonic valve regurgitation is not visualized. No evidence of pulmonic stenosis. Aorta: The aortic root is normal in size and structure. IAS/Shunts: No atrial level shunt detected by color flow Doppler.  LEFT VENTRICLE PLAX 2D LVIDd:         5.10 cm      Diastology LVIDs:         4.30 cm      LV e' medial:    4.46 cm/s LV PW:         1.00 cm      LV E/e' medial:  20.0 LV IVS:        1.10 cm      LV e' lateral:   5.33 cm/s LVOT diam:     1.90 cm      LV E/e' lateral: 16.8 LV SV:         48 LV SV Index:   26 LVOT Area:     2.84 cm  LV Volumes (MOD) LV vol d, MOD A4C: 123.0 ml LV vol s, MOD A4C: 78.6 ml LV SV MOD A4C:     123.0 ml RIGHT VENTRICLE            IVC RV S prime:     9.14 cm/s  IVC diam: 1.50 cm TAPSE (M-mode): 2.2 cm LEFT ATRIUM              Index LA diam:        3.80 cm 2.08 cm/m LA Vol (A2C):   56.9 ml 31.12 ml/m LA Vol (A4C):   42.2 ml 23.08 ml/m LA Biplane Vol: 50.2 ml 27.45 ml/m  AORTIC VALVE AV Area (Vmax):    1.66 cm AV Area (Vmean):   1.46 cm AV Area (VTI):     1.53 cm AV Vmax:           155.37 cm/s AV Vmean:          109.183 cm/s AV VTI:            0.315 m AV Peak Grad:      9.7 mmHg AV Mean Grad:      5.2 mmHg LVOT Vmax:         90.70 cm/s LVOT Vmean:        56.200 cm/s LVOT VTI:          0.170 m LVOT/AV VTI ratio: 0.54  AORTA Ao Root diam: 2.90 cm Ao Asc  diam:  3.60 cm MITRAL VALVE MV Area (PHT): 3.72 cm     SHUNTS MV Decel Time: 204 msec     Systemic VTI:  0.17 m MR Peak grad: 91.8 mmHg     Systemic Diam: 1.90 cm MR Mean grad: 58.0 mmHg MR Vmax:      479.00 cm/s MR Vmean:     365.0 cm/s MV E velocity: 89.30 cm/s MV A velocity: 112.00 cm/s MV E/A ratio:  0.80 Carlyle Dolly MD Electronically signed by Carlyle Dolly MD Signature Date/Time: 03/20/2021/1:54:00 PM    Final (Updated)     Cardiac Studies   Echo: 03/20/21   IMPRESSIONS     1. Left ventricular ejection fraction, by estimation, is 40 to 45%. The  left ventricle has mildly decreased function. The left ventricle  demonstrates global hypokinesis. Left ventricular diastolic parameters are  consistent with Grade I diastolic  dysfunction (impaired relaxation). Elevated left atrial pressure.   2. Right ventricular systolic function is normal. The right ventricular  size is normal. Tricuspid regurgitation signal is inadequate for assessing  PA pressure.   3. The mitral valve is abnormal. Mild mitral valve regurgitation. No  evidence of mitral stenosis.   4. The aortic valve is tricuspid. There is moderate calcification of the  aortic valve. There is moderate thickening of the aortic valve. Aortic  valve regurgitation is not visualized. No aortic stenosis is present.   FINDINGS   Left Ventricle: Left ventricular ejection fraction, by estimation, is 40  to 45%.  The left ventricle has mildly decreased function. The left  ventricle demonstrates global hypokinesis. The left ventricular internal  cavity size was normal in size. There is   no left ventricular hypertrophy. Left ventricular diastolic parameters  are consistent with Grade I diastolic dysfunction (impaired relaxation).  Elevated left atrial pressure.   Right Ventricle: The right ventricular size is normal. Right vetricular  wall thickness was not well visualized. Right ventricular systolic  function is normal. Tricuspid regurgitation signal is inadequate for  assessing PA pressure.   Left Atrium: Left atrial size was normal in size.   Right Atrium: Right atrial size was normal in size.   Pericardium: There is no evidence of pericardial effusion.   Mitral Valve: The mitral valve is abnormal. There is mild thickening of  the mitral valve leaflet(s). There is mild calcification of the mitral  valve leaflet(s). Mild mitral annular calcification. Mild mitral valve  regurgitation. No evidence of mitral  valve stenosis.   Tricuspid Valve: The tricuspid valve is not well visualized. Tricuspid  valve regurgitation is not demonstrated. No evidence of tricuspid  stenosis.   Aortic Valve: The aortic valve is tricuspid. There is moderate  calcification of the aortic valve. There is moderate thickening of the  aortic valve. There is moderate aortic valve annular calcification. Aortic  valve regurgitation is not visualized. No  aortic stenosis is present. Aortic valve mean gradient measures 5.2 mmHg.  Aortic valve peak gradient measures 9.7 mmHg. Aortic valve area, by VTI  measures 1.53 cm.   Pulmonic Valve: The pulmonic valve was not well visualized. Pulmonic valve  regurgitation is not visualized. No evidence of pulmonic stenosis.   Aorta: The aortic root is normal in size and structure.   IAS/Shunts: No atrial level shunt detected by color flow Doppler.   Patient Profile     56  y.o. female a PMH of CAD s/p BMS to RCA in 2004, ischemic cardiomyopathy with recovery of EF, chronic combined CHF, HTN, HLD,  DM type II, CVA's, CKD stage IV who is being seen 03/20/2021 for the evaluation of NSTEMI at the request of Dr. Matilde Sprang.  Assessment & Plan    NSTEMI/CAD hx of BMS to RCA '04: hsTn 2132, EKG on admission with inferior ST elevation but was pain free at that time. Serial EKG show continued ST changes in lead III and aVF. Will need to undergo cardiac cath, but timing to be determined given her worsening renal function. Will review with MD. -- remains on IV heparin, ASA, norvasc 10mg  daily, coreg 6.25mg  BID, atorvastatin 80mg  daily    Chronic combined HF: EF noted at 40-45%, global hypokinesis, G1DD. No volume overload on exam. -- on coreg 6.25mg  BID, unable to add ARB/ACE/spiro with renal disease   HTN: stable with current meds  HLD: on atorvastatin 80mg  daily -- LDL 154  DM: Hgb A1c 8.2 -- on SSI  CKD IV: prior baseline 2.1-2.3, now around 3.2-3.3 -- seen by nephrology with question if this is her new baseline.   Hx of CVA: on 325mg  ASA/plavix PTA -- on statin   Anemia: Hgb 11.2>>9.8>>8.6 -- no reported bleeding, did receive 1L bolus and continuous IVFs on admission -- follow CBC  For questions or updates, please contact [place] Please consult [email or web address] for contact info under        Signed, Reino Bellis, NP  03/22/2021, 7:48 AM

## 2021-03-23 ENCOUNTER — Other Ambulatory Visit: Payer: Self-pay | Admitting: Nurse Practitioner

## 2021-03-23 ENCOUNTER — Encounter (HOSPITAL_COMMUNITY)
Admission: EM | Disposition: A | Discharge status: Home / Self Care | Payer: Self-pay | Source: Home / Self Care | Attending: Internal Medicine

## 2021-03-23 ENCOUNTER — Encounter (HOSPITAL_COMMUNITY): Payer: Self-pay | Admitting: Interventional Cardiology

## 2021-03-23 DIAGNOSIS — E1140 Type 2 diabetes mellitus with diabetic neuropathy, unspecified: Secondary | ICD-10-CM

## 2021-03-23 DIAGNOSIS — I2510 Atherosclerotic heart disease of native coronary artery without angina pectoris: Secondary | ICD-10-CM

## 2021-03-23 DIAGNOSIS — I213 ST elevation (STEMI) myocardial infarction of unspecified site: Secondary | ICD-10-CM

## 2021-03-23 HISTORY — PX: LEFT HEART CATH AND CORONARY ANGIOGRAPHY: CATH118249

## 2021-03-23 LAB — CBC
HCT: 30.2 % — ABNORMAL LOW (ref 36.0–46.0)
Hemoglobin: 9.2 g/dL — ABNORMAL LOW (ref 12.0–15.0)
MCH: 30.9 pg (ref 26.0–34.0)
MCHC: 30.5 g/dL (ref 30.0–36.0)
MCV: 101.3 fL — ABNORMAL HIGH (ref 80.0–100.0)
Platelets: 258 10*3/uL (ref 150–400)
RBC: 2.98 MIL/uL — ABNORMAL LOW (ref 3.87–5.11)
RDW: 14.1 % (ref 11.5–15.5)
WBC: 9.5 10*3/uL (ref 4.0–10.5)
nRBC: 0 % (ref 0.0–0.2)

## 2021-03-23 LAB — BASIC METABOLIC PANEL
Anion gap: 8 (ref 5–15)
BUN: 38 mg/dL — ABNORMAL HIGH (ref 6–20)
CO2: 20 mmol/L — ABNORMAL LOW (ref 22–32)
Calcium: 8.9 mg/dL (ref 8.9–10.3)
Chloride: 109 mmol/L (ref 98–111)
Creatinine, Ser: 3.72 mg/dL — ABNORMAL HIGH (ref 0.44–1.00)
GFR, Estimated: 14 mL/min — ABNORMAL LOW (ref >60)
Glucose, Bld: 122 mg/dL — ABNORMAL HIGH (ref 70–99)
Potassium: 4.4 mmol/L (ref 3.5–5.1)
Sodium: 137 mmol/L (ref 135–145)

## 2021-03-23 LAB — GLUCOSE, CAPILLARY
Glucose-Capillary: 111 mg/dL — ABNORMAL HIGH (ref 70–99)
Glucose-Capillary: 122 mg/dL — ABNORMAL HIGH (ref 70–99)
Glucose-Capillary: 149 mg/dL — ABNORMAL HIGH (ref 70–99)
Glucose-Capillary: 126 mg/dL — ABNORMAL HIGH (ref 70–99)
Glucose-Capillary: 101 mg/dL — ABNORMAL HIGH (ref 70–99)

## 2021-03-23 LAB — MAGNESIUM: Magnesium: 2 mg/dL (ref 1.7–2.4)

## 2021-03-23 LAB — HEPARIN LEVEL (UNFRACTIONATED): Heparin Unfractionated: 0.27 IU/mL — ABNORMAL LOW (ref 0.30–0.70)

## 2021-03-23 SURGERY — LEFT HEART CATH AND CORONARY ANGIOGRAPHY
Anesthesia: LOCAL

## 2021-03-23 MED ORDER — SODIUM CHLORIDE 0.9 % IV SOLN: INTRAVENOUS | Status: AC

## 2021-03-23 MED ORDER — MIDAZOLAM HCL 2 MG/2ML IJ SOLN
INTRAMUSCULAR | Status: DC | PRN
  Administered 2021-03-23: 2 mg via INTRAVENOUS

## 2021-03-23 MED ORDER — HEPARIN SODIUM (PORCINE) 1000 UNIT/ML IJ SOLN
INTRAMUSCULAR | Status: DC | PRN
  Administered 2021-03-23: 4000 [IU] via INTRAVENOUS

## 2021-03-23 MED ORDER — HEPARIN (PORCINE) IN NACL 1000-0.9 UT/500ML-% IV SOLN
INTRAVENOUS | Status: AC
  Filled 2021-03-23: qty 500

## 2021-03-23 MED ORDER — LIDOCAINE HCL (PF) 1 % IJ SOLN
INTRAMUSCULAR | Status: AC
  Filled 2021-03-23: qty 30

## 2021-03-23 MED ORDER — SODIUM CHLORIDE 0.9% FLUSH
3.0000 mL | Freq: Two times a day (BID) | INTRAVENOUS | Status: DC
  Administered 2021-03-24: 3 mL via INTRAVENOUS

## 2021-03-23 MED ORDER — SODIUM CHLORIDE 0.9 % IV SOLN
INTRAVENOUS | Status: AC | PRN
  Administered 2021-03-23: 10 mL/h via INTRAVENOUS

## 2021-03-23 MED ORDER — VERAPAMIL HCL 2.5 MG/ML IV SOLN
INTRAVENOUS | Status: AC
  Filled 2021-03-23: qty 2

## 2021-03-23 MED ORDER — MIDAZOLAM HCL 2 MG/2ML IJ SOLN
INTRAMUSCULAR | Status: AC
  Filled 2021-03-23: qty 2

## 2021-03-23 MED ORDER — ONDANSETRON HCL 4 MG/2ML IJ SOLN: 4.0000 mg | Freq: Four times a day (QID) | INTRAMUSCULAR | Status: DC | PRN

## 2021-03-23 MED ORDER — FENTANYL CITRATE (PF) 100 MCG/2ML IJ SOLN
INTRAMUSCULAR | Status: AC
  Filled 2021-03-23: qty 2

## 2021-03-23 MED ORDER — HEPARIN (PORCINE) IN NACL 1000-0.9 UT/500ML-% IV SOLN
INTRAVENOUS | Status: DC | PRN
  Administered 2021-03-23 (×2): 500 mL

## 2021-03-23 MED ORDER — FENTANYL CITRATE (PF) 100 MCG/2ML IJ SOLN
INTRAMUSCULAR | Status: DC | PRN
  Administered 2021-03-23: 25 ug via INTRAVENOUS

## 2021-03-23 MED ORDER — VERAPAMIL HCL 2.5 MG/ML IV SOLN
INTRAVENOUS | Status: DC | PRN
  Administered 2021-03-23 (×2): 10 mL via INTRA_ARTERIAL

## 2021-03-23 MED ORDER — LIDOCAINE HCL (PF) 1 % IJ SOLN
INTRAMUSCULAR | Status: DC | PRN
  Administered 2021-03-23: 2 mL

## 2021-03-23 MED ORDER — HEPARIN SODIUM (PORCINE) 1000 UNIT/ML IJ SOLN
INTRAMUSCULAR | Status: AC
  Filled 2021-03-23: qty 10

## 2021-03-23 MED ORDER — ACETAMINOPHEN 325 MG PO TABS
650.0000 mg | ORAL_TABLET | ORAL | Status: DC | PRN
  Administered 2021-03-24: 650 mg via ORAL
  Filled 2021-03-23: qty 2

## 2021-03-23 MED ORDER — ASPIRIN EC 81 MG PO TBEC
81.0000 mg | DELAYED_RELEASE_TABLET | Freq: Every day | ORAL | Status: DC
  Administered 2021-03-24: 81 mg via ORAL
  Filled 2021-03-23: qty 1

## 2021-03-23 MED ORDER — IOHEXOL 350 MG/ML SOLN
INTRAVENOUS | Status: DC | PRN
  Administered 2021-03-23: 30 mL via INTRA_ARTERIAL

## 2021-03-23 MED ORDER — HEPARIN (PORCINE) 25000 UT/250ML-% IV SOLN
1350.0000 [IU]/h | INTRAVENOUS | Status: DC
  Administered 2021-03-23: 1250 [IU]/h via INTRAVENOUS
  Filled 2021-03-23: qty 250

## 2021-03-23 MED ORDER — HYDRALAZINE HCL 20 MG/ML IJ SOLN: 10.0000 mg | INTRAMUSCULAR | Status: AC | PRN

## 2021-03-23 MED ORDER — LABETALOL HCL 5 MG/ML IV SOLN: 10.0000 mg | INTRAVENOUS | Status: AC | PRN

## 2021-03-23 MED ORDER — SODIUM CHLORIDE 0.9 % IV SOLN: 250.0000 mL | INTRAVENOUS | Status: DC | PRN

## 2021-03-23 MED ORDER — SODIUM CHLORIDE 0.9% FLUSH: 3.0000 mL | INTRAVENOUS | Status: DC | PRN

## 2021-03-23 SURGICAL SUPPLY — 10 items
CATH 5FR JL3.5 JR4 ANG PIG MP (CATHETERS) ×1 IMPLANT
DEVICE RAD COMP TR BAND LRG (VASCULAR PRODUCTS) ×1 IMPLANT
GLIDESHEATH SLEND SS 6F .021 (SHEATH) ×1 IMPLANT
GUIDEWIRE INQWIRE 1.5J.035X260 (WIRE) IMPLANT
INQWIRE 1.5J .035X260CM (WIRE) ×2
KIT HEART LEFT (KITS) ×2 IMPLANT
PACK CARDIAC CATHETERIZATION (CUSTOM PROCEDURE TRAY) ×2 IMPLANT
SHEATH PROBE COVER 6X72 (BAG) ×1 IMPLANT
TRANSDUCER W/STOPCOCK (MISCELLANEOUS) ×2 IMPLANT
TUBING CIL FLEX 10 FLL-RA (TUBING) ×2 IMPLANT

## 2021-03-23 NOTE — Progress Notes (Signed)
ANTICOAGULATION CONSULT NOTE - Follow Up Consult  Pharmacy Consult for IV heparin Indication: chest pain/ACS  Allergies  Allergen Reactions   Cedar     Swelling    Other     Teriyaki- swelling    Patient Measurements: Height: 5\' 1"  (154.9 cm) Weight: 90 kg (198 lb 6.6 oz) IBW/kg (Calculated) : 47.8 Heparin Dosing Weight: 67kg  Vital Signs: Temp: 98.7 F (37.1 C) (01/31 0625) Temp Source: Oral (01/31 0625) BP: 104/65 (01/31 0806) Pulse Rate: 75 (01/31 0806)  Labs: Recent Labs    03/21/21 0611 03/21/21 1012 03/21/21 1329 03/22/21 0332 03/23/21 0317  HGB 9.8*  --   --  8.6* 9.2*  HCT 31.0*  --   --  27.9* 30.2*  PLT 260  --   --  226 258  HEPARINUNFRC 0.32  --  0.39 0.44 0.27*  CREATININE 3.41*  --   --  3.36* 3.72*  TROPONINIHS  --  885*  --   --   --      Estimated Creatinine Clearance: 17.2 mL/min (A) (by C-G formula based on SCr of 3.72 mg/dL (H)).  Assessment: 70 YOF presenting with chest pain. Pt started on IV heparin. No AC PTA.  Pt s/p LHC with plans for CVTS consult. Heparin to resume 8h after sheath pull (~0800).   Goal of Therapy:  Heparin level 0.3-0.7 units/ml Monitor platelets by anticoagulation protocol: Yes   Plan:  Resume heparin 1250 units/h no bolus at 1600 Heparin level with am labs  Arrie Senate, PharmD, BCPS, [facility] Clinical Pharmacist [phone] Please check AMION for all [facility] numbers 03/23/2021

## 2021-03-23 NOTE — Progress Notes (Signed)
Right radial site stable with no complications in TR band removal. Pt tolerated well.

## 2021-03-23 NOTE — Consult Note (Addendum)
[place] [address]       ,[place] [address]             [place] Record #[id number] Date of Birth: [birth year]  Referring: Irish Lack Primary Care: [facility] Primary Cardiologist:None  Chief Complaint:    Chief Complaint  Patient presents with   Chest Pain   History of Present Illness:      Monica Carlson is a 56 yo morbidly obese white female with history of poorly DM, CKD Stg IV/V, CAD S/P PCI in 2004, H/O Ischemic Cardiomyopathy,  HTN, HLD, H/O several TIA/Stroke and Hypothyroidism.  Overall patient is poor historian.  She is very somnolent during evaluation, and this is evidently her baseline. The patient presented to the [facility] ED with complaints of substernal chest pain.  She was brought in via EMS, EKG showed some ST changes with new T wave inversions in the lateral leads.  She was hypertensive with SBP in the 190s.  Cardiology consult was obtained and they felt being patient was asymptomatic on exam CODE STEMI did not need to be initiated.  Troponin levels were elevated and it was felt she should be transferred to [facility] for further evaluation.  The patient was evaluated by Nephrology for CKD.  She remained chest pain free during her stay and was taken to the catheterization lab on 03/23/2021 and underwent cardiac catheterization which showed multivessel CAD.  It was felt coronary bypass grafting would be indicated and TCTS consult was requested.  The patient states she came to the hospital for COVID infection.  She denies chest pain and shortness of breath.  She states she is inactive at home and spends most of her time in bed.  She lives alone and has an aide that helps her with her daily activities.  She is very somnolent on exam and this is evidently her baseline.  She denies smoking history.  She is a poorly controlled diabetic and is not currently on insulin.  She asks if there is  alternative to surgery.    Current Activity/ Functional Status: Patient is not independent with mobility/ambulation, transfers, ADL's, IADL's.   Zubrod Score: At the time of surgery this patients most appropriate activity status/level should be described as: '[]'     0    Normal activity, no symptoms '[]'     1    Restricted in physical strenuous activity but ambulatory, able to do out light work '[]'     2    Ambulatory and capable of self care, unable to do work activities, up and about                 more than 50%  Of the time                            '[x]'     3    Only limited self care, in bed greater than 50% of waking hours '[]'     4    Completely disabled, no self care, confined to bed or chair '[]'     5    Moribund  Past Medical History:  Diagnosis Date   Cervical cancer ([facility])    cryotherapy and colposcopy[place]; 1991   CHF (congestive heart failure) ([place]) 07-2014   Diabetes mellitus without complication ([place]) 6010   High cholesterol    Hypertension  MI (myocardial infarction) ([place])    2004   Stroke [facility]) 09/2014, 2013   Vaginal Pap smear, abnormal     Past Surgical History:  Procedure Laterality Date   CHOLECYSTECTOMY  1992   CORONARY STENT PLACEMENT  2004   LEFT HEART CATH AND CORONARY ANGIOGRAPHY N/A 03/23/2021   Procedure: LEFT HEART CATH AND CORONARY ANGIOGRAPHY;  Surgeon: Jettie Booze, MD;  Location: [place] [facility];  Service: Cardiovascular;  Laterality: N/A;   TUBAL LIGATION      Social History   Tobacco Use  Smoking Status Never  Smokeless Tobacco Never    Social History   Substance and Sexual Activity  Alcohol Use No     Allergies  Allergen Reactions   Cedar     Swelling    Other     Teriyaki- swelling    Current Facility-Administered Medications  Medication Dose Route Frequency Provider Last Rate Last Admin   0.9 %  sodium chloride infusion  250 mL Intravenous PRN Jettie Booze, MD       acetaminophen (TYLENOL) tablet  650 mg  650 mg Oral Q4H PRN Jettie Booze, MD       amLODipine (NORVASC) tablet 5 mg  5 mg Oral Daily Jettie Booze, MD   5 mg at 03/23/21 4128   aspirin EC tablet 81 mg  81 mg Oral Daily Jettie Booze, MD       atorvastatin (LIPITOR) tablet 80 mg  80 mg Oral QHS Jettie Booze, MD   80 mg at 03/22/21 2049   carvedilol (COREG) tablet 6.25 mg  6.25 mg Oral BID WC Jettie Booze, MD   6.25 mg at 03/23/21 0841   feeding supplement (ENSURE ENLIVE / ENSURE PLUS) liquid 237 mL  237 mL Oral BID BM Larae Grooms S, MD   237 mL at 03/22/21 1027   gabapentin (NEURONTIN) capsule 200 mg  200 mg Oral BID Larae Grooms S, MD   200 mg at 03/23/21 0850   heparin ADULT infusion 100 units/mL (25000 units/262m)  1,250 Units/hr Intravenous Continuous BEinar Grad RPH       hydrALAZINE (APRESOLINE) injection 10 mg  10 mg Intravenous Q20 Min PRN VJettie Booze MD       insulin aspart (novoLOG) injection 0-15 Units  0-15 Units Subcutaneous TID WC VJettie Booze MD   2 Units at 03/22/21 1731   insulin aspart (novoLOG) injection 0-5 Units  0-5 Units Subcutaneous QHS VJettie Booze MD       insulin detemir (LEVEMIR) injection 20 Units  20 Units Subcutaneous QHS VJettie Booze MD   20 Units at 03/22/21 2049   labetalol (NORMODYNE) injection 10 mg  10 mg Intravenous Q10 min PRN VJettie Booze MD       levothyroxine (SYNTHROID) tablet 125 mcg  125 mcg Oral QAC breakfast VJettie Booze MD   125 mcg at 03/23/21 0628   ondansetron (ZOFRAN) injection 4 mg  4 mg Intravenous Q6H PRN VJettie Booze MD       pantoprazole (PROTONIX) EC tablet 40 mg  40 mg Oral Daily VJettie Booze MD   40 mg at 03/23/21 07867  sodium bicarbonate tablet 650 mg  650 mg Oral BID VJettie Booze MD   650 mg at 03/23/21 06720  sodium chloride flush (NS) 0.9 % injection 3 mL  3 mL Intravenous Q12H VJettie Booze MD   3 mL at 03/22/21 1731  sodium chloride flush (NS) 0.9 % injection 3 mL  3 mL Intravenous Q12H Jettie Booze, MD       sodium chloride flush (NS) 0.9 % injection 3 mL  3 mL Intravenous PRN Jettie Booze, MD       traZODone (DESYREL) tablet 50 mg  50 mg Oral QHS PRN Jettie Booze, MD        Facility-Administered Medications Prior to Admission  Medication Dose Route Frequency Provider Last Rate Last Admin   glucose blood test strip STRP 1 each  1 each Other BID Cassandria Anger, MD       Medications Prior to Admission  Medication Sig Dispense Refill Last Dose   acetaminophen (TYLENOL) 500 MG tablet Take 1,000-1,500 mg by mouth every 6 (six) hours as needed for mild pain.   Past Week   albuterol (VENTOLIN HFA) 108 (90 Base) MCG/ACT inhaler Inhale 2 puffs into the lungs every 4 (four) hours as needed for shortness of breath.   03/02/2021   amLODipine (NORVASC) 5 MG tablet Take 5 mg by mouth daily.   03/18/2021   aspirin 325 MG EC tablet Take 325 mg by mouth daily.   03/18/2021   carvedilol (COREG) 6.25 MG tablet Take 6.25 mg by mouth 2 (two) times daily.   03/19/2021   Cholecalciferol (VITAMIN D) 50 MCG (2000 UT) CAPS Take 2,000 Units by mouth daily.   03/19/2021   clopidogrel (PLAVIX) 75 MG tablet TAKE ONE TABLET BY MOUTH EVERY DAY WITH BREAKFAST (Patient taking differently: Take 75 mg by mouth daily.) 90 tablet 1 03/19/2021   diclofenac Sodium (VOLTAREN) 1 % GEL Apply 2 g topically 4 (four) times daily as needed (pain/swelling). 2 g 0 unk   furosemide (LASIX) 20 MG tablet Take 20 mg by mouth 2 (two) times daily.   03/19/2021   gabapentin (NEURONTIN) 300 MG capsule Take 300 mg by mouth 2 (two) times daily.   03/19/2021   JANUVIA 50 MG tablet TAKE 1 TABLET BY MOUTH EVERY DAY (Patient taking differently: Take 50 mg by mouth daily.) 90 tablet 0 03/19/2021   levothyroxine (SYNTHROID) 125 MCG tablet TAKE 1 TABLET BY MOUTH DAILY BEFORE BREAKFAST (Patient taking differently: Take 125 mcg by mouth daily before  breakfast.) 90 tablet 0 03/19/2021   meclizine (ANTIVERT) 25 MG tablet Take 1 tablet (25 mg total) by mouth 3 (three) times daily as needed for dizziness. 30 tablet 0 unk   nitroGLYCERIN (NITROSTAT) 0.4 MG SL tablet Place 1 tablet (0.4 mg total) under the tongue every 5 (five) minutes as needed for chest pain. 30 tablet 2 unk   potassium chloride SA (KLOR-CON M) 20 MEQ tablet Take 20 mEq by mouth 2 (two) times daily.   03/19/2021   rosuvastatin (CRESTOR) 20 MG tablet Take 20 mg by mouth daily.   03/18/2021   traZODone (DESYREL) 50 MG tablet Take 50 mg by mouth at bedtime as needed.   03/18/2021   vitamin C (ASCORBIC ACID) 500 MG tablet Take 500 mg by mouth daily.   03/19/2021   Zinc 50 MG TABS Take 50 mg by mouth daily.   03/19/2021   Blood Glucose Monitoring Suppl (ACCU-CHEK GUIDE ME) w/Device KIT 1 Piece by Does not apply route as directed. 1 kit 0    carvedilol (COREG) 3.125 MG tablet Take 1 tablet (3.125 mg total) by mouth 2 (two) times daily with a meal. (Patient not taking: Reported on 03/20/2021) 62 tablet 1 Not Taking   glucose blood (ACCU-CHEK  GUIDE) test strip Use as instructed 4 x daily. e11.65 150 each 2    glucose blood test strip 1 each by Other route 2 (two) times daily. Use as instructed bid. E11.65 200 each 2    Lancets Thin MISC 1 each by Does not apply route 4 (four) times daily. E11.65 Pt has Prodigy meter 150 each 2    pantoprazole (PROTONIX) 40 MG tablet TAKE ONE TABLET BY MOUTH DAILY (Patient not taking: Reported on 03/20/2021) 90 tablet 1 Not Taking   Vitamin D, Ergocalciferol, (DRISDOL) 50000 units CAPS capsule TAKE ONE CAPSULE BY MOUTH ONCE WEEKLY ON MONDAY  0     Family History  Problem Relation Age of Onset   Migraines Mother    Heart disease Father    Hypertension Father    Diabetes Father    Migraines Maternal Grandfather    Heart attack Maternal Grandfather    Lung cancer Paternal Grandmother    Colon cancer Neg Hx    Colon polyps Neg Hx      Review of Systems:        Cardiac Review of Systems: Y or  [    ]= no  Chest Pain [ Y, resolved   ]  Resting SOB [ N  ] Exertional SOB  [ N ]  Orthopnea [  ]   Pedal Edema [ N  ]    Palpitations Aqua.Slicker  ] Syncope  [  ]   Presyncope [   ]  General Review of Systems: [Y] = yes [  ]=no Constitional: recent weight change [  ]; anorexia [  ]; fatigue [  ]; nausea [ N ]; night sweats [  ]; fever [  ]; or chills [  ]                                                               Dental: Last Dentist visit:   Eye : blurred vision [  ]; diplopia [   ]; vision changes [  ];  Amaurosis fugax[  ]; Resp: cough Aqua.Slicker  ];  wheezing[  ];  hemoptysis[  ]; shortness of breath[ N ]; paroxysmal nocturnal dyspnea[  ]; dyspnea on exertion[  ]; or orthopnea[  ];  GI:  gallstones[  ], vomiting[N  ];  dysphagia[  ]; melena[  ];  hematochezia [  ]; heartburn[  ];   Hx of  Colonoscopy[  ]; GU: kidney stones [  ]; hematuria[  ];   dysuria [  ];  nocturia[  ];  history of     obstruction [  ]; urinary frequency [  ]             Skin: rash, swelling[ N ];, hair loss[  ];  peripheral edema[ N ];  or itching[  ]; Musculosketetal: myalgias[  ];  joint swelling[  ];  joint erythema[  ];  joint pain[  ];  back pain[  ];  Heme/Lymph: bruising[  ];  bleeding[  ];  anemia[  ];  Neuro: TIA[ Y  ];  headaches[  ];  stroke[ Y ];  vertigo[  ];  seizures[  ];   paresthesias[  ];  difficulty walking[  ];  Psych:depression[  ]; anxiety[  ];  Endocrine: diabetes[ Y ];  thyroid dysfunction[ Y ];  Physical Exam: BP 99/66    Pulse 72    Temp 98.7 F (37.1 C) (Oral)    Resp 16    Ht '5\' 1"'  (1.549 m)    Wt 90 kg    SpO2 90%    BMI 37.49 kg/m    General appearance: alert, cooperative, and no distress Head: Normocephalic, without obvious abnormality, atraumatic Neck: no adenopathy, no carotid bruit, no JVD, supple, symmetrical, trachea midline, and thyroid not enlarged, symmetric, no tenderness/mass/nodules Resp: clear to auscultation bilaterally Cardio: regular  rate and rhythm GI: soft, non-tender; bowel sounds normal; no masses,  no organomegaly Extremities: extremities normal, atraumatic, no cyanosis or edema Neurologic: patient somnolent, falls I/O of sleep during evaluation  Diagnostic Studies & Laboratory data:     Recent Radiology Findings:   CARDIAC CATHETERIZATION  Result Date: 03/23/2021   Mid RCA lesion is 100% stenosed.  Left to right collaterals.   2nd Mrg lesion is 90% stenosed.   Ost LAD to Mid LAD lesion is 75% stenosed.   1st Diag lesion is 75% stenosed.   LV end diastolic pressure is mildly elevated.   There is no aortic valve stenosis. Severe, multivessel disease.  Diffuse disease throughout the left system.  Occluded mid RCA stent.  Large right PDA and distal RCA system which fills by collaterals from the left.  Restart heparin in 8 hours.  Discussed with Dr. Martinique.  Will obtain CVTS consult.  Contrast minimize due to renal dysfunction. Message left for daughter, Monica Carlson.     I have independently reviewed the above radiologic studies and discussed with the patient   Recent Lab Findings: Lab Results  Component Value Date   WBC 9.5 03/23/2021   HGB 9.2 (L) 03/23/2021   HCT 30.2 (L) 03/23/2021   PLT 258 03/23/2021   GLUCOSE 122 (H) 03/23/2021   CHOL 240 (H) 03/20/2021   TRIG 248 (H) 03/20/2021   HDL 36 (L) 03/20/2021   LDLCALC 154 (H) 03/20/2021   ALT 10 03/19/2021   AST 8 (L) 03/19/2021   NA 137 03/23/2021   K 4.4 03/23/2021   CL 109 03/23/2021   CREATININE 3.72 (H) 03/23/2021   BUN 38 (H) 03/23/2021   CO2 20 (L) 03/23/2021   TSH 16.100 (H) 12/02/2020   HGBA1C 8.2 (H) 03/20/2021    Assessment / Plan:      CAD- H/O CAD, S/P PCI to RCA in 2004, presents with chest pain with r/o for anterior wall STEMI- requesting CABG consult DM- poorly controlled on oral medications, A1c is >8, + neuropathy HTN HLD Morbid Obesity CKD Stage IV/V- creatinine at 3.7 today, nephrology is following, increase risk of dialysis post  surgery  Dispo- patient is symptom free.  She states she came to the ED for COVID infection.  She presented with chest pain and was ruled in for STEMI.  CKD Stage IV/V which would increase risk of post procedure dialysis... Overall patient is a high risk/poor candidate.  She is bed bound more than 50% of the time and has an aide to assist her.  She has previous history of TIA/Stroke and is very somnolent and in and out of sleep during evaluation, which I was told is her baseline.  Dr. Prescott Gum will evaluate patient to determine candidacy for surgery and follow up with additional recommendations.  I  spent 55 minutes counseling the patient face to face.  Ellwood Handler, PA-C 03/23/2021 1:09 PM  Patient examined, images of coronary arteriogram  and 2D echocardiogram personally reviewed and counseled with patient. Patient was transferred from [facility] after presenting with chest pain and positive cardiac enzymes.  She is a sedentary 56 year old obese type II diabetic with stage IV kidney disease with a history of left-sided stroke and left eye blindness 2018.  He had a stent placed in the RCA 15 years ago. Following admission and stabilization she underwent cardiac catheterization showing diffuse diabetic three-vessel disease with poor targets.  Echocardiogram shows EF 40% without significant valvular disease. Patient was recently discharged from the local hospital this month where she was treated for COVID pneumonitis with IV antiviral medicines and supplemental oxygen.  She still feels rundown following this illness and has experienced a weight loss.  Summary the patient is a poor candidate for multivessel CABG due to her significant comorbid renal disease, deconditioning, poor targets for grafting, and recent COVID pneumonitis.  If no PCI options I would treat her medically and I could see her back in the office after she has more time to recover from the COVID pneumonitis.

## 2021-03-23 NOTE — Interval H&P Note (Signed)
Cath Lab Visit (complete for each Cath Lab visit)  Clinical Evaluation Leading to the Procedure:   ACS: Yes.    Non-ACS:    Anginal Classification: CCS IV  Anti-ischemic medical therapy: Minimal Therapy (1 class of medications)  Non-Invasive Test Results: No non-invasive testing performed  Prior CABG: No previous CABG   Renal insufficiency.  Minimize contrast.   History and Physical Interval Note:  03/23/2021 7:38 AM  Monica Carlson  has presented today for surgery, with the diagnosis of chest pain.  The various methods of treatment have been discussed with the patient and family. After consideration of risks, benefits and other options for treatment, the patient has consented to  Procedure(s): LEFT HEART CATH AND CORONARY ANGIOGRAPHY (N/A) as a surgical intervention.  The patient's history has been reviewed, patient examined, no change in status, stable for surgery.  I have reviewed the patient's chart and labs.  Questions were answered to the patient's satisfaction.     Larae Grooms

## 2021-03-23 NOTE — Progress Notes (Signed)
Patient ID: Monica Carlson, female   DOB: 02/24/1965, 56 y.o.   MRN: [id number]  PROGRESS NOTE    JACKLYN BRANAN  QPY:[id number] DOB: Jul 13, 1965 DOA: 03/19/2021 PCP: Gwenlyn Saran, [place]   Brief Narrative:  56 y.o. female with a history of right MCA stroke, coronary artery disease/MI status post stenting, hypertensive cardiomyopathy, diabetes mellitus type 2, chronic kidney disease stage IV presented with chest pain to [facility] on 03/19/2021.  On presentation, creatinine was 3.35, baseline around 2.1-2.3.  Initial troponin was 165, increased to 316 along with EKG changes.  Cardiology recommended to start heparin drip and transferred to [facility] for further evaluation.  Assessment & Plan:   Non-STEMI in a patient with history of CAD and history of stenting -Presented with chest pain, positive troponins and EKG changes -Continue heparin drip.  Continue aspirin, statin and Coreg.  Cardiology following.  Echo showed EF of 40 to 45% with grade 1 diastolic dysfunction -Currently chest pain-free -For cardiac cath today.  Chronic combined CHF -Echo as above.  Strict input and output.  Daily weights.  Continue Coreg.  Cardiology following.  Negative balance of 1398.4 cc since admission.  CKD stage IV/V acute metabolic acidosis - Renal ultrasound was negative for hydronephrosis.   -Nephrology signed off on 03/22/2021: Apparently her recent baseline creatinine is around 3.3-3.7.  Nephrology thought that patient did not have AKI. Continue oral sodium bicarbonate tablet till serum bicarb is 22-24   hyperlipidemia -LDL 154, triglycerides 248 and cholesterol 240.  Continue statin  Diabetes mellitus type 2 with hyperglycemia  -A1c 8.2.  Continue CBGs with SSI along with long-acting insulin  Hypertension -Monitor blood pressure.  Continue amlodipine, Coreg  Anemia of chronic disease Macrocytosis -Hemoglobin stable.  Outpatient follow-up  Hypothyroidism -Continue  levothyroxine  Obesity -Outpatient follow-up   DVT prophylaxis: Heparin drip Code Status: Full Family Communication: None at bedside Disposition Plan: Status is: Inpatient  Remains inpatient appropriate because: Of severity of illness and need for further cardiac work-up   Consultants: Cardiology/nephrology  Procedures: Echo as below Antimicrobials: None   Subjective: Patient seen and examined at bedside.  Denies worsening shortness breath, fever, nausea or vomiting.   Objective: Vitals:   03/22/21 2102 03/23/21 0500 03/23/21 0625 03/23/21 0733  BP: 139/81  111/63   Pulse: 68  83   Resp: 18  18   Temp: 98.3 F (36.8 C)  98.7 F (37.1 C)   TempSrc: Oral  Oral   SpO2: 97%  92% 98%  Weight:  90 kg    Height:        Intake/Output Summary (Last 24 hours) at 03/23/2021 0747 Last data filed at 03/23/2021 0300 Gross per 24 hour  Intake 1479.9 ml  Output 600 ml  Net 879.9 ml    Filed Weights   03/20/21 0236 03/22/21 0500 03/23/21 0500  Weight: 84.1 kg 86.2 kg 90 kg    Examination:  General exam: Currently on room air.  No acute distress. Respiratory system: Decreased breath sounds at bases bilaterally with some crackles  cardiovascular system: Rate controlled currently; S1-S2 heard gastrointestinal system: Abdomen is obese, distended slightly; soft and nontender.  Bowel sounds are heard  extremities: Mild lower extremity edema present; no clubbing  Central nervous system: Awake and alert.  No focal neurological deficits.  Moves extremities  skin: No obvious ecchymosis/lesions psychiatry: Currently not agitated.  Affect is flat  Data Reviewed: I have personally reviewed following labs and imaging studies  CBC: Recent Labs  Lab 03/19/21 1728 03/21/21 0611 03/22/21 0332 03/23/21 0317  WBC 8.2 8.7 8.3 9.5  NEUTROABS 5.2  --   --   --   HGB 11.2* 9.8* 8.6* 9.2*  HCT 35.2* 31.0* 27.9* 30.2*  MCV 102.0* 101.0* 102.2* 101.3*  PLT 280 260 226 258    Basic  Metabolic Panel: Recent Labs  Lab 03/19/21 1728 03/20/21 0255 03/21/21 0611 03/22/21 0332 03/23/21 0317  NA 132* 134* 138 138 137  K 4.6 4.0 3.9 4.0 4.4  CL 101 107 111 112* 109  CO2 22 18* 17* 18* 20*  GLUCOSE 424* 175* 82 86 122*  BUN 41* 37* 33* 33* 38*  CREATININE 3.35* 3.25* 3.41* 3.36* 3.72*  CALCIUM 9.0 8.8* 8.4* 8.5* 8.9  MG  --   --  2.0 2.0 2.0    GFR: Estimated Creatinine Clearance: 17.2 mL/min (A) (by C-G formula based on SCr of 3.72 mg/dL (H)). Liver Function Tests: Recent Labs  Lab 03/19/21 1728  AST 8*  ALT 10  ALKPHOS 81  BILITOT <0.1*  PROT 7.0  ALBUMIN 3.5    No results for input(s): LIPASE, AMYLASE in the last 168 hours. No results for input(s): AMMONIA in the last 168 hours. Coagulation Profile: No results for input(s): INR, PROTIME in the last 168 hours. Cardiac Enzymes: No results for input(s): CKTOTAL, CKMB, CKMBINDEX, TROPONINI in the last 168 hours. BNP (last 3 results) No results for input(s): PROBNP in the last 8760 hours. HbA1C: No results for input(s): HGBA1C in the last 72 hours.  CBG: Recent Labs  Lab 03/22/21 0728 03/22/21 0804 03/22/21 1130 03/22/21 1524 03/22/21 2108  GLUCAP 78 107* 201* 143* 170*    Lipid Profile: No results for input(s): CHOL, HDL, LDLCALC, TRIG, CHOLHDL, LDLDIRECT in the last 72 hours.  Thyroid Function Tests: No results for input(s): TSH, T4TOTAL, FREET4, T3FREE, THYROIDAB in the last 72 hours. Anemia Panel: No results for input(s): VITAMINB12, FOLATE, FERRITIN, TIBC, IRON, RETICCTPCT in the last 72 hours. Sepsis Labs: No results for input(s): PROCALCITON, LATICACIDVEN in the last 168 hours.  Recent Results (from the past 240 hour(s))  Resp Panel by RT-PCR (Flu A&B, Covid) Nasopharyngeal Swab     Status: None   Collection Time: 03/19/21  9:10 PM   Specimen: Nasopharyngeal Swab; Nasopharyngeal(NP) swabs in vial transport medium  Result Value Ref Range Status   SARS Coronavirus 2 by RT PCR  NEGATIVE NEGATIVE Final    Comment: (NOTE) SARS-CoV-2 target nucleic acids are NOT DETECTED.  The SARS-CoV-2 RNA is generally detectable in upper respiratory specimens during the acute phase of infection. The lowest concentration of SARS-CoV-2 viral copies this assay can detect is 138 copies/mL. A negative result does not preclude SARS-Cov-2 infection and should not be used as the sole basis for treatment or other patient management decisions. A negative result may occur with  improper specimen collection/handling, submission of specimen other than nasopharyngeal swab, presence of viral mutation(s) within the areas targeted by this assay, and inadequate number of viral copies(<138 copies/mL). A negative result must be combined with clinical observations, patient history, and epidemiological information. The expected result is Negative.  Fact Sheet for Patients:  EntrepreneurPulse.com.au  Fact Sheet for Healthcare Providers:  IncredibleEmployment.be  This test is no t yet approved or cleared by the [place] FDA and  has been authorized for detection and/or diagnosis of SARS-CoV-2 by FDA under an Emergency Use Authorization (EUA). This EUA will remain  in effect (meaning this test can be used) for the  duration of the COVID-19 declaration under Section 564(b)(1) of the Act, 21 U.S.C.section 360bbb-3(b)(1), unless the authorization is terminated  or revoked sooner.       Influenza A by PCR NEGATIVE NEGATIVE Final   Influenza B by PCR NEGATIVE NEGATIVE Final    Comment: (NOTE) The Xpert Xpress SARS-CoV-2/FLU/RSV plus assay is intended as an aid in the diagnosis of influenza from Nasopharyngeal swab specimens and should not be used as a sole basis for treatment. Nasal washings and aspirates are unacceptable for Xpert Xpress SARS-CoV-2/FLU/RSV testing.  Fact Sheet for Patients: EntrepreneurPulse.com.au  Fact Sheet for  Healthcare Providers: IncredibleEmployment.be  This test is not yet approved or cleared by the [place] FDA and has been authorized for detection and/or diagnosis of SARS-CoV-2 by FDA under an Emergency Use Authorization (EUA). This EUA will remain in effect (meaning this test can be used) for the duration of the COVID-19 declaration under Section 564(b)(1) of the Act, 21 U.S.C. section 360bbb-3(b)(1), unless the authorization is terminated or revoked.  Performed at [facility], [address], [place], [place] [address]           Radiology Studies: No results found.      Scheduled Meds:  [MAR Hold] amLODipine  5 mg Oral Daily   [MAR Hold] aspirin  325 mg Oral Daily   [MAR Hold] atorvastatin  80 mg Oral QHS   [MAR Hold] carvedilol  6.25 mg Oral BID WC   [MAR Hold] feeding supplement  237 mL Oral BID BM   [MAR Hold] gabapentin  200 mg Oral BID   [MAR Hold] insulin aspart  0-15 Units Subcutaneous TID WC   [MAR Hold] insulin aspart  0-5 Units Subcutaneous QHS   [MAR Hold] insulin detemir  20 Units Subcutaneous QHS   [MAR Hold] levothyroxine  125 mcg Oral QAC breakfast   [MAR Hold] pantoprazole  40 mg Oral Daily   [MAR Hold] sodium bicarbonate  650 mg Oral BID   [MAR Hold] sodium chloride flush  3 mL Intravenous Q12H   Continuous Infusions:  sodium chloride     sodium chloride 10 mL/hr (03/23/21 0736)   heparin Stopped (03/23/21 0640)          Aline August, MD [facility] 03/23/2021, 7:47 AM

## 2021-03-23 NOTE — Progress Notes (Signed)
TCTS consulted for CABG evaluation. °

## 2021-03-23 NOTE — Progress Notes (Addendum)
Progress Note  Patient Name: Monica Carlson Date of Encounter: 03/23/2021  [facility] Cardiologist: None   Subjective   No chest pain/burning this morning. Still groggy from sedation during heart cath.   Inpatient Medications    Scheduled Meds:  amLODipine  5 mg Oral Daily   aspirin EC  81 mg Oral Daily   atorvastatin  80 mg Oral QHS   carvedilol  6.25 mg Oral BID WC   feeding supplement  237 mL Oral BID BM   gabapentin  200 mg Oral BID   insulin aspart  0-15 Units Subcutaneous TID WC   insulin aspart  0-5 Units Subcutaneous QHS   insulin detemir  20 Units Subcutaneous QHS   levothyroxine  125 mcg Oral QAC breakfast   pantoprazole  40 mg Oral Daily   sodium bicarbonate  650 mg Oral BID   sodium chloride flush  3 mL Intravenous Q12H   sodium chloride flush  3 mL Intravenous Q12H   Continuous Infusions:  sodium chloride     sodium chloride 75 mL/hr at 03/23/21 0852   heparin     PRN Meds: sodium chloride, acetaminophen, hydrALAZINE, labetalol, ondansetron (ZOFRAN) IV, sodium chloride flush, traZODone   Vital Signs    Vitals:   03/23/21 0801 03/23/21 0806 03/23/21 0838 03/23/21 0841  BP: 107/66 104/65 106/69 108/70  Pulse: 75 75  72  Resp: (!) 23 15    Temp:      TempSrc:      SpO2: 98% 96%    Weight:      Height:        Intake/Output Summary (Last 24 hours) at 03/23/2021 1020 Last data filed at 03/23/2021 0300 Gross per 24 hour  Intake 1379.9 ml  Output 400 ml  Net 979.9 ml    Last 3 Weights 03/23/2021 03/22/2021 03/20/2021  Weight (lbs) 198 lb 6.6 oz 190 lb 0.6 oz 185 lb 4.8 oz  Weight (kg) 90 kg 86.2 kg 84.052 kg      Telemetry    SR - Personally Reviewed  ECG    No new tracing  Physical Exam   GEN: No acute distress.   Neck: No JVD Cardiac: RRR, no murmurs, rubs, or gallops.  Respiratory: Clear to auscultation bilaterally. GI: Soft, nontender, non-distended  MS: No edema; No deformity. Radial band in place.  Neuro:  Nonfocal  Psych:  Normal affect   Labs    High Sensitivity Troponin:   Recent Labs  Lab 03/19/21 1728 03/19/21 1846 03/19/21 2249 03/20/21 0446 03/21/21 1012  TROPONINIHS 165* 316* 1,410* 2,132* 885*      Chemistry Recent Labs  Lab 03/19/21 1728 03/20/21 0255 03/21/21 0611 03/22/21 0332 03/23/21 0317  NA 132*   < > 138 138 137  K 4.6   < > 3.9 4.0 4.4  CL 101   < > 111 112* 109  CO2 22   < > 17* 18* 20*  GLUCOSE 424*   < > 82 86 122*  BUN 41*   < > 33* 33* 38*  CREATININE 3.35*   < > 3.41* 3.36* 3.72*  CALCIUM 9.0   < > 8.4* 8.5* 8.9  MG  --   --  2.0 2.0 2.0  PROT 7.0  --   --   --   --   ALBUMIN 3.5  --   --   --   --   AST 8*  --   --   --   --  ALT 10  --   --   --   --   ALKPHOS 81  --   --   --   --   BILITOT <0.1*  --   --   --   --   GFRNONAA 15*   < > 15* 15* 14*  ANIONGAP 9   < > 10 8 8    < > = values in this interval not displayed.     Lipids  Recent Labs  Lab 03/20/21 0255  CHOL 240*  TRIG 248*  HDL 36*  LDLCALC 154*  CHOLHDL 6.7     Hematology Recent Labs  Lab 03/21/21 0611 03/22/21 0332 03/23/21 0317  WBC 8.7 8.3 9.5  RBC 3.07* 2.73* 2.98*  HGB 9.8* 8.6* 9.2*  HCT 31.0* 27.9* 30.2*  MCV 101.0* 102.2* 101.3*  MCH 31.9 31.5 30.9  MCHC 31.6 30.8 30.5  RDW 14.2 14.1 14.1  PLT 260 226 258    Thyroid No results for input(s): TSH, FREET4 in the last 168 hours.  BNPNo results for input(s): BNP, PROBNP in the last 168 hours.  DDimer No results for input(s): DDIMER in the last 168 hours.   Radiology    CARDIAC CATHETERIZATION  Result Date: 03/23/2021   Mid RCA lesion is 100% stenosed.  Left to right collaterals.   2nd Mrg lesion is 90% stenosed.   Ost LAD to Mid LAD lesion is 75% stenosed.   1st Diag lesion is 75% stenosed.   LV end diastolic pressure is mildly elevated.   There is no aortic valve stenosis. Severe, multivessel disease.  Diffuse disease throughout the left system.  Occluded mid RCA stent.  Large right PDA and distal RCA system which  fills by collaterals from the left.  Restart heparin in 8 hours.  Discussed with Dr. Martinique.  Will obtain CVTS consult.  Contrast minimize due to renal dysfunction. Message left for daughter, Karn Pickler.    Cardiac Studies   Echo: 03/20/21   IMPRESSIONS     1. Left ventricular ejection fraction, by estimation, is 40 to 45%. The  left ventricle has mildly decreased function. The left ventricle  demonstrates global hypokinesis. Left ventricular diastolic parameters are  consistent with Grade I diastolic  dysfunction (impaired relaxation). Elevated left atrial pressure.   2. Right ventricular systolic function is normal. The right ventricular  size is normal. Tricuspid regurgitation signal is inadequate for assessing  PA pressure.   3. The mitral valve is abnormal. Mild mitral valve regurgitation. No  evidence of mitral stenosis.   4. The aortic valve is tricuspid. There is moderate calcification of the  aortic valve. There is moderate thickening of the aortic valve. Aortic  valve regurgitation is not visualized. No aortic stenosis is present.   FINDINGS   Left Ventricle: Left ventricular ejection fraction, by estimation, is 40  to 45%. The left ventricle has mildly decreased function. The left  ventricle demonstrates global hypokinesis. The left ventricular internal  cavity size was normal in size. There is   no left ventricular hypertrophy. Left ventricular diastolic parameters  are consistent with Grade I diastolic dysfunction (impaired relaxation).  Elevated left atrial pressure.   Right Ventricle: The right ventricular size is normal. Right vetricular  wall thickness was not well visualized. Right ventricular systolic  function is normal. Tricuspid regurgitation signal is inadequate for  assessing PA pressure.   Left Atrium: Left atrial size was normal in size.   Right Atrium: Right atrial size was normal in size.  Pericardium: There is no evidence of pericardial effusion.    Mitral Valve: The mitral valve is abnormal. There is mild thickening of  the mitral valve leaflet(s). There is mild calcification of the mitral  valve leaflet(s). Mild mitral annular calcification. Mild mitral valve  regurgitation. No evidence of mitral  valve stenosis.   Tricuspid Valve: The tricuspid valve is not well visualized. Tricuspid  valve regurgitation is not demonstrated. No evidence of tricuspid  stenosis.   Aortic Valve: The aortic valve is tricuspid. There is moderate  calcification of the aortic valve. There is moderate thickening of the  aortic valve. There is moderate aortic valve annular calcification. Aortic  valve regurgitation is not visualized. No  aortic stenosis is present. Aortic valve mean gradient measures 5.2 mmHg.  Aortic valve peak gradient measures 9.7 mmHg. Aortic valve area, by VTI  measures 1.53 cm.   Pulmonic Valve: The pulmonic valve was not well visualized. Pulmonic valve  regurgitation is not visualized. No evidence of pulmonic stenosis.   Aorta: The aortic root is normal in size and structure.   IAS/Shunts: No atrial level shunt detected by color flow Doppler.   Cardiac cath: Conclusion      Mid RCA lesion is 100% stenosed.  Left to right collaterals.   2nd Mrg lesion is 90% stenosed.   Ost LAD to Mid LAD lesion is 75% stenosed.   1st Diag lesion is 75% stenosed.   LV end diastolic pressure is mildly elevated.   There is no aortic valve stenosis.   Severe, multivessel disease.  Diffuse disease throughout the left system.  Occluded mid RCA stent.  Large right PDA and distal RCA system which fills by collaterals from the left.  Restart heparin in 8 hours.  Discussed with Dr. Martinique.  Will obtain CVTS consult.  Contrast minimize due to renal dysfunction. Diagnostic Dominance: Right Intervention  Patient Profile     55 y.o. female a PMH of CAD s/p BMS to RCA in 2004, ischemic cardiomyopathy with recovery of EF, chronic combined CHF,  HTN, HLD, DM type II, CVA's, CKD stage IV who is being seen 03/20/2021 for the evaluation of NSTEMI at the request of Dr. Matilde Sprang.  Assessment & Plan    Inferior STEMI/CAD hx of BMS to RCA '04: hsTn 2132, EKG on admission with inferior ST elevation but was pain free at that time. Serial EKG show continued ST changes in lead III and aVF. RCA occluded on cath with collaterals. Severe disease in LCA system. Not a candidate for PCI. Recommend CT surgery consult for CABG. Given LV dysfunction and reduced EF would be her best option.  -- will resume IV heparin, continue ASA, norvasc 10mg  daily, coreg 6.25mg  BID, atorvastatin 80mg  daily    2. Chronic combined HF: EF noted at 40-45%, global hypokinesis, G1DD. No volume overload on exam. EDP 22 mm Hg at cath post hydration.  -- on coreg 6.25mg  BID, unable to add ARB/ACE/spiro with renal disease   3. HTN: stable with current meds  4. HLD: on atorvastatin 80mg  daily -- LDL 154  5. DM: Hgb A1c 8.2 -- on SSI  6. CKD IV: prior baseline 2.1-2.3, now around 3.2-3.3 stage IV -- seen by nephrology with question if this is her new baseline.  -- hydrated for cath. Only 30 cc of contrast used. EDP 22 mmHg so tank is full.  7. Hx of CVA: on 325mg  ASA/plavix PTA -- on statin  -- has been off plavix since admission  8. Anemia: Hgb  11.2>>9.8>>8.6>> 9.2 -- no reported bleeding, did receive 1L bolus and continuous IVFs on admission -- follow CBC  For questions or updates, please contact [place] Please consult [email or web address] for contact info under        Signed, Akeema Broder Martinique, MD  03/23/2021, 10:20 AM

## 2021-03-24 ENCOUNTER — Inpatient Hospital Stay (HOSPITAL_COMMUNITY): Payer: Medicare Other

## 2021-03-24 DIAGNOSIS — N179 Acute kidney failure, unspecified: Secondary | ICD-10-CM

## 2021-03-24 LAB — BASIC METABOLIC PANEL
Anion gap: 9 (ref 5–15)
BUN: 38 mg/dL — ABNORMAL HIGH (ref 6–20)
CO2: 18 mmol/L — ABNORMAL LOW (ref 22–32)
Calcium: 8.6 mg/dL — ABNORMAL LOW (ref 8.9–10.3)
Chloride: 111 mmol/L (ref 98–111)
Creatinine, Ser: 3.81 mg/dL — ABNORMAL HIGH (ref 0.44–1.00)
GFR, Estimated: 13 mL/min — ABNORMAL LOW (ref >60)
Glucose, Bld: 107 mg/dL — ABNORMAL HIGH (ref 70–99)
Potassium: 3.8 mmol/L (ref 3.5–5.1)
Sodium: 138 mmol/L (ref 135–145)

## 2021-03-24 LAB — CBC
HCT: 28 % — ABNORMAL LOW (ref 36.0–46.0)
Hemoglobin: 8.8 g/dL — ABNORMAL LOW (ref 12.0–15.0)
MCH: 31.9 pg (ref 26.0–34.0)
MCHC: 31.4 g/dL (ref 30.0–36.0)
MCV: 101.4 fL — ABNORMAL HIGH (ref 80.0–100.0)
Platelets: 213 10*3/uL (ref 150–400)
RBC: 2.76 MIL/uL — ABNORMAL LOW (ref 3.87–5.11)
RDW: 14.2 % (ref 11.5–15.5)
WBC: 9.5 10*3/uL (ref 4.0–10.5)
nRBC: 0 % (ref 0.0–0.2)

## 2021-03-24 LAB — HEPARIN LEVEL (UNFRACTIONATED): Heparin Unfractionated: 0.12 IU/mL — ABNORMAL LOW (ref 0.30–0.70)

## 2021-03-24 LAB — MAGNESIUM: Magnesium: 2 mg/dL (ref 1.7–2.4)

## 2021-03-24 LAB — GLUCOSE, CAPILLARY
Glucose-Capillary: 113 mg/dL — ABNORMAL HIGH (ref 70–99)
Glucose-Capillary: 155 mg/dL — ABNORMAL HIGH (ref 70–99)

## 2021-03-24 MED ORDER — HEPARIN SODIUM (PORCINE) 5000 UNIT/ML IJ SOLN: 5000.0000 [IU] | Freq: Three times a day (TID) | INTRAMUSCULAR | Status: DC

## 2021-03-24 MED ORDER — ASPIRIN 81 MG PO TBEC: 81.0000 mg | DELAYED_RELEASE_TABLET | Freq: Every day | ORAL | 2 refills | Status: AC

## 2021-03-24 MED ORDER — CLOPIDOGREL BISULFATE 75 MG PO TABS
75.0000 mg | ORAL_TABLET | Freq: Every day | ORAL | Status: DC
  Administered 2021-03-24: 75 mg via ORAL
  Filled 2021-03-24: qty 1

## 2021-03-24 MED ORDER — SODIUM BICARBONATE 650 MG PO TABS: 650.0000 mg | ORAL_TABLET | Freq: Two times a day (BID) | ORAL | 2 refills | Status: AC

## 2021-03-24 MED ORDER — ATORVASTATIN CALCIUM 80 MG PO TABS: 80.0000 mg | ORAL_TABLET | Freq: Every day | ORAL | 2 refills | Status: AC

## 2021-03-24 NOTE — Care Management Important Message (Signed)
Important Message  Patient Details  Name: Monica Carlson MRN: [id number] Date of Birth: 1965/05/05   Medicare Important Message Given:  Yes     Shelda Altes 03/24/2021, 1:10 PM

## 2021-03-24 NOTE — Discharge Summary (Signed)
Physician Discharge Summary   Patient: Monica Carlson MRN: [id number] DOB: 21-Nov-1965  Admit date:     03/19/2021  Discharge date: 03/24/21  Discharge Physician: Annaleigh Steinmeyer J [place]   PCP: Gwenlyn Saran, [facility]   Recommendations at discharge:   Follow-up with PCP in 1 week following hospitalization Follow-up with cardiology, Dr. Harl Bowie in 1-2 weeks Follow-up with nephrology in 1-2 weeks Follow-up with cardiothoracic surgery, Dr. Lawson Fiscal in 3 weeks Recommend repeat BMP in next PCP/specialist visit to assess renal function  Discharge Diagnoses: Principal Problem:   NSTEMI (non-ST elevated myocardial infarction) [facility]) Active Problems:   Essential hypertension, benign   Stented coronary artery   History of MI (myocardial infarction)   Hypothyroidism   Mixed hyperlipidemia   Uncontrolled type 2 diabetes mellitus with hyperglycemia ([place])   History of right MCA stroke   Acute myocardial infarction ([place])   AKI (acute kidney injury) ([place])  Resolved Problems:   * No resolved hospital problems. *   Hospital Course:  Monica Carlson is a 56 year old female with past medical history significant for right MCA CVA, CAD/MI s/p PCI/stent, hypertensive cardiomyopathy, type 2 diabetes mellitus, CKD stage IV who presented to [facility] on 1/27 with chest pain.  Patient was noted to have an elevated troponin of 165 with subsequent troponin 316 and EKG changes.  Cardiology was consulted and recommended initiation of heparin drip and transition for to [facility] for further evaluation.  Hospitalist service consulted for further evaluation management.  Assessment and Plan:   STEMI Hx CAD s/p BMS RCA 2004 Patient initially presented to [facility] with chest pain.  EKG on admission showed inferior ST elevation but patient was pain-free at that time.  Patient with elevated troponin and was transferred to [facility] for further cardiology evaluation.   Patient underwent left heart catheterization on 03/23/2021 with occluded RCA with collaterals, severe disease in the LCA and not a candidate for PCI.  Was evaluated by cardiothoracic surgery for possible CABG, given patient's deconditioning, chronical medical issues and recent COVID-19 infection, recommend medical treatment for now and reevaluation as outpatient.  Continue aspirin, Coreg, statin.  Outpatient follow-up with cardiology, cardiothoracic surgery.  Chronic combined diastolic/systolic congestive heart failure TTE with LVEF 40-45%, global hypokinesis, grade 1 diastolic dysfunction.  Patient is euvolemic.  Continue Coreg, furosemide.  Unable to add ARB/ACE inhibitor/spironolactone given her underlying renal insufficiency.  Outpatient follow-up with cardiology.  CKD stage IV/V Acute metabolic acidosis Renal ultrasound unrevealing, no hydronephrosis, likely medical renal disease.  Nephrology was consulted and followed during hospital course.  Started on oral sodium bicarbonate tablets 650 mg p.o. twice daily.  Baseline creatinine 3.3-3.7.  Recommend repeat BMP 1 week.  Outpatient follow-up with nephrology.  Hyperlipidemia LDL 154, triglycerides 248, total cholesterol 240.  Continue statin.  Type 2 diabetes mellitus, with hyperglycemia Hemoglobin A1c 8.2.  Continue home Januvia 50 mg p.o. daily, glipizide 10 mg p.o. BID.  Outpatient follow-up with PCP for further guidance.  Anemia of chronic disease Hemoglobin 8.8 at time of discharge.  Outpatient follow-up with PCP.  Hypothyroidism: Continue levothyroxine 125 mcg p.o. daily  Obesity Body mass index is 36.45 kg/m.  Discussed with patient needs for aggressive lifestyle changes/weight loss as this complicates all facets of care.  Outpatient follow-up with PCP.           Consultants: Cardiology, nephrology, cardiothoracic surgery Procedures performed: TTE, left heart catheterization Disposition: Home Diet recommendation:  Discharge  Diet Orders (From admission, onward)  Start     Ordered   03/24/21 0000  Diet - low sodium heart healthy        03/24/21 1104           Cardiac and Carb modified diet  DISCHARGE MEDICATION: Allergies as of 03/24/2021       Reactions   Cedar    Swelling   Other    Teriyaki- swelling        Medication List     STOP taking these medications    pantoprazole 40 MG tablet Commonly known as: PROTONIX   rosuvastatin 20 MG tablet Commonly known as: CRESTOR       TAKE these medications    Accu-Chek Guide Me w/Device Kit 1 Piece by Does not apply route as directed.   acetaminophen 500 MG tablet Commonly known as: TYLENOL Take 1,000-1,500 mg by mouth every 6 (six) hours as needed for mild pain.   albuterol 108 (90 Base) MCG/ACT inhaler Commonly known as: VENTOLIN HFA Inhale 2 puffs into the lungs every 4 (four) hours as needed for shortness of breath.   amLODipine 5 MG tablet Commonly known as: NORVASC Take 5 mg by mouth daily.   aspirin 81 MG EC tablet Take 1 tablet (81 mg total) by mouth daily. Swallow whole. Start taking on: March 25, 2021 What changed:  medication strength how much to take additional instructions   atorvastatin 80 MG tablet Commonly known as: LIPITOR Take 1 tablet (80 mg total) by mouth at bedtime.   carvedilol 6.25 MG tablet Commonly known as: COREG Take 6.25 mg by mouth 2 (two) times daily. What changed: Another medication with the same name was removed. Continue taking this medication, and follow the directions you see here.   clopidogrel 75 MG tablet Commonly known as: PLAVIX TAKE ONE TABLET BY MOUTH EVERY DAY WITH BREAKFAST What changed: See the new instructions.   diclofenac Sodium 1 % Gel Commonly known as: VOLTAREN Apply 2 g topically 4 (four) times daily as needed (pain/swelling).   furosemide 20 MG tablet Commonly known as: LASIX Take 20 mg by mouth 2 (two) times daily.   gabapentin 300 MG capsule Commonly  known as: NEURONTIN Take 300 mg by mouth 2 (two) times daily.   glipiZIDE 10 MG tablet Commonly known as: GLUCOTROL TAKE 1 TABLET BY MOUTH TWICE DAILY BEFORE MEALS   glucose blood test strip Commonly known as: Accu-Chek Guide Use as instructed 4 x daily. e11.65   glucose blood test strip 1 each by Other route 2 (two) times daily. Use as instructed bid. E11.65   Januvia 50 MG tablet Generic drug: sitaGLIPtin TAKE 1 TABLET BY MOUTH EVERY DAY What changed: how much to take   Lancets Thin Misc 1 each by Does not apply route 4 (four) times daily. E11.65 Pt has Prodigy meter   levothyroxine 125 MCG tablet Commonly known as: SYNTHROID TAKE 1 TABLET BY MOUTH DAILY BEFORE BREAKFAST   meclizine 25 MG tablet Commonly known as: ANTIVERT Take 1 tablet (25 mg total) by mouth 3 (three) times daily as needed for dizziness.   nitroGLYCERIN 0.4 MG SL tablet Commonly known as: NITROSTAT Place 1 tablet (0.4 mg total) under the tongue every 5 (five) minutes as needed for chest pain.   potassium chloride SA 20 MEQ tablet Commonly known as: KLOR-CON M Take 20 mEq by mouth 2 (two) times daily.   sodium bicarbonate 650 MG tablet Take 1 tablet (650 mg total) by mouth 2 (two) times daily.  traZODone 50 MG tablet Commonly known as: DESYREL Take 50 mg by mouth at bedtime as needed.   vitamin C 500 MG tablet Commonly known as: ASCORBIC ACID Take 500 mg by mouth daily.   Vitamin D (Ergocalciferol) 1.25 MG (50000 UNIT) Caps capsule Commonly known as: DRISDOL TAKE ONE CAPSULE BY MOUTH ONCE WEEKLY ON MONDAY   Vitamin D 50 MCG (2000 UT) Caps Take 2,000 Units by mouth daily.   Zinc 50 MG Tabs Take 50 mg by mouth daily.        [place], [facility]. Schedule an appointment as soon as possible for a visit in 1 week(s).   Contact information: [place] [address] [phone]         Arnoldo Lenis, MD. Schedule an  appointment as soon as possible for a visit in 1 week(s).   Specialty: Cardiology Contact information: [address] [place] [address] [phone]         Liana Gerold, MD. Schedule an appointment as soon as possible for a visit in 1 week(s).   Specialty: Nephrology Contact information: [address] [address] [phone]                 Discharge Exam: Filed Weights   03/22/21 0500 03/23/21 0500 03/24/21 0550  Weight: 86.2 kg 90 kg 87.5 kg   Physical Exam GEN: 56 yo female in NAD, alert and oriented x 3, wd/wn, elderly in appearance HEENT: NCAT, PERRL, EOMI, sclera clear, MMM PULM: CTAB w/o wheezes/crackles, normal respiratory effort, on room air CV: RRR w/o M/G/R, no JVD GI: abd soft, NTND, NABS, no R/G/M MSK: no peripheral edema, muscle strength globally intact 5/5 bilateral upper/lower extremities NEURO: CN II-XII intact, no focal deficits, sensation to light touch intact PSYCH: normal mood/affect Integumentary: dry/intact, no rashes or wounds   Condition at discharge: stable  The results of significant diagnostics from this hospitalization (including imaging, microbiology, ancillary and laboratory) are listed below for reference.   Imaging Studies: CT chest w/o contrast  Result Date: 03/24/2021 CLINICAL DATA:  Respiratory illness, nondiagnostic x-ray. History of recent COVID pneumonitis. EXAM: CT CHEST WITHOUT CONTRAST TECHNIQUE: Multidetector CT imaging of the chest was performed following the standard protocol without IV contrast. RADIATION DOSE REDUCTION: This exam was performed according to the departmental dose-optimization program which includes automated exposure control, adjustment of the mA and/or kV according to patient size and/or use of iterative reconstruction technique. COMPARISON:  03/19/2021. FINDINGS: Cardiovascular: The heart is enlarged and there is no pericardial effusion. Multi-vessel coronary artery  calcifications are noted. There is atherosclerotic calcification of the aorta without evidence of aneurysm. The pulmonary trunk is normal in caliber. Mediastinum/Nodes: Prominent lymph node is present in the right paratracheal space measuring 1.1 cm in short axis diameter. Evaluation of the hila is limited due to lack of IV contrast. No axillary lymphadenopathy. The thyroid gland, trachea, and esophagus are within normal limits. Lungs/Pleura: There is elevation of the right diaphragm with atelectasis at the lung bases bilaterally. Mild hazy ground-glass opacities are noted in the lungs. No effusion or pneumothorax Upper Abdomen: The gallbladder is surgically absent. There is a wedge shaped hypodense region in the posterior aspect of the spleen. Musculoskeletal: Degenerative changes are present in the thoracic spine. No acute osseous abnormality. IMPRESSION: 1. Mild hazy ground-glass opacities in the lungs, possible edema or residual pneumonitis. 2. Elevation of the right diaphragm with atelectasis at the lung  bases bilaterally. 3. Cardiomegaly with multi-vessel coronary artery calcifications. 4. Wedge-shaped hypodense region in the posterior aspect of the spleen, possible splenic infarct of indeterminate age. Comparison with older imaging studies or CT abdomen may be beneficial for further evaluation. 5. Aortic atherosclerosis. Electronically Signed   By: Brett Fairy M.D.   On: 03/24/2021 02:16   CARDIAC CATHETERIZATION  Result Date: 03/23/2021   Mid RCA lesion is 100% stenosed.  Left to right collaterals.   2nd Mrg lesion is 90% stenosed.   Ost LAD to Mid LAD lesion is 75% stenosed.   1st Diag lesion is 75% stenosed.   LV end diastolic pressure is mildly elevated.   There is no aortic valve stenosis. Severe, multivessel disease.  Diffuse disease throughout the left system.  Occluded mid RCA stent.  Large right PDA and distal RCA system which fills by collaterals from the left.  Restart heparin in 8 hours.   Discussed with Dr. [place].  Will obtain CVTS consult.  Contrast minimize due to renal dysfunction. Message left for daughter, Karn Pickler.   US RENAL  Result Date: 03/20/2021 CLINICAL DATA:  Acute kidney injury. EXAM: RENAL / URINARY TRACT ULTRASOUND COMPLETE COMPARISON:  August 28, 2017. FINDINGS: Right Kidney: Renal measurements: 11.5 x 4.8 x 4.6 cm = volume: 134 mL. Echogenicity within normal limits. No mass or hydronephrosis visualized. Left Kidney: Renal measurements: 9.2 x 5.4 x 4.1 cm = volume: 106 mL. 1 cm simple cyst is noted. Echogenicity within normal limits. No mass or hydronephrosis visualized. Bladder: Appears normal for degree of bladder distention. Other: None. IMPRESSION: No significant renal abnormality is noted. Electronically Signed   By: Marijo Conception M.D.   On: 03/20/2021 11:11   DG Chest Portable 1 View  Result Date: 03/19/2021 CLINICAL DATA:  Chest pain EXAM: PORTABLE CHEST 1 VIEW COMPARISON:  Chest radiograph 02/24/2021 FINDINGS: The heart is at the upper limits of normal for size, unchanged. The upper mediastinal contours are stable. There is asymmetric elevation of the right hemidiaphragm, unchanged. There is no focal consolidation or pulmonary edema. There is no pleural effusion or pneumothorax. Previously seen interstitial prominence seen on the study from 02/24/2021 has resolved. There is no acute osseous abnormality. IMPRESSION: 1. Previously seen interstitial prominence on the study from 02/24/2021 has resolved. No radiographic evidence of acute cardiopulmonary process on the current study. 2. Borderline cardiomegaly and asymmetric elevation of the right hemidiaphragm, unchanged. Electronically Signed   By: Valetta Mole M.D.   On: 03/19/2021 18:09   ECHOCARDIOGRAM COMPLETE  Result Date: 03/20/2021    ECHOCARDIOGRAM REPORT   Patient Name:   KAYLIAH TINDOL Date of Exam: 03/20/2021 Medical Rec #:  [id number]    Height:       61.0 in Accession #:    [id number]   Weight:       185.3 lb  Date of Birth:  1965-08-29   BSA:          1.828 m Patient Age:    74 years     BP:           131/81 mmHg Patient Gender: F            HR:           69 bpm. Exam Location:  Inpatient Procedure: 2D Echo Indications:     NSTEMI  History:         Patient has prior history of Echocardiogram examinations, most  recent 07/30/2020. Cardiomyopathy, CAD; Risk                  Factors:Hypertension, Dyslipidemia and Diabetes.  Sonographer:     [place] Referring Phys:  [id number] Abigail Butts Diagnosing Phys: Carlyle Dolly MD IMPRESSIONS  1. Left ventricular ejection fraction, by estimation, is 40 to 45%. The left ventricle has mildly decreased function. The left ventricle demonstrates global hypokinesis. Left ventricular diastolic parameters are consistent with Grade I diastolic dysfunction (impaired relaxation). Elevated left atrial pressure.  2. Right ventricular systolic function is normal. The right ventricular size is normal. Tricuspid regurgitation signal is inadequate for assessing PA pressure.  3. The mitral valve is abnormal. Mild mitral valve regurgitation. No evidence of mitral stenosis.  4. The aortic valve is tricuspid. There is moderate calcification of the aortic valve. There is moderate thickening of the aortic valve. Aortic valve regurgitation is not visualized. No aortic stenosis is present. FINDINGS  Left Ventricle: Left ventricular ejection fraction, by estimation, is 40 to 45%. The left ventricle has mildly decreased function. The left ventricle demonstrates global hypokinesis. The left ventricular internal cavity size was normal in size. There is  no left ventricular hypertrophy. Left ventricular diastolic parameters are consistent with Grade I diastolic dysfunction (impaired relaxation). Elevated left atrial pressure. Right Ventricle: The right ventricular size is normal. Right vetricular wall thickness was not well visualized. Right ventricular systolic function is  normal. Tricuspid regurgitation signal is inadequate for assessing PA pressure. Left Atrium: Left atrial size was normal in size. Right Atrium: Right atrial size was normal in size. Pericardium: There is no evidence of pericardial effusion. Mitral Valve: The mitral valve is abnormal. There is mild thickening of the mitral valve leaflet(s). There is mild calcification of the mitral valve leaflet(s). Mild mitral annular calcification. Mild mitral valve regurgitation. No evidence of mitral valve stenosis. Tricuspid Valve: The tricuspid valve is not well visualized. Tricuspid valve regurgitation is not demonstrated. No evidence of tricuspid stenosis. Aortic Valve: The aortic valve is tricuspid. There is moderate calcification of the aortic valve. There is moderate thickening of the aortic valve. There is moderate aortic valve annular calcification. Aortic valve regurgitation is not visualized. No aortic stenosis is present. Aortic valve mean gradient measures 5.2 mmHg. Aortic valve peak gradient measures 9.7 mmHg. Aortic valve area, by VTI measures 1.53 cm. Pulmonic Valve: The pulmonic valve was not well visualized. Pulmonic valve regurgitation is not visualized. No evidence of pulmonic stenosis. Aorta: The aortic root is normal in size and structure. IAS/Shunts: No atrial level shunt detected by color flow Doppler.  LEFT VENTRICLE PLAX 2D LVIDd:         5.10 cm      Diastology LVIDs:         4.30 cm      LV e' medial:    4.46 cm/s LV PW:         1.00 cm      LV E/e' medial:  20.0 LV IVS:        1.10 cm      LV e' lateral:   5.33 cm/s LVOT diam:     1.90 cm      LV E/e' lateral: 16.8 LV SV:         48 LV SV Index:   26 LVOT Area:     2.84 cm  LV Volumes (MOD) LV vol d, MOD A4C: 123.0 ml LV vol s, MOD A4C: 78.6 ml LV SV MOD A4C:  123.0 ml RIGHT VENTRICLE            IVC RV S prime:     9.14 cm/s  IVC diam: 1.50 cm TAPSE (M-mode): 2.2 cm LEFT ATRIUM             Index LA diam:        3.80 cm 2.08 cm/m LA Vol (A2C):    56.9 ml 31.12 ml/m LA Vol (A4C):   42.2 ml 23.08 ml/m LA Biplane Vol: 50.2 ml 27.45 ml/m  AORTIC VALVE AV Area (Vmax):    1.66 cm AV Area (Vmean):   1.46 cm AV Area (VTI):     1.53 cm AV Vmax:           155.37 cm/s AV Vmean:          109.183 cm/s AV VTI:            0.315 m AV Peak Grad:      9.7 mmHg AV Mean Grad:      5.2 mmHg LVOT Vmax:         90.70 cm/s LVOT Vmean:        56.200 cm/s LVOT VTI:          0.170 m LVOT/AV VTI ratio: 0.54  AORTA Ao Root diam: 2.90 cm Ao Asc diam:  3.60 cm MITRAL VALVE MV Area (PHT): 3.72 cm     SHUNTS MV Decel Time: 204 msec     Systemic VTI:  0.17 m MR Peak grad: 91.8 mmHg     Systemic Diam: 1.90 cm MR Mean grad: 58.0 mmHg MR Vmax:      479.00 cm/s MR Vmean:     365.0 cm/s MV E velocity: 89.30 cm/s MV A velocity: 112.00 cm/s MV E/A ratio:  0.80 Carlyle Dolly MD Electronically signed by Carlyle Dolly MD Signature Date/Time: 03/20/2021/1:54:00 PM    Final (Updated)     Microbiology: Results for orders placed or performed during the hospital encounter of 03/19/21  Resp Panel by RT-PCR (Flu A&B, Covid) Nasopharyngeal Swab     Status: None   Collection Time: 03/19/21  9:10 PM   Specimen: Nasopharyngeal Swab; Nasopharyngeal(NP) swabs in vial transport medium  Result Value Ref Range Status   SARS Coronavirus 2 by RT PCR NEGATIVE NEGATIVE Final    Comment: (NOTE) SARS-CoV-2 target nucleic acids are NOT DETECTED.  The SARS-CoV-2 RNA is generally detectable in upper respiratory specimens during the acute phase of infection. The lowest concentration of SARS-CoV-2 viral copies this assay can detect is 138 copies/mL. A negative result does not preclude SARS-Cov-2 infection and should not be used as the sole basis for treatment or other patient management decisions. A negative result may occur with  improper specimen collection/handling, submission of specimen other than nasopharyngeal swab, presence of viral mutation(s) within the areas targeted by this assay, and  inadequate number of viral copies(<138 copies/mL). A negative result must be combined with clinical observations, patient history, and epidemiological information. The expected result is Negative.  Fact Sheet for Patients:  EntrepreneurPulse.com.au  Fact Sheet for Healthcare Providers:  IncredibleEmployment.be  This test is no t yet approved or cleared by the [place] FDA and  has been authorized for detection and/or diagnosis of SARS-CoV-2 by FDA under an Emergency Use Authorization (EUA). This EUA will remain  in effect (meaning this test can be used) for the duration of the COVID-19 declaration under Section 564(b)(1) of the Act, 21 U.S.C.section 360bbb-3(b)(1), unless the authorization is terminated  or  revoked sooner.       Influenza A by PCR NEGATIVE NEGATIVE Final   Influenza B by PCR NEGATIVE NEGATIVE Final    Comment: (NOTE) The Xpert Xpress SARS-CoV-2/FLU/RSV plus assay is intended as an aid in the diagnosis of influenza from Nasopharyngeal swab specimens and should not be used as a sole basis for treatment. Nasal washings and aspirates are unacceptable for Xpert Xpress SARS-CoV-2/FLU/RSV testing.  Fact Sheet for Patients: EntrepreneurPulse.com.au  Fact Sheet for Healthcare Providers: IncredibleEmployment.be  This test is not yet approved or cleared by the [place] FDA and has been authorized for detection and/or diagnosis of SARS-CoV-2 by FDA under an Emergency Use Authorization (EUA). This EUA will remain in effect (meaning this test can be used) for the duration of the COVID-19 declaration under Section 564(b)(1) of the Act, 21 U.S.C. section 360bbb-3(b)(1), unless the authorization is terminated or revoked.  Performed at [facility], [address] [place], [place] [address]     Labs: CBC: Recent Labs  Lab 03/19/21 1728 03/21/21 6045 03/22/21 0332 03/23/21 0317  03/24/21 0328  WBC 8.2 8.7 8.3 9.5 9.5  NEUTROABS 5.2  --   --   --   --   HGB 11.2* 9.8* 8.6* 9.2* 8.8*  HCT 35.2* 31.0* 27.9* 30.2* 28.0*  MCV 102.0* 101.0* 102.2* 101.3* 101.4*  PLT 280 260 226 258 409   Basic Metabolic Panel: Recent Labs  Lab 03/20/21 0255 03/21/21 0611 03/22/21 0332 03/23/21 0317 03/24/21 0328  NA 134* 138 138 137 138  K 4.0 3.9 4.0 4.4 3.8  CL 107 111 112* 109 111  CO2 18* 17* 18* 20* 18*  GLUCOSE 175* 82 86 122* 107*  BUN 37* 33* 33* 38* 38*  CREATININE 3.25* 3.41* 3.36* 3.72* 3.81*  CALCIUM 8.8* 8.4* 8.5* 8.9 8.6*  MG  --  2.0 2.0 2.0 2.0   Liver Function Tests: Recent Labs  Lab 03/19/21 1728  AST 8*  ALT 10  ALKPHOS 81  BILITOT <0.1*  PROT 7.0  ALBUMIN 3.5   CBG: Recent Labs  Lab 03/23/21 1302 03/23/21 1555 03/23/21 1744 03/23/21 2253 03/24/21 0742  GLUCAP 122* 149* 126* 101* 113*    Discharge time spent: greater than 30 minutes.  Signed: Norma Ignasiak J [place] DO [facility] 03/24/2021

## 2021-03-24 NOTE — Progress Notes (Signed)
Progress Note  Patient Name: Monica Carlson Date of Encounter: 03/24/2021  [facility] Cardiologist: None   Subjective   Denies any chest pain, SOB, dizziness, palpitations. Has not walked in the halls.   Inpatient Medications    Scheduled Meds:  amLODipine  5 mg Oral Daily   aspirin EC  81 mg Oral Daily   atorvastatin  80 mg Oral QHS   carvedilol  6.25 mg Oral BID WC   feeding supplement  237 mL Oral BID BM   gabapentin  200 mg Oral BID   insulin aspart  0-15 Units Subcutaneous TID WC   insulin aspart  0-5 Units Subcutaneous QHS   insulin detemir  20 Units Subcutaneous QHS   levothyroxine  125 mcg Oral QAC breakfast   pantoprazole  40 mg Oral Daily   sodium bicarbonate  650 mg Oral BID   sodium chloride flush  3 mL Intravenous Q12H   sodium chloride flush  3 mL Intravenous Q12H   Continuous Infusions:  sodium chloride     heparin 1,350 Units/hr (03/24/21 0556)   PRN Meds: sodium chloride, acetaminophen, ondansetron (ZOFRAN) IV, sodium chloride flush, traZODone   Vital Signs    Vitals:   03/23/21 1310 03/23/21 1748 03/24/21 0550 03/24/21 0700  BP: 105/73 133/77 111/64 117/62  Pulse:  81  72  Resp: 14     Temp:   97.8 F (36.6 C)   TempSrc:   Axillary   SpO2: 99%  93%   Weight:   87.5 kg   Height:        Intake/Output Summary (Last 24 hours) at 03/24/2021 0833 Last data filed at 03/24/2021 0500 Gross per 24 hour  Intake 150.59 ml  Output --  Net 150.59 ml   Last 3 Weights 03/24/2021 03/23/2021 03/22/2021  Weight (lbs) 192 lb 14.4 oz 198 lb 6.6 oz 190 lb 0.6 oz  Weight (kg) 87.5 kg 90 kg 86.2 kg      Telemetry    Sinus rhythm - Personally Reviewed  ECG    No new tracings - Personally Reviewed  Physical Exam   GEN: No acute distress.   Neck: No JVD Cardiac: RRR, no murmurs, rubs, or gallops.  Vascular: Radial pulses 2+ bilaterally. Right radial cath site stable  Respiratory: Clear to auscultation bilaterally. GI: Soft, nontender, non-distended   MS: Trace edema, no deformities  Neuro:  Nonfocal  Psych: Normal affect   Labs    High Sensitivity Troponin:   Recent Labs  Lab 03/19/21 1728 03/19/21 1846 03/19/21 2249 03/20/21 0446 03/21/21 1012  TROPONINIHS 165* 316* 1,410* 2,132* 885*     Chemistry Recent Labs  Lab 03/19/21 1728 03/20/21 0255 03/22/21 0332 03/23/21 0317 03/24/21 0328  NA 132*   < > 138 137 138  K 4.6   < > 4.0 4.4 3.8  CL 101   < > 112* 109 111  CO2 22   < > 18* 20* 18*  GLUCOSE 424*   < > 86 122* 107*  BUN 41*   < > 33* 38* 38*  CREATININE 3.35*   < > 3.36* 3.72* 3.81*  CALCIUM 9.0   < > 8.5* 8.9 8.6*  MG  --    < > 2.0 2.0 2.0  PROT 7.0  --   --   --   --   ALBUMIN 3.5  --   --   --   --   AST 8*  --   --   --   --  ALT 10  --   --   --   --   ALKPHOS 81  --   --   --   --   BILITOT <0.1*  --   --   --   --   GFRNONAA 15*   < > 15* 14* 13*  ANIONGAP 9   < > 8 8 9    < > = values in this interval not displayed.    Lipids  Recent Labs  Lab 03/20/21 0255  CHOL 240*  TRIG 248*  HDL 36*  LDLCALC 154*  CHOLHDL 6.7    Hematology Recent Labs  Lab 03/22/21 0332 03/23/21 0317 03/24/21 0328  WBC 8.3 9.5 9.5  RBC 2.73* 2.98* 2.76*  HGB 8.6* 9.2* 8.8*  HCT 27.9* 30.2* 28.0*  MCV 102.2* 101.3* 101.4*  MCH 31.5 30.9 31.9  MCHC 30.8 30.5 31.4  RDW 14.1 14.1 14.2  PLT 226 258 213   Thyroid No results for input(s): TSH, FREET4 in the last 168 hours.  BNPNo results for input(s): BNP, PROBNP in the last 168 hours.  DDimer No results for input(s): DDIMER in the last 168 hours.   Radiology    CT chest w/o contrast  Result Date: 03/24/2021 CLINICAL DATA:  Respiratory illness, nondiagnostic x-ray. History of recent COVID pneumonitis. EXAM: CT CHEST WITHOUT CONTRAST TECHNIQUE: Multidetector CT imaging of the chest was performed following the standard protocol without IV contrast. RADIATION DOSE REDUCTION: This exam was performed according to the departmental dose-optimization program which  includes automated exposure control, adjustment of the mA and/or kV according to patient size and/or use of iterative reconstruction technique. COMPARISON:  03/19/2021. FINDINGS: Cardiovascular: The heart is enlarged and there is no pericardial effusion. Multi-vessel coronary artery calcifications are noted. There is atherosclerotic calcification of the aorta without evidence of aneurysm. The pulmonary trunk is normal in caliber. Mediastinum/Nodes: Prominent lymph node is present in the right paratracheal space measuring 1.1 cm in short axis diameter. Evaluation of the hila is limited due to lack of IV contrast. No axillary lymphadenopathy. The thyroid gland, trachea, and esophagus are within normal limits. Lungs/Pleura: There is elevation of the right diaphragm with atelectasis at the lung bases bilaterally. Mild hazy ground-glass opacities are noted in the lungs. No effusion or pneumothorax Upper Abdomen: The gallbladder is surgically absent. There is a wedge shaped hypodense region in the posterior aspect of the spleen. Musculoskeletal: Degenerative changes are present in the thoracic spine. No acute osseous abnormality. IMPRESSION: 1. Mild hazy ground-glass opacities in the lungs, possible edema or residual pneumonitis. 2. Elevation of the right diaphragm with atelectasis at the lung bases bilaterally. 3. Cardiomegaly with multi-vessel coronary artery calcifications. 4. Wedge-shaped hypodense region in the posterior aspect of the spleen, possible splenic infarct of indeterminate age. Comparison with older imaging studies or CT abdomen may be beneficial for further evaluation. 5. Aortic atherosclerosis. Electronically Signed   By: Brett Fairy M.D.   On: 03/24/2021 02:16   CARDIAC CATHETERIZATION  Result Date: 03/23/2021   Mid RCA lesion is 100% stenosed.  Left to right collaterals.   2nd Mrg lesion is 90% stenosed.   Ost LAD to Mid LAD lesion is 75% stenosed.   1st Diag lesion is 75% stenosed.   LV end  diastolic pressure is mildly elevated.   There is no aortic valve stenosis. Severe, multivessel disease.  Diffuse disease throughout the left system.  Occluded mid RCA stent.  Large right PDA and distal RCA system which fills by collaterals from  the left.  Restart heparin in 8 hours.  Discussed with Dr. Martinique.  Will obtain CVTS consult.  Contrast minimize due to renal dysfunction. Message left for daughter, Karn Pickler.    Cardiac Studies   Echo: 03/20/21    IMPRESSIONS     1. Left ventricular ejection fraction, by estimation, is 40 to 45%. The  left ventricle has mildly decreased function. The left ventricle  demonstrates global hypokinesis. Left ventricular diastolic parameters are  consistent with Grade I diastolic  dysfunction (impaired relaxation). Elevated left atrial pressure.   2. Right ventricular systolic function is normal. The right ventricular  size is normal. Tricuspid regurgitation signal is inadequate for assessing  PA pressure.   3. The mitral valve is abnormal. Mild mitral valve regurgitation. No  evidence of mitral stenosis.   4. The aortic valve is tricuspid. There is moderate calcification of the  aortic valve. There is moderate thickening of the aortic valve. Aortic  valve regurgitation is not visualized. No aortic stenosis is present.   FINDINGS   Left Ventricle: Left ventricular ejection fraction, by estimation, is 40  to 45%. The left ventricle has mildly decreased function. The left  ventricle demonstrates global hypokinesis. The left ventricular internal  cavity size was normal in size. There is   no left ventricular hypertrophy. Left ventricular diastolic parameters  are consistent with Grade I diastolic dysfunction (impaired relaxation).  Elevated left atrial pressure.   Right Ventricle: The right ventricular size is normal. Right vetricular  wall thickness was not well visualized. Right ventricular systolic  function is normal. Tricuspid regurgitation signal  is inadequate for  assessing PA pressure.   Left Atrium: Left atrial size was normal in size.   Right Atrium: Right atrial size was normal in size.   Pericardium: There is no evidence of pericardial effusion.   Mitral Valve: The mitral valve is abnormal. There is mild thickening of  the mitral valve leaflet(s). There is mild calcification of the mitral  valve leaflet(s). Mild mitral annular calcification. Mild mitral valve  regurgitation. No evidence of mitral  valve stenosis.   Tricuspid Valve: The tricuspid valve is not well visualized. Tricuspid  valve regurgitation is not demonstrated. No evidence of tricuspid  stenosis.   Aortic Valve: The aortic valve is tricuspid. There is moderate  calcification of the aortic valve. There is moderate thickening of the  aortic valve. There is moderate aortic valve annular calcification. Aortic  valve regurgitation is not visualized. No  aortic stenosis is present. Aortic valve mean gradient measures 5.2 mmHg.  Aortic valve peak gradient measures 9.7 mmHg. Aortic valve area, by VTI  measures 1.53 cm.   Pulmonic Valve: The pulmonic valve was not well visualized. Pulmonic valve  regurgitation is not visualized. No evidence of pulmonic stenosis.   Aorta: The aortic root is normal in size and structure.   IAS/Shunts: No atrial level shunt detected by color flow Doppler.     Cardiac cath: 03/23/2021 Conclusion       Mid RCA lesion is 100% stenosed.  Left to right collaterals.   2nd Mrg lesion is 90% stenosed.   Ost LAD to Mid LAD lesion is 75% stenosed.   1st Diag lesion is 75% stenosed.   LV end diastolic pressure is mildly elevated.   There is no aortic valve stenosis.   Severe, multivessel disease.  Diffuse disease throughout the left system.  Occluded mid RCA stent.  Large right PDA and distal RCA system which fills by collaterals from the left.  Restart heparin in 8 hours.  Discussed with Dr. Martinique.  Will obtain CVTS consult.   Contrast minimize due to renal dysfunction. Diagnostic Dominance: Right Intervention  Patient Profile     56 y.o. female with a PMH of CAD s/p BMS to RCA in 2004, ischemic cardiomyopathy with recovery of EF, chronic combined CHF, HTN, HLD, DM type II, CVA's, CKD stage IV who is being seen 03/20/2021 for the evaluation of NSTEMI at the request of Dr. Matilde Sprang.  Assessment & Plan    Inferior STEMI/CAD hx of BMS to RCA '04 - hsTn 2132 - EKG on admission showed inferior ST elevation, but patient was pain free at that time  - Serial EKG showed continues ST changes in lead III, aVF  - Cath on 1/31 showed RCA occluded with collaterals, severe disease in LCA system. Patient was not a candidate for PCI  - Obtained CTS consult for possible CABG. Per note, patient was found to be a poor candidate for mulitvessel CABS due to her renal disease, deconditioning, poor targets for grafting, recent COVID. Recommended medical treatment for now, possibly can be reevaluated outpatient once given time to recover from COVID pneumontitis  - Continue ASA, novasc 10 mg daily, coreg 6.25mg  BID, atorvastatin 80mg  daily  - Restarted heparin infusion yesterday after cath. As patient is not going to undergo CABG or PCI at this time, and has already received 48 hours of heparin, will transition heparin infusion to DVT prophylactic dosing   Chronic combined HF: Echo this admission showed EF 40-45%, global hypokinesis, G1DD.  - Patient is euvolemic on exam  - Continue BB, unable to add ARB/ACE/spiro with renal disease   HTN  - Stable on current regiment   HLD  - Lipid panel on admission showed LDL 154, HDL 36, Triglycerides 248  - Continue high intensity statin  - Referral to lipid clinic   DM  - On SSI, managed per primary   Hx of CVA  - On ASA/Plavix PTA  - Has been off plavix since admission   CKD IV  - prior baseline 2.1-2.3, now around 3.2-3.3 - Hydrated for cath, only 30cc contrast used during cath  -  Continue to avoid nephrotoxic medications   Anemia  - Hemoglobin 8.8 this AM (down from 9.2) - Managed per primary     For questions or updates, please contact [facility] Please consult [email or web address] for contact info under        Signed, Margie Billet, PA-C  03/24/2021, 8:33 AM

## 2021-03-24 NOTE — Care Management Important Message (Signed)
Important Message  Patient Details  Name: Monica Carlson MRN: [id number] Date of Birth: 02/29/1965   Medicare Important Message Given:  Yes     Shelda Altes 03/24/2021, 1:17 PM

## 2021-03-24 NOTE — Progress Notes (Signed)
ANTICOAGULATION CONSULT NOTE - Follow Up Consult  Pharmacy Consult for IV heparin Indication: chest pain/ACS  Allergies  Allergen Reactions   Cedar     Swelling    Other     Teriyaki- swelling    Patient Measurements: Height: 5\' 1"  (154.9 cm) Weight: 90 kg (198 lb 6.6 oz) IBW/kg (Calculated) : 47.8 Heparin Dosing Weight: 67kg  Vital Signs: BP: 133/77 (01/31 1748) Pulse Rate: 81 (01/31 1748)  Labs: Recent Labs    03/21/21 0611 03/21/21 1012 03/21/21 1329 03/22/21 0332 03/23/21 0317 03/24/21 0328  HGB 9.8*  --   --  8.6* 9.2* 8.8*  HCT 31.0*  --   --  27.9* 30.2* 28.0*  PLT 260  --   --  226 258 213  HEPARINUNFRC 0.32  --    < > 0.44 0.27* 0.12*  CREATININE 3.41*  --   --  3.36* 3.72*  --   TROPONINIHS  --  885*  --   --   --   --    < > = values in this interval not displayed.     Estimated Creatinine Clearance: 17.2 mL/min (A) (by C-G formula based on SCr of 3.72 mg/dL (H)).  Assessment: 78 YOF presenting with chest pain. Pt started on IV heparin. No AC PTA.  Pt s/p LHC with plans for CVTS consult. Heparin to resume 8h after sheath pull (~0800).  2/1 AM update:  Heparin level low Not CABG candidate at this time  Goal of Therapy:  Heparin level 0.3-0.7 units/ml Monitor platelets by anticoagulation protocol: Yes   Plan:  Inc heparin to 1350 units/hr 1300 heparin level  Narda Bonds, PharmD, BCPS Clinical Pharmacist Phone: [phone]

## 2021-03-24 NOTE — Progress Notes (Addendum)
CARDIAC REHAB PHASE I   PRE:  Rate/Rhythm: 73 SR    BP: sitting 86/53    SaO2: 92 RA  MODE:  Ambulation: 180 ft   POST:  Rate/Rhythm: 87 SR    BP: sitting 97/62     SaO2: 93 RA  Pt ambulated with RW and contact guard. Denied CP or SOB. Tolerated well. Urgently to BR after walk, able to clean herself. SaO2 during walking 88 RA at lowest, up with deeper breaths. Gave IS to practice at home. 750 ml currently.  Discussed with pt MI, restrictions, NTG, diet, exercise and CRPII. Pt receptive. She would love to do CRPII at [facility]. This would be appropriate if medical tx of CAD. Will refer. [place], ACSM 03/24/2021 12:53 PM

## 2021-04-14 ENCOUNTER — Ambulatory Visit: Payer: Medicare Other | Admitting: Nurse Practitioner

## 2021-04-14 NOTE — Patient Instructions (Incomplete)

## 2021-04-15 ENCOUNTER — Other Ambulatory Visit: Payer: Self-pay

## 2021-04-15 ENCOUNTER — Telehealth (INDEPENDENT_AMBULATORY_CARE_PROVIDER_SITE_OTHER): Payer: Medicare Other | Admitting: Pharmacist

## 2021-04-15 DIAGNOSIS — E7800 Pure hypercholesterolemia, unspecified: Secondary | ICD-10-CM

## 2021-04-15 MED ORDER — ICOSAPENT ETHYL 1 G PO CAPS: 2.0000 g | ORAL_CAPSULE | Freq: Two times a day (BID) | ORAL | 11 refills | Status: AC

## 2021-04-15 MED ORDER — EZETIMIBE 10 MG PO TABS: 10.0000 mg | ORAL_TABLET | Freq: Every day | ORAL | 11 refills | Status: AC

## 2021-04-15 NOTE — Progress Notes (Signed)
Patient ID: Monica Carlson                 DOB: Jun 18, 1965                    MRN: [id number]     HPI: Monica Carlson is a 56 y.o. female patient of Dr Radford Pax referred to lipid clinic by inpatient PA after recent hospitalization. PMH is significant for CAD s/p BMS to RCA in 2004, nuclear stress test 03/2017 with large defect present in the apical anterior and apex consistent with prior MI, CHF with improvement in LVEF from 20% to 40-45% on 05/2017 echo, DM, HTN, CKD, and 4 strokes. She was admitted 03/19/21 with NSTEMI. [place] 03/23/21 showed occluded RCA with collaterals, severe disease in the LCA and not a candidate for PCI or CABG with recommendation for medical treatment.  Visit today conducted via telephone. Not sure which medication she's taking. Asked her to check pill bottles to confirm she has rx for atorvastatin. Knows she's taking carvedilol because she found that bottle. States she has a lot of medications bubble packed. Atorvastatin is not on that list. After a few minutes, she found a bottle of atorvastatin but states she hasn't started taking it yet. Some confusion over having meds in bubble packing as well as separate pill bottles. Plans to go to the pharmacy to have them add all meds to pill packaging to help with compliance. She will start her atorvastatin. Before her most recent hospitalization, was previously prescribed rosuvastatin for her cholesterol. Doesn't know if she was taking it. Would anticipate not based on her LDL at time of admission. TG have been over 1,000 in the past.  Current Medications: atorvastatin 80mg  daily - not taking Risk Factors: premature and progressive ASCVD, DM, multiple strokes, CHF, HTN, CKD LDL goal: 55mg /dL  Diet: fish, chicken, avoids red meat  Exercise: minimal, may start walking  Family History: Mother with migraines, father with heart disease, DM, and HTN, maternal grandfather with migraines and MI, paternal grandmother with lung cancer.  Social  History: Denies tobacco, alcohol, and drug use.  Labs: 03/20/21: TC 240, TG 248, HDL 36, LDL 154 (rosuvastatin 20mg  daily)  Past Medical History:  Diagnosis Date   Cervical cancer ([place])    cryotherapy and colposcopy/[place]; 1991   CHF (congestive heart failure) ([place]) 07-2014   Diabetes mellitus without complication ([place]) 5027   High cholesterol    Hypertension    MI (myocardial infarction) ([place])    2004   Stroke [facility]) 09/2014, 2013   Vaginal Pap smear, abnormal     Current Outpatient Medications on File Prior to Visit  Medication Sig Dispense Refill   acetaminophen (TYLENOL) 500 MG tablet Take 1,000-1,500 mg by mouth every 6 (six) hours as needed for mild pain.     albuterol (VENTOLIN HFA) 108 (90 Base) MCG/ACT inhaler Inhale 2 puffs into the lungs every 4 (four) hours as needed for shortness of breath.     amLODipine (NORVASC) 5 MG tablet Take 5 mg by mouth daily.     aspirin EC 81 MG EC tablet Take 1 tablet (81 mg total) by mouth daily. Swallow whole. 30 tablet 2   atorvastatin (LIPITOR) 80 MG tablet Take 1 tablet (80 mg total) by mouth at bedtime. 30 tablet 2   Blood Glucose Monitoring Suppl (ACCU-CHEK GUIDE ME) w/Device KIT 1 Piece by Does not apply route as directed. 1 kit 0   carvedilol (COREG) 6.25 MG tablet Take  6.25 mg by mouth 2 (two) times daily.     Cholecalciferol (VITAMIN D) 50 MCG (2000 UT) CAPS Take 2,000 Units by mouth daily.     clopidogrel (PLAVIX) 75 MG tablet TAKE ONE TABLET BY MOUTH EVERY DAY WITH BREAKFAST (Patient taking differently: Take 75 mg by mouth daily.) 90 tablet 1   diclofenac Sodium (VOLTAREN) 1 % GEL Apply 2 g topically 4 (four) times daily as needed (pain/swelling). 2 g 0   furosemide (LASIX) 20 MG tablet Take 20 mg by mouth 2 (two) times daily.     gabapentin (NEURONTIN) 300 MG capsule Take 300 mg by mouth 2 (two) times daily.     glipiZIDE (GLUCOTROL) 10 MG tablet TAKE 1 TABLET BY MOUTH TWICE DAILY BEFORE MEALS 180 tablet 0   glucose  blood (ACCU-CHEK GUIDE) test strip Use as instructed 4 x daily. e11.65 150 each 2   glucose blood test strip 1 each by Other route 2 (two) times daily. Use as instructed bid. E11.65 200 each 2   JANUVIA 50 MG tablet TAKE 1 TABLET BY MOUTH EVERY DAY (Patient taking differently: Take 50 mg by mouth daily.) 90 tablet 0   Lancets Thin MISC 1 each by Does not apply route 4 (four) times daily. E11.65 Pt has Prodigy meter 150 each 2   levothyroxine (SYNTHROID) 125 MCG tablet TAKE 1 TABLET BY MOUTH DAILY BEFORE BREAKFAST (Patient taking differently: Take 125 mcg by mouth daily before breakfast.) 90 tablet 0   meclizine (ANTIVERT) 25 MG tablet Take 1 tablet (25 mg total) by mouth 3 (three) times daily as needed for dizziness. 30 tablet 0   nitroGLYCERIN (NITROSTAT) 0.4 MG SL tablet Place 1 tablet (0.4 mg total) under the tongue every 5 (five) minutes as needed for chest pain. 30 tablet 2   potassium chloride SA (KLOR-CON M) 20 MEQ tablet Take 20 mEq by mouth 2 (two) times daily.     sodium bicarbonate 650 MG tablet Take 1 tablet (650 mg total) by mouth 2 (two) times daily. 60 tablet 2   traZODone (DESYREL) 50 MG tablet Take 50 mg by mouth at bedtime as needed.     vitamin C (ASCORBIC ACID) 500 MG tablet Take 500 mg by mouth daily.     Vitamin D, Ergocalciferol, (DRISDOL) 50000 units CAPS capsule TAKE ONE CAPSULE BY MOUTH ONCE WEEKLY ON MONDAY  0   Zinc 50 MG TABS Take 50 mg by mouth daily.     Current Facility-Administered Medications on File Prior to Visit  Medication Dose Route Frequency Provider Last Rate Last Admin   glucose blood test strip STRP 1 each  1 each Other BID Dorris Fetch Marella Chimes, MD        Allergies  Allergen Reactions   Cedar     Swelling    Other     Teriyaki- swelling    Assessment/Plan:  1. Hyperlipidemia - LDL 154 last month above goal < 55 given premature and progressive ASCVD, DM, and CKD. Seem to be issues with med compliance. Pt not sure if she was taking rosuvastatin  before her hospitalization. Was changed to atorvastatin $RemoveBeforeD'80mg'tHqTFenmyrGQus$  daily at discharge (preferred statin given her CKD) but she has not started taking it since it was in a separate pill bottle and not her bubble packed meds. She plans to go to the pharmacy to have them put all her meds in pill packaging. Will also add on Vascepa 2g BID and ezetimibe $RemoveBefor'10mg'kvpiyMuUkMIU$  daily. Stressed the importance of med compliance. Fenofibrate  contraindicated given her CKD. May need PCSK9i in the future but this would require in office visit for in depth med training. Establishes with Dr Radford Pax in 3 weeks, will need lipids checked in [place] or [place] in ~1-2 months. I'll call pt around that time frame if they have not been checked by another provider beforehand.  Kingstyn Deruiter E. Anastacia Reinecke, PharmD, BCACP, [place] [address] [place], [place] [address] Phone: [phone]; Fax: [phone] 04/15/2021 9:53 AM

## 2021-04-21 ENCOUNTER — Encounter (HOSPITAL_COMMUNITY): Payer: Self-pay

## 2021-04-21 ENCOUNTER — Inpatient Hospital Stay (HOSPITAL_COMMUNITY)
Admission: EM | Admit: 2021-04-21 | Discharge: 2021-04-23 | DRG: 280 | Disposition: A | Discharge status: Home / Self Care | Payer: Medicare Other | Attending: Internal Medicine | Admitting: Internal Medicine

## 2021-04-21 ENCOUNTER — Other Ambulatory Visit: Payer: Self-pay

## 2021-04-21 ENCOUNTER — Emergency Department (HOSPITAL_COMMUNITY): Payer: Medicare Other

## 2021-04-21 DIAGNOSIS — I214 Non-ST elevation (NSTEMI) myocardial infarction: Principal | ICD-10-CM | POA: Diagnosis present

## 2021-04-21 DIAGNOSIS — E039 Hypothyroidism, unspecified: Secondary | ICD-10-CM | POA: Diagnosis not present

## 2021-04-21 DIAGNOSIS — Z9109 Other allergy status, other than to drugs and biological substances: Secondary | ICD-10-CM

## 2021-04-21 DIAGNOSIS — Z7984 Long term (current) use of oral hypoglycemic drugs: Secondary | ICD-10-CM

## 2021-04-21 DIAGNOSIS — E1165 Type 2 diabetes mellitus with hyperglycemia: Secondary | ICD-10-CM | POA: Diagnosis present

## 2021-04-21 DIAGNOSIS — Z7982 Long term (current) use of aspirin: Secondary | ICD-10-CM

## 2021-04-21 DIAGNOSIS — Z8673 Personal history of transient ischemic attack (TIA), and cerebral infarction without residual deficits: Secondary | ICD-10-CM

## 2021-04-21 DIAGNOSIS — Z91018 Allergy to other foods: Secondary | ICD-10-CM

## 2021-04-21 DIAGNOSIS — R0789 Other chest pain: Secondary | ICD-10-CM | POA: Diagnosis not present

## 2021-04-21 DIAGNOSIS — R079 Chest pain, unspecified: Secondary | ICD-10-CM | POA: Diagnosis not present

## 2021-04-21 DIAGNOSIS — E669 Obesity, unspecified: Secondary | ICD-10-CM | POA: Diagnosis present

## 2021-04-21 DIAGNOSIS — Z9049 Acquired absence of other specified parts of digestive tract: Secondary | ICD-10-CM

## 2021-04-21 DIAGNOSIS — Z8249 Family history of ischemic heart disease and other diseases of the circulatory system: Secondary | ICD-10-CM

## 2021-04-21 DIAGNOSIS — E66812 Obesity, class 2: Secondary | ICD-10-CM | POA: Diagnosis present

## 2021-04-21 DIAGNOSIS — I2510 Atherosclerotic heart disease of native coronary artery without angina pectoris: Secondary | ICD-10-CM | POA: Diagnosis present

## 2021-04-21 DIAGNOSIS — Z79890 Hormone replacement therapy (postmenopausal): Secondary | ICD-10-CM

## 2021-04-21 DIAGNOSIS — Z7902 Long term (current) use of antithrombotics/antiplatelets: Secondary | ICD-10-CM

## 2021-04-21 DIAGNOSIS — Z79899 Other long term (current) drug therapy: Secondary | ICD-10-CM

## 2021-04-21 DIAGNOSIS — I130 Hypertensive heart and chronic kidney disease with heart failure and stage 1 through stage 4 chronic kidney disease, or unspecified chronic kidney disease: Secondary | ICD-10-CM | POA: Diagnosis present

## 2021-04-21 DIAGNOSIS — Z20822 Contact with and (suspected) exposure to covid-19: Secondary | ICD-10-CM | POA: Diagnosis present

## 2021-04-21 DIAGNOSIS — Z8701 Personal history of pneumonia (recurrent): Secondary | ICD-10-CM

## 2021-04-21 DIAGNOSIS — E782 Mixed hyperlipidemia: Secondary | ICD-10-CM | POA: Diagnosis present

## 2021-04-21 DIAGNOSIS — Z833 Family history of diabetes mellitus: Secondary | ICD-10-CM

## 2021-04-21 DIAGNOSIS — I5089 Other heart failure: Secondary | ICD-10-CM

## 2021-04-21 DIAGNOSIS — N184 Chronic kidney disease, stage 4 (severe): Secondary | ICD-10-CM

## 2021-04-21 DIAGNOSIS — D631 Anemia in chronic kidney disease: Secondary | ICD-10-CM | POA: Diagnosis present

## 2021-04-21 DIAGNOSIS — R7989 Other specified abnormal findings of blood chemistry: Secondary | ICD-10-CM | POA: Diagnosis present

## 2021-04-21 DIAGNOSIS — Z6835 Body mass index (BMI) 35.0-35.9, adult: Secondary | ICD-10-CM

## 2021-04-21 DIAGNOSIS — D509 Iron deficiency anemia, unspecified: Secondary | ICD-10-CM | POA: Diagnosis present

## 2021-04-21 DIAGNOSIS — Z8616 Personal history of COVID-19: Secondary | ICD-10-CM

## 2021-04-21 DIAGNOSIS — Z955 Presence of coronary angioplasty implant and graft: Secondary | ICD-10-CM

## 2021-04-21 DIAGNOSIS — Z635 Disruption of family by separation and divorce: Secondary | ICD-10-CM

## 2021-04-21 DIAGNOSIS — I5043 Acute on chronic combined systolic (congestive) and diastolic (congestive) heart failure: Secondary | ICD-10-CM | POA: Diagnosis present

## 2021-04-21 DIAGNOSIS — I493 Ventricular premature depolarization: Secondary | ICD-10-CM | POA: Diagnosis present

## 2021-04-21 DIAGNOSIS — R9431 Abnormal electrocardiogram [ECG] [EKG]: Secondary | ICD-10-CM | POA: Diagnosis present

## 2021-04-21 DIAGNOSIS — I255 Ischemic cardiomyopathy: Secondary | ICD-10-CM | POA: Diagnosis present

## 2021-04-21 DIAGNOSIS — I252 Old myocardial infarction: Secondary | ICD-10-CM

## 2021-04-21 DIAGNOSIS — E1122 Type 2 diabetes mellitus with diabetic chronic kidney disease: Secondary | ICD-10-CM | POA: Diagnosis present

## 2021-04-21 DIAGNOSIS — Z8541 Personal history of malignant neoplasm of cervix uteri: Secondary | ICD-10-CM

## 2021-04-21 LAB — CBC WITH DIFFERENTIAL/PLATELET
Abs Immature Granulocytes: 0.05 10*3/uL (ref 0.00–0.07)
Basophils Absolute: 0 10*3/uL (ref 0.0–0.1)
Basophils Relative: 0 %
Eosinophils Absolute: 0 10*3/uL (ref 0.0–0.5)
Eosinophils Relative: 0 %
HCT: 33.3 % — ABNORMAL LOW (ref 36.0–46.0)
Hemoglobin: 10.4 g/dL — ABNORMAL LOW (ref 12.0–15.0)
Immature Granulocytes: 0 %
Lymphocytes Relative: 11 %
Lymphs Abs: 1.4 10*3/uL (ref 0.7–4.0)
MCH: 32.1 pg (ref 26.0–34.0)
MCHC: 31.2 g/dL (ref 30.0–36.0)
MCV: 102.8 fL — ABNORMAL HIGH (ref 80.0–100.0)
Monocytes Absolute: 0.4 10*3/uL (ref 0.1–1.0)
Monocytes Relative: 3 %
Neutro Abs: 11 10*3/uL — ABNORMAL HIGH (ref 1.7–7.7)
Neutrophils Relative %: 86 %
Platelets: 272 10*3/uL (ref 150–400)
RBC: 3.24 MIL/uL — ABNORMAL LOW (ref 3.87–5.11)
RDW: 14.3 % (ref 11.5–15.5)
WBC: 13 10*3/uL — ABNORMAL HIGH (ref 4.0–10.5)
nRBC: 0 % (ref 0.0–0.2)

## 2021-04-21 LAB — TROPONIN I (HIGH SENSITIVITY)
Troponin I (High Sensitivity): 181 ng/L (ref <18)
Troponin I (High Sensitivity): 845 ng/L (ref <18)

## 2021-04-21 LAB — COMPREHENSIVE METABOLIC PANEL
ALT: 9 U/L (ref 0–44)
AST: 11 U/L — ABNORMAL LOW (ref 15–41)
Albumin: 3.6 g/dL (ref 3.5–5.0)
Alkaline Phosphatase: 78 U/L (ref 38–126)
Anion gap: 10 (ref 5–15)
BUN: 40 mg/dL — ABNORMAL HIGH (ref 6–20)
CO2: 19 mmol/L — ABNORMAL LOW (ref 22–32)
Calcium: 8.8 mg/dL — ABNORMAL LOW (ref 8.9–10.3)
Chloride: 106 mmol/L (ref 98–111)
Creatinine, Ser: 3.33 mg/dL — ABNORMAL HIGH (ref 0.44–1.00)
GFR, Estimated: 16 mL/min — ABNORMAL LOW (ref >60)
Glucose, Bld: 344 mg/dL — ABNORMAL HIGH (ref 70–99)
Potassium: 3.9 mmol/L (ref 3.5–5.1)
Sodium: 135 mmol/L (ref 135–145)
Total Bilirubin: 0.4 mg/dL (ref 0.3–1.2)
Total Protein: 7 g/dL (ref 6.5–8.1)

## 2021-04-21 LAB — HEPARIN LEVEL (UNFRACTIONATED)
Heparin Unfractionated: 0.29 IU/mL — ABNORMAL LOW (ref 0.30–0.70)
Heparin Unfractionated: 0.15 IU/mL — ABNORMAL LOW (ref 0.30–0.70)

## 2021-04-21 LAB — RESP PANEL BY RT-PCR (FLU A&B, COVID) ARPGX2
Influenza A by PCR: NEGATIVE
Influenza B by PCR: NEGATIVE
SARS Coronavirus 2 by RT PCR: NEGATIVE

## 2021-04-21 LAB — BRAIN NATRIURETIC PEPTIDE: B Natriuretic Peptide: 1180 pg/mL — ABNORMAL HIGH (ref 0.0–100.0)

## 2021-04-21 LAB — MAGNESIUM: Magnesium: 2.2 mg/dL (ref 1.7–2.4)

## 2021-04-21 LAB — CBG MONITORING, ED
Glucose-Capillary: 323 mg/dL — ABNORMAL HIGH (ref 70–99)
Glucose-Capillary: 247 mg/dL — ABNORMAL HIGH (ref 70–99)
Glucose-Capillary: 171 mg/dL — ABNORMAL HIGH (ref 70–99)
Glucose-Capillary: 137 mg/dL — ABNORMAL HIGH (ref 70–99)

## 2021-04-21 LAB — LIPASE, BLOOD: Lipase: 54 U/L — ABNORMAL HIGH (ref 11–51)

## 2021-04-21 LAB — PROCALCITONIN: Procalcitonin: <0.1 ng/mL

## 2021-04-21 LAB — POC URINE PREG, ED: Preg Test, Ur: NEGATIVE

## 2021-04-21 LAB — GLUCOSE, CAPILLARY: Glucose-Capillary: 234 mg/dL — ABNORMAL HIGH (ref 70–99)

## 2021-04-21 MED ORDER — TRAZODONE HCL 50 MG PO TABS: 50.0000 mg | ORAL_TABLET | Freq: Every evening | ORAL | Status: DC | PRN

## 2021-04-21 MED ORDER — NITROGLYCERIN 0.4 MG SL SUBL: 0.4000 mg | SUBLINGUAL_TABLET | SUBLINGUAL | Status: DC | PRN

## 2021-04-21 MED ORDER — FUROSEMIDE 10 MG/ML IJ SOLN
40.0000 mg | Freq: Two times a day (BID) | INTRAMUSCULAR | Status: DC
  Administered 2021-04-21 – 2021-04-23 (×4): 40 mg via INTRAVENOUS
  Filled 2021-04-21 (×4): qty 4

## 2021-04-21 MED ORDER — ASPIRIN EC 81 MG PO TBEC
81.0000 mg | DELAYED_RELEASE_TABLET | Freq: Every day | ORAL | Status: DC
  Administered 2021-04-21 – 2021-04-23 (×3): 81 mg via ORAL
  Filled 2021-04-21 (×3): qty 1

## 2021-04-21 MED ORDER — ONDANSETRON HCL 4 MG/2ML IJ SOLN: 4.0000 mg | Freq: Four times a day (QID) | INTRAMUSCULAR | Status: DC | PRN

## 2021-04-21 MED ORDER — ASPIRIN EC 81 MG PO TBEC: 81.0000 mg | DELAYED_RELEASE_TABLET | Freq: Every day | ORAL | Status: DC

## 2021-04-21 MED ORDER — FUROSEMIDE 10 MG/ML IJ SOLN
40.0000 mg | Freq: Once | INTRAMUSCULAR | Status: AC
  Administered 2021-04-21: 40 mg via INTRAVENOUS
  Filled 2021-04-21: qty 4

## 2021-04-21 MED ORDER — GABAPENTIN 300 MG PO CAPS
300.0000 mg | ORAL_CAPSULE | Freq: Two times a day (BID) | ORAL | Status: DC
  Administered 2021-04-21 – 2021-04-23 (×5): 300 mg via ORAL
  Filled 2021-04-21 (×5): qty 1

## 2021-04-21 MED ORDER — ALBUTEROL SULFATE HFA 108 (90 BASE) MCG/ACT IN AERS: 2.0000 | INHALATION_SPRAY | RESPIRATORY_TRACT | Status: DC | PRN

## 2021-04-21 MED ORDER — INSULIN ASPART 100 UNIT/ML IJ SOLN
0.0000 [IU] | INTRAMUSCULAR | Status: DC
  Administered 2021-04-21 (×2): 2 [IU] via SUBCUTANEOUS
  Administered 2021-04-21: 1 [IU] via SUBCUTANEOUS
  Administered 2021-04-22: 4 [IU] via SUBCUTANEOUS
  Administered 2021-04-22: 1 [IU] via SUBCUTANEOUS
  Administered 2021-04-22: 4 [IU] via SUBCUTANEOUS
  Administered 2021-04-22: 1 [IU] via SUBCUTANEOUS
  Filled 2021-04-21 (×3): qty 1

## 2021-04-21 MED ORDER — ASPIRIN 81 MG PO CHEW
324.0000 mg | CHEWABLE_TABLET | Freq: Once | ORAL | Status: DC
  Filled 2021-04-21: qty 4

## 2021-04-21 MED ORDER — CLOPIDOGREL BISULFATE 75 MG PO TABS
75.0000 mg | ORAL_TABLET | Freq: Every day | ORAL | Status: DC
  Administered 2021-04-21 – 2021-04-23 (×3): 75 mg via ORAL
  Filled 2021-04-21 (×3): qty 1

## 2021-04-21 MED ORDER — ISOSORBIDE MONONITRATE ER 30 MG PO TB24
15.0000 mg | ORAL_TABLET | Freq: Every day | ORAL | Status: DC
  Administered 2021-04-21 – 2021-04-23 (×3): 15 mg via ORAL
  Filled 2021-04-21 (×3): qty 1

## 2021-04-21 MED ORDER — HEPARIN (PORCINE) 25000 UT/250ML-% IV SOLN
1400.0000 [IU]/h | INTRAVENOUS | Status: DC
  Administered 2021-04-21: 1200 [IU]/h via INTRAVENOUS
  Administered 2021-04-22: 21:00:00 1250 [IU]/h via INTRAVENOUS
  Administered 2021-04-22: 1450 [IU]/h via INTRAVENOUS
  Filled 2021-04-21 (×3): qty 250

## 2021-04-21 MED ORDER — ATORVASTATIN CALCIUM 40 MG PO TABS
80.0000 mg | ORAL_TABLET | Freq: Every day | ORAL | Status: DC
  Administered 2021-04-21 – 2021-04-22 (×2): 80 mg via ORAL
  Filled 2021-04-21 (×2): qty 2

## 2021-04-21 MED ORDER — ACETAMINOPHEN 325 MG PO TABS
650.0000 mg | ORAL_TABLET | ORAL | Status: DC | PRN
  Administered 2021-04-21: 650 mg via ORAL
  Filled 2021-04-21: qty 2

## 2021-04-21 MED ORDER — AMLODIPINE BESYLATE 5 MG PO TABS
5.0000 mg | ORAL_TABLET | Freq: Every day | ORAL | Status: DC
  Administered 2021-04-21 – 2021-04-23 (×3): 5 mg via ORAL
  Filled 2021-04-21 (×3): qty 1

## 2021-04-21 MED ORDER — CARVEDILOL 3.125 MG PO TABS
6.2500 mg | ORAL_TABLET | Freq: Two times a day (BID) | ORAL | Status: DC
  Administered 2021-04-21 – 2021-04-23 (×5): 6.25 mg via ORAL
  Filled 2021-04-21 (×6): qty 2

## 2021-04-21 MED ORDER — HEPARIN BOLUS VIA INFUSION
4000.0000 [IU] | Freq: Once | INTRAVENOUS | Status: AC
  Administered 2021-04-21: 4000 [IU] via INTRAVENOUS

## 2021-04-21 NOTE — Progress Notes (Signed)
ANTICOAGULATION CONSULT NOTE - Initial Consult ? ?Pharmacy Consult for Heparin  ?Indication: chest pain/ACS ? ?Allergies  ?Allergen Reactions  ? Cedar   ?  Swelling ?  ? Other   ?  Teriyaki- swelling  ? ? ?Patient Measurements: ?Height: 5\' 1"  (154.9 cm) ?Weight: 84.4 kg (186 lb) ?IBW/kg (Calculated) : 47.8 ? ?Vital Signs: ?Temp: 98.4 ?F (36.9 ?C) (03/01 0300) ?BP: 151/93 (03/01 0530) ?Pulse Rate: 70 (03/01 0530) ? ?Labs: ?Recent Labs  ?  04/21/21 ?0410  ?HGB 10.4*  ?HCT 33.3*  ?PLT 272  ?CREATININE 3.33*  ?TROPONINIHS 181*  ? ? ?Estimated Creatinine Clearance: 18.6 mL/min (A) (by C-G formula based on SCr of 3.33 mg/dL (H)). ? ? ?Medical History: ?Past Medical History:  ?Diagnosis Date  ? Cervical cancer ([place])   ? cryotherapy and colposcopy/[place]; 1991  ? CHF (congestive heart failure) ([place]) 07-2014  ? Diabetes mellitus without complication ([place]) 5681  ? High cholesterol   ? Hypertension   ? MI (myocardial infarction) [facility])   ? 2004  ? Stroke [facility]) 09/2014, 2013  ? Vaginal Pap smear, abnormal   ? ?Assessment: ?56 y/o F with known multivessel CAD presents to the ED with chest pain and mildly elevated troponin. Previously not a good CABG candidate due to other co-morbidities. Starting heparin. Hgb 10.4. Noted renal dysfunction.  ? ?Goal of Therapy:  ?Heparin level 0.3-0.7 units/ml ?Monitor platelets by anticoagulation protocol: Yes ?  ?Plan:  ?Heparin 4000 units BOLUS ?Start heparin drip at 1200 units/hr ?1400 Heparin level ?Daily CBC/Heparin level ?Monitor for bleeding ? ?Narda Bonds, PharmD, BCPS ?Clinical Pharmacist ?Phone: [phone] ? ? ? ?

## 2021-04-21 NOTE — Progress Notes (Signed)
Patient seen and examined.  Admitted after midnight by Dr. Myna Hidalgo; secondary to chest pain, nausea/vomiting and elevated troponin.  Patient's heart score of 5; with recent cardiac evaluation demonstrating multiple vessel disease and not a candidate for bypass surgery.  Troponin is higher than a 45 with no significant changes from prior EKG.  Patient qualify for NSTEMI and is receiving management with heparin drip.  After aspirin and nitroglycerin she is pain-free currently.  Will follow further recommendation by cardiology service.  Please refer to H&P written by Dr. Myna Hidalgo on 04/21/2021 for further info/details on admission. ? ?Plan: ?-Continue heparin drip ?-Continue medical management ?-Continue following cardiology service recommendations ?-Follow clinical response. ? ?Barton Dubois MD ?[phone] ? ?

## 2021-04-21 NOTE — ED Provider Notes (Signed)
[facility] EMERGENCY DEPARTMENT Provider Note   CSN: [id number] Arrival date & time: 04/21/21  0242     History  Chief Complaint  Patient presents with   Chest Pain    Monica Carlson is a 56 y.o. female.  56 yo F w/ known CAD pending CTS consult for multivessel disease seen on recent admission and cath. Tonight about 20 minutes after eating pizza she started having the sensation of someone sitting on her chest.  She states when she had her NSTEMI last month it felt like the same feeling but at that time was in her arm and shoulder not really significant chest pain.  This lasted till EMS arrived and gave her nitroglycerin.  Patient thinks it might be indigestion however she is never had indigestion before.  She has some associate shortness of breath with it some nausea.  She is vomited but not at this time.  Right now she is asymptomatic.  No lightheadedness or syncope.  No diaphoresis. No recent fever or cough.    Chest Pain    Mid RCA lesion is 100% stenosed.  Left to right collaterals.   2nd Mrg lesion is 90% stenosed.   Ost LAD to Mid LAD lesion is 75% stenosed.   1st Diag lesion is 75% stenosed.   LV end diastolic pressure is mildly elevated.   There is no aortic valve stenosis.   Home Medications Prior to Admission medications   Medication Sig Start Date End Date Taking? Authorizing Provider  acetaminophen (TYLENOL) 500 MG tablet Take 1,000-1,500 mg by mouth every 6 (six) hours as needed for mild pain.    [provider]  albuterol (VENTOLIN HFA) 108 (90 Base) MCG/ACT inhaler Inhale 2 puffs into the lungs every 4 (four) hours as needed for shortness of breath. 02/27/21   [provider]  amLODipine (NORVASC) 5 MG tablet Take 5 mg by mouth daily. 08/05/20   [provider]  aspirin EC 81 MG EC tablet Take 1 tablet (81 mg total) by mouth daily. Swallow whole. 03/25/21 06/23/21  [place] Donnamarie Poag, DO  atorvastatin (LIPITOR) 80 MG tablet Take 1 tablet (80 mg  total) by mouth at bedtime. 03/24/21 06/22/21  [place] Donnamarie Poag, DO  Blood Glucose Monitoring Suppl (ACCU-CHEK GUIDE ME) w/Device KIT 1 Piece by Does not apply route as directed. 07/09/18   Cassandria Anger, MD  carvedilol (COREG) 6.25 MG tablet Take 6.25 mg by mouth 2 (two) times daily. 02/09/21   [provider]  Cholecalciferol (VITAMIN D) 50 MCG (2000 UT) CAPS Take 2,000 Units by mouth daily.    [provider]  clopidogrel (PLAVIX) 75 MG tablet TAKE ONE TABLET BY MOUTH EVERY DAY WITH BREAKFAST Patient taking differently: Take 75 mg by mouth daily. 07/10/17   Caren Macadam, MD  diclofenac Sodium (VOLTAREN) 1 % GEL Apply 2 g topically 4 (four) times daily as needed (pain/swelling). 06/07/19   Petrucelli, Samantha R, PA-C  ezetimibe (ZETIA) 10 MG tablet Take 1 tablet (10 mg total) by mouth daily. 04/15/21   Sueanne Margarita, MD  furosemide (LASIX) 20 MG tablet Take 20 mg by mouth 2 (two) times daily.    [provider]  gabapentin (NEURONTIN) 300 MG capsule Take 300 mg by mouth 2 (two) times daily. 10/11/17   [provider]  glipiZIDE (GLUCOTROL) 10 MG tablet TAKE 1 TABLET BY MOUTH TWICE DAILY BEFORE MEALS 03/23/21   Brita Romp, NP  glucose blood (ACCU-CHEK GUIDE) test strip Use as  instructed 4 x daily. e11.65 07/10/18   Cassandria Anger, MD  glucose blood test strip 1 each by Other route 2 (two) times daily. Use as instructed bid. E11.65 12/18/18   Cassandria Anger, MD  icosapent Ethyl (VASCEPA) 1 g capsule Take 2 capsules (2 g total) by mouth 2 (two) times daily. With food 04/15/21   Turner, Eber Hong, MD  JANUVIA 50 MG tablet TAKE 1 TABLET BY MOUTH EVERY DAY Patient taking differently: Take 50 mg by mouth daily. 02/23/21   Brita Romp, NP  Lancets Thin MISC 1 each by Does not apply route 4 (four) times daily. E11.65 Pt has Prodigy meter 10/02/18   Nida, Marella Chimes, MD  levothyroxine (SYNTHROID) 125 MCG tablet TAKE 1 TABLET BY MOUTH DAILY  BEFORE BREAKFAST Patient taking differently: Take 125 mcg by mouth daily before breakfast. 02/23/21   Brita Romp, NP  meclizine (ANTIVERT) 25 MG tablet Take 1 tablet (25 mg total) by mouth 3 (three) times daily as needed for dizziness. 11/05/20   Verta Ellen., NP  nitroGLYCERIN (NITROSTAT) 0.4 MG SL tablet Place 1 tablet (0.4 mg total) under the tongue every 5 (five) minutes as needed for chest pain. 08/15/17   Caren Macadam, MD  potassium chloride SA (KLOR-CON M) 20 MEQ tablet Take 20 mEq by mouth 2 (two) times daily. 03/03/21   [provider]  sodium bicarbonate 650 MG tablet Take 1 tablet (650 mg total) by mouth 2 (two) times daily. 03/24/21 06/22/21  [place] Donnamarie Poag, DO  traZODone (DESYREL) 50 MG tablet Take 50 mg by mouth at bedtime as needed. 08/05/20   [provider]  vitamin C (ASCORBIC ACID) 500 MG tablet Take 500 mg by mouth daily. 02/27/21   [provider]  Vitamin D, Ergocalciferol, (DRISDOL) 50000 units CAPS capsule TAKE ONE CAPSULE BY MOUTH ONCE WEEKLY ON MONDAY 11/08/17   [provider]  Zinc 50 MG TABS Take 50 mg by mouth daily. 02/27/21   [provider]      [place] and Other    Review of Systems   Review of Systems  Cardiovascular:  Positive for chest pain.   Physical Exam Updated Vital Signs Ht _0  (1.549 m)    Wt 84.4 kg    BMI 35.14 kg/m  Physical Exam Vitals and nursing note reviewed.  Constitutional:      Appearance: She is well-developed.  HENT:     Head: Normocephalic and atraumatic.  Cardiovascular:     Rate and Rhythm: Normal rate and regular rhythm.  Pulmonary:     Effort: Tachypnea present. No respiratory distress.     Breath sounds: No stridor. Rales present.  Chest:     Chest wall: No mass.  Abdominal:     General: There is no distension.     Palpations: Abdomen is soft.  Musculoskeletal:        General: Normal range of motion.     Cervical back: Normal range of motion.     Right  lower leg: No edema.     Left lower leg: No edema.  Skin:    General: Skin is warm and dry.     Coloration: Skin is not cyanotic or pale.  Neurological:     Mental Status: She is alert.    ED Results / Procedures / Treatments   Labs (all labs ordered are listed, but only abnormal results are displayed) Labs Reviewed  COMPREHENSIVE METABOLIC PANEL  LIPASE, BLOOD  CBC WITH DIFFERENTIAL/PLATELET  BRAIN NATRIURETIC PEPTIDE  CBG MONITORING, ED  POC URINE PREG, ED  TROPONIN I (HIGH SENSITIVITY)    EKG None  Radiology No results found.  Procedures .Critical Care Performed by: Merrily Pew, MD Authorized by: Merrily Pew, MD   Critical care provider statement:    Critical care time (minutes):  30   Critical care was necessary to treat or prevent imminent or life-threatening deterioration of the following conditions:  Cardiac failure   Critical care was time spent personally by me on the following activities:  Development of treatment plan with patient or surrogate, discussions with consultants, evaluation of patient's response to treatment, examination of patient, ordering and review of laboratory studies, ordering and review of radiographic studies, ordering and performing treatments and interventions, pulse oximetry, re-evaluation of patient's condition and review of old charts    Medications Ordered in ED Medications  aspirin chewable tablet 324 mg (has no administration in time range)    ED Course/ Medical Decision Making/ A&P                           Medical Decision Making Amount and/or Complexity of Data Reviewed Labs: ordered. Radiology: ordered.  Risk OTC drugs. Prescription drug management. Decision regarding hospitalization.  ECG not c/w acute ischemia at this time. Will check troponins. Symptom free. Asa already administered. Slightly tachypneic with some rales, will check bnp/cxr as well.  Trop elevated. Pain free. Will start heparin, dose of lasix.  Suspect she can stay here to trend troponins, have cardiology see her and decide on further management if they change drastically as she has been medically managed pending CTS consult until this point.    Final Clinical Impression(s) / ED Diagnoses Final diagnoses:  NSTEMI (non-ST elevated myocardial infarction) [facility])    Rx / [place] Orders ED Discharge Orders     None         Hezekiah Veltre, Corene Cornea, MD 04/21/21 2324

## 2021-04-21 NOTE — Consult Note (Addendum)
Cardiology Consultation:   Patient ID: Monica Carlson MRN: [id number]; DOB: December 04, 1965  Admit date: 04/21/2021 Date of Consult: 04/21/2021  PCP:  Monica Carlson, [facility] Cardiologist:  None       Prior pt of Monica Carlson   Patient Profile:   Monica Carlson is a 56 y.o. female with a hx of CAD prior RCA stent, cardiomyopathy chronic combined systolic and diastolic CHF, CKD-4 HTN, HLD DM CVAs and recent NSTEMI with significant CAD that would require CABG but with TCTS consult not felt to be a candidate due to renal disease, deconditioning, poor targets for grafting, recent COVID plans to treat medically who is being seen 04/21/2021 for the evaluation of chest pain after eating pizza but relieved with 1 sl NTG , + vomiting at the request of Monica Carlson.  History of Present Illness:   Monica Carlson with above hx and most recently cath 03/23/21 with significant CAD, mRCA with 100% stenosis, lt to rt collaterals, 2nd marg 90 % stenosis, ost LAD to mLAD 75% stenosis,  TCTS -Monica Carlson evaluated and in "Summary the patient is a poor candidate for multivessel CABG due to her significant comorbid renal disease, deconditioning, poor targets for grafting, and recent COVID pneumonitis.  If no PCI options I would treat her medically and I could see her back in the office after she has more time to recover from the COVID pneumonitis."  EF 40% -45% G1 DD,  RV normal. Mild MR  Discharged on ASA 81, amlodipine 5 coreg 6.25 BID, plavix 75 lasix 20 BID,  could not be on spiro, ARB, ACE, with CKD4  Pt presented to ER early this AM with chest pain she thought was indigestion, she took tums, pepsi, peppermint and was improved thenbut did take 4 baby ASA with more improvement and NTG with relief.  EKG:  The EKG was personally reviewed and demonstrates:  SR with IVCD T wave inversion inf lat without change from 03/20/21.  Telemetry:  Telemetry was personally reviewed and demonstrates:   SR Na 135, K+ 3.9 glucose 344, Cr 3.33 GFR 16, lipase 54,  BNP 1180 Hs troponin 181, 845 Procalcitonin <0.10 WBC 13 Hgb 10.4 plts 272  Neg preg test Neg covid  PCXR:   IMPRESSION: Interval development of patchy interstitial pulmonary infiltrate, favored to be inflammatory or infectious.  BP 162/92 on arrival now 127/89  P 64  after NTG pain relief  Past Medical History:  Diagnosis Date   Cervical cancer ([place])    cryotherapy and colposcopy/[place], [place]; 1991   CHF (congestive heart failure) ([place]) 07-2014   Diabetes mellitus without complication ([place]) 2595   High cholesterol    Hypertension    MI (myocardial infarction) ([place])    2004   Stroke ([place]) 09/2014, 2013   Vaginal Pap smear, abnormal     Past Surgical History:  Procedure Laterality Date   CHOLECYSTECTOMY  1992   CORONARY STENT PLACEMENT  2004   LEFT HEART CATH AND CORONARY ANGIOGRAPHY N/A 03/23/2021   Procedure: LEFT HEART CATH AND CORONARY ANGIOGRAPHY;  Surgeon: Jettie Booze, MD;  Location: [facility];  Service: Cardiovascular;  Laterality: N/A;   TUBAL LIGATION       Home Medications:  Prior to Admission medications   Medication Sig Start Date End Date Taking? Authorizing Provider  acetaminophen (TYLENOL) 500 MG tablet Take 1,000-1,500 mg by mouth every 6 (six) hours as needed for mild pain.   Yes [provider]  albuterol (VENTOLIN HFA) 108 (90 Base) MCG/ACT inhaler Inhale 2 puffs into the lungs every 4 (four) hours as needed for shortness of breath. 02/27/21  Yes [provider]  amLODipine (NORVASC) 5 MG tablet Take 5 mg by mouth daily. 08/05/20  Yes [provider]  aspirin EC 81 MG EC tablet Take 1 tablet (81 mg total) by mouth daily. Swallow whole. 03/25/21 06/23/21 Yes [place] Monica J, DO  atorvastatin (LIPITOR) 80 MG tablet Take 1 tablet (80 mg total) by mouth at bedtime. 03/24/21 06/22/21 Yes [place] Monica J, DO  carvedilol (COREG) 6.25 MG tablet Take 6.25 mg by mouth  2 (two) times daily. 02/09/21  Yes [provider]  Cholecalciferol (VITAMIN D) 50 MCG (2000 UT) CAPS Take 2,000 Units by mouth daily.   Yes [provider]  clopidogrel (PLAVIX) 75 MG tablet TAKE ONE TABLET BY MOUTH EVERY DAY WITH BREAKFAST Patient taking differently: Take 75 mg by mouth daily. 07/10/17  Yes Carlson, Monica Schneiders, MD  furosemide (LASIX) 20 MG tablet Take 20 mg by mouth 2 (two) times daily.   Yes [provider]  gabapentin (NEURONTIN) 300 MG capsule Take 300 mg by mouth 2 (two) times daily. 10/11/17  Yes [provider]  glipiZIDE (GLUCOTROL) 10 MG tablet TAKE 1 TABLET BY MOUTH TWICE DAILY BEFORE MEALS 03/23/21  Yes Carlson, Monica J, NP  JANUVIA 50 MG tablet TAKE 1 TABLET BY MOUTH EVERY DAY Patient taking differently: Take 50 mg by mouth daily. 02/23/21  Yes Monica Romp, NP  levothyroxine (SYNTHROID) 125 MCG tablet TAKE 1 TABLET BY MOUTH DAILY BEFORE BREAKFAST Patient taking differently: Take 125 mcg by mouth daily before breakfast. 02/23/21  Yes Carlson, Monica Beets, NP  nitroGLYCERIN (NITROSTAT) 0.4 MG SL tablet Place 1 tablet (0.4 mg total) under the tongue every 5 (five) minutes as needed for chest pain. 08/15/17  Yes Carlson, Monica Schneiders, MD  potassium chloride SA (KLOR-CON M) 20 MEQ tablet Take 20 mEq by mouth 2 (two) times daily. 03/03/21  Yes [provider]  sodium bicarbonate 650 MG tablet Take 1 tablet (650 mg total) by mouth 2 (two) times daily. 03/24/21 06/22/21 Yes [place] Monica J, DO  traZODone (DESYREL) 50 MG tablet Take 50 mg by mouth at bedtime as needed. 08/05/20  Yes [provider]  vitamin C (ASCORBIC ACID) 500 MG tablet Take 500 mg by mouth daily. 02/27/21  Yes [provider]  Zinc 50 MG TABS Take 50 mg by mouth daily. 02/27/21  Yes [provider]  Blood Glucose Monitoring Suppl (ACCU-CHEK GUIDE ME) w/Device KIT 1 Piece by Does not apply route as directed. 07/09/18   Monica Anger, MD  chlorthalidone  (HYGROTON) 25 MG tablet Take 25 mg by mouth daily. 04/09/21   [provider]  diclofenac Sodium (VOLTAREN) 1 % GEL Apply 2 g topically 4 (four) times daily as needed (pain/swelling). Patient not taking: Reported on 04/21/2021 06/07/19   Petrucelli, Glynda Jaeger, PA-C  ezetimibe (ZETIA) 10 MG tablet Take 1 tablet (10 mg total) by mouth daily. 04/15/21   Sueanne Margarita, MD  glucose blood (ACCU-CHEK GUIDE) test strip Use as instructed 4 x daily. e11.65 07/10/18   Monica Anger, MD  glucose blood test strip 1 each by Other route 2 (two) times daily. Use as instructed bid. E11.65 12/18/18   Monica Anger, MD  icosapent Ethyl (VASCEPA) 1 g capsule Take 2 capsules (2 g total) by mouth 2 (two) times daily. With food  04/15/21   Sueanne Margarita, MD  Lancets Thin MISC 1 each by Does not apply route 4 (four) times daily. E11.65 Pt has Prodigy meter 10/02/18   Monica Anger, MD  meclizine (ANTIVERT) 25 MG tablet Take 1 tablet (25 mg total) by mouth 3 (three) times daily as needed for dizziness. Patient not taking: Reported on 04/21/2021 11/05/20   Verta Ellen., NP    Inpatient Medications: Scheduled Meds:  amLODipine  5 mg Oral Daily   aspirin EC  81 mg Oral Daily   atorvastatin  80 mg Oral QHS   carvedilol  6.25 mg Oral BID WC   clopidogrel  75 mg Oral Daily   furosemide  40 mg Intravenous Q12H   gabapentin  300 mg Oral BID   glucose blood  1 each Other BID   insulin aspart  0-6 Units Subcutaneous Q4H   Continuous Infusions:  heparin 1,200 Units/hr (04/21/21 0559)   PRN Meds: acetaminophen, nitroGLYCERIN, traZODone  Allergies:    Allergies  Allergen Reactions   Cedar     Swelling    Other     Teriyaki- swelling    Social History:   Social History   Socioeconomic History   Marital status: Legally Separated    Spouse name: Not on file   Number of children: Not on file   Years of education: Not on file   Highest education level: Not on file  Occupational  History   Not on file  Tobacco Use   Smoking status: Never   Smokeless tobacco: Never  Vaping Use   Vaping Use: Never used  Substance and Sexual Activity   Alcohol use: No   Drug use: No   Sexual activity: Yes    Birth control/protection: None, Surgical    Comment: tubal  Other Topics Concern   Not on file  Social History Narrative   Lives alone.    Has some friends.    Neighbors.    Social Determinants of Health   Financial Resource Strain: Not on file  Food Insecurity: Not on file  Transportation Needs: Not on file  Physical Activity: Not on file  Stress: Not on file  Social Connections: Not on file  Intimate Partner Violence: Not on file    Family History:    Family History  Problem Relation Age of Onset   Migraines Mother    Heart disease Father    Hypertension Father    Diabetes Father    Migraines Maternal Grandfather    Heart attack Maternal Grandfather    Lung cancer Paternal Grandmother    Colon cancer Neg Hx    Colon polyps Neg Hx      ROS:  Please see the history of present illness.  General:no colds or fevers, no weight changes Skin:no rashes or ulcers HEENT:no blurred vision, no congestion CV:see HPI PUL:see HPI GI:no diarrhea constipation or melena, no indigestion GU:no hematuria, no dysuria MS:no joint pain, no claudication Neuro:no syncope, no lightheadedness Endo:+ diabetes, + thyroid disease  All other ROS reviewed and negative.     Physical Exam/Data:   Vitals:   04/21/21 0730 04/21/21 0745 04/21/21 1038 04/21/21 1040  BP: 138/87  127/84 (!) 133/94  Pulse: 68 69 68   Resp: _0 Temp:   97.7 F (36.5 C)   TempSrc:   Oral   SpO2: 92% 91% 95%   Weight:      Height:       No intake  or output data in the 24 hours ending 04/21/21 1254 Last 3 Weights 04/21/2021 03/24/2021 03/23/2021  Weight (lbs) 186 lb 192 lb 14.4 oz 198 lb 6.6 oz  Weight (kg) 84.369 kg 87.5 kg 90 kg     Body mass index is 35.14 kg/m.  General:  Well  nourished, well developed, in no acute distress  HEENT: normal Neck: no JVD Vascular: No carotid bruits; Distal pulses 2+ bilaterally Cardiac:  normal S1, S2; RRR; no murmur gallup rub or click Lungs:  clear to auscultation bilaterally, no wheezing, rhonchi or rales  Abd: soft, nontender, no hepatomegaly  Ext: no edema Musculoskeletal:  No deformities, BUE and BLE strength normal and equal Skin: warm and dry  Neuro:  CNs 2-12 intact, no focal abnormalities noted Psych:  Normal affect    Relevant CV Studies:  Echo: 03/20/21    IMPRESSIONS     1. Left ventricular ejection fraction, by estimation, is 40 to 45%. The  left ventricle has mildly decreased function. The left ventricle  demonstrates global hypokinesis. Left ventricular diastolic parameters are  consistent with Grade I diastolic  dysfunction (impaired relaxation). Elevated left atrial pressure.   2. Right ventricular systolic function is normal. The right ventricular  size is normal. Tricuspid regurgitation signal is inadequate for assessing  PA pressure.   3. The mitral valve is abnormal. Mild mitral valve regurgitation. No  evidence of mitral stenosis.   4. The aortic valve is tricuspid. There is moderate calcification of the  aortic valve. There is moderate thickening of the aortic valve. Aortic  valve regurgitation is not visualized. No aortic stenosis is present.   FINDINGS   Left Ventricle: Left ventricular ejection fraction, by estimation, is 40  to 45%. The left ventricle has mildly decreased function. The left  ventricle demonstrates global hypokinesis. The left ventricular internal  cavity size was normal in size. There is   no left ventricular hypertrophy. Left ventricular diastolic parameters  are consistent with Grade I diastolic dysfunction (impaired relaxation).  Elevated left atrial pressure.   Right Ventricle: The right ventricular size is normal. Right vetricular  wall thickness was not well  visualized. Right ventricular systolic  function is normal. Tricuspid regurgitation signal is inadequate for  assessing PA pressure.   Left Atrium: Left atrial size was normal in size.   Right Atrium: Right atrial size was normal in size.   Pericardium: There is no evidence of pericardial effusion.   Mitral Valve: The mitral valve is abnormal. There is mild thickening of  the mitral valve leaflet(s). There is mild calcification of the mitral  valve leaflet(s). Mild mitral annular calcification. Mild mitral valve  regurgitation. No evidence of mitral  valve stenosis.   Tricuspid Valve: The tricuspid valve is not well visualized. Tricuspid  valve regurgitation is not demonstrated. No evidence of tricuspid  stenosis.   Aortic Valve: The aortic valve is tricuspid. There is moderate  calcification of the aortic valve. There is moderate thickening of the  aortic valve. There is moderate aortic valve annular calcification. Aortic  valve regurgitation is not visualized. No  aortic stenosis is present. Aortic valve mean gradient measures 5.2 mmHg.  Aortic valve peak gradient measures 9.7 mmHg. Aortic valve area, by VTI  measures 1.53 cm.   Pulmonic Valve: The pulmonic valve was not well visualized. Pulmonic valve  regurgitation is not visualized. No evidence of pulmonic stenosis.   Aorta: The aortic root is normal in size and structure.   IAS/Shunts: No atrial level shunt  detected by color flow Doppler.      Cardiac cath: 03/23/2021 Conclusion       Mid RCA lesion is 100% stenosed.  Left to right collaterals.   2nd Mrg lesion is 90% stenosed.   Ost LAD to Mid LAD lesion is 75% stenosed.   1st Diag lesion is 75% stenosed.   LV end diastolic pressure is mildly elevated.   There is no aortic valve stenosis.   Severe, multivessel disease.  Diffuse disease throughout the left system.  Occluded mid RCA stent.  Large right PDA and distal RCA system which fills by collaterals from the  left.  Restart heparin in 8 hours.  Discussed with Dr. Martinique.  Will obtain CVTS consult.  Contrast minimize due to renal dysfunction. Diagnostic Dominance: Right Intervention   Laboratory Data:  High Sensitivity Troponin:   Recent Labs  Lab 04/21/21 0410 04/21/21 0616  TROPONINIHS 181* 845*     Chemistry Recent Labs  Lab 04/21/21 0410 04/21/21 0616  NA 135  --   K 3.9  --   CL 106  --   CO2 19*  --   GLUCOSE 344*  --   BUN 40*  --   CREATININE 3.33*  --   CALCIUM 8.8*  --   MG  --  2.2  GFRNONAA 16*  --   ANIONGAP 10  --     Recent Labs  Lab 04/21/21 0410  PROT 7.0  ALBUMIN 3.6  AST 11*  ALT 9  ALKPHOS 78  BILITOT 0.4   Lipids No results for input(s): CHOL, TRIG, HDL, LABVLDL, LDLCALC, CHOLHDL in the last 168 hours.  Hematology Recent Labs  Lab 04/21/21 0410  WBC 13.0*  RBC 3.24*  HGB 10.4*  HCT 33.3*  MCV 102.8*  MCH 32.1  MCHC 31.2  RDW 14.3  PLT 272   Thyroid No results for input(s): TSH, FREET4 in the last 168 hours.  BNP Recent Labs  Lab 04/21/21 0410  BNP 1,180.0*    DDimer No results for input(s): DDIMER in the last 168 hours.   Radiology/Studies:  DG Chest Portable 1 View  Result Date: 04/21/2021 CLINICAL DATA:  Chest pain, vomiting EXAM: PORTABLE CHEST 1 VIEW COMPARISON:  03/19/2021 FINDINGS: Lung volumes are small, but are symmetric and pulmonary insufflation is stable since prior examination. There has developed mild patchy interstitial pulmonary infiltrate, favored to be inflammatory or infectious in etiology. No pneumothorax. Small right pleural effusion. Mild cardiomegaly is stable. IMPRESSION: Interval development of patchy interstitial pulmonary infiltrate, favored to be inflammatory or infectious. Electronically Signed   By: Fidela Salisbury M.D.   On: 04/21/2021 03:51     Assessment and Plan:   ACS with elevated troponin in pt with known significant CAD. No acute EKG changes.  On IV heparin and ASA.  She had not had any  discomfort since discharge 03/24/21 until last pm.  Could add imdur 30 and or increase amlodipine.  Pt would like to leave by the 3rd to help daughter buy wedding dress. HLD on statin continue  Acute on chronic combined systolic and diastolic CHF with elevated BNP on IV lasix once this AM and now every 12 hours  no I&O will order and daily wts CKD 4-5 has been seen by nephrology 02/2021 in [facility]  Renal US w/o obstruction Hx CVA with blindness L eye  HTN elevated on arrival  DM2 per IM Anemia now 10.4 up from 8.8 03/24/21.  Due to chromic disease per IM  Risk Assessment/Risk Scores:     TIMI Risk Score for Unstable Angina or Non-ST Elevation MI:   The patient's TIMI risk score is 4, which indicates a 20% risk of all cause mortality, new or recurrent myocardial infarction or need for urgent revascularization in the next 14 days.  New York Heart Association (NYHA) Functional Class NYHA Class II        For questions or updates, please contact [facility] Please consult [email or web address] for contact info under    Signed, Cecilie Kicks, NP  04/21/2021 12:54 PM   Attending note  Patient seen an discussed with PA Dorene Ar, I agree with her documentation. 56 yo female history of CAD with prior BMS to RCA in 2004, HTN, HL, DM2, prior CVA, CKD IV  Admit Jan 2023 with NSTEMI. Cath Jan 2023 showed ostial LAD 75%, D1 75%, OM2 90%, RCA mid occlusion RPDA fills with collateral. Surgical anatomy. Seen by CT surgery who stated patient not surgical candidate due to her significant comorbid renal disease, deconditioning, poor targets for grafting, and recent COVID pneumonitis. Patient was treated medically.   Presents again with chest pain. Isolated episode that occurred after eating pizza. She thought was heart burn.   K 3.9 BUN 40 Cr 3.33 WBC 13 Hgb 10.4 Plt 272 [id number] Trop 181-->845--> EKG SR, inferior and lateral precordial TWIs that are chronic COVID neg flu neg   Recurrent NSTEMI, trop thys far to  845. EKG without new specific changes. Recent cath as reported above, she was turned down for surgery. Medical therapy with ASA, atorva 80, coreg 6.49m bid, plavix 75, hep gtt. I would imagine would just be medical therapy for her but will touch base with Dr VScarlette Calicowho had one her cath last month if in general she has PCI targets. WOuld have to way risk vs benefit particularly given renal dysfnction, and likely all things considered if pain controlled best option likely just medical therapy.   Acute on chronic HF, prior echo LVE 40-45%. Continue IV lasix. Medical therapy limited by renal dysfunction.    Heard back from Dr VScarlette Calico From film review could possibly get 2 small stents into her OM but small long area of stents in diabetic patient high risk for restenosis, the other disease not amenable to PCI. Essentially very high risk to kidneys for an intervention likely short term benefit and leaving significant residual disease. Will continue just medical therapy at this time. Add imdur 166mdaily to her regimen.      JoCarlyle DollyD

## 2021-04-21 NOTE — H&P (Addendum)
History and Physical    ALYLAH Carlson WJX:[id number] DOB: [birth year] DOA: 04/21/2021  PCP: Gwenlyn Saran Crouch   Patient coming from: Home   Chief Complaint: Chest pain   HPI: Monica Carlson is a pleasant 56 y.o. female with medical history significant for coronary artery disease, chronic combined systolic and diastolic CHF, hypertension, type 2 diabetes mellitus, hypothyroidism, and CKD 4, who presented to the emergency department after an episode of chest pain.  Patient reports that she had some mild shortness of breath and then developed central chest discomfort shortly after eating pizza at approximately 9 PM.  She suspected this was indigestion but was not sure and took 4 baby aspirin and called EMS.  EMS administered 1 nitroglycerin.  Pain resolved and patient is unsure if it resolved due to the nitroglycerin or because she vomited.  The chest discomfort was localized to the central chest, different than her prior chest pain.  She underwent left heart cath on March 23, 2021 which demonstrated severe multivessel disease and occluded RCA stent.  Cardiothoracic surgery saw the patient in consultation at that time and she was felt to be a poor candidate for surgery.  ED Course: Upon arrival to the ED, patient is found to be afebrile with normal heart rate and mild hypertension.  EKG features sinus rhythm with PVC, nonspecific IVCD, and diffuse T wave inversions similar to prior.  Chest x-ray notable for interval development of patchy interstitial infiltrate.  Chemistry panel features a glucose of 344 and creatinine of 3.33.  CBC notable for leukocytosis and microcytic anemia.  Troponin was 181 and BNP 1180.  Patient was started on IV heparin in the ED and given 40 mg IV Lasix.  Review of Systems:  All other systems reviewed and apart from HPI, are negative.  Past Medical History:  Diagnosis Date   Cervical cancer ([facility])    cryotherapy and colposcopy/[place], [place]; 1991    CHF (congestive heart failure) ([place]) 07-2014   Diabetes mellitus without complication ([place]) 2130   High cholesterol    Hypertension    MI (myocardial infarction) ([place])    2004   Stroke ([place]) 09/2014, 2013   Vaginal Pap smear, abnormal     Past Surgical History:  Procedure Laterality Date   CHOLECYSTECTOMY  1992   CORONARY STENT PLACEMENT  2004   LEFT HEART CATH AND CORONARY ANGIOGRAPHY N/A 03/23/2021   Procedure: LEFT HEART CATH AND CORONARY ANGIOGRAPHY;  Surgeon: Jettie Booze, MD;  Location: [facility];  Service: Cardiovascular;  Laterality: N/A;   TUBAL LIGATION      Social History:   reports that she has never smoked. She has never used smokeless tobacco. She reports that she does not drink alcohol and does not use drugs.  Allergies  Allergen Reactions   Cedar     Swelling    Other     Teriyaki- swelling    Family History  Problem Relation Age of Onset   Migraines Mother    Heart disease Father    Hypertension Father    Diabetes Father    Migraines Maternal Grandfather    Heart attack Maternal Grandfather    Lung cancer Paternal Grandmother    Colon cancer Neg Hx    Colon polyps Neg Hx      Prior to Admission medications   Medication Sig Start Date End Date Taking? Authorizing Provider  acetaminophen (TYLENOL) 500 MG tablet Take 1,000-1,500 mg by mouth every 6 (six) hours as needed  for mild pain.    [provider]  albuterol (VENTOLIN HFA) 108 (90 Base) MCG/ACT inhaler Inhale 2 puffs into the lungs every 4 (four) hours as needed for shortness of breath. 02/27/21   [provider]  amLODipine (NORVASC) 5 MG tablet Take 5 mg by mouth daily. 08/05/20   [provider]  aspirin EC 81 MG EC tablet Take 1 tablet (81 mg total) by mouth daily. Swallow whole. 03/25/21 06/23/21  [place] Donnamarie Poag, DO  atorvastatin (LIPITOR) 80 MG tablet Take 1 tablet (80 mg total) by mouth at bedtime. 03/24/21 06/22/21  [place] Donnamarie Poag, DO  Blood Glucose  Monitoring Suppl (ACCU-CHEK GUIDE ME) w/Device KIT 1 Piece by Does not apply route as directed. 07/09/18   Cassandria Anger, MD  carvedilol (COREG) 6.25 MG tablet Take 6.25 mg by mouth 2 (two) times daily. 02/09/21   [provider]  Cholecalciferol (VITAMIN D) 50 MCG (2000 UT) CAPS Take 2,000 Units by mouth daily.    [provider]  clopidogrel (PLAVIX) 75 MG tablet TAKE ONE TABLET BY MOUTH EVERY DAY WITH BREAKFAST Patient taking differently: Take 75 mg by mouth daily. 07/10/17   Caren Macadam, MD  diclofenac Sodium (VOLTAREN) 1 % GEL Apply 2 g topically 4 (four) times daily as needed (pain/swelling). 06/07/19   Petrucelli, Samantha R, PA-C  ezetimibe (ZETIA) 10 MG tablet Take 1 tablet (10 mg total) by mouth daily. 04/15/21   Sueanne Margarita, MD  furosemide (LASIX) 20 MG tablet Take 20 mg by mouth 2 (two) times daily.    [provider]  gabapentin (NEURONTIN) 300 MG capsule Take 300 mg by mouth 2 (two) times daily. 10/11/17   [provider]  glipiZIDE (GLUCOTROL) 10 MG tablet TAKE 1 TABLET BY MOUTH TWICE DAILY BEFORE MEALS 03/23/21   Rayetta Pigg J, NP  glucose blood (ACCU-CHEK GUIDE) test strip Use as instructed 4 x daily. e11.65 07/10/18   Cassandria Anger, MD  glucose blood test strip 1 each by Other route 2 (two) times daily. Use as instructed bid. E11.65 12/18/18   Cassandria Anger, MD  icosapent Ethyl (VASCEPA) 1 g capsule Take 2 capsules (2 g total) by mouth 2 (two) times daily. With food 04/15/21   Turner, Eber Hong, MD  JANUVIA 50 MG tablet TAKE 1 TABLET BY MOUTH EVERY DAY Patient taking differently: Take 50 mg by mouth daily. 02/23/21   Brita Romp, NP  Lancets Thin MISC 1 each by Does not apply route 4 (four) times daily. E11.65 Pt has Prodigy meter 10/02/18   Nida, Marella Chimes, MD  levothyroxine (SYNTHROID) 125 MCG tablet TAKE 1 TABLET BY MOUTH DAILY BEFORE BREAKFAST Patient taking differently: Take 125 mcg by mouth daily before  breakfast. 02/23/21   Brita Romp, NP  meclizine (ANTIVERT) 25 MG tablet Take 1 tablet (25 mg total) by mouth 3 (three) times daily as needed for dizziness. 11/05/20   Verta Ellen., NP  nitroGLYCERIN (NITROSTAT) 0.4 MG SL tablet Place 1 tablet (0.4 mg total) under the tongue every 5 (five) minutes as needed for chest pain. 08/15/17   Caren Macadam, MD  potassium chloride SA (KLOR-CON M) 20 MEQ tablet Take 20 mEq by mouth 2 (two) times daily. 03/03/21   [provider]  sodium bicarbonate 650 MG tablet Take 1 tablet (650 mg total) by mouth 2 (two) times daily. 03/24/21 06/22/21  [place] Donnamarie Poag, DO  traZODone (DESYREL) 50 MG tablet Take 50 mg by mouth  at bedtime as needed. 08/05/20   [provider]  vitamin C (ASCORBIC ACID) 500 MG tablet Take 500 mg by mouth daily. 02/27/21   [provider]  Vitamin D, Ergocalciferol, (DRISDOL) 50000 units CAPS capsule TAKE ONE CAPSULE BY MOUTH ONCE WEEKLY ON MONDAY 11/08/17   [provider]  Zinc 50 MG TABS Take 50 mg by mouth daily. 02/27/21   [provider]    Physical Exam: Vitals:   04/21/21 0300 04/21/21 0330 04/21/21 0400 04/21/21 0530  BP: (!) 162/92 (!) 152/97 (!) 155/97 (!) 151/93  Pulse: 89  79 70  Resp: _0 Temp: 98.4 F (36.9 C)     SpO2: 99% 94% 93% (!) 89%  Weight:      Height:        Constitutional: NAD, calm  Eyes: PERTLA, lids and conjunctivae normal ENMT: Mucous membranes are moist. Posterior pharynx clear of any exudate or lesions.   Neck: supple, no masses  Respiratory: no wheezing, fine rales bilaterally. No accessory muscle use.  Cardiovascular: S1 & S2 heard, regular rate and rhythm. Trace b/l lower leg edema.   Abdomen: No distension, no tenderness, soft. Bowel sounds active.  Musculoskeletal: no clubbing / cyanosis. No joint deformity upper and lower extremities.   Skin: no significant rashes, lesions, ulcers. Warm, dry, well-perfused. Neurologic: CN 2-12 grossly  intact. Moving all extremities. Alert and oriented.  Psychiatric: Pleasant. Cooperative.    Labs and Imaging on Admission: I have personally reviewed following labs and imaging studies  CBC: Recent Labs  Lab 04/21/21 0410  WBC 13.0*  NEUTROABS 11.0*  HGB 10.4*  HCT 33.3*  MCV 102.8*  PLT 016   Basic Metabolic Panel: Recent Labs  Lab 04/21/21 0410  NA 135  K 3.9  CL 106  CO2 19*  GLUCOSE 344*  BUN 40*  CREATININE 3.33*  CALCIUM 8.8*   GFR: Estimated Creatinine Clearance: 18.6 mL/min (A) (by C-G formula based on SCr of 3.33 mg/dL (H)). Liver Function Tests: Recent Labs  Lab 04/21/21 0410  AST 11*  ALT 9  ALKPHOS 78  BILITOT 0.4  PROT 7.0  ALBUMIN 3.6   Recent Labs  Lab 04/21/21 0410  LIPASE 54*   No results for input(s): AMMONIA in the last 168 hours. Coagulation Profile: No results for input(s): INR, PROTIME in the last 168 hours. Cardiac Enzymes: No results for input(s): CKTOTAL, CKMB, CKMBINDEX, TROPONINI in the last 168 hours. BNP (last 3 results) No results for input(s): PROBNP in the last 8760 hours. HbA1C: No results for input(s): HGBA1C in the last 72 hours. CBG: Recent Labs  Lab 04/21/21 0414  GLUCAP 323*   Lipid Profile: No results for input(s): CHOL, HDL, LDLCALC, TRIG, CHOLHDL, LDLDIRECT in the last 72 hours. Thyroid Function Tests: No results for input(s): TSH, T4TOTAL, FREET4, T3FREE, THYROIDAB in the last 72 hours. Anemia Panel: No results for input(s): VITAMINB12, FOLATE, FERRITIN, TIBC, IRON, RETICCTPCT in the last 72 hours. Urine analysis:    Component Value Date/Time   COLORURINE YELLOW 06/19/2014 0846   APPEARANCEUR TURBID (A) 06/19/2014 0846   LABSPEC 1.029 06/19/2014 0846   PHURINE 5.5 06/19/2014 0846   GLUCOSEU >1000 (A) 06/19/2014 0846   HGBUR MODERATE (A) 06/19/2014 0846   BILIRUBINUR NEGATIVE 06/19/2014 0846   KETONESUR NEGATIVE 06/19/2014 0846   PROTEINUR 30 (A) 06/19/2014 0846   UROBILINOGEN 0.2 06/19/2014 0846    NITRITE POSITIVE (A) 06/19/2014 0846   LEUKOCYTESUR LARGE (A) 06/19/2014 0846   Sepsis Labs: @  LABRCNTIP(procalcitonin:4,lacticidven:4) )No results found for this or any previous visit (from the past 240 hour(s)).   Radiological Exams on Admission: DG Chest Portable 1 View  Result Date: 04/21/2021 CLINICAL DATA:  Chest pain, vomiting EXAM: PORTABLE CHEST 1 VIEW COMPARISON:  03/19/2021 FINDINGS: Lung volumes are small, but are symmetric and pulmonary insufflation is stable since prior examination. There has developed mild patchy interstitial pulmonary infiltrate, favored to be inflammatory or infectious in etiology. No pneumothorax. Small right pleural effusion. Mild cardiomegaly is stable. IMPRESSION: Interval development of patchy interstitial pulmonary infiltrate, favored to be inflammatory or infectious. Electronically Signed   By: Fidela Salisbury M.D.   On: 04/21/2021 03:51    EKG: Independently reviewed. Sinus rhythm, PVC, non-specific IVCD, T-wave inversions.   Assessment/Plan   1. Chest pain  - Pt with known multivessel CAD presents with chest pain after eating pizza that she attributes to indigestion and resolved after vomiting and taking ASA 324 mg and 1 NTG  - Initial troponin is 181 and EKG has T-wave inversions that appear similar to prior EKG  - She was started on IV heparin in ED  - She has been pain-free in ED, unclear if this was cardiac pain or indigestion, plan to continue IV heparin pending repeat troponin, continue beta-blocker, statin, and antiplatelets    ADDENDUM: Second troponin 845. She remains pain-free. Will request cardiology consultation.    2. Acute on chronic combined systolic & diastolic CHF  - She reports SOB, has increased BNP, and new interstitial infiltrates on CXR without cough or fever more suspicious for CHF than pneumonia clinically  - She was given IV Lasix in ED  - Continue diuresis with IV Lasix, monitor wt and I/O, continue Coreg    3. CKD IV   - SCr is 3.33 on admission, similar to priors  - Renally-dose medications, monitor    4. Type II DM  - A1c was 8.2% in Jan 2023  - Check CBGs and use SSI for now    5. Hypothyroidism  - Continue Synthroid    6. Prolonged QT interval - QTc is 516 ms in ED  - Check magnesium level, avoid QT-prolonging medications    DVT prophylaxis: IV heparin  Code Status: Full  Level of Care: Level of care: Telemetry Family Communication: none present  Disposition Plan:  Patient is from: home  Anticipated d/c is to: Home Anticipated d/c date is: 04/22/21 Patient currently: Pending trend in troponin, cardiac monitoring  Consults called: none  Admission status: Observation     Vianne Bulls, MD [facility]  04/21/2021, 6:52 AM

## 2021-04-21 NOTE — Progress Notes (Signed)
?  Transition of Care (TOC) Screening Note ? ? ?Patient Details  ?Name: Monica Carlson ?Date of Birth: 08-17-1965 ? ? ?Transition of Care (TOC) CM/SW Contact:    ?Boneta Lucks, RN ?Phone Number: ?04/21/2021, 1:50 PM ? ? ? ?Transition of Care Department [facility]) has reviewed patient and no TOC needs have been identified at this time. We will continue to monitor patient advancement through interdisciplinary progression rounds. If new patient transition needs arise, please place a TOC consult. ? ? ?

## 2021-04-21 NOTE — Progress Notes (Signed)
ANTICOAGULATION CONSULT NOTE - Follow Up Consult ? ?Pharmacy Consult for Heparin ?Indication: chest pain/ACS ? ?Allergies  ?Allergen Reactions  ? Cedar   ?  Swelling ?  ? Other   ?  Teriyaki- swelling  ? ? ?Patient Measurements: ?Height: 5\' 1"  (154.9 cm) ?Weight: 83.4 kg (183 lb 12.8 oz) ?IBW/kg (Calculated) : 47.8 ?Heparin Dosing Weight: 67.1 ? ? ?Labs: ?Recent Labs  ?  04/21/21 ?0410 04/21/21 ?0616 04/21/21 ?1351 04/21/21 ?2207  ?HGB 10.4*  --   --   --   ?HCT 33.3*  --   --   --   ?PLT 272  --   --   --   ?HEPARINUNFRC  --   --  0.29* 0.15*  ?CREATININE 3.33*  --   --   --   ?TROPONINIHS 181* 845*  --   --   ? ? ? ?Estimated Creatinine Clearance: 18.5 mL/min (A) (by C-G formula based on SCr of 3.33 mg/dL (H)). ? ?Assessment: ?56 y/o F with known CAD/CP to continue medical therapy as patient not a candidate for CABG. Follow up heparin level still below goal at 0.15. No adverse events noted.  ? ?Heparin paused earlier, restarted about 5 hours ago. May need a little more time to be at true steady state, will make small rate adjustment.  ? ?Goal of Therapy:  ?Heparin level 0.3-0.7 units/ml ?Monitor platelets by anticoagulation protocol: Yes ?  ?Plan:  ?Increase heparin rate to 1450 units/hr ?Recheck heparin level with AM labs ? ?Erin Hearing PharmD., BCPS ?Clinical Pharmacist ?04/21/2021 10:54 PM ? ?

## 2021-04-21 NOTE — ED Triage Notes (Signed)
Pt arrived [facility] from home with c/o chest pain that started around 1:30pm after eating pizza. Pt was given 1 nitro before arriving that relieved chest pain. States she has vomited 3x tonight.  ?

## 2021-04-21 NOTE — ED Notes (Signed)
ED Provider at bedside. 

## 2021-04-21 NOTE — ED Notes (Signed)
BGL 247

## 2021-04-21 NOTE — Progress Notes (Signed)
ANTICOAGULATION CONSULT NOTE - Follow Up Consult ? ?Pharmacy Consult for Heparin ?Indication: chest pain/ACS ? ?Allergies  ?Allergen Reactions  ? Cedar   ?  Swelling ?  ? Other   ?  Teriyaki- swelling  ? ? ?Patient Measurements: ?Height: 5\' 1"  (154.9 cm) ?Weight: 84.4 kg (186 lb) ?IBW/kg (Calculated) : 47.8 ?Heparin Dosing Weight: 67.1 ? ? ?Labs: ?Recent Labs  ?  04/21/21 ?0410 04/21/21 ?0616 04/21/21 ?1351  ?HGB 10.4*  --   --   ?HCT 33.3*  --   --   ?PLT 272  --   --   ?HEPARINUNFRC  --   --  0.29*  ?CREATININE 3.33*  --   --   ?TROPONINIHS 181* 845*  --   ? ? ?Estimated Creatinine Clearance: 18.6 mL/min (A) (by C-G formula based on SCr of 3.33 mg/dL (H)). ? ?Assessment: ?56 y/o F with known CAD/CP to continue medical therapy as patient not a candidate for CABG. Initial heparin level near goal. No adverse events noted.  ? ?Goal of Therapy:  ?Heparin level 0.3-0.7 units/ml ?Monitor platelets by anticoagulation protocol: Yes ?  ?Plan:  ?Increase heparin rate to 1300 units/hr ?Recheck heparin level with AM labs ? ?Lorenso Courier Chika ?04/21/2021,2:43 PM ? ? ?

## 2021-04-21 NOTE — Plan of Care (Signed)
?  Problem: Education: ?Goal: Knowledge of General Education information will improve ?Description: Including pain rating scale, medication(s)/side effects and non-pharmacologic comfort measures ?Outcome: Progressing ?  ?Problem: Health Behavior/Discharge Planning: ?Goal: Ability to manage health-related needs will improve ?Outcome: Progressing ?  ?Problem: Clinical Measurements: ?Goal: Ability to maintain clinical measurements within normal limits will improve ?Outcome: Progressing ?  ?Problem: Clinical Measurements: ?Goal: Cardiovascular complication will be avoided ?Outcome: Progressing ?  ?Problem: Activity: ?Goal: Risk for activity intolerance will decrease ?Outcome: Progressing ?  ?Problem: Nutrition: ?Goal: Adequate nutrition will be maintained ?Outcome: Progressing ?  ?Problem: Coping: ?Goal: Level of anxiety will decrease ?Outcome: Progressing ?  ?Problem: Pain Managment: ?Goal: General experience of comfort will improve ?Outcome: Progressing ?  ?Problem: Safety: ?Goal: Ability to remain free from injury will improve ?Outcome: Progressing ?  ?

## 2021-04-22 DIAGNOSIS — I252 Old myocardial infarction: Secondary | ICD-10-CM | POA: Diagnosis not present

## 2021-04-22 DIAGNOSIS — I5043 Acute on chronic combined systolic (congestive) and diastolic (congestive) heart failure: Secondary | ICD-10-CM | POA: Diagnosis present

## 2021-04-22 DIAGNOSIS — I130 Hypertensive heart and chronic kidney disease with heart failure and stage 1 through stage 4 chronic kidney disease, or unspecified chronic kidney disease: Secondary | ICD-10-CM | POA: Diagnosis present

## 2021-04-22 DIAGNOSIS — I493 Ventricular premature depolarization: Secondary | ICD-10-CM | POA: Diagnosis present

## 2021-04-22 DIAGNOSIS — E1165 Type 2 diabetes mellitus with hyperglycemia: Secondary | ICD-10-CM

## 2021-04-22 DIAGNOSIS — Z6833 Body mass index (BMI) 33.0-33.9, adult: Secondary | ICD-10-CM

## 2021-04-22 DIAGNOSIS — E039 Hypothyroidism, unspecified: Secondary | ICD-10-CM | POA: Diagnosis present

## 2021-04-22 DIAGNOSIS — Z8616 Personal history of COVID-19: Secondary | ICD-10-CM | POA: Diagnosis not present

## 2021-04-22 DIAGNOSIS — Z635 Disruption of family by separation and divorce: Secondary | ICD-10-CM | POA: Diagnosis not present

## 2021-04-22 DIAGNOSIS — E1122 Type 2 diabetes mellitus with diabetic chronic kidney disease: Secondary | ICD-10-CM | POA: Diagnosis present

## 2021-04-22 DIAGNOSIS — I5023 Acute on chronic systolic (congestive) heart failure: Secondary | ICD-10-CM | POA: Diagnosis not present

## 2021-04-22 DIAGNOSIS — Z20822 Contact with and (suspected) exposure to covid-19: Secondary | ICD-10-CM | POA: Diagnosis present

## 2021-04-22 DIAGNOSIS — Z8249 Family history of ischemic heart disease and other diseases of the circulatory system: Secondary | ICD-10-CM | POA: Diagnosis not present

## 2021-04-22 DIAGNOSIS — Z8673 Personal history of transient ischemic attack (TIA), and cerebral infarction without residual deficits: Secondary | ICD-10-CM | POA: Diagnosis not present

## 2021-04-22 DIAGNOSIS — R0789 Other chest pain: Secondary | ICD-10-CM | POA: Diagnosis present

## 2021-04-22 DIAGNOSIS — Z833 Family history of diabetes mellitus: Secondary | ICD-10-CM | POA: Diagnosis not present

## 2021-04-22 DIAGNOSIS — N184 Chronic kidney disease, stage 4 (severe): Secondary | ICD-10-CM | POA: Diagnosis present

## 2021-04-22 DIAGNOSIS — E669 Obesity, unspecified: Secondary | ICD-10-CM | POA: Diagnosis present

## 2021-04-22 DIAGNOSIS — R079 Chest pain, unspecified: Secondary | ICD-10-CM | POA: Diagnosis not present

## 2021-04-22 DIAGNOSIS — D631 Anemia in chronic kidney disease: Secondary | ICD-10-CM | POA: Diagnosis present

## 2021-04-22 DIAGNOSIS — I255 Ischemic cardiomyopathy: Secondary | ICD-10-CM | POA: Diagnosis present

## 2021-04-22 DIAGNOSIS — I2510 Atherosclerotic heart disease of native coronary artery without angina pectoris: Secondary | ICD-10-CM | POA: Diagnosis present

## 2021-04-22 DIAGNOSIS — Z8541 Personal history of malignant neoplasm of cervix uteri: Secondary | ICD-10-CM | POA: Diagnosis not present

## 2021-04-22 DIAGNOSIS — I214 Non-ST elevation (NSTEMI) myocardial infarction: Secondary | ICD-10-CM | POA: Diagnosis present

## 2021-04-22 DIAGNOSIS — E782 Mixed hyperlipidemia: Secondary | ICD-10-CM

## 2021-04-22 DIAGNOSIS — D509 Iron deficiency anemia, unspecified: Secondary | ICD-10-CM | POA: Diagnosis present

## 2021-04-22 DIAGNOSIS — Z8701 Personal history of pneumonia (recurrent): Secondary | ICD-10-CM | POA: Diagnosis not present

## 2021-04-22 DIAGNOSIS — Z6835 Body mass index (BMI) 35.0-35.9, adult: Secondary | ICD-10-CM | POA: Diagnosis not present

## 2021-04-22 DIAGNOSIS — R9431 Abnormal electrocardiogram [ECG] [EKG]: Secondary | ICD-10-CM

## 2021-04-22 LAB — BASIC METABOLIC PANEL
Anion gap: 9 (ref 5–15)
BUN: 38 mg/dL — ABNORMAL HIGH (ref 6–20)
CO2: 19 mmol/L — ABNORMAL LOW (ref 22–32)
Calcium: 8.6 mg/dL — ABNORMAL LOW (ref 8.9–10.3)
Chloride: 109 mmol/L (ref 98–111)
Creatinine, Ser: 3.27 mg/dL — ABNORMAL HIGH (ref 0.44–1.00)
GFR, Estimated: 16 mL/min — ABNORMAL LOW (ref >60)
Glucose, Bld: 163 mg/dL — ABNORMAL HIGH (ref 70–99)
Potassium: 3.2 mmol/L — ABNORMAL LOW (ref 3.5–5.1)
Sodium: 137 mmol/L (ref 135–145)

## 2021-04-22 LAB — GLUCOSE, CAPILLARY
Glucose-Capillary: 125 mg/dL — ABNORMAL HIGH (ref 70–99)
Glucose-Capillary: 155 mg/dL — ABNORMAL HIGH (ref 70–99)
Glucose-Capillary: 170 mg/dL — ABNORMAL HIGH (ref 70–99)
Glucose-Capillary: 175 mg/dL — ABNORMAL HIGH (ref 70–99)
Glucose-Capillary: 193 mg/dL — ABNORMAL HIGH (ref 70–99)
Glucose-Capillary: 218 mg/dL — ABNORMAL HIGH (ref 70–99)
Glucose-Capillary: 258 mg/dL — ABNORMAL HIGH (ref 70–99)
Glucose-Capillary: 332 mg/dL — ABNORMAL HIGH (ref 70–99)

## 2021-04-22 LAB — CBC
HCT: 30.1 % — ABNORMAL LOW (ref 36.0–46.0)
Hemoglobin: 9.4 g/dL — ABNORMAL LOW (ref 12.0–15.0)
MCH: 32.1 pg (ref 26.0–34.0)
MCHC: 31.2 g/dL (ref 30.0–36.0)
MCV: 102.7 fL — ABNORMAL HIGH (ref 80.0–100.0)
Platelets: 260 10*3/uL (ref 150–400)
RBC: 2.93 MIL/uL — ABNORMAL LOW (ref 3.87–5.11)
RDW: 14.4 % (ref 11.5–15.5)
WBC: 10 10*3/uL (ref 4.0–10.5)
nRBC: 0 % (ref 0.0–0.2)

## 2021-04-22 LAB — HEPARIN LEVEL (UNFRACTIONATED)
Heparin Unfractionated: 0.64 IU/mL (ref 0.30–0.70)
Heparin Unfractionated: 0.76 IU/mL — ABNORMAL HIGH (ref 0.30–0.70)
Heparin Unfractionated: 0.44 IU/mL (ref 0.30–0.70)

## 2021-04-22 LAB — TROPONIN I (HIGH SENSITIVITY): Troponin I (High Sensitivity): 867 ng/L (ref <18)

## 2021-04-22 MED ORDER — POTASSIUM CHLORIDE CRYS ER 20 MEQ PO TBCR
40.0000 meq | EXTENDED_RELEASE_TABLET | Freq: Once | ORAL | Status: AC
  Administered 2021-04-22: 40 meq via ORAL
  Filled 2021-04-22: qty 2

## 2021-04-22 MED ORDER — INSULIN ASPART 100 UNIT/ML IJ SOLN
0.0000 [IU] | Freq: Every day | INTRAMUSCULAR | Status: DC
  Administered 2021-04-22: 3 [IU] via SUBCUTANEOUS

## 2021-04-22 MED ORDER — INSULIN ASPART 100 UNIT/ML IJ SOLN
0.0000 [IU] | Freq: Three times a day (TID) | INTRAMUSCULAR | Status: DC
  Administered 2021-04-23: 1 [IU] via SUBCUTANEOUS
  Administered 2021-04-23: 5 [IU] via SUBCUTANEOUS

## 2021-04-22 MED ORDER — EMPAGLIFLOZIN 10 MG PO TABS
10.0000 mg | ORAL_TABLET | Freq: Every day | ORAL | Status: DC
  Administered 2021-04-22 – 2021-04-23 (×3): 10 mg via ORAL
  Filled 2021-04-22 (×5): qty 1

## 2021-04-22 NOTE — Progress Notes (Signed)
ANTICOAGULATION CONSULT NOTE - Follow Up Consult ? ?Pharmacy Consult for Heparin ?Indication: chest pain/ACS ? ?Allergies  ?Allergen Reactions  ? Cedar   ?  Swelling ?  ? Other   ?  Teriyaki- swelling  ? ? ?Patient Measurements: ?Height: 5\' 1"  (154.9 cm) ?Weight: 83.4 kg (183 lb 12.8 oz) ?IBW/kg (Calculated) : 47.8 ?Heparin Dosing Weight: 67.1 ? ? ?Labs: ?Recent Labs  ?  04/21/21 ?0410 04/21/21 ?0616 04/21/21 ?1351 04/21/21 ?2207 04/22/21 ?0272  ?HGB 10.4*  --   --   --  9.4*  ?HCT 33.3*  --   --   --  30.1*  ?PLT 272  --   --   --  260  ?HEPARINUNFRC  --   --  0.29* 0.15* 0.64  ?CREATININE 3.33*  --   --   --  3.27*  ?TROPONINIHS 181* 845*  --   --  867*  ? ? ? ?Estimated Creatinine Clearance: 18.8 mL/min (A) (by C-G formula based on SCr of 3.27 mg/dL (H)). ? ?Assessment: ?56 y/o F with known CAD/CP to continue medical therapy as patient not a candidate for CABG. Follow up heparin level still below goal at 0.15. No adverse events noted.  ? ?Heparin 0.64, therapeutic  ? ?Goal of Therapy:  ?Heparin level 0.3-0.7 units/ml ?Monitor platelets by anticoagulation protocol: Yes ?  ?Plan:  ?Continue heparin rate @ 1450 units/hr ?Recheck heparin level in 6 hours ? ?Donna Christen Emmauel Hallums, PharmD, MBA ?Clinical Pharmacist ? ?04/22/2021 7:30 AM ? ?

## 2021-04-22 NOTE — Assessment & Plan Note (Addendum)
-  Continue to follow daily weights and low-sodium diet. ?-Discharged on adjusted dose of Lasix 40 mg twice a day. ?-Continue to follow cardiology service recommendations and medication management; outpatient appointment will be arranged for her. ?

## 2021-04-22 NOTE — Assessment & Plan Note (Addendum)
-  Overall stable and at baseline for her. ?-Continue to follow renal function trend intermittently. ?-Continue to avoid the use of nephrotoxic agents, contrast and hypotension. ?-Maintain adequate hydration. ?

## 2021-04-22 NOTE — Progress Notes (Signed)
ANTICOAGULATION CONSULT NOTE - Follow Up Consult ? ?Pharmacy Consult for Heparin ?Indication: chest pain/ACS ? ?Allergies  ?Allergen Reactions  ? Cedar   ?  Swelling ?  ? Other   ?  Teriyaki- swelling  ? ? ?Patient Measurements: ?Height: 5\' 1"  (154.9 cm) ?Weight: 83.4 kg (183 lb 12.8 oz) ?IBW/kg (Calculated) : 47.8 ?Heparin Dosing Weight: 67.1 ? ? ?Labs: ?Recent Labs  ?  04/21/21 ?0410 04/21/21 ?0616 04/21/21 ?1351 04/22/21 ?3329 04/22/21 ?1235 04/22/21 ?1936  ?HGB 10.4*  --   --  9.4*  --   --   ?HCT 33.3*  --   --  30.1*  --   --   ?PLT 272  --   --  260  --   --   ?HEPARINUNFRC  --   --    < > 0.64 0.76* 0.44  ?CREATININE 3.33*  --   --  3.27*  --   --   ?TROPONINIHS 181* 845*  --  867*  --   --   ? < > = values in this interval not displayed.  ? ? ? ?Estimated Creatinine Clearance: 18.8 mL/min (A) (by C-G formula based on SCr of 3.27 mg/dL (H)). ? ?Assessment: ?56 y/o F with known CAD/CP to continue medical therapy as patient not a candidate for CABG.  ? ?Heparin 0.44, therapeutic  ? ?Goal of Therapy:  ?Heparin level 0.3-0.7 units/ml ?Monitor platelets by anticoagulation protocol: Yes ?  ?Plan:  ?Continue heparin rate @ 1250 units/hr ?Recheck heparin level in 6 hours ? ? ?04/22/2021 9:09 PM ? ?

## 2021-04-22 NOTE — Assessment & Plan Note (Addendum)
-  Continue avoiding medications that can prolong QT ?-Continue intermittent monitoring of EKG to follow QT stability.Marland Kitchen ?

## 2021-04-22 NOTE — Assessment & Plan Note (Addendum)
-  Will discontinue the use of Jardiance due to stage IV renal failure with GFR less than 30%. ?-Continue Januvia daily and start treatment with Levemir ?-Close monitoring of patient's CBGs with further adjustment to hypoglycemia regimen as required. ?-A1c 8.2 suggesting uncontrolled diabetes. ?-Patient with nephropathy/kidney complications. ? ?

## 2021-04-22 NOTE — Assessment & Plan Note (Addendum)
-   As mentioned above patient presented with NSTEMI ?-Multiple vessels disease without the chance of revascularization surgery. ?-Patient completed 48 hours of heparin drip infusion and will follow optimize medication regimen as recommended by cardiology service.   ?-At discharge no chest pain or shortness of breath.  ?

## 2021-04-22 NOTE — Assessment & Plan Note (Addendum)
-  Class I obesity ?-Low calorie diet and portion control discussed with patient. ?-Body mass index is 34.73 kg/m?Marland Kitchen ? ?

## 2021-04-22 NOTE — Progress Notes (Signed)
Progress Note  Patient Name: Monica Carlson Date of Encounter: 04/22/2021  [facility] Cardiologist: Dr. Zandra Abts  Subjective   No events overnight.  No further chest pain.  She is disappoint that PCI is not the best intervention for her.  She is hoping for DC tomorrow so she can go wedding dress shopping with her daughter.  Inpatient Medications    Scheduled Meds:  amLODipine  5 mg Oral Daily   aspirin EC  81 mg Oral Daily   atorvastatin  80 mg Oral QHS   carvedilol  6.25 mg Oral BID WC   clopidogrel  75 mg Oral Daily   furosemide  40 mg Intravenous Q12H   gabapentin  300 mg Oral BID   insulin aspart  0-6 Units Subcutaneous Q4H   isosorbide mononitrate  15 mg Oral Daily   Continuous Infusions:  heparin 1,450 Units/hr (04/22/21 0158)   PRN Meds: acetaminophen, nitroGLYCERIN, traZODone   Vital Signs    Vitals:   04/21/21 2010 04/22/21 0117 04/22/21 0407 04/22/21 0756  BP: 107/61 (!) 103/59 102/61 139/88  Pulse: 60 63 64 63  Resp: '18 18 18   ' Temp: 97.6 F (36.4 C)     TempSrc: Oral     SpO2: 94% 97% 94% 95%  Weight:      Height:        Intake/Output Summary (Last 24 hours) at 04/22/2021 0803 Last data filed at 04/21/2021 2340 Gross per 24 hour  Intake 577.71 ml  Output --  Net 577.71 ml   Last 3 Weights 04/21/2021 04/21/2021 03/24/2021  Weight (lbs) 183 lb 12.8 oz 186 lb 192 lb 14.4 oz  Weight (kg) 83.371 kg 84.369 kg 87.5 kg      Telemetry    NA   Physical Exam   GEN: No acute distress.   Neck: No JVD Cardiac: RRR, no murmurs, rubs, or gallops.  Respiratory: Clear to auscultation bilaterally. GI: Soft, nontender, non-distended  MS: No edema; No deformity. Neuro:  Nonfocal- decrease vision Psych: Normal affect   Labs    High Sensitivity Troponin:   Recent Labs  Lab 04/21/21 0410 04/21/21 0616 04/22/21 0549  TROPONINIHS 181* 845* 867*     Chemistry Recent Labs  Lab 04/21/21 0410 04/21/21 0616 04/22/21 0549  NA 135  --  137  K 3.9  --   3.2*  CL 106  --  109  CO2 19*  --  19*  GLUCOSE 344*  --  163*  BUN 40*  --  38*  CREATININE 3.33*  --  3.27*  CALCIUM 8.8*  --  8.6*  MG  --  2.2  --   PROT 7.0  --   --   ALBUMIN 3.6  --   --   AST 11*  --   --   ALT 9  --   --   ALKPHOS 78  --   --   BILITOT 0.4  --   --   GFRNONAA 16*  --  16*  ANIONGAP 10  --  9    Lipids No results for input(s): CHOL, TRIG, HDL, LABVLDL, LDLCALC, CHOLHDL in the last 168 hours.  Hematology Recent Labs  Lab 04/21/21 0410 04/22/21 0549  WBC 13.0* 10.0  RBC 3.24* 2.93*  HGB 10.4* 9.4*  HCT 33.3* 30.1*  MCV 102.8* 102.7*  MCH 32.1 32.1  MCHC 31.2 31.2  RDW 14.3 14.4  PLT 272 260   Thyroid No results for input(s): TSH, FREET4 in  the last 168 hours.  BNP Recent Labs  Lab 04/21/21 0410  BNP 1,180.0*    DDimer No results for input(s): DDIMER in the last 168 hours.   Radiology    DG Chest Portable 1 View  Result Date: 04/21/2021 CLINICAL DATA:  Chest pain, vomiting EXAM: PORTABLE CHEST 1 VIEW COMPARISON:  03/19/2021 FINDINGS: Lung volumes are small, but are symmetric and pulmonary insufflation is stable since prior examination. There has developed mild patchy interstitial pulmonary infiltrate, favored to be inflammatory or infectious in etiology. No pneumothorax. Small right pleural effusion. Mild cardiomegaly is stable. IMPRESSION: Interval development of patchy interstitial pulmonary infiltrate, favored to be inflammatory or infectious. Electronically Signed   By: Fidela Salisbury M.D.   On: 04/21/2021 03:51    Cardiac Studies   Echo: 03/20/21    IMPRESSIONS     1. Left ventricular ejection fraction, by estimation, is 40 to 45%. The  left ventricle has mildly decreased function. The left ventricle  demonstrates global hypokinesis. Left ventricular diastolic parameters are  consistent with Grade I diastolic  dysfunction (impaired relaxation). Elevated left atrial pressure.   2. Right ventricular systolic function is normal. The  right ventricular  size is normal. Tricuspid regurgitation signal is inadequate for assessing  PA pressure.   3. The mitral valve is abnormal. Mild mitral valve regurgitation. No  evidence of mitral stenosis.   4. The aortic valve is tricuspid. There is moderate calcification of the  aortic valve. There is moderate thickening of the aortic valve. Aortic  valve regurgitation is not visualized. No aortic stenosis is present.   FINDINGS   Left Ventricle: Left ventricular ejection fraction, by estimation, is 40  to 45%. The left ventricle has mildly decreased function. The left  ventricle demonstrates global hypokinesis. The left ventricular internal  cavity size was normal in size. There is   no left ventricular hypertrophy. Left ventricular diastolic parameters  are consistent with Grade I diastolic dysfunction (impaired relaxation).  Elevated left atrial pressure.   Right Ventricle: The right ventricular size is normal. Right vetricular  wall thickness was not well visualized. Right ventricular systolic  function is normal. Tricuspid regurgitation signal is inadequate for  assessing PA pressure.   Left Atrium: Left atrial size was normal in size.   Right Atrium: Right atrial size was normal in size.   Pericardium: There is no evidence of pericardial effusion.   Mitral Valve: The mitral valve is abnormal. There is mild thickening of  the mitral valve leaflet(s). There is mild calcification of the mitral  valve leaflet(s). Mild mitral annular calcification. Mild mitral valve  regurgitation. No evidence of mitral  valve stenosis.   Tricuspid Valve: The tricuspid valve is not well visualized. Tricuspid  valve regurgitation is not demonstrated. No evidence of tricuspid  stenosis.   Aortic Valve: The aortic valve is tricuspid. There is moderate  calcification of the aortic valve. There is moderate thickening of the  aortic valve. There is moderate aortic valve annular calcification.  Aortic  valve regurgitation is not visualized. No  aortic stenosis is present. Aortic valve mean gradient measures 5.2 mmHg.  Aortic valve peak gradient measures 9.7 mmHg. Aortic valve area, by VTI  measures 1.53 cm.   Pulmonic Valve: The pulmonic valve was not well visualized. Pulmonic valve  regurgitation is not visualized. No evidence of pulmonic stenosis.   Aorta: The aortic root is normal in size and structure.   IAS/Shunts: No atrial level shunt detected by color flow Doppler.  Cardiac cath: 03/23/2021 Conclusion       Mid RCA lesion is 100% stenosed.  Left to right collaterals.   2nd Mrg lesion is 90% stenosed.   Ost LAD to Mid LAD lesion is 75% stenosed.   1st Diag lesion is 75% stenosed.   LV end diastolic pressure is mildly elevated.   There is no aortic valve stenosis.   Severe, multivessel disease.  Diffuse disease throughout the left system.  Occluded mid RCA stent.  Large right PDA and distal RCA system which fills by collaterals from the left.  Restart heparin in 8 hours.  Discussed with Dr. [place].  Will obtain CVTS consult.  Contrast minimize due to renal dysfunction. Diagnostic Dominance: Right Intervention  Patient Profile     56 y.o. female hx of CAD prior RCA stent, cardiomyopathy chronic combined systolic and diastolic CHF, CKD-4 HTN, HLD DM CVAs and recent NSTEMI with significant CAD that would require CABG but with TCTS consult not felt to be a candidate due to renal disease, deconditioning, poor targets for grafting, recent COVID plans to treat medically who is being seen 04/21/2021 for the evaluation of chest pain after eating pizza but relieved with 1 sl NTG , + vomiting at the request of Dr. Dyann Kief and with elevated troponin.   Assessment & Plan      NSTEMI CAD HFrEF Ischemic cardiomyopathy CKD Stage IV AOCD HTN with DM Hypkalemia- repleated - heparin for 48 hours (this is day 2), continue ASA and plavix; has plans for outpatient eval and  potential TCTS eval if unable to medical therapy - continue atorvastation 80 - coreg 6.25 mg PO BBID - On new IMDUR 15 is CP free - will go home with PRN nitro - eGFR has been above 20; will start Jardiance 10 and will need follow up BMP and stop if < 20 - needs outpatient nephrology       For questions or updates, please contact [facility] Please consult [email or web address] for contact info under        Signed, Cecilie Kicks, NP  04/22/2021, 8:03 AM

## 2021-04-22 NOTE — Progress Notes (Signed)
Inpatient Diabetes Program Recommendations ? ?AACE/ADA: New Consensus Statement on Inpatient Glycemic Control (2015) ? ?Target Ranges:  Prepandial:   less than 140 mg/dL ?     Peak postprandial:   less than 180 mg/dL (1-2 hours) ?     Critically ill patients:  140 - 180 mg/dL  ? ?Lab Results  ?Component Value Date  ? GLUCAP 332 (H) 04/22/2021  ? HGBA1C 8.2 (H) 03/20/2021  ? ? ?Review of Glycemic Control ? Latest Reference Range & Units 04/22/21 01:48 04/22/21 04:33 04/22/21 07:17 04/22/21 11:14  ?Glucose-Capillary 70 - 99 mg/dL 175 (H) 155 (H) 125 (H) 332 (H)  ? ?Diabetes history: DM 2 ?Outpatient Diabetes medications: Glucotrol 10 mg bid, Januvia 50 mg daily ?Current orders for Inpatient glycemic control:  ?Novolog 0-6 units q 4 hours ?Jardiance 10 mg daily ? ?Inpatient Diabetes Program Recommendations:   ? ?Consider changing Novolog correction to sensitive (0-9 units) tid with meals with HS scale.  ? ?Thanks,  ?Adah Perl, RN, BC-ADM ?Inpatient Diabetes Coordinator ?Pager [phone]  (8a-5p) ? ? ?

## 2021-04-22 NOTE — Assessment & Plan Note (Addendum)
-  Continue Synthroid. ?-Repeat thyroid panel follow-up visit ?-Patient has been educated about taking Synthroid on empty stomach. ?

## 2021-04-22 NOTE — Progress Notes (Signed)
?Progress Note ? ? ?Patient: NIGERIA LASSETER OYD:[id number] DOB: September 01, 1965 DOA: 04/21/2021     0 ?DOS: the patient was seen and examined on 04/22/2021 ?  ?Brief hospital admission narrative  course: ?As per H&P written by Dr. Myna Hidalgo on 04/20/2021 ?KEISHA AMER is a pleasant 56 y.o. female with medical history significant for coronary artery disease, chronic combined systolic and diastolic CHF, hypertension, type 2 diabetes mellitus, hypothyroidism, and CKD 4, who presented to the emergency department after an episode of chest pain.  Patient reports that she had some mild shortness of breath and then developed central chest discomfort shortly after eating pizza at approximately 9 PM.  She suspected this was indigestion but was not sure and took 4 baby aspirin and called EMS.  EMS administered 1 nitroglycerin.  Pain resolved and patient is unsure if it resolved due to the nitroglycerin or because she vomited.  The chest discomfort was localized to the central chest, different than her prior chest pain. ?  ?She underwent left heart cath on March 23, 2021 which demonstrated severe multivessel disease and occluded RCA stent.  Cardiothoracic surgery saw the patient in consultation at that time and she was felt to be a poor candidate for surgery. ?  ?ED Course: Upon arrival to the ED, patient is found to be afebrile with normal heart rate and mild hypertension.  EKG features sinus rhythm with PVC, nonspecific IVCD, and diffuse T wave inversions similar to prior.  Chest x-ray notable for interval development of patchy interstitial infiltrate.  Chemistry panel features a glucose of 344 and creatinine of 3.33.  CBC notable for leukocytosis and microcytic anemia.  Troponin was 181 and BNP 1180.  Patient was started on IV heparin in the ED and given 40 mg IV Lasix. ? ?Assessment and Plan: ?* Chest pain ?-Currently chest pain-free. ?-Continue heparin drip to complete 48 hours infusion ?-Continue to follow cardiology service  recommendation ?-Continue medical management. ?-Patient heart score of 5 presented with NSTEMI. ? ?Obesity ?- Class I obesity ?-Low calorie diet and portion control discussed with patient. ?-Body mass index is 34.73 kg/m?. ? ? ?CKD (chronic kidney disease), stage IV ([place]) ?-Overall stable and at baseline ?-Continue to follow renal function trend ?-Avoid the use of nephrotoxic agents, contrast and hypotension. ?-Maintain adequate hydration. ? ?Prolonged QT interval ?- Continue avoiding medications that can prolong QT ?-Continue telemetry monitoring. ? ?Coronary artery disease ?- As mentioned above patient presenting with NSTEMI ?-Multiple vessels disease without the chance of revascularization surgery. ?-Plan is to complete 48 hours of heparin drip ?-Continue optimizing cardiac medications. ?-Cardiology disease on board and will follow recommendation. ? ?Acute on chronic combined systolic and diastolic CHF (congestive heart failure) ([place]) ?- Continue to follow daily weights and strict I's and O's ?-Low-sodium diet has been instructed ?-Continue to follow cardiology service recommendations and medication management. ? ?Uncontrolled type 2 diabetes mellitus with hyperglycemia ([place]) ?- Continue the use of Jardiance ?-Continue holding the rest of oral hypoglycemic agents while inpatient ?-Continue sliding scale insulin ?-Follow CBGs trend. ? ?Mixed hyperlipidemia ?- Continue statin. ? ?Hypothyroidism ?- Continue Synthroid. ? ? ? ?Subjective:  ?Afebrile, no chest pain, no nausea, no vomiting, no palpitations.  No overnight events. ? ?Physical Exam: ?Vitals:  ? 04/22/21 0407 04/22/21 0756 04/22/21 1328 04/22/21 1647  ?BP: 102/61 139/88 (!) 108/56 125/75  ?Pulse: 64 63 67 70  ?Resp: 18  16   ?Temp:   97.8 ?F (36.6 ?C)   ?TempSrc:   Oral   ?  SpO2: 94% 95% 95% 99%  ?Weight:      ?Height:      ? ?General exam: Alert, awake, oriented x 3; no chest pain or palpitations.  Patient is afebrile and reports no nausea or  vomiting. ?Respiratory system: Clear to auscultation. Respiratory effort normal.  Good saturation on room air. ?Cardiovascular system: Rate controlled, no rubs, no gallops, no JVD. ?Gastrointestinal system: Abdomen is nondistended, soft and nontender. No organomegaly or masses felt. Normal bowel sounds heard. ?Central nervous system: Alert and oriented. No focal neurological deficits. ?Extremities: No cyanosis or clubbing. ?Skin: No petechiae. ?Psychiatry: Judgement and insight appear normal. Mood & affect appropriate.  ? ? ?Data Reviewed: ?-CBGs in the 120s to 330 range ?-Heparin level 0.6-0.7 range; pharmacy adjusting dose. ?-Potassium 3.2 creatinine 3.27 and normal anion gap. ?-Stable hemoglobin at 9.4; WBCs within normal limits.  No signs of overt bleeding.  Normal platelets count. ? ?Family Communication: No family at bedside. ? ?Disposition: ?Status is: Inpatient ?Remains inpatient appropriate because: Continue treatment for NSTEMI ? ? ? ? Planned Discharge Destination: Home ? ?Author: ?Barton Dubois, MD ?04/22/2021 6:22 PM ? ?For on call review [email or web address].  ? ?

## 2021-04-22 NOTE — Assessment & Plan Note (Addendum)
-  Continue statin and Zetia. ?-Advised to follow heart healthy diet. ?

## 2021-04-22 NOTE — Assessment & Plan Note (Addendum)
-  Currently chest pain-free. ?-Patient has completed 48 hours of conservative management with heparin drip infusion. ?-Appreciate assistance and recommendations by cardiology service. ?-Medication has been optimized at time of discharge to further control her symptoms (Imdur 50 mg daily has been added). ?-Unable to use ARB/ACE inhibitor/ARNI/Aldactone given severe renal dysfunction. ?-Will continue therapy with beta-blocker, aspirin, Lipitor and discontinue suggested use of Jardiance given his stage IV renal failure with GFR less than 30%.   ?-Continue risk factors modifications. ?-Outpatient follow-up with cardiology service will be arranged. ?

## 2021-04-22 NOTE — Progress Notes (Signed)
ANTICOAGULATION CONSULT NOTE - Follow Up Consult ? ?Pharmacy Consult for Heparin ?Indication: chest pain/ACS ? ?Allergies  ?Allergen Reactions  ? Cedar   ?  Swelling ?  ? Other   ?  Teriyaki- swelling  ? ? ?Patient Measurements: ?Height: 5\' 1"  (154.9 cm) ?Weight: 83.4 kg (183 lb 12.8 oz) ?IBW/kg (Calculated) : 47.8 ?Heparin Dosing Weight: 67.1 ? ? ?Labs: ?Recent Labs  ?  04/21/21 ?0410 04/21/21 ?0616 04/21/21 ?1351 04/21/21 ?2207 04/22/21 ?4035 04/22/21 ?1235  ?HGB 10.4*  --   --   --  9.4*  --   ?HCT 33.3*  --   --   --  30.1*  --   ?PLT 272  --   --   --  260  --   ?HEPARINUNFRC  --   --    < > 0.15* 0.64 0.76*  ?CREATININE 3.33*  --   --   --  3.27*  --   ?TROPONINIHS 181* 845*  --   --  867*  --   ? < > = values in this interval not displayed.  ? ? ? ?Estimated Creatinine Clearance: 18.8 mL/min (A) (by C-G formula based on SCr of 3.27 mg/dL (H)). ? ?Assessment: ?56 y/o F with known CAD/CP to continue medical therapy as patient not a candidate for CABG. Follow up heparin level still below goal at 0.15. No adverse events noted.  ? ?Heparin 0.76, supratherapeutic, no bleeding noted  ? ?Goal of Therapy:  ?Heparin level 0.3-0.7 units/ml ?Monitor platelets by anticoagulation protocol: Yes ?  ?Plan:  ?Reduce heparin rate to 1250 units/hr ?Recheck heparin level in 6 hours ? ?Donna Christen Vyolet Sakuma, PharmD, MBA ?Clinical Pharmacist ? ?04/22/2021 1:38 PM ? ?

## 2021-04-23 ENCOUNTER — Telehealth: Payer: Self-pay | Admitting: Student

## 2021-04-23 DIAGNOSIS — I5023 Acute on chronic systolic (congestive) heart failure: Secondary | ICD-10-CM

## 2021-04-23 DIAGNOSIS — I2510 Atherosclerotic heart disease of native coronary artery without angina pectoris: Secondary | ICD-10-CM

## 2021-04-23 DIAGNOSIS — I5043 Acute on chronic combined systolic (congestive) and diastolic (congestive) heart failure: Secondary | ICD-10-CM

## 2021-04-23 LAB — BASIC METABOLIC PANEL
Anion gap: 9 (ref 5–15)
BUN: 47 mg/dL — ABNORMAL HIGH (ref 6–20)
CO2: 16 mmol/L — ABNORMAL LOW (ref 22–32)
Calcium: 8.4 mg/dL — ABNORMAL LOW (ref 8.9–10.3)
Chloride: 109 mmol/L (ref 98–111)
Creatinine, Ser: 3.55 mg/dL — ABNORMAL HIGH (ref 0.44–1.00)
GFR, Estimated: 14 mL/min — ABNORMAL LOW (ref >60)
Glucose, Bld: 169 mg/dL — ABNORMAL HIGH (ref 70–99)
Potassium: 3.6 mmol/L (ref 3.5–5.1)
Sodium: 134 mmol/L — ABNORMAL LOW (ref 135–145)

## 2021-04-23 LAB — CBC
HCT: 30.3 % — ABNORMAL LOW (ref 36.0–46.0)
Hemoglobin: 9.1 g/dL — ABNORMAL LOW (ref 12.0–15.0)
MCH: 31 pg (ref 26.0–34.0)
MCHC: 30 g/dL (ref 30.0–36.0)
MCV: 103.1 fL — ABNORMAL HIGH (ref 80.0–100.0)
Platelets: 239 10*3/uL (ref 150–400)
RBC: 2.94 MIL/uL — ABNORMAL LOW (ref 3.87–5.11)
RDW: 14.1 % (ref 11.5–15.5)
WBC: 10.6 10*3/uL — ABNORMAL HIGH (ref 4.0–10.5)
nRBC: 0 % (ref 0.0–0.2)

## 2021-04-23 LAB — GLUCOSE, CAPILLARY
Glucose-Capillary: 150 mg/dL — ABNORMAL HIGH (ref 70–99)
Glucose-Capillary: 144 mg/dL — ABNORMAL HIGH (ref 70–99)
Glucose-Capillary: 280 mg/dL — ABNORMAL HIGH (ref 70–99)

## 2021-04-23 LAB — HEPARIN LEVEL (UNFRACTIONATED): Heparin Unfractionated: 0.25 IU/mL — ABNORMAL LOW (ref 0.30–0.70)

## 2021-04-23 MED ORDER — FUROSEMIDE 40 MG PO TABS: 40.0000 mg | ORAL_TABLET | Freq: Two times a day (BID) | ORAL | 2 refills | Status: DC

## 2021-04-23 MED ORDER — PEN NEEDLES 32G X 5 MM MISC: 2 refills | Status: AC

## 2021-04-23 MED ORDER — POTASSIUM CHLORIDE CRYS ER 20 MEQ PO TBCR: 20.0000 meq | EXTENDED_RELEASE_TABLET | Freq: Every day | ORAL | Status: DC

## 2021-04-23 MED ORDER — FUROSEMIDE 40 MG PO TABS: 40.0000 mg | ORAL_TABLET | Freq: Two times a day (BID) | ORAL | Status: DC

## 2021-04-23 MED ORDER — INSULIN DETEMIR 100 UNIT/ML FLEXPEN: 15.0000 [IU] | PEN_INJECTOR | Freq: Every day | SUBCUTANEOUS | 11 refills | Status: AC

## 2021-04-23 MED ORDER — HEPARIN SODIUM (PORCINE) 5000 UNIT/ML IJ SOLN
5000.0000 [IU] | Freq: Three times a day (TID) | INTRAMUSCULAR | Status: DC
  Administered 2021-04-23: 5000 [IU] via SUBCUTANEOUS
  Filled 2021-04-23: qty 1

## 2021-04-23 MED ORDER — ISOSORBIDE MONONITRATE ER 30 MG PO TB24: 15.0000 mg | ORAL_TABLET | Freq: Every day | ORAL | 2 refills | Status: DC

## 2021-04-23 NOTE — Progress Notes (Signed)
Progress Note  Patient Name: Monica Carlson Date of Encounter: 04/23/2021  [facility] Cardiologist: Radford Pax  Subjective   No complaints  Inpatient Medications    Scheduled Meds:  amLODipine  5 mg Oral Daily   aspirin EC  81 mg Oral Daily   atorvastatin  80 mg Oral QHS   carvedilol  6.25 mg Oral BID WC   clopidogrel  75 mg Oral Daily   empagliflozin  10 mg Oral Daily   furosemide  40 mg Intravenous Q12H   gabapentin  300 mg Oral BID   insulin aspart  0-5 Units Subcutaneous QHS   insulin aspart  0-9 Units Subcutaneous TID WC   isosorbide mononitrate  15 mg Oral Daily   Continuous Infusions:  PRN Meds: acetaminophen, nitroGLYCERIN, traZODone   Vital Signs    Vitals:   04/22/21 1647 04/22/21 2105 04/23/21 0419 04/23/21 0500  BP: 125/75 135/82 (!) 97/56   Pulse: 70 69 61   Resp:   18   Temp:  98.3 F (36.8 C)    TempSrc:  Oral    SpO2: 99% 96% 93%   Weight:    84 kg  Height:        Intake/Output Summary (Last 24 hours) at 04/23/2021 0833 Last data filed at 04/23/2021 0300 Gross per 24 hour  Intake 1330.53 ml  Output 3200 ml  Net -1869.47 ml   Last 3 Weights 04/23/2021 04/21/2021 04/21/2021  Weight (lbs) 185 lb 3 oz 183 lb 12.8 oz 186 lb  Weight (kg) 84 kg 83.371 kg 84.369 kg      Telemetry    SR - Personally Reviewed  ECG    N/a - Personally Reviewed  Physical Exam   GEN: No acute distress.   Neck: No JVD Cardiac: RRR, no murmurs, rubs, or gallops.  Respiratory: Clear to auscultation bilaterally. GI: Soft, nontender, non-distended  MS: No edema; No deformity. Neuro:  Nonfocal  Psych: Normal affect   Labs    High Sensitivity Troponin:   Recent Labs  Lab 04/21/21 0410 04/21/21 0616 04/22/21 0549  TROPONINIHS 181* 845* 867*     Chemistry Recent Labs  Lab 04/21/21 0410 04/21/21 0616 04/22/21 0549 04/23/21 0154  NA 135  --  137 134*  K 3.9  --  3.2* 3.6  CL 106  --  109 109  CO2 19*  --  19* 16*  GLUCOSE 344*  --  163* 169*  BUN 40*   --  38* 47*  CREATININE 3.33*  --  3.27* 3.55*  CALCIUM 8.8*  --  8.6* 8.4*  MG  --  2.2  --   --   PROT 7.0  --   --   --   ALBUMIN 3.6  --   --   --   AST 11*  --   --   --   ALT 9  --   --   --   ALKPHOS 78  --   --   --   BILITOT 0.4  --   --   --   GFRNONAA 16*  --  16* 14*  ANIONGAP 10  --  9 9    Lipids No results for input(s): CHOL, TRIG, HDL, LABVLDL, LDLCALC, CHOLHDL in the last 168 hours.  Hematology Recent Labs  Lab 04/21/21 0410 04/22/21 0549 04/23/21 0154  WBC 13.0* 10.0 10.6*  RBC 3.24* 2.93* 2.94*  HGB 10.4* 9.4* 9.1*  HCT 33.3* 30.1* 30.3*  MCV 102.8* 102.7* 103.1*  MCH 32.1 32.1 31.0  MCHC 31.2 31.2 30.0  RDW 14.3 14.4 14.1  PLT 272 260 239   Thyroid No results for input(s): TSH, FREET4 in the last 168 hours.  BNP Recent Labs  Lab 04/21/21 0410  BNP 1,180.0*    DDimer No results for input(s): DDIMER in the last 168 hours.   Radiology    No results found.  Cardiac Studies   Echo: 03/20/21    IMPRESSIONS     1. Left ventricular ejection fraction, by estimation, is 40 to 45%. The  left ventricle has mildly decreased function. The left ventricle  demonstrates global hypokinesis. Left ventricular diastolic parameters are  consistent with Grade I diastolic  dysfunction (impaired relaxation). Elevated left atrial pressure.   2. Right ventricular systolic function is normal. The right ventricular  size is normal. Tricuspid regurgitation signal is inadequate for assessing  PA pressure.   3. The mitral valve is abnormal. Mild mitral valve regurgitation. No  evidence of mitral stenosis.   4. The aortic valve is tricuspid. There is moderate calcification of the  aortic valve. There is moderate thickening of the aortic valve. Aortic  valve regurgitation is not visualized. No aortic stenosis is present.   FINDINGS   Left Ventricle: Left ventricular ejection fraction, by estimation, is 40  to 45%. The left ventricle has mildly decreased function.  The left  ventricle demonstrates global hypokinesis. The left ventricular internal  cavity size was normal in size. There is   no left ventricular hypertrophy. Left ventricular diastolic parameters  are consistent with Grade I diastolic dysfunction (impaired relaxation).  Elevated left atrial pressure.   Right Ventricle: The right ventricular size is normal. Right vetricular  wall thickness was not well visualized. Right ventricular systolic  function is normal. Tricuspid regurgitation signal is inadequate for  assessing PA pressure.   Left Atrium: Left atrial size was normal in size.   Right Atrium: Right atrial size was normal in size.   Pericardium: There is no evidence of pericardial effusion.   Mitral Valve: The mitral valve is abnormal. There is mild thickening of  the mitral valve leaflet(s). There is mild calcification of the mitral  valve leaflet(s). Mild mitral annular calcification. Mild mitral valve  regurgitation. No evidence of mitral  valve stenosis.   Tricuspid Valve: The tricuspid valve is not well visualized. Tricuspid  valve regurgitation is not demonstrated. No evidence of tricuspid  stenosis.   Aortic Valve: The aortic valve is tricuspid. There is moderate  calcification of the aortic valve. There is moderate thickening of the  aortic valve. There is moderate aortic valve annular calcification. Aortic  valve regurgitation is not visualized. No  aortic stenosis is present. Aortic valve mean gradient measures 5.2 mmHg.  Aortic valve peak gradient measures 9.7 mmHg. Aortic valve area, by VTI  measures 1.53 cm.   Pulmonic Valve: The pulmonic valve was not well visualized. Pulmonic valve  regurgitation is not visualized. No evidence of pulmonic stenosis.   Aorta: The aortic root is normal in size and structure.   IAS/Shunts: No atrial level shunt detected by color flow Doppler.      Cardiac cath: 03/23/2021 Conclusion       Mid RCA lesion is 100%  stenosed.  Left to right collaterals.   2nd Mrg lesion is 90% stenosed.   Ost LAD to Mid LAD lesion is 75% stenosed.   1st Diag lesion is 75% stenosed.   LV end diastolic pressure is mildly elevated.   There  is no aortic valve stenosis.   Severe, multivessel disease.  Diffuse disease throughout the left system.  Occluded mid RCA stent.  Large right PDA and distal RCA system which fills by collaterals from the left.  Restart heparin in 8 hours.  Discussed with Dr. [place].  Will obtain CVTS consult.  Contrast minimize due to renal dysfunction. Diagnostic Dominance: Right Intervention   Laboratory Data:   High Sensitivity Troponin:   Last Labs       Recent Labs  Lab 04/21/21 0410 04/21/21 0616  TROPONINIHS 181* 845*       Chemistry Last Labs       Recent Labs  Lab 04/21/21 0410 04/21/21 0616  NA 135  --   K 3.9  --   CL 106  --   CO2 19*  --   GLUCOSE 344*  --   BUN 40*  --   CREATININE 3.33*  --   CALCIUM 8.8*  --   MG  --  2.2  GFRNONAA 16*  --   ANIONGAP 10  --       Last Labs      Recent Labs  Lab 04/21/21 0410  PROT 7.0  ALBUMIN 3.6  AST 11*  ALT 9  ALKPHOS 78  BILITOT 0.4      Lipids  Last Labs   No results for input(s): CHOL, TRIG, HDL, LABVLDL, LDLCALC, CHOLHDL in the last 168 hours.    Hematology Last Labs      Recent Labs  Lab 04/21/21 0410  WBC 13.0*  RBC 3.24*  HGB 10.4*  HCT 33.3*  MCV 102.8*  MCH 32.1  MCHC 31.2  RDW 14.3  PLT 272      Thyroid  Last Labs   No results for input(s): TSH, FREET4 in the last 168 hours.    BNP Last Labs      Recent Labs  Lab 04/21/21 0410  BNP 1,180.0*      DDimer  Last Labs   No results for input(s): DDIMER in the last 168 hours.       Radiology/Studies:  DG Chest Portable 1 View   Result Date: 04/21/2021 CLINICAL DATA:  Chest pain, vomiting EXAM: PORTABLE CHEST 1 VIEW COMPARISON:  03/19/2021 FINDINGS: Lung volumes are small, but are symmetric and pulmonary insufflation is  stable since prior examination. There has developed mild patchy interstitial pulmonary infiltrate, favored to be inflammatory or infectious in etiology. No pneumothorax. Small right pleural effusion. Mild cardiomegaly is stable. IMPRESSION: Interval development of patchy interstitial pulmonary infiltrate, favored to be inflammatory or infectious. Electronically Signed   By: Fidela Salisbury M.D.   On: 04/21/2021 03:51  Patient Profile     COREN CROWNOVER is a 56 y.o. female with a hx of CAD prior RCA stent, cardiomyopathy chronic combined systolic and diastolic CHF, CKD-4 HTN, HLD DM CVAs and recent NSTEMI with significant CAD that would require CABG but with TCTS consult not felt to be a candidate due to renal disease, deconditioning, poor targets for grafting, recent COVID plans to treat medically who is being seen 04/21/2021 for the evaluation of chest pain after eating pizza but relieved with 1 sl NTG , + vomiting at the request of Dr. Dyann Kief.  Assessment & Plan    1.CAD/NSTEMI - Admit Jan 2023 with NSTEMI. Cath Jan 2023 showed ostial LAD 75%, D1 75%, OM2 90%, RCA mid occlusion RPDA fills with collateral. Surgical anatomy. Seen by CT surgery who stated patient not surgical candidate due  to her significant comorbid renal disease, deconditioning, poor targets for grafting, and recent COVID pneumonitis. Was to f/u with CT surgery outpatient  - presented with recurrent NSTEMI. Trop up to 845 unclear peak, chronic EKG changes - discussed with interventional cardiology Dr Scarlette Calico. In general she has PCI targets. WOuld have to way risk vs benefit particularly given renal dysfnction, and likely all things considered if pain controlled best option likely just medical therapy.   - medical therapy with ASA 81, atorva 80, coreg 6.25mg  bid, imdur 15mg  daily, has completed 48 hours of heparin can d/c - no recurrent chest pain episode she had at admission.   2. Acute on chronic systolic HF - prior echo LVE  40-45% - she is negative 2.4 L yesterday, neg 1.8 since admission. She is on IV lasix 40mg  bid. Uptrendin Cr/BUN, d/c IV lasix. Would start oral lasix 40mg  bid tomorrow - medical therapy with coreg 6.25mg  bid, jardiance 10mg . No ACE/ARB/ARNI/aldactone given severe renal dysfunction. Monitor GFR as far as continuing jardiance over time, borderline GFR right around cut off of 20 for use.   3. CKD IV  Ok for discharge, keep f/u with Dr Radford Pax. We will sign off inpatient care.    For questions or updates, please contact [place] Please consult [email or web address] for contact info under        Signed, Carlyle Dolly, MD  04/23/2021, 8:33 AM

## 2021-04-23 NOTE — Discharge Summary (Signed)
Physician Discharge Summary   Patient: Monica Carlson MRN: [id number] DOB: 19-Jun-1965  Admit date:     04/21/2021  Discharge date: 04/23/21  Discharge Physician: Barton Dubois   PCP: [place], [place]   Recommendations at discharge:  Repeat basic metabolic panel to follow electrolytes and renal function Continue close monitoring of patient's CBGs with further adjustment to hypoglycemia regimen as needed Make sure patient has follow-up with cardiology service as instructed Reassess blood pressure/volume status with further adjustment to medications as required.  Discharge Diagnoses: Principal Problem:   Chest pain Active Problems:   Obesity   Hypothyroidism   Mixed hyperlipidemia   Uncontrolled type 2 diabetes mellitus with hyperglycemia (HCC)   Acute on chronic combined systolic and diastolic CHF (congestive heart failure) (HCC)   Coronary artery disease   Prolonged QT interval   CKD (chronic kidney disease), stage IV [facility])  Brief hospital admission narrative  course: As per H&P written by Dr. Myna Hidalgo on 04/20/2021 Monica Carlson is a pleasant 56 y.o. female with medical history significant for coronary artery disease, chronic combined systolic and diastolic CHF, hypertension, type 2 diabetes mellitus, hypothyroidism, and CKD 4, who presented to the emergency department after an episode of chest pain.  Patient reports that she had some mild shortness of breath and then developed central chest discomfort shortly after eating pizza at approximately 9 PM.  She suspected this was indigestion but was not sure and took 4 baby aspirin and called EMS.  EMS administered 1 nitroglycerin.  Pain resolved and patient is unsure if it resolved due to the nitroglycerin or because she vomited.  The chest discomfort was localized to the central chest, different than her prior chest pain.   She underwent left heart cath on March 23, 2021 which demonstrated severe multivessel disease and  occluded RCA stent.  Cardiothoracic surgery saw the patient in consultation at that time and she was felt to be a poor candidate for surgery.   ED Course: Upon arrival to the ED, patient is found to be afebrile with normal heart rate and mild hypertension.  EKG features sinus rhythm with PVC, nonspecific IVCD, and diffuse T wave inversions similar to prior.  Chest x-ray notable for interval development of patchy interstitial infiltrate.  Chemistry panel features a glucose of 344 and creatinine of 3.33.  CBC notable for leukocytosis and microcytic anemia.  Troponin was 181 and BNP 1180.  Patient was started on IV heparin in the ED and given 40 mg IV Lasix.    Assessment and Plan: * Chest pain -Currently chest pain-free. -Patient has completed 48 hours of conservative management with heparin drip infusion. -Appreciate assistance and recommendations by cardiology service. -Medication has been optimized at time of discharge to further control her symptoms (Imdur 50 mg daily has been added). -Unable to use ARB/ACE inhibitor/ARNI/Aldactone given severe renal dysfunction. -Will continue therapy with beta-blocker, aspirin, Lipitor and discontinue suggested use of Jardiance given his stage IV renal failure with GFR less than 30%.   -Continue risk factors modifications. -Outpatient follow-up with cardiology service will be arranged.  Obesity -Class I obesity -Low calorie diet and portion control discussed with patient. -Body mass index is 34.73 kg/m.   CKD (chronic kidney disease), stage IV (HCC) -Overall stable and at baseline for her. -Continue to follow renal function trend intermittently. -Continue to avoid the use of nephrotoxic agents, contrast and hypotension. -Maintain adequate hydration.  Prolonged QT interval -Continue avoiding medications that can prolong QT -Continue intermittent monitoring of  EKG to follow QT stability..  Coronary artery disease - As mentioned above patient  presented with NSTEMI -Multiple vessels disease without the chance of revascularization surgery. -Patient completed 48 hours of heparin drip infusion and will follow optimize medication regimen as recommended by cardiology service.   -At discharge no chest pain or shortness of breath.   Acute on chronic combined systolic and diastolic CHF (congestive heart failure) ([place]) -Continue to follow daily weights and low-sodium diet. -Discharged on adjusted dose of Lasix 40 mg twice a day. -Continue to follow cardiology service recommendations and medication management; outpatient appointment will be arranged for her.  Uncontrolled type 2 diabetes mellitus with hyperglycemia (HCC) -Will discontinue the use of Jardiance due to stage IV renal failure with GFR less than 30%. -Continue Januvia daily and start treatment with Levemir -Close monitoring of patient's CBGs with further adjustment to hypoglycemia regimen as required. -A1c 8.2 suggesting uncontrolled diabetes. -Patient with nephropathy/kidney complications.   Mixed hyperlipidemia -Continue statin and Zetia. -Advised to follow heart healthy diet.  Hypothyroidism -Continue Synthroid. -Repeat thyroid panel follow-up visit -Patient has been educated about taking Synthroid on empty stomach.   Consultants: Cardiology service Procedures performed: See below for x-ray reports. Disposition: Discharged home with instruction to follow-up with PCP and cardiology service.  Diet recommendation: Heart healthy modified carbohydrate diet.  Patient also instructed to follow low calorie diet to further improve weight management.  DISCHARGE MEDICATION: Allergies as of 04/23/2021       Reactions   Cedar    Swelling   Other    Teriyaki- swelling        Medication List     STOP taking these medications    chlorthalidone 25 MG tablet Commonly known as: HYGROTON   diclofenac Sodium 1 % Gel Commonly known as: VOLTAREN   glipiZIDE 10 MG  tablet Commonly known as: GLUCOTROL   meclizine 25 MG tablet Commonly known as: ANTIVERT       TAKE these medications    Accu-Chek Guide Me w/Device Kit 1 Piece by Does not apply route as directed.   acetaminophen 500 MG tablet Commonly known as: TYLENOL Take 1,000-1,500 mg by mouth every 6 (six) hours as needed for mild pain.   albuterol 108 (90 Base) MCG/ACT inhaler Commonly known as: VENTOLIN HFA Inhale 2 puffs into the lungs every 4 (four) hours as needed for shortness of breath.   amLODipine 5 MG tablet Commonly known as: NORVASC Take 5 mg by mouth daily.   aspirin 81 MG EC tablet Take 1 tablet (81 mg total) by mouth daily. Swallow whole.   atorvastatin 80 MG tablet Commonly known as: LIPITOR Take 1 tablet (80 mg total) by mouth at bedtime.   carvedilol 6.25 MG tablet Commonly known as: COREG Take 6.25 mg by mouth 2 (two) times daily.   clopidogrel 75 MG tablet Commonly known as: PLAVIX TAKE ONE TABLET BY MOUTH EVERY DAY WITH BREAKFAST What changed: See the new instructions.   ezetimibe 10 MG tablet Commonly known as: ZETIA Take 1 tablet (10 mg total) by mouth daily.   furosemide 40 MG tablet Commonly known as: LASIX Take 1 tablet (40 mg total) by mouth 2 (two) times daily. What changed:  medication strength how much to take   gabapentin 300 MG capsule Commonly known as: NEURONTIN Take 300 mg by mouth 2 (two) times daily.   glucose blood test strip Commonly known as: Accu-Chek Guide Use as instructed 4 x daily. e11.65   glucose blood test strip  1 each by Other route 2 (two) times daily. Use as instructed bid. E11.65   icosapent Ethyl 1 g capsule Commonly known as: Vascepa Take 2 capsules (2 g total) by mouth 2 (two) times daily. With food   insulin detemir 100 UNIT/ML FlexPen Commonly known as: LEVEMIR Inject 15 Units into the skin at bedtime.   isosorbide mononitrate 30 MG 24 hr tablet Commonly known as: IMDUR Take 0.5 tablets (15 mg  total) by mouth daily. Start taking on: April 24, 2021   Januvia 50 MG tablet Generic drug: sitaGLIPtin TAKE 1 TABLET BY MOUTH EVERY DAY What changed: how much to take   Lancets Thin Misc 1 each by Does not apply route 4 (four) times daily. E11.65 Pt has Prodigy meter   levothyroxine 125 MCG tablet Commonly known as: SYNTHROID TAKE 1 TABLET BY MOUTH DAILY BEFORE BREAKFAST   nitroGLYCERIN 0.4 MG SL tablet Commonly known as: NITROSTAT Place 1 tablet (0.4 mg total) under the tongue every 5 (five) minutes as needed for chest pain.   Pen Needles 32G X 5 MM Misc Use 1 needle daily to inject insulin as instructed.   potassium chloride SA 20 MEQ tablet Commonly known as: KLOR-CON M Take 20 mEq by mouth 2 (two) times daily.   sodium bicarbonate 650 MG tablet Take 1 tablet (650 mg total) by mouth 2 (two) times daily.   traZODone 50 MG tablet Commonly known as: DESYREL Take 50 mg by mouth at bedtime as needed.   vitamin C 500 MG tablet Commonly known as: ASCORBIC ACID Take 500 mg by mouth daily.   Vitamin D 50 MCG (2000 UT) Caps Take 2,000 Units by mouth daily.   Zinc 50 MG Tabs Take 50 mg by mouth daily.        [place], [facility]. Schedule an appointment as soon as possible for a visit in 10 day(s).   Contact information: [place] [phone]                 Discharge Exam: Danley Danker Weights   04/21/21 0249 04/21/21 1811 04/23/21 0500  Weight: 84.4 kg 83.4 kg 84 kg   General exam: Alert, awake, oriented x 3; no chest pain or palpitations.  Patient is afebrile and reports no nausea or vomiting. Respiratory system: Clear to auscultation. Respiratory effort normal.  Good saturation on room air. Cardiovascular system: Rate controlled, no rubs, no gallops, no JVD. Gastrointestinal system: Abdomen is nondistended, soft and nontender. No organomegaly or masses felt. Normal bowel sounds  heard. Central nervous system: Alert and oriented. No focal neurological deficits. Extremities: No cyanosis or clubbing. Skin: No petechiae. Psychiatry: Judgement and insight appear normal. Mood & affect appropriate.     Condition at discharge: Stable and improved.  The results of significant diagnostics from this hospitalization (including imaging, microbiology, ancillary and laboratory) are listed below for reference.   Imaging Studies: DG Chest Portable 1 View  Result Date: 04/21/2021 CLINICAL DATA:  Chest pain, vomiting EXAM: PORTABLE CHEST 1 VIEW COMPARISON:  03/19/2021 FINDINGS: Lung volumes are small, but are symmetric and pulmonary insufflation is stable since prior examination. There has developed mild patchy interstitial pulmonary infiltrate, favored to be inflammatory or infectious in etiology. No pneumothorax. Small right pleural effusion. Mild cardiomegaly is stable. IMPRESSION: Interval development of patchy interstitial pulmonary infiltrate, favored to be inflammatory or infectious. Electronically Signed   By: Fidela Salisbury M.D.   On: 04/21/2021 03:51  Microbiology: Results for orders placed or performed during the hospital encounter of 04/21/21  Resp Panel by RT-PCR (Flu A&B, Covid) Nasopharyngeal Swab     Status: None   Collection Time: 04/21/21  5:54 AM   Specimen: Nasopharyngeal Swab; Nasopharyngeal(NP) swabs in vial transport medium  Result Value Ref Range Status   SARS Coronavirus 2 by RT PCR NEGATIVE NEGATIVE Final    Comment: (NOTE) SARS-CoV-2 target nucleic acids are NOT DETECTED.  The SARS-CoV-2 RNA is generally detectable in upper respiratory specimens during the acute phase of infection. The lowest concentration of SARS-CoV-2 viral copies this assay can detect is 138 copies/mL. A negative result does not preclude SARS-Cov-2 infection and should not be used as the sole basis for treatment or other patient management decisions. A negative result may occur  with  improper specimen collection/handling, submission of specimen other than nasopharyngeal swab, presence of viral mutation(s) within the areas targeted by this assay, and inadequate number of viral copies(<138 copies/mL). A negative result must be combined with clinical observations, patient history, and epidemiological information. The expected result is Negative.  Fact Sheet for Patients:  EntrepreneurPulse.com.au  Fact Sheet for Healthcare Providers:  IncredibleEmployment.be  This test is no t yet approved or cleared by the [place] FDA and  has been authorized for detection and/or diagnosis of SARS-CoV-2 by FDA under an Emergency Use Authorization (EUA). This EUA will remain  in effect (meaning this test can be used) for the duration of the COVID-19 declaration under Section 564(b)(1) of the Act, 21 U.S.C.section 360bbb-3(b)(1), unless the authorization is terminated  or revoked sooner.       Influenza A by PCR NEGATIVE NEGATIVE Final   Influenza B by PCR NEGATIVE NEGATIVE Final    Comment: (NOTE) The Xpert Xpress SARS-CoV-2/FLU/RSV plus assay is intended as an aid in the diagnosis of influenza from Nasopharyngeal swab specimens and should not be used as a sole basis for treatment. Nasal washings and aspirates are unacceptable for Xpert Xpress SARS-CoV-2/FLU/RSV testing.  Fact Sheet for Patients: EntrepreneurPulse.com.au  Fact Sheet for Healthcare Providers: IncredibleEmployment.be  This test is not yet approved or cleared by the [place] FDA and has been authorized for detection and/or diagnosis of SARS-CoV-2 by FDA under an Emergency Use Authorization (EUA). This EUA will remain in effect (meaning this test can be used) for the duration of the COVID-19 declaration under Section 564(b)(1) of the Act, 21 U.S.C. section 360bbb-3(b)(1), unless the authorization is terminated  or revoked.  Performed at [facility], [address], [place], [place] [address]     Labs: CBC: Recent Labs  Lab 04/21/21 0410 04/22/21 0549 04/23/21 0154  WBC 13.0* 10.0 10.6*  NEUTROABS 11.0*  --   --   HGB 10.4* 9.4* 9.1*  HCT 33.3* 30.1* 30.3*  MCV 102.8* 102.7* 103.1*  PLT 272 260 175   Basic Metabolic Panel: Recent Labs  Lab 04/21/21 0410 04/21/21 0616 04/22/21 0549 04/23/21 0154  NA 135  --  137 134*  K 3.9  --  3.2* 3.6  CL 106  --  109 109  CO2 19*  --  19* 16*  GLUCOSE 344*  --  163* 169*  BUN 40*  --  38* 47*  CREATININE 3.33*  --  3.27* 3.55*  CALCIUM 8.8*  --  8.6* 8.4*  MG  --  2.2  --   --    Liver Function Tests: Recent Labs  Lab 04/21/21 0410  AST 11*  ALT 9  ALKPHOS 78  BILITOT 0.4  PROT 7.0  ALBUMIN 3.6   CBG: Recent Labs  Lab 04/22/21 2103 04/22/21 2356 04/23/21 0416 04/23/21 0721 04/23/21 1104  GLUCAP 258* 193* 150* 144* 280*    Discharge time spent: greater than 30 minutes.  Signed: Barton Dubois, MD [facility] 04/23/2021

## 2021-04-23 NOTE — Telephone Encounter (Signed)
[facility] would like patient's most current medication list.  It can be faxed to them at [phone] ?

## 2021-04-23 NOTE — Progress Notes (Addendum)
ANTICOAGULATION CONSULT NOTE - Follow Up Consult ? ?Pharmacy Consult for Heparin ?Indication: chest pain/ACS ? ?Allergies  ?Allergen Reactions  ? Cedar   ?  Swelling ?  ? Other   ?  Teriyaki- swelling  ? ? ?Patient Measurements: ?Height: 5\' 1"  (154.9 cm) ?Weight: 83.4 kg (183 lb 12.8 oz) ?IBW/kg (Calculated) : 47.8 ?Heparin Dosing Weight: 67.1 ? ? ?Labs: ?Recent Labs  ?  04/21/21 ?0410 04/21/21 ?0616 04/21/21 ?1351 04/22/21 ?6045 04/22/21 ?1235 04/22/21 ?1936 04/23/21 ?0154  ?HGB 10.4*  --   --  9.4*  --   --  9.1*  ?HCT 33.3*  --   --  30.1*  --   --  30.3*  ?PLT 272  --   --  260  --   --  239  ?HEPARINUNFRC  --   --    < > 0.64 0.76* 0.44 0.25*  ?CREATININE 3.33*  --   --  3.27*  --   --   --   ?TROPONINIHS 181* 845*  --  867*  --   --   --   ? < > = values in this interval not displayed.  ? ? ? ?Estimated Creatinine Clearance: 18.8 mL/min (A) (by C-G formula based on SCr of 3.27 mg/dL (H)). ? ?Assessment: ?57 y/o F with known CAD/CP to continue medical therapy as patient not a candidate for CABG.  ? ?Heparin level now below goal on heparin at 1250 units/hr. Will increase rate to achieve goal. No bleeding or IV issues noted.  ? ?Goal of Therapy:  ?Heparin level 0.3-0.7 units/ml ?Monitor platelets by anticoagulation protocol: Yes ?  ?Plan:  ?Increase heparin to 1400 units/hr - 48 hours will be completed later this am, stop date entered ? ?Erin Hearing PharmD., BCPS ?Clinical Pharmacist ?04/23/2021 2:29 AM ? ?

## 2021-05-02 ENCOUNTER — Emergency Department (HOSPITAL_COMMUNITY): Payer: Medicare Other

## 2021-05-02 ENCOUNTER — Encounter (HOSPITAL_COMMUNITY): Payer: Self-pay

## 2021-05-02 ENCOUNTER — Inpatient Hospital Stay (HOSPITAL_COMMUNITY)
Admission: EM | Admit: 2021-05-02 | Discharge: 2021-05-05 | DRG: 280 | Disposition: A | Discharge status: Home / Self Care | Payer: Medicare Other | Attending: Internal Medicine | Admitting: Internal Medicine

## 2021-05-02 ENCOUNTER — Other Ambulatory Visit: Payer: Self-pay

## 2021-05-02 DIAGNOSIS — E872 Acidosis, unspecified: Secondary | ICD-10-CM | POA: Diagnosis present

## 2021-05-02 DIAGNOSIS — E119 Type 2 diabetes mellitus without complications: Secondary | ICD-10-CM

## 2021-05-02 DIAGNOSIS — I10 Essential (primary) hypertension: Secondary | ICD-10-CM | POA: Diagnosis not present

## 2021-05-02 DIAGNOSIS — Z7902 Long term (current) use of antithrombotics/antiplatelets: Secondary | ICD-10-CM

## 2021-05-02 DIAGNOSIS — I5043 Acute on chronic combined systolic (congestive) and diastolic (congestive) heart failure: Secondary | ICD-10-CM | POA: Diagnosis present

## 2021-05-02 DIAGNOSIS — I5021 Acute systolic (congestive) heart failure: Secondary | ICD-10-CM

## 2021-05-02 DIAGNOSIS — I252 Old myocardial infarction: Secondary | ICD-10-CM

## 2021-05-02 DIAGNOSIS — R079 Chest pain, unspecified: Secondary | ICD-10-CM | POA: Diagnosis not present

## 2021-05-02 DIAGNOSIS — E039 Hypothyroidism, unspecified: Secondary | ICD-10-CM | POA: Diagnosis present

## 2021-05-02 DIAGNOSIS — I214 Non-ST elevation (NSTEMI) myocardial infarction: Secondary | ICD-10-CM | POA: Diagnosis not present

## 2021-05-02 DIAGNOSIS — I130 Hypertensive heart and chronic kidney disease with heart failure and stage 1 through stage 4 chronic kidney disease, or unspecified chronic kidney disease: Secondary | ICD-10-CM | POA: Diagnosis not present

## 2021-05-02 DIAGNOSIS — E871 Hypo-osmolality and hyponatremia: Secondary | ICD-10-CM | POA: Diagnosis present

## 2021-05-02 DIAGNOSIS — E1122 Type 2 diabetes mellitus with diabetic chronic kidney disease: Secondary | ICD-10-CM | POA: Diagnosis present

## 2021-05-02 DIAGNOSIS — Z833 Family history of diabetes mellitus: Secondary | ICD-10-CM

## 2021-05-02 DIAGNOSIS — Z8249 Family history of ischemic heart disease and other diseases of the circulatory system: Secondary | ICD-10-CM

## 2021-05-02 DIAGNOSIS — R0789 Other chest pain: Secondary | ICD-10-CM | POA: Diagnosis not present

## 2021-05-02 DIAGNOSIS — I429 Cardiomyopathy, unspecified: Secondary | ICD-10-CM | POA: Diagnosis present

## 2021-05-02 DIAGNOSIS — Z955 Presence of coronary angioplasty implant and graft: Secondary | ICD-10-CM

## 2021-05-02 DIAGNOSIS — Z801 Family history of malignant neoplasm of trachea, bronchus and lung: Secondary | ICD-10-CM

## 2021-05-02 DIAGNOSIS — Z8616 Personal history of COVID-19: Secondary | ICD-10-CM

## 2021-05-02 DIAGNOSIS — E7800 Pure hypercholesterolemia, unspecified: Secondary | ICD-10-CM

## 2021-05-02 DIAGNOSIS — Z8541 Personal history of malignant neoplasm of cervix uteri: Secondary | ICD-10-CM

## 2021-05-02 DIAGNOSIS — Z951 Presence of aortocoronary bypass graft: Secondary | ICD-10-CM

## 2021-05-02 DIAGNOSIS — Z9049 Acquired absence of other specified parts of digestive tract: Secondary | ICD-10-CM

## 2021-05-02 DIAGNOSIS — E1165 Type 2 diabetes mellitus with hyperglycemia: Secondary | ICD-10-CM | POA: Diagnosis present

## 2021-05-02 DIAGNOSIS — Z79899 Other long term (current) drug therapy: Secondary | ICD-10-CM

## 2021-05-02 DIAGNOSIS — D631 Anemia in chronic kidney disease: Secondary | ICD-10-CM | POA: Diagnosis present

## 2021-05-02 DIAGNOSIS — E876 Hypokalemia: Secondary | ICD-10-CM | POA: Diagnosis present

## 2021-05-02 DIAGNOSIS — E782 Mixed hyperlipidemia: Secondary | ICD-10-CM | POA: Diagnosis present

## 2021-05-02 DIAGNOSIS — Z8673 Personal history of transient ischemic attack (TIA), and cerebral infarction without residual deficits: Secondary | ICD-10-CM

## 2021-05-02 DIAGNOSIS — R778 Other specified abnormalities of plasma proteins: Principal | ICD-10-CM

## 2021-05-02 DIAGNOSIS — I2510 Atherosclerotic heart disease of native coronary artery without angina pectoris: Secondary | ICD-10-CM | POA: Diagnosis present

## 2021-05-02 DIAGNOSIS — E669 Obesity, unspecified: Secondary | ICD-10-CM | POA: Diagnosis present

## 2021-05-02 DIAGNOSIS — R7989 Other specified abnormal findings of blood chemistry: Secondary | ICD-10-CM

## 2021-05-02 DIAGNOSIS — Z6835 Body mass index (BMI) 35.0-35.9, adult: Secondary | ICD-10-CM

## 2021-05-02 DIAGNOSIS — N184 Chronic kidney disease, stage 4 (severe): Secondary | ICD-10-CM | POA: Diagnosis present

## 2021-05-02 LAB — BRAIN NATRIURETIC PEPTIDE: B Natriuretic Peptide: 1132 pg/mL — ABNORMAL HIGH (ref 0.0–100.0)

## 2021-05-02 LAB — BASIC METABOLIC PANEL
Anion gap: 11 (ref 5–15)
BUN: 35 mg/dL — ABNORMAL HIGH (ref 6–20)
CO2: 15 mmol/L — ABNORMAL LOW (ref 22–32)
Calcium: 8.8 mg/dL — ABNORMAL LOW (ref 8.9–10.3)
Chloride: 108 mmol/L (ref 98–111)
Creatinine, Ser: 3.21 mg/dL — ABNORMAL HIGH (ref 0.44–1.00)
GFR, Estimated: 16 mL/min — ABNORMAL LOW (ref >60)
Glucose, Bld: 441 mg/dL — ABNORMAL HIGH (ref 70–99)
Potassium: 3.7 mmol/L (ref 3.5–5.1)
Sodium: 134 mmol/L — ABNORMAL LOW (ref 135–145)

## 2021-05-02 LAB — CBC
HCT: 32.7 % — ABNORMAL LOW (ref 36.0–46.0)
Hemoglobin: 10.5 g/dL — ABNORMAL LOW (ref 12.0–15.0)
MCH: 32 pg (ref 26.0–34.0)
MCHC: 32.1 g/dL (ref 30.0–36.0)
MCV: 99.7 fL (ref 80.0–100.0)
Platelets: 318 10*3/uL (ref 150–400)
RBC: 3.28 MIL/uL — ABNORMAL LOW (ref 3.87–5.11)
RDW: 14.2 % (ref 11.5–15.5)
WBC: 12 10*3/uL — ABNORMAL HIGH (ref 4.0–10.5)
nRBC: 0 % (ref 0.0–0.2)

## 2021-05-02 LAB — HEPARIN LEVEL (UNFRACTIONATED): Heparin Unfractionated: 0.16 IU/mL — ABNORMAL LOW (ref 0.30–0.70)

## 2021-05-02 LAB — GLUCOSE, CAPILLARY
Glucose-Capillary: 208 mg/dL — ABNORMAL HIGH (ref 70–99)
Glucose-Capillary: 180 mg/dL — ABNORMAL HIGH (ref 70–99)

## 2021-05-02 LAB — RESP PANEL BY RT-PCR (FLU A&B, COVID) ARPGX2
Influenza A by PCR: NEGATIVE
Influenza B by PCR: NEGATIVE
SARS Coronavirus 2 by RT PCR: NEGATIVE

## 2021-05-02 LAB — CBG MONITORING, ED
Glucose-Capillary: 340 mg/dL — ABNORMAL HIGH (ref 70–99)
Glucose-Capillary: 134 mg/dL — ABNORMAL HIGH (ref 70–99)

## 2021-05-02 LAB — MAGNESIUM: Magnesium: 2.2 mg/dL (ref 1.7–2.4)

## 2021-05-02 LAB — I-STAT BETA HCG BLOOD, ED (MC, WL, AP ONLY): I-stat hCG, quantitative: <5 m[IU]/mL (ref <5)

## 2021-05-02 LAB — TROPONIN I (HIGH SENSITIVITY)
Troponin I (High Sensitivity): 55 ng/L — ABNORMAL HIGH (ref <18)
Troponin I (High Sensitivity): 147 ng/L (ref <18)
Troponin I (High Sensitivity): 529 ng/L (ref <18)

## 2021-05-02 MED ORDER — LEVOTHYROXINE SODIUM 25 MCG PO TABS
125.0000 ug | ORAL_TABLET | Freq: Every day | ORAL | Status: DC
  Administered 2021-05-02 – 2021-05-05 (×4): 125 ug via ORAL
  Filled 2021-05-02 (×4): qty 1

## 2021-05-02 MED ORDER — NITROGLYCERIN 0.4 MG SL SUBL: 0.4000 mg | SUBLINGUAL_TABLET | SUBLINGUAL | Status: DC | PRN

## 2021-05-02 MED ORDER — ASPIRIN 325 MG PO TABS: 325.0000 mg | ORAL_TABLET | Freq: Once | ORAL | Status: DC

## 2021-05-02 MED ORDER — INSULIN ASPART 100 UNIT/ML IJ SOLN
0.0000 [IU] | Freq: Three times a day (TID) | INTRAMUSCULAR | Status: DC
  Administered 2021-05-02: 3 [IU] via SUBCUTANEOUS
  Administered 2021-05-02: 7 [IU] via SUBCUTANEOUS
  Administered 2021-05-02: 1 [IU] via SUBCUTANEOUS
  Administered 2021-05-03 (×2): 2 [IU] via SUBCUTANEOUS
  Administered 2021-05-03: 5 [IU] via SUBCUTANEOUS
  Administered 2021-05-04 (×3): 2 [IU] via SUBCUTANEOUS
  Administered 2021-05-05: 1 [IU] via SUBCUTANEOUS
  Administered 2021-05-05: 7 [IU] via SUBCUTANEOUS

## 2021-05-02 MED ORDER — HEPARIN BOLUS VIA INFUSION
2000.0000 [IU] | Freq: Once | INTRAVENOUS | Status: AC
  Administered 2021-05-02: 2000 [IU] via INTRAVENOUS
  Filled 2021-05-02: qty 2000

## 2021-05-02 MED ORDER — EZETIMIBE 10 MG PO TABS
10.0000 mg | ORAL_TABLET | Freq: Every day | ORAL | Status: DC
  Administered 2021-05-02 – 2021-05-05 (×4): 10 mg via ORAL
  Filled 2021-05-02 (×4): qty 1

## 2021-05-02 MED ORDER — CARVEDILOL 6.25 MG PO TABS
6.2500 mg | ORAL_TABLET | Freq: Two times a day (BID) | ORAL | Status: DC
Start: 2021-05-02 — End: 2021-05-05
  Administered 2021-05-02 – 2021-05-05 (×7): 6.25 mg via ORAL
  Filled 2021-05-02 (×2): qty 1
  Filled 2021-05-02: qty 2
  Filled 2021-05-02 (×4): qty 1

## 2021-05-02 MED ORDER — ACETAMINOPHEN 325 MG PO TABS
650.0000 mg | ORAL_TABLET | Freq: Four times a day (QID) | ORAL | Status: DC | PRN
  Administered 2021-05-03: 650 mg via ORAL
  Filled 2021-05-02: qty 2

## 2021-05-02 MED ORDER — INSULIN ASPART 100 UNIT/ML IJ SOLN
5.0000 [IU] | Freq: Once | INTRAMUSCULAR | Status: AC
  Administered 2021-05-02: 5 [IU] via SUBCUTANEOUS

## 2021-05-02 MED ORDER — POTASSIUM CHLORIDE CRYS ER 20 MEQ PO TBCR
40.0000 meq | EXTENDED_RELEASE_TABLET | Freq: Once | ORAL | Status: AC
Start: 2021-05-02 — End: 2021-05-02
  Administered 2021-05-02: 40 meq via ORAL
  Filled 2021-05-02: qty 2

## 2021-05-02 MED ORDER — HEPARIN BOLUS VIA INFUSION
4000.0000 [IU] | Freq: Once | INTRAVENOUS | Status: AC
  Administered 2021-05-02: 4000 [IU] via INTRAVENOUS
  Filled 2021-05-02: qty 4000

## 2021-05-02 MED ORDER — ISOSORBIDE MONONITRATE ER 30 MG PO TB24
15.0000 mg | ORAL_TABLET | Freq: Every day | ORAL | Status: DC
  Administered 2021-05-02 – 2021-05-05 (×4): 15 mg via ORAL
  Filled 2021-05-02 (×4): qty 1

## 2021-05-02 MED ORDER — ATORVASTATIN CALCIUM 80 MG PO TABS
80.0000 mg | ORAL_TABLET | Freq: Every day | ORAL | Status: DC
  Administered 2021-05-02 – 2021-05-04 (×3): 80 mg via ORAL
  Filled 2021-05-02 (×3): qty 1

## 2021-05-02 MED ORDER — CLOPIDOGREL BISULFATE 75 MG PO TABS
75.0000 mg | ORAL_TABLET | Freq: Every day | ORAL | Status: DC
  Administered 2021-05-02 – 2021-05-05 (×4): 75 mg via ORAL
  Filled 2021-05-02 (×4): qty 1

## 2021-05-02 MED ORDER — ASPIRIN EC 81 MG PO TBEC
81.0000 mg | DELAYED_RELEASE_TABLET | Freq: Every day | ORAL | Status: DC
  Administered 2021-05-03 – 2021-05-05 (×3): 81 mg via ORAL
  Filled 2021-05-02 (×3): qty 1

## 2021-05-02 MED ORDER — FUROSEMIDE 10 MG/ML IJ SOLN
40.0000 mg | Freq: Two times a day (BID) | INTRAMUSCULAR | Status: DC
  Administered 2021-05-02 – 2021-05-04 (×5): 40 mg via INTRAVENOUS
  Filled 2021-05-02 (×5): qty 4

## 2021-05-02 MED ORDER — ACETAMINOPHEN 650 MG RE SUPP: 650.0000 mg | Freq: Four times a day (QID) | RECTAL | Status: DC | PRN

## 2021-05-02 MED ORDER — HEPARIN (PORCINE) 25000 UT/250ML-% IV SOLN
1200.0000 [IU]/h | INTRAVENOUS | Status: DC
  Administered 2021-05-02: 850 [IU]/h via INTRAVENOUS
  Filled 2021-05-02: qty 250

## 2021-05-02 NOTE — Progress Notes (Signed)
ANTICOAGULATION CONSULT NOTE - Initial Consult ? ?Pharmacy Consult for IV heparin ?Indication: chest pain/ACS ? ?Allergies  ?Allergen Reactions  ? Cedar Swelling  ?   ?  ? Other Swelling  ?  Teriyaki  ? ? ?Patient Measurements: ?Height: '5\' 1"'$  (154.9 cm) ?Weight: 85.7 kg (189 lb) ?IBW/kg (Calculated) : 47.8 ?Heparin Dosing Weight: 67.5 kg ? ?Vital Signs: ?Temp: 98.5 ?F (36.9 ?C) (03/12 1242) ?Temp Source: Oral (03/12 1242) ?BP: 141/93 (03/12 1242) ?Pulse Rate: 79 (03/12 1242) ? ?Labs: ?Recent Labs  ?  05/02/21 ?0420 05/02/21 ?1505 05/02/21 ?0930  ?HGB 10.5*  --   --   ?HCT 32.7*  --   --   ?PLT 318  --   --   ?CREATININE 3.21*  --   --   ?TROPONINIHS 55* 147* 529*  ? ? ?Estimated Creatinine Clearance: 19.5 mL/min (A) (by C-G formula based on SCr of 3.21 mg/dL (H)). ? ? ?Medical History: ?Past Medical History:  ?Diagnosis Date  ? Cervical cancer ([place])   ? cryotherapy and colposcopy/[place]; 1991  ? CHF (congestive heart failure) ([place]) 07-2014  ? Diabetes mellitus without complication ([place]) 6979  ? High cholesterol   ? Hypertension   ? MI (myocardial infarction) [facility])   ? 2004  ? Stroke [facility]) 09/2014, 2013  ? Vaginal Pap smear, abnormal   ? ? ?Assessment: ?58 YOF admitted for chest pain and undergoing ACS work-up. No history of anticoagulation PTA. Patient with known CAD, last cardiac cath on March 23, 2021 showed severe multivessel disease and occluded RCA stent and was not candidate for surgery. Pharmacy consulted to dose IV heparin. ? ?Hgb 10.5 and appears to be stable. Renal function appears to be at baseline. ? ?Goal of Therapy:  ?Heparin level 0.3-0.7 units/ml ?Monitor platelets by anticoagulation protocol: Yes ?  ?Plan:  ?IV heparin 4000 unit bolus x1 ?Start IV heparin gtt at 850 units/h ?6 hour heparin level ?Daily heparin level, CBC ?Monitor for signs and symptoms of bleeding ? ?Thank you for involving pharmacy in this patient's care. ? ?Elita Quick, PharmD ?PGY1 Ambulatory Care Pharmacy  Resident ?05/02/2021 2:11 PM ? ?**Pharmacist phone directory can be found on [email or web address] listed under [place]** ? ? ?

## 2021-05-02 NOTE — Assessment & Plan Note (Signed)
#)   acquired hypothyroidism: documented h/o such, on Synthroid as outpatient.   Plan: cont home Synthroid.     

## 2021-05-02 NOTE — Assessment & Plan Note (Addendum)
Hyponatremia. Hypokalemia. Non anion gap metabolic acidosis.  Renal function with serum cr at 3,4 with K at 3,8 and serum bicarbonate at 18. Plan to continue diuresis with furosemide and follow up renal function with the am. Avoid hypotension and nephrotoxic medications.  Add KCl supplementation today.

## 2021-05-02 NOTE — Assessment & Plan Note (Deleted)
#)   Acute on chronic systolic/diastolic heart failure: In the context of a documented history of chronic systolic/diastolic heart failure with most recent echocardiogram on 03/20/2021 notable for LVEF 40 to 45% as well as grade 1 diastolic dysfunction, patient presents with shortness of breath associated with orthopnea and chest x-ray showing evidence of acutely decompensated heart failure, as further detailed above.  BNP of approximately 1100, is similar to most recent prior value from approximately 10 days ago.  Patient diuretic regimen notable for Lasix 40 mg p.o. twice daily. ?  ?Plan: Start Lasix 40 mg IV twice daily.  Monitor strict I's and O's and daily weights.  Monitor on symmetry.  Add on serum magnesium level.  Potassium chloride 40 mill equivalents p.o. x1 dose now.  Cardiology consulted, as above.  Trend troponin.  Continue home beta-blocker. ?  ?

## 2021-05-02 NOTE — Progress Notes (Signed)
Pt oxygen saturation 60% with good pleth on monitor.  Upon entry of room, pt resting with eyes closed in no acute distress. O2 sat 94% on room air; awakened patient and denied any c/o at present time or hx of OSA.  Placed pt on [place] at 2L and updated assigned RN, [place]. ?

## 2021-05-02 NOTE — Care Management Obs Status (Signed)
MEDICARE OBSERVATION STATUS NOTIFICATION ? ? ?Patient Details  ?Name: Monica Carlson ?MRN: [id number] ?Date of Birth: Jun 27, 1965 ? ? ?Medicare Observation Status Notification Given:  Yes ? ? ? ?Rayann Jolley G., RN ?05/02/2021, 1:47 PM ?

## 2021-05-02 NOTE — ED Triage Notes (Signed)
Pt bib [facility] c/o chest pain and SOB. Chest pain resolved upon EMS arrival. Pt took * baby ASA and 1 nitro. HR 80s, O2 90% RA, 95% 3L [place], BP 160/100. ?

## 2021-05-02 NOTE — Progress Notes (Signed)
?Progress Note ? ? ?Patient: Monica Carlson TIW:[id number] DOB: 1965/09/08 DOA: 05/02/2021     0 ?DOS: the patient was seen and examined on 05/02/2021 ?  ?Brief hospital course: ?Monica Carlson was admitted to the hospital with the working diagnosis of NSEMI, complicated with acute heart failure decompensation  ? ?56 yo female with the past medical history of coronary artery disease, type 2 diabetes mellitus, heart failure, dyslipidemia, stage IV chronic kidney disease and obesity class 2, who presented with chest pain. She reported acute onset of chest pain, not exercise related, associated with dyspnea that resolved after one dose of nitroglycerin. On her initial physical examination her blood pressure was 148/64, HR 68, RR 24 and oxygen saturation 94% on room air. Lungs with no wheezing or rales, heart with S1 and S2 present and rhythmic with no gallops or rubs, no murmurs, abdomen soft and no lower extremity edema.  ? ?Na 134, K 3.7. Cl 108, bicarbonate 15, glucose 441, bun 35 and cr 3,21  ?BNP 1,132 ?High sensitive troponin 55, 147, 529  ?Wbc 12.0 hgb 10.5 hvt 32 and plt 318 ?Sars covid 19 negative  ? ?Chest radiograph with cardiomegaly, bilateral hilar vascular congestion and bilateral symmetric interstitial infiltrates, fluid at the right fissure. Small bilateral pleural effusions.  ? ?EKG 93 bpm, normal axis with normal intervals, sinus rhythm, with ST segment depression in I and AVL, negative T wave in lead II, III, and AVF, V4 to V6.  (Old changes)  ? ?Patient was placed on heparin infusion for anticoagulation and started diuresis with IV furosemide.  ? ? ? ? ? ?Assessment and Plan: ?NSTEMI (non-ST elevated myocardial infarction) ([place]) ?Patient with no chest pain at the time of my evaluation ?High sensitive troponin trending up consistent with acute coronary syndrome. ? ?Patient know to have coronary artery disease with last cardiac catheterization with coronary angiography on 01/13 with severe multivessel  coronary artery disease, occluded RCA stent. ? ?CT surgery has deemed patient not candidate for bypass surgery due to comorbid conditions. ? ?Plan to continue anticoagulation with IV heparin. ?Continue isosorbide, asa and clopidogrel.  ?Continue with carvedilol. ?Holding on ace inh due to risk of worsening renal function. ?Follow with cardiology recommendations.  ? ? ?Acute on chronic combined systolic and diastolic CHF (congestive heart failure) ([place]) ?Echocardiogram from 02/2021 with a preserved LV systolic function with EF 40 to 45% with global hypokinesis. RV with preserved systolic function. No significant valvular disease.  ? ?Plan to continue diuresis with furosemide to achieve further negative fluid balance. ?Close follow up on blood pressure. ? ?Continue heart failure management with carvedilol.  ?No RAS inhibition due to risk of worsening renal function.  ? ?CKD (chronic kidney disease), stage IV ([place]) ?Hyponatremia.  ?Improved dyspnea today. ? ?Urine output not documented. ? ?Plan to continue diuresis with furosemide 40 mg IV q12 hrs and follow up renal function in am.  ?Kcl supplements to prevent hypokalemia.  ? ?Anemia of chronic renal disease, hgb is 10,5 and hctat 32.7  ? ? ?Hypothyroidism ?#) acquired hypothyroidism: documented h/o such, on Synthroid as outpatient.  ?  ?Plan: cont home Synthroid.  ?  ?  ?  ? ?Uncontrolled type 2 diabetes mellitus with hyperglycemia ([place]) ?Continue glucose cover and monitoring with insulin sliding scale. ?Patient is tolerating po well.  ? ?Class 2 obesity ?Calculated BMI is 35.7  ? ? ? ? ?  ? ?Subjective: patient with improved dyspnea and no further chest pain  ? ?Physical Exam: ?Vitals:  ?  05/02/21 1100 05/02/21 1147 05/02/21 1200 05/02/21 1242  ?BP: 111/68   (!) 141/93  ?Pulse: 65   79  ?Resp: 18  (!) 29 18  ?Temp:  97.8 ?F (36.6 ?C)  98.5 ?F (36.9 ?C)  ?TempSrc:  Oral  Oral  ?SpO2: 98%   96%  ?Weight:      ?Height:      ? ?Neurology awake and alert ?ENT with no  pallor ?Cardiovascular with S1 and S2 present and rhythmic with no gallops or murmurs, no rubs ?No JVD ?No lower extremity edema ?Respiratory with scattered rales but no wheezing, no rhonchi ?Abdomen soft, protuberant but not tender  ?Data Reviewed: ? ? ? ?Family Communication: no family at the bedside  ? ?Disposition: ?Status is: Observation ?The patient remains OBS appropriate and will d/c before 2 midnights. ? Planned Discharge Destination: Home ? ?Author: ?Tawni Millers, MD ?05/02/2021 1:59 PM ? ?For on call review [email or web address].  ?

## 2021-05-02 NOTE — ED Notes (Signed)
Patient transported to X-ray 

## 2021-05-02 NOTE — H&P (Signed)
History and Physical    PLEASE NOTE THAT DRAGON DI[employer] SOFTWARE WAS USED IN THE CONSTRUCTION OF THIS NOTE.   Monica Carlson TUU:[id number] DOB: 01/21/1966 DOA: 05/02/2021  PCP: [facility] [place]  Patient coming from: home   I have personally briefly reviewed patient's old medical records in [place]  Chief Complaint: Chest pain  HPI: Monica Carlson is a 56 y.o. female with medical history significant for coronary artery disease, type 2 diabetes mellitus, chronic systolic/diastolic heart failure, hypertension, hyperlipidemia, stage IV chronic kidney disease with baseline creatinine 3.2-3.5, who is [facility] to [facility] on 05/02/2021 for further evaluation management of her presenting chest pain.  She reports a single episode of substernal chest pressure, nonradiating, starting at rest at 2300 on 05/01/2021.  Chest pain was nonexertional, nonpleuritic, nonpositional, not reproducible to palpation over the anterior chest wall.  Was associated shortness of breath in the absence of any associated palpitations, diaphoresis, nausea, vomiting, dizziness, presyncope, or syncope.  Chest pain completely resolved following sublingual nitroglycerin x1 administered in the emergency department this evening.  No residual or recurrent chest pain.  She reports that her presenting chest pain is very similar to that which she has experienced at times of her prior hospitalizations for NSTEMI, including hospitalization for such on 04/21/2021.  She has a known history of coronary artery disease, with most recent left coronary angiography on 03/23/2021 via Dr. Irish Lack Showing severe multivessel disease.  Per chart review, cardiology is planning response to this heart cath showing severe multivessel disease was to consult CT surgery.  While the patient reports resolution of her single episode of chest pain earlier in the day, she continues to report mild shortness of breath associate  with some orthopnea, but denies any worsening of edema in the bilateral extremities.  Denies any associated subjective fever, chills, rigors, generalized myalgias.  No recent trauma.  Of note, most recent echocardiogram occurred on 03/12/2021 and was notable for LVEF 40 to 45%, global hypokinesis of the left ventricle, grade 1 diastolic dysfunction, and mild mitral regurgitation.    ED Course:  Vital signs in the ED were notable for the following: Afebrile; heart rate 68-92; blood pressure 148/64; respiratory 24, oxygen saturation 94% on room air.  Labs were notable for the following: BMP notable for the following: Potassium 3.5, creatinine 3.21 compared to most recent prior value 3.5503 323, glucose 441.  BNP 1132 compared to most recent prior value of 118013 123.  High-sensitivity troponin I 55, compared to most recent prior value of 867 on 04/22/2021.  CBC notable for hemoglobin 10.5 compared to 9.1 on 04/23/2021.  COVID-19/influenza PCR results currently pending.  Imaging and additional notable ED work-up: EKG, in comparison to most recent prior EKG from 04/21/2021 demonstrates sinus rhythm, heart rate 93, nonspecific intraventricular conduction delay, T wave inversion in leads II, 3, aVF, V3 through V6, all of which were present on most recent prior EKG, will demonstrate no evidence of ST changes, including no evidence of ST elevation.  Chest x-ray shows cardiomegaly with interstitial opacities and early B-lines consistent with acutely decompensated heart failure, in the absence of any evidence of infiltrate, effusion, pneumothorax.  EDP discussed patient's case with the on-call cardiologist, Dr. Humphrey Rolls, Who will formally consult, with additional recommendations pending at this time.  Subsequently, the patient was admitted for observation for further evaluation management of presenting chest pain, with presentation also notable for acute on chronic systolic/diastolic heart failure.     Review of  Systems: As per HPI otherwise 10 point review of systems negative.   Past Medical History:  Diagnosis Date   Cervical cancer ([place])    cryotherapy and colposcopy/[place]; 1991   CHF (congestive heart failure) ([place]) 07-2014   Diabetes mellitus without complication ([place]) 4315   High cholesterol    Hypertension    MI (myocardial infarction) ([place])    2004   Stroke ([place]) 09/2014, 2013   Vaginal Pap smear, abnormal     Past Surgical History:  Procedure Laterality Date   CHOLECYSTECTOMY  1992   CORONARY STENT PLACEMENT  2004   LEFT HEART CATH AND CORONARY ANGIOGRAPHY N/A 03/23/2021   Procedure: LEFT HEART CATH AND CORONARY ANGIOGRAPHY;  Surgeon: Jettie Booze, MD;  Location: [place] [facility];  Service: Cardiovascular;  Laterality: N/A;   TUBAL LIGATION      Social History:  reports that she has never smoked. She has never used smokeless tobacco. She reports that she does not drink alcohol and does not use drugs.   Allergies  Allergen Reactions   Cedar     Swelling    Other     Teriyaki- swelling    Family History  Problem Relation Age of Onset   Migraines Mother    Heart disease Father    Hypertension Father    Diabetes Father    Migraines Maternal Grandfather    Heart attack Maternal Grandfather    Lung cancer Paternal Grandmother    Colon cancer Neg Hx    Colon polyps Neg Hx     Family history reviewed and not pertinent    Prior to Admission medications   Medication Sig Start Date End Date Taking? Authorizing Provider  acetaminophen (TYLENOL) 500 MG tablet Take 1,000-1,500 mg by mouth every 6 (six) hours as needed for mild pain.    [provider]  albuterol (VENTOLIN HFA) 108 (90 Base) MCG/ACT inhaler Inhale 2 puffs into the lungs every 4 (four) hours as needed for shortness of breath. 02/27/21   [provider]  amLODipine (NORVASC) 5 MG tablet Take 5 mg by mouth daily. 08/05/20   [provider]  aspirin EC 81 MG EC  tablet Take 1 tablet (81 mg total) by mouth daily. Swallow whole. 03/25/21 06/23/21  [place] Donnamarie Poag, DO  atorvastatin (LIPITOR) 80 MG tablet Take 1 tablet (80 mg total) by mouth at bedtime. 03/24/21 06/22/21  [place] Donnamarie Poag, DO  Blood Glucose Monitoring Suppl (ACCU-CHEK GUIDE ME) w/Device KIT 1 Piece by Does not apply route as directed. 07/09/18   Cassandria Anger, MD  carvedilol (COREG) 6.25 MG tablet Take 6.25 mg by mouth 2 (two) times daily. 02/09/21   [provider]  Cholecalciferol (VITAMIN D) 50 MCG (2000 UT) CAPS Take 2,000 Units by mouth daily.    [provider]  clopidogrel (PLAVIX) 75 MG tablet TAKE ONE TABLET BY MOUTH EVERY DAY WITH BREAKFAST Patient taking differently: Take 75 mg by mouth daily. 07/10/17   Caren Macadam, MD  ezetimibe (ZETIA) 10 MG tablet Take 1 tablet (10 mg total) by mouth daily. 04/15/21   Sueanne Margarita, MD  furosemide (LASIX) 40 MG tablet Take 1 tablet (40 mg total) by mouth 2 (two) times daily. 04/23/21   Barton Dubois, MD  gabapentin (NEURONTIN) 300 MG capsule Take 300 mg by mouth 2 (two) times daily. 10/11/17   [provider]  glucose blood (ACCU-CHEK GUIDE) test strip Use as instructed 4 x daily. e11.65 07/10/18   Nida,  Marella Chimes, MD  glucose blood test strip 1 each by Other route 2 (two) times daily. Use as instructed bid. E11.65 12/18/18   Cassandria Anger, MD  icosapent Ethyl (VASCEPA) 1 g capsule Take 2 capsules (2 g total) by mouth 2 (two) times daily. With food 04/15/21   Sueanne Margarita, MD  insulin detemir (LEVEMIR) 100 UNIT/ML FlexPen Inject 15 Units into the skin at bedtime. 04/23/21   Barton Dubois, MD  Insulin Pen Needle (PEN NEEDLES) 32G X 5 MM MISC Use 1 needle daily to inject insulin as instructed. 04/23/21   Barton Dubois, MD  isosorbide mononitrate (IMDUR) 30 MG 24 hr tablet Take 0.5 tablets (15 mg total) by mouth daily. 04/24/21   Barton Dubois, MD  JANUVIA 50 MG tablet TAKE 1 TABLET BY MOUTH EVERY DAY Patient  taking differently: Take 50 mg by mouth daily. 02/23/21   Brita Romp, NP  Lancets Thin MISC 1 each by Does not apply route 4 (four) times daily. E11.65 Pt has Prodigy meter 10/02/18   Nida, Marella Chimes, MD  levothyroxine (SYNTHROID) 125 MCG tablet TAKE 1 TABLET BY MOUTH DAILY BEFORE BREAKFAST Patient taking differently: Take 125 mcg by mouth daily before breakfast. 02/23/21   Brita Romp, NP  nitroGLYCERIN (NITROSTAT) 0.4 MG SL tablet Place 1 tablet (0.4 mg total) under the tongue every 5 (five) minutes as needed for chest pain. 08/15/17   Caren Macadam, MD  potassium chloride SA (KLOR-CON M) 20 MEQ tablet Take 20 mEq by mouth 2 (two) times daily. 03/03/21   [provider]  sodium bicarbonate 650 MG tablet Take 1 tablet (650 mg total) by mouth 2 (two) times daily. 03/24/21 06/22/21  [place] Donnamarie Poag, DO  traZODone (DESYREL) 50 MG tablet Take 50 mg by mouth at bedtime as needed. 08/05/20   [provider]  vitamin C (ASCORBIC ACID) 500 MG tablet Take 500 mg by mouth daily. 02/27/21   [provider]  Zinc 50 MG TABS Take 50 mg by mouth daily. 02/27/21   [provider]     Objective    Physical Exam: Vitals:   05/02/21 0359 05/02/21 0403  BP:  (!) 171/107  Pulse:  92  Resp:  (!) 24  Temp:  97.6 F (36.4 C)  TempSrc:  Oral  SpO2:  96%  Weight: 85.7 kg   Height: _0  (1.549 m)     General: appears to be stated age; alert, oriented Skin: warm, dry, no rash Head:  AT/[place] Mouth:  Oral mucosa membranes appear moist, normal dentition Neck: supple; trachea midline Heart:  RRR; did not appreciate any M/R/G Lungs: CTAB, did not appreciate any wheezes, rales, or rhonchi Abdomen: + BS; soft, ND, NT Vascular: 2+ pedal pulses b/l; 2+ radial pulses b/l Extremities: no peripheral edema, no muscle wasting Neuro: strength and sensation intact in upper and lower extremities b/l    Labs on Admission: I have personally reviewed following labs and imaging  studies  CBC: Recent Labs  Lab 05/02/21 0420  WBC 12.0*  HGB 10.5*  HCT 32.7*  MCV 99.7  PLT 300   Basic Metabolic Panel: Recent Labs  Lab 05/02/21 0420 05/02/21 0555  NA 134*  --   K 3.7  --   CL 108  --   CO2 15*  --   GLUCOSE 441*  --   BUN 35*  --   CREATININE 3.21*  --   CALCIUM 8.8*  --   MG  --  2.2   GFR: Estimated Creatinine Clearance: 19.5 mL/min (A) (by C-G formula based on SCr of 3.21 mg/dL (H)). Liver Function Tests: No results for input(s): AST, ALT, ALKPHOS, BILITOT, PROT, ALBUMIN in the last 168 hours. No results for input(s): LIPASE, AMYLASE in the last 168 hours. No results for input(s): AMMONIA in the last 168 hours. Coagulation Profile: No results for input(s): INR, PROTIME in the last 168 hours. Cardiac Enzymes: No results for input(s): CKTOTAL, CKMB, CKMBINDEX, TROPONINI in the last 168 hours. BNP (last 3 results) No results for input(s): PROBNP in the last 8760 hours. HbA1C: No results for input(s): HGBA1C in the last 72 hours. CBG: No results for input(s): GLUCAP in the last 168 hours. Lipid Profile: No results for input(s): CHOL, HDL, LDLCALC, TRIG, CHOLHDL, LDLDIRECT in the last 72 hours. Thyroid Function Tests: No results for input(s): TSH, T4TOTAL, FREET4, T3FREE, THYROIDAB in the last 72 hours. Anemia Panel: No results for input(s): VITAMINB12, FOLATE, FERRITIN, TIBC, IRON, RETICCTPCT in the last 72 hours. Urine analysis:    Component Value Date/Time   COLORURINE YELLOW 06/19/2014 0846   APPEARANCEUR TURBID (A) 06/19/2014 0846   LABSPEC 1.029 06/19/2014 0846   PHURINE 5.5 06/19/2014 0846   GLUCOSEU >1000 (A) 06/19/2014 0846   HGBUR MODERATE (A) 06/19/2014 0846   BILIRUBINUR NEGATIVE 06/19/2014 0846   KETONESUR NEGATIVE 06/19/2014 0846   PROTEINUR 30 (A) 06/19/2014 0846   UROBILINOGEN 0.2 06/19/2014 0846   NITRITE POSITIVE (A) 06/19/2014 0846   LEUKOCYTESUR LARGE (A) 06/19/2014 0846    Radiological Exams on Admission: DG  Chest 2 View  Result Date: 05/02/2021 CLINICAL DATA:  Chest pain EXAM: CHEST - 2 VIEW COMPARISON:  04/21/2021 FINDINGS: Cardiomegaly and vascular pedicle widening. Low volume chest with greater elevation of the right diaphragm. Interstitial opacity with Kerley lines and fissure thickening. No pneumothorax. IMPRESSION: CHF. Electronically Signed   By: Jorje Guild M.D.   On: 05/02/2021 04:41     EKG: Independently reviewed, with result as described above.    Assessment/Plan   Principal Problem:   Chest pain Active Problems:   Hypothyroidism   Mixed hyperlipidemia   Uncontrolled type 2 diabetes mellitus with hyperglycemia (HCC)   Acute on chronic combined systolic and diastolic CHF (congestive heart failure) ([place])   Hypertension     #) Chest pain: Single episode of substernal chest pressure that was nonexertional, but resolved with sublingual nitroglycerin x1, without any residual chest discomfort.  This in the context of a known history of coronary disease, with most recent coronary angiography on 03/23/2021 showing evidence of severe multivessel disease, with plan for cardiology to discuss CT surgery.  Outcome of the discussions with CT surgery not entirely clear to me at this time.  Of note, initial troponin found to be 55, which is significantly lower than most recent prior value of 867 on 04/22/2021 at time of most recent NSTEMI hospitalization.  EKG shows no evidence of acute ischemic changes relative to most recent prior, as further detailed above.  On-call cardiology, Dr. Juanita Laster, with additional recommendations pending at this time.  Plan: Full dose aspirin x1.  Resume home high intensity atorvastatin.  Resume home beta-blocker.  Trend troponin.  Monitor on telemetry.  Potassium chloride 40 mEq p.o. x1 dose.  Add on serum magnesium level.  Cardiology consulted, with additional recommendations pending at this time, as above.  Resume home dual antiplatelet therapy.      #)  Acute on chronic systolic/diastolic heart failure: In the context of a  documented history of chronic systolic/diastolic heart failure with most recent echocardiogram on 03/20/2021 notable for LVEF 40 to 45% as well as grade 1 diastolic dysfunction, patient presents with shortness of breath associated with orthopnea and chest x-ray showing evidence of acutely decompensated heart failure, as further detailed above.  BNP of approximately 1100, is similar to most recent prior value from approximately 10 days ago.  Patient diuretic regimen notable for Lasix 40 mg p.o. twice daily.  Plan: Start Lasix 40 mg IV twice daily.  Monitor strict I's and O's and daily weights.  Monitor on symmetry.  Add on serum magnesium level.  Potassium chloride 40 mill equivalents p.o. x1 dose now.  Cardiology consulted, as above.  Trend troponin.  Continue home beta-blocker.        #) Type 2 Diabetes Mellitus: documented history of such. Home insulin regimen: Levemir 15 units subcu nightly.  Also on Januvia as an outpatient.  Presenting blood sugar: 441.    Plan: NovoLog 5 units subcu x1 dose now, followed by accuchecks QAC and HS with low dose SSI.  For now I have held basal insulin.  Hold home Januvia.       #) Essential Hypertension: documented h/o such, with outpatient antihypertensive regimen including Imdur, Coreg, Norvasc.  SBP's in the ED today: In the 140s.  We will continue home beta-blocker, Imdur, but hold home Norvasc in the context of presenting chest pain and elevated troponin as there is a relative contraindication for calcium channel blockers in the setting of ACS.  Plan: Close monitoring of subsequent BP via routine VS. hold home Norvasc.  Continue Imdur and         #) Hyperlipidemia: documented h/o such. On high intensity atorvastatin as well as Zetia as outpatient.    Plan: continue home statin in addition to Zetia.          #) acquired hypothyroidism: documented h/o such, on  Synthroid as outpatient.   Plan: cont home Synthroid.         #) Stage IV CKD: Documented history of such, with baseline creatinine range noted to be 3.2-3.5, with presenting serum creatinine found to be consistent with his baseline range.  Close monitoring of ensuing renal function, particular given plan for IV diuresis, as above.  Plan: Monitor strict I's and O's and daily weights.  Repeat BMP in the morning.     DVT prophylaxis: SCD's   Code Status: Full code Family Communication: none Disposition Plan: Per Rounding Team Consults called: On-call cardiologist, Dr.Khan Consulted, as further detailed above;  Admission status: Observation    PLEASE NOTE THAT DRAGON DICTATION SOFTWARE WAS USED IN THE CONSTRUCTION OF THIS NOTE.   [place] DO [facility]  From [place]   05/02/2021, 6:55 AM

## 2021-05-02 NOTE — Assessment & Plan Note (Signed)
#)   Hyperlipidemia: documented h/o such. On high intensity atorvastatin as well as Zetia as outpatient.  ?  ?  ?Plan: continue home statin in addition to Zetia.  ?  ?  ?

## 2021-05-02 NOTE — Consult Note (Signed)
Cardiology Consultation:   Patient ID: Monica Carlson MRN: [id number]; DOB: 1965/05/31  Admit date: 05/02/2021 Date of Consult: 05/02/2021  PCP:  Gwenlyn Saran, [place] Providers Cardiologist:  None        Patient Profile:   Monica Carlson is a 56 y.o. female with a hx of CAD prior RCA stent, cardiomyopathy chronic combined systolic and diastolic CHF, CKD-4 HTN, HLD DM CVAs and recent NSTEMI with significant CAD that would require CABG but with TCTS consult not felt to be a candidate due to renal disease, deconditioning, poor targets for grafting, recent COVID plans to treat medically who is being seen 05/02/2021 for the evaluation of chest pain at the request of Dr. Cathlean Sauer.  History of Present Illness:   Monica Carlson is a 56 y.o. female with a hx of CAD prior RCA stent, cardiomyopathy chronic combined systolic and diastolic CHF, CKD-4 HTN, HLD DM CVAs and recent NSTEMI with significant CAD that would require CABG but with TCTS consult not felt to be a candidate due to renal disease, deconditioning, poor targets for grafting, recent COVID plans to treat medically who is being seen 05/02/2021 for the evaluation of chest pain at the request of Dr. Cathlean Sauer. Of note, patient had a very similar presentation on 04/21/21 when she was treated with heparin and nitro for 48 hrs leading to resolution of pain. Decision was made to treat her medically and Imdur was added. Since then she was doing okay but tonight at 11 pm suddenly started having similar chest pain hence came to the ED. She states that she took '648mg'$  of aspirin and some SL nitro PTA with mild relief. She states that the pain has resolved now, but she still feels short of breath.  She denies fever, chills, or cough. VS overall stable. BP 160s. Oxygen saturation was 90 on RA hence placed on 3 liters of oxygen. Chest pain was nonexertional, nonpleuritic, nonpositional, not reproducible to palpation over the anterior chest  wall.  Denies any associated subjective fever, chills, rigors, generalized myalgias. Her recent echo showed EF 40%. In the ED troponin 55 to 147. BNP 1132. EKG shows no new changes.   Of note, she has been evaluated by CTS previously "TCTS -Dr. Prescott Gum evaluated and in "Summary the patient is a poor candidate for multivessel CABG due to her significant comorbid renal disease, deconditioning, poor targets for grafting, and recent COVID pneumonitis.  If no PCI options I would treat her medically and I could see her back in the office after she has more time to recover from the COVID pneumonitis." Discharged on ASA 81, amlodipine 5 coreg 6.25 BID, Imdur, plavix 75 lasix 20 BID,  could not be on spiro, ARB, ACE, with CKD4   Past Medical History:  Diagnosis Date   Cervical cancer ([place])    cryotherapy and colposcopy/[place], [place]; 1991   CHF (congestive heart failure) ([place]) 07-2014   Diabetes mellitus without complication ([place]) 4680   High cholesterol    Hypertension    MI (myocardial infarction) ([place])    2004   Stroke ([place]) 09/2014, 2013   Vaginal Pap smear, abnormal     Past Surgical History:  Procedure Laterality Date   CHOLECYSTECTOMY  1992   CORONARY STENT PLACEMENT  2004   LEFT HEART CATH AND CORONARY ANGIOGRAPHY N/A 03/23/2021   Procedure: LEFT HEART CATH AND CORONARY ANGIOGRAPHY;  Surgeon: Jettie Booze, MD;  Location: [facility];  Service: Cardiovascular;  Laterality: N/A;   TUBAL LIGATION         Inpatient Medications: Scheduled Meds:  [START ON 05/03/2021] aspirin  81 mg Oral Daily   aspirin  325 mg Oral Once   atorvastatin  80 mg Oral QHS   carvedilol  6.25 mg Oral BID   clopidogrel  75 mg Oral Daily   ezetimibe  10 mg Oral Daily   furosemide  40 mg Intravenous BID   glucose blood  1 each Other BID   insulin aspart  0-9 Units Subcutaneous TID WC   insulin aspart  5 Units Subcutaneous Once   isosorbide mononitrate  15 mg Oral Daily   levothyroxine  125  mcg Oral QAC breakfast   potassium chloride  40 mEq Oral Once   Continuous Infusions:  PRN Meds: acetaminophen **OR** acetaminophen, nitroGLYCERIN  Allergies:    Allergies  Allergen Reactions   Cedar     Swelling    Other     Teriyaki- swelling    Social History:   Social History   Socioeconomic History   Marital status: Legally Separated    Spouse name: Not on file   Number of children: Not on file   Years of education: Not on file   Highest education level: Not on file  Occupational History   Not on file  Tobacco Use   Smoking status: Never   Smokeless tobacco: Never  Vaping Use   Vaping Use: Never used  Substance and Sexual Activity   Alcohol use: No   Drug use: No   Sexual activity: Yes    Birth control/protection: None, Surgical    Comment: tubal  Other Topics Concern   Not on file  Social History Narrative   Lives alone.    Has some friends.    Neighbors.    Social Determinants of Health   Financial Resource Strain: Not on file  Food Insecurity: Not on file  Transportation Needs: Not on file  Physical Activity: Not on file  Stress: Not on file  Social Connections: Not on file  Intimate Partner Violence: Not on file    Family History:   Family History  Problem Relation Age of Onset   Migraines Mother    Heart disease Father    Hypertension Father    Diabetes Father    Migraines Maternal Grandfather    Heart attack Maternal Grandfather    Lung cancer Paternal Grandmother    Colon cancer Neg Hx    Colon polyps Neg Hx      ROS:  Please see the history of present illness.  All other ROS reviewed and negative.     Physical Exam/Data:   Vitals:   05/02/21 0359 05/02/21 0403  BP:  (!) 171/107  Pulse:  92  Resp:  (!) 24  Temp:  97.6 F (36.4 C)  TempSrc:  Oral  SpO2:  96%  Weight: 85.7 kg   Height: '5\' 1"'$  (1.549 m)    No intake or output data in the 24 hours ending 05/02/21 0657 Last 3 Weights 05/02/2021 04/23/2021 04/21/2021  Weight  (lbs) 189 lb 185 lb 3 oz 183 lb 12.8 oz  Weight (kg) 85.73 kg 84 kg 83.371 kg     Body mass index is 35.71 kg/m.  General:  Well nourished, well developed, moderate distress HEENT: normal Neck: no JVD Vascular: No carotid bruits; Distal pulses 2+ bilaterally Cardiac:  normal S1, S2; tachycardic Lungs:  Crackles at bases; poor airflow Abd: soft, nontender, no hepatomegaly  Ext: no edema Musculoskeletal:  No deformities, BUE and BLE strength normal and equal Skin: warm and dry  Neuro:  CNs 2-12 intact, no focal abnormalities noted Psych:  Normal affect   EKG:  The EKG was personally reviewed and demonstrates:  no ST elevation   Relevant CV Studies:  Echo: 03/20/21    IMPRESSIONS     1. Left ventricular ejection fraction, by estimation, is 40 to 45%. The  left ventricle has mildly decreased function. The left ventricle  demonstrates global hypokinesis. Left ventricular diastolic parameters are  consistent with Grade I diastolic  dysfunction (impaired relaxation). Elevated left atrial pressure.   2. Right ventricular systolic function is normal. The right ventricular  size is normal. Tricuspid regurgitation signal is inadequate for assessing  PA pressure.   3. The mitral valve is abnormal. Mild mitral valve regurgitation. No  evidence of mitral stenosis.   4. The aortic valve is tricuspid. There is moderate calcification of the  aortic valve. There is moderate thickening of the aortic valve. Aortic  valve regurgitation is not visualized. No aortic stenosis is present.   FINDINGS   Left Ventricle: Left ventricular ejection fraction, by estimation, is 40  to 45%. The left ventricle has mildly decreased function. The left  ventricle demonstrates global hypokinesis. The left ventricular internal  cavity size was normal in size. There is   no left ventricular hypertrophy. Left ventricular diastolic parameters  are consistent with Grade I diastolic dysfunction (impaired  relaxation).  Elevated left atrial pressure.   Right Ventricle: The right ventricular size is normal. Right vetricular  wall thickness was not well visualized. Right ventricular systolic  function is normal. Tricuspid regurgitation signal is inadequate for  assessing PA pressure.   Left Atrium: Left atrial size was normal in size.   Right Atrium: Right atrial size was normal in size.   Pericardium: There is no evidence of pericardial effusion.   Mitral Valve: The mitral valve is abnormal. There is mild thickening of  the mitral valve leaflet(s). There is mild calcification of the mitral  valve leaflet(s). Mild mitral annular calcification. Mild mitral valve  regurgitation. No evidence of mitral  valve stenosis.   Tricuspid Valve: The tricuspid valve is not well visualized. Tricuspid  valve regurgitation is not demonstrated. No evidence of tricuspid  stenosis.   Aortic Valve: The aortic valve is tricuspid. There is moderate  calcification of the aortic valve. There is moderate thickening of the  aortic valve. There is moderate aortic valve annular calcification. Aortic  valve regurgitation is not visualized. No  aortic stenosis is present. Aortic valve mean gradient measures 5.2 mmHg.  Aortic valve peak gradient measures 9.7 mmHg. Aortic valve area, by VTI  measures 1.53 cm.   Pulmonic Valve: The pulmonic valve was not well visualized. Pulmonic valve  regurgitation is not visualized. No evidence of pulmonic stenosis.   Aorta: The aortic root is normal in size and structure.   IAS/Shunts: No atrial level shunt detected by color flow Doppler.      Cardiac cath: 03/23/2021 Conclusion       Mid RCA lesion is 100% stenosed.  Left to right collaterals.   2nd Mrg lesion is 90% stenosed.   Ost LAD to Mid LAD lesion is 75% stenosed.   1st Diag lesion is 75% stenosed.   LV end diastolic pressure is mildly elevated.   There is no aortic valve stenosis.   Severe, multivessel  disease.  Diffuse disease throughout the left system.  Occluded  mid RCA stent.  Large right PDA and distal RCA system which fills by collaterals from the left.  Restart heparin in 8 hours.  Discussed with Dr. Martin[place].  Will obtain CVTS consult.  Contrast minimize due to renal dysfunction. Diagnostic Dominance: Right Intervention  Laboratory Data:  High Sensitivity Troponin:   Recent Labs  Lab 04/21/21 0410 04/21/21 0616 04/22/21 0549 05/02/21 0420 05/02/21 0555  TROPONINIHS 181* 845* 867* 55* 147*     Chemistry Recent Labs  Lab 05/02/21 0420 05/02/21 0555  NA 134*  --   K 3.7  --   CL 108  --   CO2 15*  --   GLUCOSE 441*  --   BUN 35*  --   CREATININE 3.21*  --   CALCIUM 8.8*  --   MG  --  2.2  GFRNONAA 16*  --   ANIONGAP 11  --     No results for input(s): PROT, ALBUMIN, AST, ALT, ALKPHOS, BILITOT in the last 168 hours. Lipids No results for input(s): CHOL, TRIG, HDL, LABVLDL, LDLCALC, CHOLHDL in the last 168 hours.  Hematology Recent Labs  Lab 05/02/21 0420  WBC 12.0*  RBC 3.28*  HGB 10.5*  HCT 32.7*  MCV 99.7  MCH 32.0  MCHC 32.1  RDW 14.2  PLT 318   Thyroid No results for input(s): TSH, FREET4 in the last 168 hours.  BNP Recent Labs  Lab 05/02/21 0420  BNP 1,132.0*    DDimer No results for input(s): DDIMER in the last 168 hours.   Radiology/Studies:  DG Chest 2 View  Result Date: 05/02/2021 CLINICAL DATA:  Chest pain EXAM: CHEST - 2 VIEW COMPARISON:  04/21/2021 FINDINGS: Cardiomegaly and vascular pedicle widening. Low volume chest with greater elevation of the right diaphragm. Interstitial opacity with Kerley lines and fissure thickening. No pneumothorax. IMPRESSION: CHF. Electronically Signed   By: Jorje Guild M.D.   On: 05/02/2021 04:41     Assessment and Plan:   # CAD s/p CABG # NSTEMI  -No acute EKG changes.  -Cath Jan 2023 showed ostial LAD 75%, D1 75%, OM2 90%, RCA mid occlusion RPDA fills with collateral. Surgical anatomy. Seen by  CT surgery who stated patient not surgical candidate due to her significant comorbid renal disease, deconditioning, poor targets for grafting, and recent COVID pneumonitis. Patient was treated medically. Had similar presentation in March at that time Imdur was added, and plan was to treat medically as PCI would be high risk with minimal benefit.  -Now again concerning for NSTEMI -Heparin drip for now -Nitro drip for chest pain -C/w Aspirin and Plavix -C/w statin -Recurrent NSTEMI- will touch base with CTS and interventional cardiologist to see if any revasc can be attempted -Would have to way risk vs benefit particularly given renal dysfnction  # Acute systolic HF -Will start with IV Lasix 40 BID -GDMT limited by advanced CKD -Strict I/O. Serial weights -Repeat Echo   For questions or updates, please contact [place] Please consult [email or web address] for contact info under    Signed, Jaci Lazier, MD  05/02/2021 6:57 AM

## 2021-05-02 NOTE — Assessment & Plan Note (Deleted)
#)   Type 2 Diabetes Mellitus: documented history of such. Home insulin regimen: Levemir 15 units subcu nightly.  Also on Januvia as an outpatient.  Presenting blood sugar: 441.  ?  ?  ?Plan: NovoLog 5 units subcu x1 dose now, followed by accuchecks QAC and HS with low dose SSI.  For now I have held basal insulin.  Hold home Januvia. ?  ?

## 2021-05-02 NOTE — ED Notes (Signed)
Pt unable to take potassium tablets due to n/v. Howerter DO made aware. ?

## 2021-05-02 NOTE — Assessment & Plan Note (Addendum)
Echocardiogram from 02/2021 with a preserved LV systolic function with EF 40 to 45% with global hypokinesis. RV with preserved systolic function. No significant valvular disease.   Her volume status is improving Urine output over last 23 hrs is not documented.   Continue heart failure management with carvedilol.  No RAS inhibition due to risk of worsening renal function.   Continue diuresis with IV furosemide, possible transition to oral diuretic therapy in the next 24 hrs.

## 2021-05-02 NOTE — ED Provider Notes (Signed)
?[place] DEPT ?[facility] Emergency Department ?Provider Note ?MRN:  [id number]  ?Arrival date & time: 05/02/21    ? ?Chief Complaint   ?Shortness of Breath and Chest Pain ?  ?History of Present Illness   ?Monica Carlson is a 56 y.o. year-old female presents to the ED with chief complaint of chest pain that started about 11pm.  She states that she took '648mg'$  of aspirin and some SL nitro PTA with mild relief. She states that the pain has resolved, but she still feels short of breath.  She denies fever, chills, or cough. ? ?History provided by patient. ? ? ?Review of Systems  ?Pertinent review of systems noted in HPI.  ? ? ?Physical Exam  ? ?Vitals:  ? 05/02/21 0403  ?BP: (!) 171/107  ?Pulse: 92  ?Resp: (!) 24  ?Temp: 97.6 ?F (36.4 ?C)  ?SpO2: 96%  ?  ?CONSTITUTIONAL:  well-appearing, NAD ?NEURO:  Alert and oriented x 3, CN 3-12 grossly intact ?EYES:  eyes equal and reactive ?ENT/NECK:  Supple, no stridor  ?CARDIO:  normal rate, regular rhythm, appears well-perfused  ?PULM:  No respiratory distress, CTAB ?GI/GU:  non-distended,  ?MSK/SPINE:  No gross deformities, no edema, moves all extremities  ?SKIN:  no rash, atraumatic ? ? ?*Additional and/or pertinent findings included in MDM below ? ?Diagnostic and Interventional Summary  ? ? EKG Interpretation ? ?Date/Time:    ?Ventricular Rate:    ?PR Interval:    ?QRS Duration:   ?QT Interval:    ?QTC Calculation:   ?R Axis:     ?Text Interpretation:   ?  ? ?  ? ?Labs Reviewed  ?BASIC METABOLIC PANEL  ?CBC  ?I-STAT BETA HCG BLOOD, ED ([facility], [facility], [facility] ONLY)  ?TROPONIN I (HIGH SENSITIVITY)  ?  ?DG Chest 2 View    (Results Pending)  ?  ?Medications - No data to display  ? ?Procedures  /  Critical Care ?.Critical Care ?Performed by: Montine Circle, PA-C ?Authorized by: Montine Circle, PA-C  ? ?Critical care provider statement:  ?  Critical care time (minutes):  34 ?  Critical care was necessary to treat or prevent imminent or life-threatening deterioration of the  following conditions:  Circulatory failure ?  Critical care was time spent personally by me on the following activities:  Development of treatment plan with patient or surrogate, discussions with consultants, evaluation of patient's response to treatment, examination of patient, ordering and review of laboratory studies, ordering and review of radiographic studies, ordering and performing treatments and interventions, pulse oximetry, re-evaluation of patient's condition and review of old charts ? ?ED Course and Medical Decision Making  ?I have reviewed the triage vital signs, the nursing notes, and pertinent available records from the EMR. ? ?Complexity of Problems Addressed: ?High Complexity: Acute illness/injury posing a threat to life or bodily function, requiring emergent diagnostic workup, evaluation, and treatment as below. ?Comorbidities affecting this illness/injury include: ?NSTEMI, stroke, CHF ?Social Determinants Affecting Care: ?Complexity of care is increased due to . ? ? ?ED Course: ?After considering the following differential, ACS, PE, CHF, I ordered labs and CXR. ?I personally interpreted the labs which are notable for mildly elevated troponin. ?I visualized the chest x-ray which is notable for vascular congestion and agree with the radiologist interpretation.. ? ?  ? ?Consultants: ?I discussed the case with Hospitalist, Dr. Velia Meyer, who is appreciated for admitting. ?I consulted Dr. Humphrey Rolls, from cardiology, who will consult in the am. ?Treatment and Plan: ? ? ?Patient's exam and diagnostic  results are concerning for ACS.  Feel that patient will need admission to the hospital for further treatment and evaluation. ? ? ? ?Final Clinical Impressions(s) / ED Diagnoses  ? ?  ICD-10-CM   ?1. Elevated troponin  R77.8   ?  ?  ?ED Discharge Orders   ? ? None  ? ?  ?  ? ? ?Discharge Instructions Discussed with and Provided to Patient:  ? ?Discharge Instructions   ?None ?  ? ?  ?Montine Circle, PA-C ?05/02/21  1959 ? ?  ?Merrily Pew, MD ?05/02/21 [phone] ? ?

## 2021-05-02 NOTE — ED Notes (Signed)
ED TO INPATIENT HANDOFF REPORT  ED Nurse Name and Phone #: Baxter Flattery, RN  S Name/Age/Gender Monica Carlson 56 y.o. female Room/Bed: 024C/024C  Code Status   Code Status: Full Code  Home/SNF/Other Home Patient oriented to: self, place, time, and situation Is this baseline? Yes   Triage Complete: Triage complete  Chief Complaint Chest pain [R07.9]  Triage Note Pt bib [facility] c/o chest pain and SOB. Chest pain resolved upon EMS arrival. Pt took * baby ASA and 1 nitro. HR 80s, O2 90% RA, 95% 3L [place], BP 160/100.   Allergies Allergies  Allergen Reactions   Cedar Swelling        Other Swelling    Teriyaki    Level of Care/Admitting Diagnosis ED Disposition     ED Disposition  Admit   Condition  --   [place]: [place] [[id number]]  Level of Care: Progressive [102]  Admit to Progressive based on following criteria: MULTISYSTEM THREATS such as stable sepsis, metabolic/electrolyte imbalance with or without encephalopathy that is responding to early treatment.  May place patient in observation at [facility] or [place] if equivalent level of care is available:: No  Covid Evaluation: Asymptomatic Screening Protocol (No Symptoms)  Diagnosis: Chest pain [474259]  Admitting Physician: Rhetta Mura [[id number]]  Attending Physician: Rhetta Mura [[id number]]          B Medical/Surgery History Past Medical History:  Diagnosis Date   Cervical cancer ([place])    cryotherapy and [place]; 1991   CHF (congestive heart failure) ([place]) 07-2014   Diabetes mellitus without complication ([place]) 8841   High cholesterol    Hypertension    MI (myocardial infarction) ([place])    2004   Stroke ([place]) 09/2014, 2013   Vaginal Pap smear, abnormal    Past Surgical History:  Procedure Laterality Date   CHOLECYSTECTOMY  1992   CORONARY STENT PLACEMENT  2004   LEFT HEART CATH AND CORONARY ANGIOGRAPHY N/A 03/23/2021   Procedure: LEFT HEART  CATH AND CORONARY ANGIOGRAPHY;  Surgeon: Jettie Booze, MD;  Location: [facility];  Service: Cardiovascular;  Laterality: N/A;   TUBAL LIGATION       A IV Location/Drains/Wounds Patient Lines/Drains/Airways Status     Active Line/Drains/Airways     Name Placement date Placement time Site Days   Peripheral IV 04/21/21 22 G 1" Posterior;Right Forearm 04/21/21  2045  Forearm  11   Peripheral IV 05/02/21 20 G Left Antecubital 05/02/21  0704  Antecubital  less than 1            Intake/Output Last 24 hours No intake or output data in the 24 hours ending 05/02/21 1138  Labs/Imaging Results for orders placed or performed during the hospital encounter of 05/02/21 (from the past 48 hour(s))  Basic metabolic panel     Status: Abnormal   Collection Time: 05/02/21  4:20 AM  Result Value Ref Range   Sodium 134 (L) 135 - 145 mmol/L   Potassium 3.7 3.5 - 5.1 mmol/L   Chloride 108 98 - 111 mmol/L   CO2 15 (L) 22 - 32 mmol/L   Glucose, Bld 441 (H) 70 - 99 mg/dL    Comment: Glucose reference range applies only to samples taken after fasting for at least 8 hours.   BUN 35 (H) 6 - 20 mg/dL   Creatinine, Ser 3.21 (H) 0.44 - 1.00 mg/dL   Calcium 8.8 (L) 8.9 - 10.3 mg/dL  GFR, Estimated 16 (L) >60 mL/min    Comment: (NOTE) Calculated using the CKD-EPI Creatinine Equation (2021)    Anion gap 11 5 - 15    Comment: Performed at [facility], [place] [address], [place], [place] [address]  CBC     Status: Abnormal   Collection Time: 05/02/21  4:20 AM  Result Value Ref Range   WBC 12.0 (H) 4.0 - 10.5 K/uL   RBC 3.28 (L) 3.87 - 5.11 MIL/uL   Hemoglobin 10.5 (L) 12.0 - 15.0 g/dL   HCT 32.7 (L) 36.0 - 46.0 %   MCV 99.7 80.0 - 100.0 fL   MCH 32.0 26.0 - 34.0 pg   MCHC 32.1 30.0 - 36.0 g/dL   RDW 14.2 11.5 - 15.5 %   Platelets 318 150 - 400 K/uL   nRBC 0.0 0.0 - 0.2 %    Comment: Performed at [facility], [place] [address], [place], [place] [address]  Troponin I (High  Sensitivity)     Status: Abnormal   Collection Time: 05/02/21  4:20 AM  Result Value Ref Range   Troponin I (High Sensitivity) 55 (H) <18 ng/L    Comment: (NOTE) Elevated high sensitivity troponin I (hsTnI) values and significant  changes across serial measurements may suggest ACS but many other  chronic and acute conditions are known to elevate hsTnI results.  Refer to the "Links" section for chest pain algorithms and additional  guidance. Performed at [facility], [place] [address] [place], [place] [address]   Brain natriuretic peptide     Status: Abnormal   Collection Time: 05/02/21  4:20 AM  Result Value Ref Range   B Natriuretic Peptide 1,132.0 (H) 0.0 - 100.0 pg/mL    Comment: Performed at [facility] [address], [place], [place] [address]  I-Stat beta hCG blood, ED     Status: None   Collection Time: 05/02/21  4:24 AM  Result Value Ref Range   I-stat hCG, quantitative <5.0 <5 mIU/mL   Comment 3            Comment:   GEST. AGE      CONC.  (mIU/mL)   <=1 WEEK        5 - 50     2 WEEKS       50 - 500     3 WEEKS       100 - 10,000     4 WEEKS     1,000 - 30,000        FEMALE AND NON-PREGNANT FEMALE:     LESS THAN 5 mIU/mL   Troponin I (High Sensitivity)     Status: Abnormal   Collection Time: 05/02/21  5:55 AM  Result Value Ref Range   Troponin I (High Sensitivity) 147 (HH) <18 ng/L    Comment: CRITICAL RESULT CALLED TO, READ BACK BY AND VERIFIED WITH: A. Grandville Silos, [place] 05/02/21 A GASKINS (NOTE) Elevated high sensitivity troponin I (hsTnI) values and significant  changes across serial measurements may suggest ACS but many other  chronic and acute conditions are known to elevate hsTnI results.  Refer to the Links section for chest pain algorithms and additional  guidance. Performed at [facility], [place] [address] [place], [place] [address]   Magnesium     Status: None   Collection Time: 05/02/21  5:55 AM  Result Value Ref Range   Magnesium  2.2 1.7 - 2.4 mg/dL  Comment: Performed at [facility], [place] [address], [place], [place] [address]  Resp Panel by RT-PCR (Flu A&B, Covid) Nasopharyngeal Swab     Status: None   Collection Time: 05/02/21  7:09 AM   Specimen: Nasopharyngeal Swab; Nasopharyngeal(NP) swabs in vial transport medium  Result Value Ref Range   SARS Coronavirus 2 by RT PCR NEGATIVE NEGATIVE    Comment: (NOTE) SARS-CoV-2 target nucleic acids are NOT DETECTED.  The SARS-CoV-2 RNA is generally detectable in upper respiratory specimens during the acute phase of infection. The lowest concentration of SARS-CoV-2 viral copies this assay can detect is 138 copies/mL. A negative result does not preclude SARS-Cov-2 infection and should not be used as the sole basis for treatment or other patient management decisions. A negative result may occur with  improper specimen collection/handling, submission of specimen other than nasopharyngeal swab, presence of viral mutation(s) within the areas targeted by this assay, and inadequate number of viral copies(<138 copies/mL). A negative result must be combined with clinical observations, patient history, and epidemiological information. The expected result is Negative.  Fact Sheet for Patients:  EntrepreneurPulse.com.au  Fact Sheet for Healthcare Providers:  IncredibleEmployment.be  This test is no t yet approved or cleared by the [place] FDA and  has been authorized for detection and/or diagnosis of SARS-CoV-2 by FDA under an Emergency Use Authorization (EUA). This EUA will remain  in effect (meaning this test can be used) for the duration of the COVID-19 declaration under Section 564(b)(1) of the Act, 21 U.S.C.section 360bbb-3(b)(1), unless the authorization is terminated  or revoked sooner.       Influenza A by PCR NEGATIVE NEGATIVE   Influenza B by PCR NEGATIVE NEGATIVE    Comment: (NOTE) The Xpert Xpress  SARS-CoV-2/FLU/RSV plus assay is intended as an aid in the diagnosis of influenza from Nasopharyngeal swab specimens and should not be used as a sole basis for treatment. Nasal washings and aspirates are unacceptable for Xpert Xpress SARS-CoV-2/FLU/RSV testing.  Fact Sheet for Patients: EntrepreneurPulse.com.au  Fact Sheet for Healthcare Providers: IncredibleEmployment.be  This test is not yet approved or cleared by the [place] FDA and has been authorized for detection and/or diagnosis of SARS-CoV-2 by FDA under an Emergency Use Authorization (EUA). This EUA will remain in effect (meaning this test can be used) for the duration of the COVID-19 declaration under Section 564(b)(1) of the Act, 21 U.S.C. section 360bbb-3(b)(1), unless the authorization is terminated or revoked.  Performed at [facility], [place] [address], [place], [place] [address]   CBG monitoring, ED     Status: Abnormal   Collection Time: 05/02/21  7:59 AM  Result Value Ref Range   Glucose-Capillary 340 (H) 70 - 99 mg/dL    Comment: Glucose reference range applies only to samples taken after fasting for at least 8 hours.  Troponin I (High Sensitivity)     Status: Abnormal   Collection Time: 05/02/21  9:30 AM  Result Value Ref Range   Troponin I (High Sensitivity) 529 (HH) <18 ng/L    Comment: CRITICAL RESULT CALLED TO, READ BACK BY AND VERIFIED WITH: T.DELROSCO,RN 05/02/2021 AT 1030 A.HUGHES (NOTE) Elevated high sensitivity troponin I (hsTnI) values and significant  changes across serial measurements may suggest ACS but many other  chronic and acute conditions are known to elevate hsTnI results.  Refer to the Links section for chest pain algorithms and additional  guidance. Performed at [facility], [place] [address], [place], [place] [address]  DG Chest 2 View  Result Date: 05/02/2021 CLINICAL DATA:  Chest pain EXAM: CHEST - 2 VIEW COMPARISON:   04/21/2021 FINDINGS: Cardiomegaly and vascular pedicle widening. Low volume chest with greater elevation of the right diaphragm. Interstitial opacity with Kerley lines and fissure thickening. No pneumothorax. IMPRESSION: CHF. Electronically Signed   By: Jorje Guild M.D.   On: 05/02/2021 04:41    Pending Labs Unresulted Labs (From admission, onward)    None       Vitals/Pain Today's Vitals   05/02/21 0800 05/02/21 0900 05/02/21 1000 05/02/21 1045  BP: 136/85 135/84 123/69   Pulse: 95 72 65 67  Resp: '20 20 16 17  '$ Temp:      TempSrc:      SpO2: 96% 95% 96% 95%  Weight:      Height:      PainSc:        Isolation Precautions No active isolations  Medications Medications  acetaminophen (TYLENOL) tablet 650 mg (has no administration in time range)    Or  acetaminophen (TYLENOL) suppository 650 mg (has no administration in time range)  nitroGLYCERIN (NITROSTAT) SL tablet 0.4 mg (has no administration in time range)  furosemide (LASIX) injection 40 mg (40 mg Intravenous Given 05/02/21 0704)  aspirin tablet 325 mg (0 mg Oral Hold 05/02/21 0807)  insulin aspart (novoLOG) injection 0-9 Units (7 Units Subcutaneous Given 05/02/21 0805)  aspirin EC tablet 81 mg (has no administration in time range)  atorvastatin (LIPITOR) tablet 80 mg (has no administration in time range)  carvedilol (COREG) tablet 6.25 mg (6.25 mg Oral Given 05/02/21 0803)  clopidogrel (PLAVIX) tablet 75 mg (75 mg Oral Given 05/02/21 0929)  ezetimibe (ZETIA) tablet 10 mg (10 mg Oral Given 05/02/21 0929)  isosorbide mononitrate (IMDUR) 24 hr tablet 15 mg (15 mg Oral Given 05/02/21 0928)  levothyroxine (SYNTHROID) tablet 125 mcg (125 mcg Oral Given 05/02/21 0803)  potassium chloride SA (KLOR-CON M) CR tablet 40 mEq (40 mEq Oral Given 05/02/21 0658)  insulin aspart (novoLOG) injection 5 Units (5 Units Subcutaneous Given 05/02/21 0805)    Mobility walks Low fall risk   Focused Assessments Cardiac Assessment Handoff:     No results found for: CKTOTAL, CKMB, CKMBINDEX, TROPONINI No results found for: DDIMER Does the Patient currently have chest pain? No    R Recommendations: See Admitting Provider Note  Report given to:   Additional Notes: On 2 L [place]

## 2021-05-02 NOTE — Progress Notes (Signed)
ANTICOAGULATION CONSULT NOTE ? ?Pharmacy Consult for IV heparin ?Indication: chest pain/ACS ? ?Allergies  ?Allergen Reactions  ? Cedar Swelling  ?   ?  ? Other Swelling  ?  Teriyaki  ? ? ?Patient Measurements: ?Height: '5\' 1"'$  (154.9 cm) ?Weight: 85.7 kg (189 lb) ?IBW/kg (Calculated) : 47.8 ?Heparin Dosing Weight: 67.5 kg ? ?Vital Signs: ?Temp: 98.1 ?F (36.7 ?C) (03/12 2003) ?Temp Source: Oral (03/12 2003) ?BP: 122/70 (03/12 2003) ?Pulse Rate: 70 (03/12 2003) ? ?Labs: ?Recent Labs  ?  05/02/21 ?0420 05/02/21 ?1324 05/02/21 ?0930 05/02/21 ?2034  ?HGB 10.5*  --   --   --   ?HCT 32.7*  --   --   --   ?PLT 318  --   --   --   ?HEPARINUNFRC  --   --   --  0.16*  ?CREATININE 3.21*  --   --   --   ?TROPONINIHS 55* 147* 529*  --   ? ? ? ?Estimated Creatinine Clearance: 19.5 mL/min (A) (by C-G formula based on SCr of 3.21 mg/dL (H)). ? ? ?Medical History: ?Past Medical History:  ?Diagnosis Date  ? Cervical cancer ([place])   ? [place] and [place]; [place]  ? CHF (congestive heart failure) ([place]) 07-2014  ? Diabetes mellitus without complication ([place]) 4010  ? High cholesterol   ? Hypertension   ? MI (myocardial infarction) [facility])   ? 2004  ? Stroke [facility]) 09/2014, 2013  ? Vaginal Pap smear, abnormal   ? ? ?Assessment: ?23 YOF admitted for chest pain and undergoing ACS work-up. No history of anticoagulation PTA. Patient with known CAD, last cardiac cath on March 23, 2021 showed severe multivessel disease and occluded RCA stent and was not candidate for surgery. Pharmacy consulted to dose IV heparin. ? ?Initial heparin level subtherapeutic at 0.16, trops rising. ? ?Goal of Therapy:  ?Heparin level 0.3-0.7 units/ml ?Monitor platelets by anticoagulation protocol: Yes ?  ?Plan:  ?Heparin bolus 2000 units x1 ?Increase heparin to 1050 units/h ?Recheck heparin level in 8h ? ?Arrie Senate, PharmD, BCPS, BCCP ?Clinical Pharmacist ?[phone] ?Please check AMION for all [place] numbers ?05/02/2021 ? ? ? ?

## 2021-05-02 NOTE — Assessment & Plan Note (Signed)
Calculated BMI is 35.7  ?

## 2021-05-02 NOTE — ED Notes (Signed)
Howerter DO notified regarding pts troponin 147 ?

## 2021-05-02 NOTE — Hospital Course (Addendum)
Mrs. Whitfill was admitted to the hospital with the working diagnosis of NSEMI, complicated with acute heart failure decompensation   56 yo female with the past medical history of coronary artery disease, type 2 diabetes mellitus, heart failure, dyslipidemia, stage IV chronic kidney disease and obesity class 2, who presented with chest pain. She reported acute onset of chest pain, not exercise related, associated with dyspnea that resolved after one dose of nitroglycerin. On her initial physical examination her blood pressure was 148/64, HR 68, RR 24 and oxygen saturation 94% on room air. Lungs with no wheezing or rales, heart with S1 and S2 present and rhythmic with no gallops or rubs, no murmurs, abdomen soft and no lower extremity edema.   Na 134, K 3.7. Cl 108, bicarbonate 15, glucose 441, bun 35 and cr 3,21  BNP 1,132 High sensitive troponin 55, 147, 529  Wbc 12.0 hgb 10.5 hvt 32 and plt 318 Sars covid 19 negative   Chest radiograph with cardiomegaly, bilateral hilar vascular congestion and bilateral symmetric interstitial infiltrates, fluid at the right fissure. Small bilateral pleural effusions.   EKG 93 bpm, normal axis with normal intervals, sinus rhythm, with ST segment depression in I and AVL, negative T wave in lead II, III, and AVF, V4 to V6.  (Old changes)   Patient was placed on heparin infusion for anticoagulation and started diuresis with IV furosemide.   03/13 discontinue heparin drip. Continue diuresis with furosemide.

## 2021-05-02 NOTE — Assessment & Plan Note (Deleted)
#)   Essential Hypertension: documented h/o such, with outpatient antihypertensive regimen including Imdur, Coreg, Norvasc.  SBP's in the ED today: In the 140s.  We will continue home beta-blocker, Imdur, but hold home Norvasc in the context of presenting chest pain and elevated troponin as there is a relative contraindication for calcium channel blockers in the setting of ACS. ?  ?Plan: Close monitoring of subsequent BP via routine VS. hold home Norvasc.  Continue Imdur and ?  ?  ?

## 2021-05-02 NOTE — Assessment & Plan Note (Addendum)
High sensitive troponin trending up consistent with acute coronary syndrome.  Patient know to have coronary artery disease with last cardiac catheterization with coronary angiography on 01/13 with severe multivessel coronary artery disease, occluded RCA stent.  CT surgery has deemed patient not candidate for bypass surgery due to comorbid conditions.  Patient chest pain free this morning.   Continue medical therapy with carvedilol, isosorbide, asa and clopidogrel.   Holding on ace inh due to risk of worsening renal function.  Plan to discontinue heparin today, no clinical signs of ongoing ischemia (NSTEMI ruled in on admission, but seems to be resolved.).

## 2021-05-02 NOTE — Assessment & Plan Note (Addendum)
Fasting glucose is 154 this am. Continue glucose cover and monitoring with insulin sliding scale.  Patient is tolerating po well.

## 2021-05-02 NOTE — Assessment & Plan Note (Signed)
#)   Chest pain: Single episode of substernal chest pressure that was nonexertional, but resolved with sublingual nitroglycerin x1, without any residual chest discomfort.  This in the context of a known history of coronary disease, with most recent coronary angiography on 03/23/2021 showing evidence of severe multivessel disease, with plan for cardiology to discuss CT surgery.  Outcome of the discussions with CT surgery not entirely clear to me at this time.  Of note, initial troponin found to be 55, which is significantly lower than most recent prior value of 867 on 04/22/2021 at time of most recent NSTEMI hospitalization.  EKG shows no evidence of acute ischemic changes relative to most recent prior, as further detailed above.  On-call cardiology, Dr. Juanita Laster, with additional recommendations pending at this time. ?  ?Plan: Full dose aspirin x1.  Resume home high intensity atorvastatin.  Resume home beta-blocker.  Trend troponin.  Monitor on telemetry.  Potassium chloride 40 mEq p.o. x1 dose.  Add on serum magnesium level.  Cardiology consulted, with additional recommendations pending at this time, as above.  Resume home dual antiplatelet therapy. ?  ?

## 2021-05-03 DIAGNOSIS — I5023 Acute on chronic systolic (congestive) heart failure: Secondary | ICD-10-CM

## 2021-05-03 DIAGNOSIS — Z7902 Long term (current) use of antithrombotics/antiplatelets: Secondary | ICD-10-CM | POA: Diagnosis not present

## 2021-05-03 DIAGNOSIS — Z8249 Family history of ischemic heart disease and other diseases of the circulatory system: Secondary | ICD-10-CM | POA: Diagnosis not present

## 2021-05-03 DIAGNOSIS — I214 Non-ST elevation (NSTEMI) myocardial infarction: Secondary | ICD-10-CM | POA: Diagnosis present

## 2021-05-03 DIAGNOSIS — I429 Cardiomyopathy, unspecified: Secondary | ICD-10-CM | POA: Diagnosis present

## 2021-05-03 DIAGNOSIS — D631 Anemia in chronic kidney disease: Secondary | ICD-10-CM | POA: Diagnosis present

## 2021-05-03 DIAGNOSIS — Z8541 Personal history of malignant neoplasm of cervix uteri: Secondary | ICD-10-CM | POA: Diagnosis not present

## 2021-05-03 DIAGNOSIS — R0789 Other chest pain: Secondary | ICD-10-CM | POA: Diagnosis present

## 2021-05-03 DIAGNOSIS — Z6835 Body mass index (BMI) 35.0-35.9, adult: Secondary | ICD-10-CM | POA: Diagnosis not present

## 2021-05-03 DIAGNOSIS — E1122 Type 2 diabetes mellitus with diabetic chronic kidney disease: Secondary | ICD-10-CM | POA: Diagnosis present

## 2021-05-03 DIAGNOSIS — I252 Old myocardial infarction: Secondary | ICD-10-CM | POA: Diagnosis not present

## 2021-05-03 DIAGNOSIS — I10 Essential (primary) hypertension: Secondary | ICD-10-CM | POA: Diagnosis not present

## 2021-05-03 DIAGNOSIS — E039 Hypothyroidism, unspecified: Secondary | ICD-10-CM | POA: Diagnosis present

## 2021-05-03 DIAGNOSIS — Z79899 Other long term (current) drug therapy: Secondary | ICD-10-CM | POA: Diagnosis not present

## 2021-05-03 DIAGNOSIS — E876 Hypokalemia: Secondary | ICD-10-CM | POA: Diagnosis present

## 2021-05-03 DIAGNOSIS — I2510 Atherosclerotic heart disease of native coronary artery without angina pectoris: Secondary | ICD-10-CM | POA: Diagnosis present

## 2021-05-03 DIAGNOSIS — E782 Mixed hyperlipidemia: Secondary | ICD-10-CM | POA: Diagnosis present

## 2021-05-03 DIAGNOSIS — I5043 Acute on chronic combined systolic (congestive) and diastolic (congestive) heart failure: Secondary | ICD-10-CM | POA: Diagnosis present

## 2021-05-03 DIAGNOSIS — E872 Acidosis, unspecified: Secondary | ICD-10-CM | POA: Diagnosis present

## 2021-05-03 DIAGNOSIS — E1165 Type 2 diabetes mellitus with hyperglycemia: Secondary | ICD-10-CM | POA: Diagnosis present

## 2021-05-03 DIAGNOSIS — Z8616 Personal history of COVID-19: Secondary | ICD-10-CM | POA: Diagnosis not present

## 2021-05-03 DIAGNOSIS — I130 Hypertensive heart and chronic kidney disease with heart failure and stage 1 through stage 4 chronic kidney disease, or unspecified chronic kidney disease: Secondary | ICD-10-CM | POA: Diagnosis present

## 2021-05-03 DIAGNOSIS — E669 Obesity, unspecified: Secondary | ICD-10-CM | POA: Diagnosis present

## 2021-05-03 DIAGNOSIS — E871 Hypo-osmolality and hyponatremia: Secondary | ICD-10-CM | POA: Diagnosis present

## 2021-05-03 DIAGNOSIS — N184 Chronic kidney disease, stage 4 (severe): Secondary | ICD-10-CM | POA: Diagnosis present

## 2021-05-03 DIAGNOSIS — Z833 Family history of diabetes mellitus: Secondary | ICD-10-CM | POA: Diagnosis not present

## 2021-05-03 DIAGNOSIS — Z801 Family history of malignant neoplasm of trachea, bronchus and lung: Secondary | ICD-10-CM | POA: Diagnosis not present

## 2021-05-03 LAB — CBC
HCT: 31.2 % — ABNORMAL LOW (ref 36.0–46.0)
Hemoglobin: 9.8 g/dL — ABNORMAL LOW (ref 12.0–15.0)
MCH: 31.4 pg (ref 26.0–34.0)
MCHC: 31.4 g/dL (ref 30.0–36.0)
MCV: 100 fL (ref 80.0–100.0)
Platelets: 324 10*3/uL (ref 150–400)
RBC: 3.12 MIL/uL — ABNORMAL LOW (ref 3.87–5.11)
RDW: 14 % (ref 11.5–15.5)
WBC: 13.5 10*3/uL — ABNORMAL HIGH (ref 4.0–10.5)
nRBC: 0 % (ref 0.0–0.2)

## 2021-05-03 LAB — GLUCOSE, CAPILLARY
Glucose-Capillary: 194 mg/dL — ABNORMAL HIGH (ref 70–99)
Glucose-Capillary: 261 mg/dL — ABNORMAL HIGH (ref 70–99)
Glucose-Capillary: 157 mg/dL — ABNORMAL HIGH (ref 70–99)
Glucose-Capillary: 242 mg/dL — ABNORMAL HIGH (ref 70–99)

## 2021-05-03 LAB — BASIC METABOLIC PANEL
Anion gap: 13 (ref 5–15)
BUN: 34 mg/dL — ABNORMAL HIGH (ref 6–20)
CO2: 18 mmol/L — ABNORMAL LOW (ref 22–32)
Calcium: 9.1 mg/dL (ref 8.9–10.3)
Chloride: 107 mmol/L (ref 98–111)
Creatinine, Ser: 3.4 mg/dL — ABNORMAL HIGH (ref 0.44–1.00)
GFR, Estimated: 15 mL/min — ABNORMAL LOW (ref >60)
Glucose, Bld: 154 mg/dL — ABNORMAL HIGH (ref 70–99)
Potassium: 3.8 mmol/L (ref 3.5–5.1)
Sodium: 138 mmol/L (ref 135–145)

## 2021-05-03 LAB — HEPARIN LEVEL (UNFRACTIONATED): Heparin Unfractionated: 0.19 IU/mL — ABNORMAL LOW (ref 0.30–0.70)

## 2021-05-03 MED ORDER — ENOXAPARIN SODIUM 30 MG/0.3ML IJ SOSY
30.0000 mg | PREFILLED_SYRINGE | INTRAMUSCULAR | Status: DC
  Administered 2021-05-03 – 2021-05-04 (×2): 30 mg via SUBCUTANEOUS
  Filled 2021-05-03 (×2): qty 0.3

## 2021-05-03 MED ORDER — POTASSIUM CHLORIDE CRYS ER 20 MEQ PO TBCR
40.0000 meq | EXTENDED_RELEASE_TABLET | Freq: Once | ORAL | Status: AC
  Administered 2021-05-03: 40 meq via ORAL
  Filled 2021-05-03: qty 2

## 2021-05-03 MED ORDER — DICLOFENAC SODIUM 1 % EX GEL
2.0000 g | Freq: Four times a day (QID) | CUTANEOUS | Status: DC
  Administered 2021-05-03 – 2021-05-05 (×6): 2 g via TOPICAL
  Filled 2021-05-03: qty 100

## 2021-05-03 NOTE — Progress Notes (Signed)
?Progress Note ? ? ?Patient: Monica Carlson XKG:[id number] DOB: June 06, 1965 DOA: 05/02/2021     0 ?DOS: the patient was seen and examined on 05/03/2021 ?  ?Brief hospital course: ?Mrs. Monica Carlson was admitted to the hospital with the working diagnosis of NSEMI, complicated with acute heart failure decompensation  ? ?56 yo female with the past medical history of coronary artery disease, type 2 diabetes mellitus, heart failure, dyslipidemia, stage IV chronic kidney disease and obesity class 2, who presented with chest pain. She reported acute onset of chest pain, not exercise related, associated with dyspnea that resolved after one dose of nitroglycerin. On her initial physical examination her blood pressure was 148/64, HR 68, RR 24 and oxygen saturation 94% on room air. Lungs with no wheezing or rales, heart with S1 and S2 present and rhythmic with no gallops or rubs, no murmurs, abdomen soft and no lower extremity edema.  ? ?Na 134, K 3.7. Cl 108, bicarbonate 15, glucose 441, bun 35 and cr 3,21  ?BNP 1,132 ?High sensitive troponin 55, 147, 529  ?Wbc 12.0 hgb 10.5 hvt 32 and plt 318 ?Sars covid 19 negative  ? ?Chest radiograph with cardiomegaly, bilateral hilar vascular congestion and bilateral symmetric interstitial infiltrates, fluid at the right fissure. Small bilateral pleural effusions.  ? ?EKG 93 bpm, normal axis with normal intervals, sinus rhythm, with ST segment depression in I and AVL, negative T wave in lead II, III, and AVF, V4 to V6.  (Old changes)  ? ?Patient was placed on heparin infusion for anticoagulation and started diuresis with IV furosemide.  ? ?03/13 discontinue heparin drip. ?Continue diuresis with furosemide.  ? ? ? ? ? ?Assessment and Plan: ?* NSTEMI (non-ST elevated myocardial infarction) ([place]) ?High sensitive troponin trending up consistent with acute coronary syndrome. ? ?Patient know to have coronary artery disease with last cardiac catheterization with coronary angiography on 01/13 with severe  multivessel coronary artery disease, occluded RCA stent. ? ?CT surgery has deemed patient not candidate for bypass surgery due to comorbid conditions. ? ?Patient chest pain free this morning.  ? ?Continue medical therapy with carvedilol, isosorbide, asa and clopidogrel.  ? ?Holding on ace inh due to risk of worsening renal function. ? ?Plan to discontinue heparin today, no clinical signs of ongoing ischemia (NSTEMI ruled in on admission, but seems to be resolved.).  ? ? ?Acute on chronic combined systolic and diastolic CHF (congestive heart failure) ([place]) ?Echocardiogram from 02/2021 with a preserved LV systolic function with EF 40 to 45% with global hypokinesis. RV with preserved systolic function. No significant valvular disease.  ? ?Her volume status is improving ?Urine output over last 23 hrs is not documented.  ? ?Continue heart failure management with carvedilol.  ?No RAS inhibition due to risk of worsening renal function.  ? ?Continue diuresis with IV furosemide, possible transition to oral diuretic therapy in the next 24 hrs.  ? ?CKD (chronic kidney disease), stage IV ([place]) ?Hyponatremia. Non anion gap metabolic acidosis. ? ?Renal function with serum cr at 3,4 with K at 3,8 and serum bicarbonate at 18. ?Plan to continue diuresis with furosemide and follow up renal function with the am. ?Avoid hypotension and nephrotoxic medications.  ? ? ?Hypertension ?Blood pressure systolic has been 702 to 637 mmHg ?Continue carvedilol and diuresis ?Continue close blood pressure monitoring.  ? ?Hypothyroidism ?#) acquired hypothyroidism: documented h/o such, on Synthroid as outpatient.  ?  ?Plan: cont home Synthroid.  ?  ?  ?  ? ?Uncontrolled type 2 diabetes  mellitus with hyperglycemia ([place]) ?Fasting glucose is 154 this am. ?Continue glucose cover and monitoring with insulin sliding scale.  ?Patient is tolerating po well.  ? ?Class 2 obesity ?Calculated BMI is 35.7  ? ? ? ? ?  ? ?Subjective: patient is tolerating po well,  dyspnea has been improving, no chest pain. Left ankle pain  ? ? ?Physical Exam: ?Vitals:  ? 05/02/21 1713 05/02/21 2003 05/03/21 0453 05/03/21 1157  ?BP: 114/65 122/70 109/65 122/74  ?Pulse: 73 70 64 60  ?Resp:    16  ?Temp:  98.1 ?F (36.7 ?C) 97.9 ?F (36.6 ?C) 98 ?F (36.7 ?C)  ?TempSrc:  Oral Oral Oral  ?SpO2: 93% 97% 98% 98%  ?Weight:   84.4 kg   ?Height:      ? ?Neurology awake and alert ?ENT with no pallor ?Cardiovascular with S1 and S2 present and rhythmic with no gallops or murmurs no rubs ?No JVD ?No lower extremity edema  ?Respiratory with no wheezing, rales or rhonchi  ?Abdomen soft and not distended, protuberant  ?Data Reviewed: ? ? ? ?Family Communication: no family at the bedside  ? ?Disposition: ?Status is: Inpatient ?Remains inpatient appropriate because: heart failure management, possible dc home tomorrow  ? Planned Discharge Destination: Home ? ? ? ?Author: ?Tawni Millers, MD ?05/03/2021 3:51 PM ? ?For on call review [email or web address].  ?

## 2021-05-03 NOTE — Progress Notes (Signed)
Patient oxygen saturation decrease to 60-70% with good pleth on monitor, even after an intervention with [place] at 2L. Upon entry, patient is on 2L  and resting with eyes closed. Patient awaken and asked to take some breaths. Oxygen saturation is 96% now. Patient has a history of home oxygen but denies use. Patient states, "I felt like I didn't need it because when I check it, it is in the 90s".[place] MD notified. Awaiting advisement. ? ?

## 2021-05-03 NOTE — Progress Notes (Signed)
Pt's O2 saturations decreased to 62% while sleeping, 2LNC applied with improvement of 97% & sustaining. Will continue to monitor.  ? ?Elaina Hoops, RN ? ?

## 2021-05-03 NOTE — Progress Notes (Signed)
HOSPITAL MEDICINE OVERNIGHT EVENT NOTE   ? ?Notified by nursing that patient has been exhibiting bouts of hypoxia with sleep.  Patient is suspected to be developing his bouts of hypoxia with episodic apneas. ? ?Nursing particularly notes that when patient is aroused bouts of hypoxia completely resolves and patient denies any shortness of breath. ? ?Patient likely has underlying undiagnosed sleep apnea.  Nursing is already initiated supplemental oxygen which I agree with.  Patient will continue to be monitored and provision of sedating medications will be limited as able. ? ?Patient would benefit from outpatient polysomnogram at time of discharge. ? ?Vernelle Emerald  MD ?[facility]  ? ? ? ? ? ? ? ? ? ? ?

## 2021-05-03 NOTE — Progress Notes (Signed)
? ?Progress Note ? ?Patient Name: Monica Carlson ?Date of Encounter: 05/03/2021 ? ?[facility] Cardiologist: None  ? ?Subjective  ? ?Feeling better this morning.  States breathing is improved.  Denies any chest pain or pressure throughout her hospitalization. ? ?Inpatient Medications  ?  ?Scheduled Meds: ? aspirin EC  81 mg Oral Daily  ? aspirin  325 mg Oral Once  ? atorvastatin  80 mg Oral QHS  ? carvedilol  6.25 mg Oral BID WC  ? clopidogrel  75 mg Oral Daily  ? ezetimibe  10 mg Oral Daily  ? furosemide  40 mg Intravenous BID  ? insulin aspart  0-9 Units Subcutaneous TID WC  ? isosorbide mononitrate  15 mg Oral Daily  ? levothyroxine  125 mcg Oral QAC breakfast  ? ?Continuous Infusions: ? heparin 1,050 Units/hr (05/03/21 0600)  ? ?PRN Meds: ?acetaminophen **OR** acetaminophen, nitroGLYCERIN  ? ?Vital Signs  ?  ?Vitals:  ? 05/02/21 1613 05/02/21 1713 05/02/21 2003 05/03/21 0453  ?BP: 137/83 114/65 122/70 109/65  ?Pulse:  73 70 64  ?Resp: 16     ?Temp: 99.2 ?F (37.3 ?C)  98.1 ?F (36.7 ?C) 97.9 ?F (36.6 ?C)  ?TempSrc: Oral  Oral Oral  ?SpO2: 93% 93% 97% 98%  ?Weight:    84.4 kg  ?Height:      ? ? ?Intake/Output Summary (Last 24 hours) at 05/03/2021 0815 ?Last data filed at 05/03/2021 0600 ?Gross per 24 hour  ?Intake 344.01 ml  ?Output --  ?Net 344.01 ml  ? ?Last 3 Weights 05/03/2021 05/02/2021 04/23/2021  ?Weight (lbs) 186 lb 1.1 oz 189 lb 185 lb 3 oz  ?Weight (kg) 84.4 kg 85.73 kg 84 kg  ?   ? ?Telemetry  ?  ?Sinus rhythm, occasional PVCs- Personally Reviewed ? ?ECG  ?  ?EKG from 05/02/2021 reviewed, demonstrating normal sinus rhythm with nonspecific IVCD, ST/T wave abnormality consider inferolateral ischemia - Personally Reviewed ? ?Physical Exam  ?Alert, oriented, obese woman in no distress ?GEN: No acute distress.   ?Neck: No JVD ?Cardiac: RRR, no murmurs, rubs, or gallops.  ?Respiratory: Diminished in the bases bilaterally, otherwise clear. ?GI: Soft, nontender, non-distended  ?MS: No edema; No deformity. ?Neuro:   Nonfocal  ?Psych: Normal affect  ? ?Labs  ?  ?High Sensitivity Troponin:   ?Recent Labs  ?Lab 04/21/21 ?0616 04/22/21 ?6948 05/02/21 ?0420 05/02/21 ?5462 05/02/21 ?0930  ?TROPONINIHS 845* 867* 55* 147* 529*  ?   ?Chemistry ?Recent Labs  ?Lab 05/02/21 ?0420 05/02/21 ?7035  ?NA 134*  --   ?K 3.7  --   ?CL 108  --   ?CO2 15*  --   ?GLUCOSE 441*  --   ?BUN 35*  --   ?CREATININE 3.21*  --   ?CALCIUM 8.8*  --   ?MG  --  2.2  ?GFRNONAA 16*  --   ?ANIONGAP 11  --   ?  ?Lipids No results for input(s): CHOL, TRIG, HDL, LABVLDL, LDLCALC, CHOLHDL in the last 168 hours.  ?Hematology ?Recent Labs  ?Lab 05/02/21 ?0420 05/03/21 ?0014  ?WBC 12.0* 13.5*  ?RBC 3.28* 3.12*  ?HGB 10.5* 9.8*  ?HCT 32.7* 31.2*  ?MCV 99.7 100.0  ?MCH 32.0 31.4  ?MCHC 32.1 31.4  ?RDW 14.2 14.0  ?PLT 318 324  ? ?Thyroid No results for input(s): TSH, FREET4 in the last 168 hours.  ?BNP ?Recent Labs  ?Lab 05/02/21 ?0420  ?BNP 1,132.0*  ?  ?DDimer No results for input(s): DDIMER in the last 168 hours.  ? ?  Radiology  ?  ?DG Chest 2 View ? ?Result Date: 05/02/2021 ?CLINICAL DATA:  Chest pain EXAM: CHEST - 2 VIEW COMPARISON:  04/21/2021 FINDINGS: Cardiomegaly and vascular pedicle widening. Low volume chest with greater elevation of the right diaphragm. Interstitial opacity with Kerley lines and fissure thickening. No pneumothorax. IMPRESSION: CHF. Electronically Signed   By: Jorje Guild M.D.   On: 05/02/2021 04:41   ? ?Cardiac Studies  ? ?Echocardiogram 03/20/2021: ?IMPRESSIONS  ? ? ? 1. Left ventricular ejection fraction, by estimation, is 40 to 45%. The  ?left ventricle has mildly decreased function. The left ventricle  ?demonstrates global hypokinesis. Left ventricular diastolic parameters are  ?consistent with Grade I diastolic  ?dysfunction (impaired relaxation). Elevated left atrial pressure.  ? 2. Right ventricular systolic function is normal. The right ventricular  ?size is normal. Tricuspid regurgitation signal is inadequate for assessing  ?PA pressure.   ? 3. The mitral valve is abnormal. Mild mitral valve regurgitation. No  ?evidence of mitral stenosis.  ? 4. The aortic valve is tricuspid. There is moderate calcification of the  ?aortic valve. There is moderate thickening of the aortic valve. Aortic  ?valve regurgitation is not visualized. No aortic stenosis is present.  ? ?Images personally reviewed ? ?Cardiac catheterization 03/23/2021: ?  Mid RCA lesion is 100% stenosed.  Left to right collaterals. ?  2nd Mrg lesion is 90% stenosed. ?  Ost LAD to Mid LAD lesion is 75% stenosed. ?  1st Diag lesion is 75% stenosed. ?  LV end diastolic pressure is mildly elevated. ?  There is no aortic valve stenosis. ?  ?Severe, multivessel disease.  Diffuse disease throughout the left system.  Occluded mid RCA stent.  Large right PDA and distal RCA system which fills by collaterals from the left.  Restart heparin in 8 hours.  Discussed with Dr. [place].  Will obtain CVTS consult.  Contrast minimize due to renal dysfunction. ?  ?Message left for daughter, Karn Pickler. ? ?Patient Profile  ?   ?56 y.o. female with longstanding type 2 diabetes, stage IV chronic kidney disease, and severe multivessel coronary artery disease, presenting with symptoms consistent with acute on chronic systolic heart failure ? ?Assessment & Plan  ?  ?1.  Acute on chronic systolic heart failure: Continue IV furosemide as patient appears to be clinically improved.  Need to document I/os.  Continue to follow creatinine closely.  Stable for now.  GDMT is limited in the context of her stage IV kidney disease.  Not a candidate for ACE/ARB/ARNI/aldosterone antagonism.  Appears to be well beta blocked.  Continue carvedilol and low-dose isosorbide.  Anticipate changing to oral torsemide tomorrow. ?2.  Elevated troponin: Suspect demand ischemia.  No anginal symptoms noted.  I reviewed her cardiac catheterization films.  She has severe diffuse disease of the left coronary artery with incredibly poor targets.  The right  coronary artery is occluded with left-to-right collaterals.  Considering her comorbidities and very poor targets.  In my opinion she is not currently nor will she ever be a candidate for CABG.  She is not a candidate for PCI.  Medical therapy is her only treatment option.  Fortunately she is not having angina. ? ?Recommendations for today: Continue IV diuresis, document I's/O's and daily weights, anticipate change to oral torsemide tomorrow.  We stopped IV heparin as she does not appear to have ACS.  Potassium repleted. ?   ? ?For questions or updates, please contact [place] ?Please consult [email or web address] for contact info under  ? ?  ?   ?  Signed, ?Sherren Mocha, MD  ?05/03/2021, 8:15 AM   ? ?

## 2021-05-03 NOTE — Progress Notes (Addendum)
Heart Failure Navigator Progress Note ? ?Assessed for Heart & Vascular TOC clinic readiness.  ?Patient does not meet criteria due to patient baseline SCr appears to be low 3's, unable to further optimize GDMT. Patient would not benefit from [facility] clinic.  ? ?Navigator available for Educational resources.  ? ?Earnestine Leys, BSN, RN ?Heart Failure Nurse Navigator ?[phone]  ?

## 2021-05-03 NOTE — Assessment & Plan Note (Signed)
Blood pressure systolic has been 818 to 590 mmHg Continue carvedilol and diuresis Continue close blood pressure monitoring.

## 2021-05-03 NOTE — Progress Notes (Signed)
ANTICOAGULATION CONSULT NOTE ? ?Pharmacy Consult for IV heparin ?Indication: chest pain/ACS ? ?Allergies  ?Allergen Reactions  ? Cedar Swelling  ?   ?  ? Other Swelling  ?  Teriyaki  ? ? ?Patient Measurements: ?Height: '5\' 1"'$  (154.9 cm) ?Weight: 84.4 kg (186 lb 1.1 oz) ?IBW/kg (Calculated) : 47.8 ?Heparin Dosing Weight: 67.5 kg ? ?Vital Signs: ?Temp: 97.9 ?F (36.6 ?C) (03/13 0453) ?Temp Source: Oral (03/13 0453) ?BP: 109/65 (03/13 0453) ?Pulse Rate: 64 (03/13 0453) ? ?Labs: ?Recent Labs  ?  05/02/21 ?0420 05/02/21 ?9509 05/02/21 ?0930 05/02/21 ?2034 05/03/21 ?3267 05/03/21 ?1245  ?HGB 10.5*  --   --   --  9.8*  --   ?HCT 32.7*  --   --   --  31.2*  --   ?PLT 318  --   --   --  324  --   ?HEPARINUNFRC  --   --   --  0.16*  --  0.19*  ?CREATININE 3.21*  --   --   --   --   --   ?TROPONINIHS 55* 147* 529*  --   --   --   ? ? ? ?Estimated Creatinine Clearance: 19.3 mL/min (A) (by C-G formula based on SCr of 3.21 mg/dL (H)). ? ? ?Medical History: ?Past Medical History:  ?Diagnosis Date  ? Cervical cancer ([place])   ? cryotherapy and colposcopy/[place]; 1991  ? CHF (congestive heart failure) ([place]) 07-2014  ? Diabetes mellitus without complication ([place]) 8099  ? High cholesterol   ? Hypertension   ? MI (myocardial infarction) [facility])   ? 2004  ? Stroke [facility]) 09/2014, 2013  ? Vaginal Pap smear, abnormal   ? ? ?Assessment: ?39 YOF admitted for chest pain and undergoing ACS work-up. No history of anticoagulation PTA. Patient with known CAD, last cardiac cath on March 23, 2021 showed severe multivessel disease and occluded RCA stent and was not candidate for surgery. Pharmacy consulted to dose IV heparin. ? ?Heparin level this AM remains below goal.  No overt bleeding or complications noted. ? ?Goal of Therapy:  ?Heparin level 0.3-0.7 units/ml ?Monitor platelets by anticoagulation protocol: Yes ?  ?Plan:  ?Discussed with Dr. Burt Knack.  No plans for further ACS workup. ?Stop IV heparin - will add lovenox for DVT  prophylaxis. ? ?[place] Crane, Pharm D, BCPS, BCCP ?Clinical Pharmacist ? 05/03/2021 8:55 AM  ? ?[facility] pharmacy phone numbers are listed on [email or web address] ? ? ? ? ?

## 2021-05-04 LAB — BASIC METABOLIC PANEL
Anion gap: 8 (ref 5–15)
BUN: 37 mg/dL — ABNORMAL HIGH (ref 6–20)
CO2: 19 mmol/L — ABNORMAL LOW (ref 22–32)
Calcium: 8.8 mg/dL — ABNORMAL LOW (ref 8.9–10.3)
Chloride: 109 mmol/L (ref 98–111)
Creatinine, Ser: 3.58 mg/dL — ABNORMAL HIGH (ref 0.44–1.00)
GFR, Estimated: 14 mL/min — ABNORMAL LOW (ref >60)
Glucose, Bld: 165 mg/dL — ABNORMAL HIGH (ref 70–99)
Potassium: 3.8 mmol/L (ref 3.5–5.1)
Sodium: 136 mmol/L (ref 135–145)

## 2021-05-04 LAB — GLUCOSE, CAPILLARY
Glucose-Capillary: 158 mg/dL — ABNORMAL HIGH (ref 70–99)
Glucose-Capillary: 181 mg/dL — ABNORMAL HIGH (ref 70–99)
Glucose-Capillary: 179 mg/dL — ABNORMAL HIGH (ref 70–99)
Glucose-Capillary: 246 mg/dL — ABNORMAL HIGH (ref 70–99)

## 2021-05-04 MED ORDER — TORSEMIDE 20 MG PO TABS: 20.0000 mg | ORAL_TABLET | Freq: Every day | ORAL | Status: DC

## 2021-05-04 MED ORDER — TORSEMIDE 20 MG PO TABS
40.0000 mg | ORAL_TABLET | Freq: Every day | ORAL | Status: DC
  Administered 2021-05-05: 40 mg via ORAL
  Filled 2021-05-04 (×2): qty 2

## 2021-05-04 NOTE — Progress Notes (Addendum)
? ?Progress Note ? ?Patient Name: Monica Carlson ?Date of Encounter: 05/04/2021 ? ?[facility] Cardiologist: None  ? ?Subjective  ? ?No complaints this morning. Sitting up in chair.  ? ?Inpatient Medications  ?  ?Scheduled Meds: ? aspirin EC  81 mg Oral Daily  ? atorvastatin  80 mg Oral QHS  ? carvedilol  6.25 mg Oral BID WC  ? clopidogrel  75 mg Oral Daily  ? diclofenac Sodium  2 g Topical QID  ? enoxaparin (LOVENOX) injection  30 mg Subcutaneous Q24H  ? ezetimibe  10 mg Oral Daily  ? furosemide  40 mg Intravenous BID  ? insulin aspart  0-9 Units Subcutaneous TID WC  ? isosorbide mononitrate  15 mg Oral Daily  ? levothyroxine  125 mcg Oral QAC breakfast  ? ?Continuous Infusions: ? ?PRN Meds: ?acetaminophen **OR** acetaminophen, nitroGLYCERIN  ? ?Vital Signs  ?  ?Vitals:  ? 05/03/21 0453 05/03/21 1157 05/03/21 2037 05/04/21 0539  ?BP: 109/65 122/74 109/60 125/78  ?Pulse: 64 60 70   ?Resp:  16    ?Temp: 97.9 ?F (36.6 ?C) 98 ?F (36.7 ?C) 98.1 ?F (36.7 ?C) 97.9 ?F (36.6 ?C)  ?TempSrc: Oral Oral Oral Oral  ?SpO2: 98% 98% 95%   ?Weight: 84.4 kg   83.5 kg  ?Height:      ? ? ?Intake/Output Summary (Last 24 hours) at 05/04/2021 0904 ?Last data filed at 05/04/2021 0247 ?Gross per 24 hour  ?Intake --  ?Output 800 ml  ?Net -800 ml  ? ?Last 3 Weights 05/04/2021 05/03/2021 05/02/2021  ?Weight (lbs) 184 lb 1.6 oz 186 lb 1.1 oz 189 lb  ?Weight (kg) 83.507 kg 84.4 kg 85.73 kg  ?   ? ?Telemetry  ?  ?SR - Personally Reviewed ? ?ECG  ? ?No new tracing ? ?Physical Exam  ? ?GEN: No acute distress.   ?Neck: No JVD ?Cardiac: RRR, no murmurs, rubs, or gallops.  ?Respiratory: Clear to auscultation bilaterally. ?GI: Soft, nontender, non-distended  ?MS: No edema; No deformity. ?Neuro:  Nonfocal  ?Psych: Normal affect  ? ?Labs  ?  ?High Sensitivity Troponin:   ?Recent Labs  ?Lab 04/21/21 ?0616 04/22/21 ?1093 05/02/21 ?0420 05/02/21 ?2355 05/02/21 ?0930  ?TROPONINIHS 845* 867* 55* 147* 529*  ?   ?Chemistry ?Recent Labs  ?Lab 05/02/21 ?0420  05/02/21 ?7322 05/03/21 ?0014 05/04/21 ?0254  ?NA 134*  --  138 136  ?K 3.7  --  3.8 3.8  ?CL 108  --  107 109  ?CO2 15*  --  18* 19*  ?GLUCOSE 441*  --  154* 165*  ?BUN 35*  --  34* 37*  ?CREATININE 3.21*  --  3.40* 3.58*  ?CALCIUM 8.8*  --  9.1 8.8*  ?MG  --  2.2  --   --   ?GFRNONAA 16*  --  15* 14*  ?ANIONGAP 11  --  13 8  ?  ?Lipids No results for input(s): CHOL, TRIG, HDL, LABVLDL, LDLCALC, CHOLHDL in the last 168 hours.  ?Hematology ?Recent Labs  ?Lab 05/02/21 ?0420 05/03/21 ?0014  ?WBC 12.0* 13.5*  ?RBC 3.28* 3.12*  ?HGB 10.5* 9.8*  ?HCT 32.7* 31.2*  ?MCV 99.7 100.0  ?MCH 32.0 31.4  ?MCHC 32.1 31.4  ?RDW 14.2 14.0  ?PLT 318 324  ? ?Thyroid No results for input(s): TSH, FREET4 in the last 168 hours.  ?BNP ?Recent Labs  ?Lab 05/02/21 ?0420  ?BNP 1,132.0*  ?  ?DDimer No results for input(s): DDIMER in the last 168 hours.  ? ?  Radiology  ?  ?No results found. ? ?Cardiac Studies  ? ?Echocardiogram 03/20/2021: ?IMPRESSIONS  ? ? ? 1. Left ventricular ejection fraction, by estimation, is 40 to 45%. The  ?left ventricle has mildly decreased function. The left ventricle  ?demonstrates global hypokinesis. Left ventricular diastolic parameters are  ?consistent with Grade I diastolic  ?dysfunction (impaired relaxation). Elevated left atrial pressure.  ? 2. Right ventricular systolic function is normal. The right ventricular  ?size is normal. Tricuspid regurgitation signal is inadequate for assessing  ?PA pressure.  ? 3. The mitral valve is abnormal. Mild mitral valve regurgitation. No  ?evidence of mitral stenosis.  ? 4. The aortic valve is tricuspid. There is moderate calcification of the  ?aortic valve. There is moderate thickening of the aortic valve. Aortic  ?valve regurgitation is not visualized. No aortic stenosis is present.  ?  ?Images personally reviewed ?  ?Cardiac catheterization 03/23/2021: ?  Mid RCA lesion is 100% stenosed.  Left to right collaterals. ?  2nd Mrg lesion is 90% stenosed. ?  Ost LAD to Mid LAD  lesion is 75% stenosed. ?  1st Diag lesion is 75% stenosed. ?  LV end diastolic pressure is mildly elevated. ?  There is no aortic valve stenosis. ?  ?Severe, multivessel disease.  Diffuse disease throughout the left system.  Occluded mid RCA stent.  Large right PDA and distal RCA system which fills by collaterals from the left.   ? ?Diagnostic ?Dominance: Right ? ? ?Patient Profile  ?   ?56 y.o. female with longstanding type 2 diabetes, stage IV chronic kidney disease, and severe multivessel coronary artery disease, presenting with symptoms consistent with acute on chronic systolic heart failure. ? ?Assessment & Plan  ?  ?HFrEF/ICM: BNP 1132 on admission, CXR with edema. Has been diuresing with IV lasix '40mg'$  BID, though suspect her I&Os are not accurate. Weight is down 2lbs overnight. Was able to ambulate in the hallway without breathing issues. Currently on RA with stable sats.  ?-- will transition to torsemide '40mg'$  daily ?-- continue coreg 6.'25mg'$  BID, Imdur '15mg'$  daily  ?-- no room for additional GDMT with renal disease ? ?Elevated Troponin: hsTn 147>>529. No anginal symptoms. Recent cardiac cath back 02/2021 and not felt to be a candidate for CABG or PCI. Medical therapy only option. She did ambulate without chest pain.  ?-- continue ASA, Lipitor '80mg'$  daily, coreg 6.'25mg'$  BID, plavix '75mg'$  daily, Zetia '10mg'$  daily, Imdur '15mg'$  daily  ? ?HLD: on high dose statin and Zetia ? ?Stage IV CKD: Cr baseline around 3.2-3.5 ?-- BMET in am ? ?HTN: stable ?-- continue coreg 6.'25mg'$  BID and Imdur '15mg'$  daily  ? ?OSA?: documented drop in O2 sats while sleeping requiring O2 ?--would benefit from outpatient sleep study ? ?For questions or updates, please contact [place] ?Please consult [email or web address] for contact info under  ? ?Signed, ?Monica Bellis, NP  ?05/04/2021, 9:04 AM   ? ?Patient seen, examined. Available data reviewed. Agree with findings, assessment, and plan as outlined by Monica Bellis, NP.  On my exam, the patient  is sitting up in a chair at the bedside.  She is alert, oriented, in no distress.  JVP is normal, lungs are diminished in the bases but otherwise clear, heart is regular rate and rhythm no murmur gallop, abdomen soft nontender, extremities have no edema.  As above, will transition the patient to oral torsemide at a dose of 40 mg daily in light of her stage IV kidney disease.  Ins and outs  do not appear to be accurately recorded, but she is clinically improved.  We will continue the remainder of her medical regimen which includes aspirin, clopidogrel, isosorbide, and carvedilol.  As per yesterday's note, she is not a candidate for renin angiotensin inhibition or aldosterone antagonist because of her advanced kidney disease.  Would anticipate medical stability for discharge over the next 24 hours. ? ?Monica Carlson, M.D. ?05/04/2021 ?10:19 AM ? ? ?

## 2021-05-04 NOTE — Progress Notes (Signed)
?Progress Note ? ? ?Patient: Monica Carlson TWS:[id number] DOB: [birth year] DOA: 05/02/2021     1 ?DOS: the patient was seen and examined on 05/04/2021 ?  ?Brief hospital course: ?Monica Carlson was admitted to the hospital with the working diagnosis of NSEMI, complicated with acute heart failure decompensation  ? ?56 yo female with the past medical history of coronary artery disease, type 2 diabetes mellitus, heart failure, dyslipidemia, stage IV chronic kidney disease and obesity class 2, who presented with chest pain. She reported acute onset of chest pain, not exercise related, associated with dyspnea that resolved after one dose of nitroglycerin. On her initial physical examination her blood pressure was 148/64, HR 68, RR 24 and oxygen saturation 94% on room air. Lungs with no wheezing or rales, heart with S1 and S2 present and rhythmic with no gallops or rubs, no murmurs, abdomen soft and no lower extremity edema.  ? ?Na 134, K 3.7. Cl 108, bicarbonate 15, glucose 441, bun 35 and cr 3,21  ?BNP 1,132 ?High sensitive troponin 55, 147, 529  ?Wbc 12.0 hgb 10.5 hvt 32 and plt 318 ?Sars covid 19 negative  ? ?Chest radiograph with cardiomegaly, bilateral hilar vascular congestion and bilateral symmetric interstitial infiltrates, fluid at the right fissure. Small bilateral pleural effusions.  ? ?EKG 93 bpm, normal axis with normal intervals, sinus rhythm, with ST segment depression in I and AVL, negative T wave in lead II, III, and AVF, V4 to V6.  (Old changes)  ? ?Patient was placed on heparin infusion for anticoagulation and started diuresis with IV furosemide.  ? ?03/13 discontinue heparin drip. ?Continue diuresis with furosemide.  ? ?Possible discharge home on 03/15  ? ? ? ? ? ?Assessment and Plan: ?* NSTEMI (non-ST elevated myocardial infarction) ([place]) ?High sensitive troponin trending up consistent with acute coronary syndrome. ? ?Patient know to have coronary artery disease with last cardiac catheterization with  coronary angiography on 01/13 with severe multivessel coronary artery disease, occluded RCA stent. ? ?CT surgery has deemed patient not candidate for bypass surgery due to comorbid conditions. ? ?03/13 discontinue heparin, no clinical signs of ongoing ischemia (NSTEMI ruled in on admission, but seems to be resolved.).  ? ?Continue with aggressive medical therapy with atorvastatin, aspirin, clopidogrel, and ezetimibe.  ?Continue with isosorbide.  ? ? ?Acute on chronic combined systolic and diastolic CHF (congestive heart failure) ([place]) ?Echocardiogram from 02/2021 with a preserved LV systolic function with EF 40 to 45% with global hypokinesis. RV with preserved systolic function. No significant valvular disease.  ? ?Improved volume status. ?Urine output documented 800 cc (possible not accurate).  ? ?Heart failure management with carvedilol.  ?No RAS inhibition due to risk of worsening renal function.  ?Plan to transition to oral torsemide tomorrow.   ? ?CKD (chronic kidney disease), stage IV ([place]) ?Hyponatremia. Hypokalemia. Non anion gap metabolic acidosis. ? ?His baseline cr is around 3,3. Today renal function with serum cr at 3,58 with K at 3,8 and serum bicarbonate at 19  ? ?Plan to continue diuresis with torsemide. ?Follow up renal function in am, avoid hypotension and nephrotoxic medications.  ? ?Hypertension ?Continue blood pressure control with carvedilol.  ?Systolic blood pressure has been 109 to 130 range.  ? ?Hypothyroidism ?#) acquired hypothyroidism: documented h/o such, on Synthroid as outpatient.  ?  ?Plan: cont home Synthroid.  ?  ?  ?  ? ?Uncontrolled type 2 diabetes mellitus with hyperglycemia ([place]) ?Fasting glucose is 165 this am. ?Continue glucose cover and monitoring with insulin  sliding scale.  ?Patient is tolerating po well.  ? ?Class 2 obesity ?Calculated BMI is 35.7  ? ? ? ? ?  ? ?Subjective: Patient clinically improving dyspnea, no chest pain and no lower extremity edema  ? ?Physical  Exam: ?Vitals:  ? 05/03/21 1157 05/03/21 2037 05/04/21 0539 05/04/21 0941  ?BP: 122/74 109/60 125/78   ?Pulse: 60 70  70  ?Resp: 16     ?Temp: 98 ?F (36.7 ?C) 98.1 ?F (36.7 ?C) 97.9 ?F (36.6 ?C)   ?TempSrc: Oral Oral Oral   ?SpO2: 98% 95%    ?Weight:   83.5 kg   ?Height:      ? ?Neurology awake and alert ?ENT with mild pallor ?Cardiovascular with S1 and S2 present and rhythmic with no gallops or murmurs ?No JVD ?No lower extremity edema ?Respiratory with scattered rales ?Abdomen soft and non tender  ?Data Reviewed: ? ? ? ?Family Communication: no family at the bedside  ? ?Disposition: ?Status is: Inpatient ?Remains inpatient appropriate because: heart failure, possible dc in the next 24 hrs.  ? Planned Discharge Destination: Home ? ?Author: ?Tawni Millers, MD ?05/04/2021 1:39 PM ? ?For on call review [email or web address].  ?

## 2021-05-04 NOTE — Evaluation (Addendum)
Physical Therapy Evaluation/ Discharge ?Patient Details ?Name: Monica Carlson ?MRN: [id number] ?DOB: March 16, 1965 ?Today's Date: 05/04/2021 ? ?History of Present Illness ? 56 yo admitted 3/12 with chest pain with NSTEMI and acute heart failure decompensation. PMHx: CAD, DM, heart failure, dyslipidemia, CKD, obesity  ?Clinical Impression ? Pt very pleasant and reports no vision left eye with PCA at home 5 days a week for assist of homemaking and  driving. Pt educated for heart healthy diet and walking program and will defer further needs to CRPI. Pt able to perform basic pericare and donning socks without assist and reports baseline function with mobility. Encouraged continued ambulation to bathroom and hall acutely. No further acute needs with pt aware and agreeable.  ? ?HR 60-70 ?SPO2 96% RA ?BP 135/88 ?   ? ?Recommendations for follow up therapy are one component of a multi-disciplinary discharge planning process, led by the attending physician.  Recommendations may be updated based on patient status, additional functional criteria and insurance authorization. ? ?Follow Up Recommendations No PT follow up ? ?  ?Assistance Recommended at Discharge PRN  ?Patient can return home with the following ? Assist for transportation;Assistance with cooking/housework ? ?  ?Equipment Recommendations None recommended by PT  ?Recommendations for Other Services ?    ?  ?Functional Status Assessment Patient has not had a recent decline in their functional status  ? ?  ?Precautions / Restrictions Precautions ?Precautions: Fall  ? ?  ? ?Mobility ? Bed Mobility ?Overal bed mobility: Modified Independent ?  ?  ?  ?  ?  ?  ?  ?  ? ?Transfers ?Overall transfer level: Modified independent ?  ?  ?  ?  ?  ?  ?  ?  ?  ?  ? ?Ambulation/Gait ?Ambulation/Gait assistance: Modified independent (Device/Increase time) ?Gait Distance (Feet): 350 Feet ?Assistive device: Rolling walker (2 wheels), Straight cane ?Gait Pattern/deviations: Step-through  pattern, Decreased stride length ?  ?Gait velocity interpretation: >2.62 ft/sec, indicative of community ambulatory ?  ?General Gait Details: pt walks with cane in community and furniture walks in house. She walked majority of gait with cane but continued reaching for environmental with other hand. Used RW for last 25' with improved control and stability with encouragement to continue RW use at home ? ?Stairs ?  ?  ?  ?  ?  ? ?Wheelchair Mobility ?  ? ?Modified Rankin (Stroke Patients Only) ?  ? ?  ? ?Balance Overall balance assessment: Mild deficits observed, not formally tested ?  ?  ?  ?  ?  ?  ?  ?  ?  ?  ?  ?  ?  ?  ?  ?  ?  ?  ?   ? ? ? ?Pertinent Vitals/Pain Pain Assessment ?Pain Assessment: No/denies pain  ? ? ?Home Living Family/patient expects to be discharged to:: Private residence ?Living Arrangements: Alone ?Available Help at Discharge: Personal care attendant ?Type of Home: Apartment ?Home Access: Level entry ?  ?  ?  ?Home Layout: One level ?Home Equipment: Conservation officer, nature (2 wheels);Cane - single point ?   ?  ?Prior Function Prior Level of Function : Needs assist ?  ?  ?  ?  ?  ?  ?Mobility Comments: walks with cane ?ADLs Comments: aide 5 days a week for 6 hrs to assist with homemaking, pt doesn't drive PCA usually transports ?  ? ? ?Hand Dominance  ?   ? ?  ?Extremity/Trunk Assessment  ? Upper Extremity Assessment ?  Upper Extremity Assessment: Overall WFL for tasks assessed ?  ? ?Lower Extremity Assessment ?Lower Extremity Assessment: Overall WFL for tasks assessed ?  ? ?Cervical / Trunk Assessment ?Cervical / Trunk Assessment: Kyphotic  ?Communication  ?    ?Cognition Arousal/Alertness: Awake/alert ?Behavior During Therapy: [employer] for tasks assessed/performed ?Overall Cognitive Status: Within Functional Limits for tasks assessed ?  ?  ?  ?  ?  ?  ?  ?  ?  ?  ?  ?  ?  ?  ?  ?  ?  ?  ?  ? ?  ?General Comments   ? ?  ?Exercises    ? ?Assessment/Plan  ?  ?PT Assessment Patient does not need any further PT  services  ?PT Problem List   ? ?   ?  ?PT Treatment Interventions     ? ?PT Goals (Current goals can be found in the Care Plan section)  ?Acute Rehab PT Goals ?PT Goal Formulation: All assessment and education complete, DC therapy ? ?  ?Frequency   ?  ? ? ?Co-evaluation   ?  ?  ?  ?  ? ? ?  ?AM-PAC PT "6 Clicks" Mobility  ?Outcome Measure Help needed turning from your back to your side while in a flat bed without using bedrails?: None ?Help needed moving from lying on your back to sitting on the side of a flat bed without using bedrails?: None ?Help needed moving to and from a bed to a chair (including a wheelchair)?: None ?Help needed standing up from a chair using your arms (e.g., wheelchair or bedside chair)?: None ?Help needed to walk in hospital room?: A Little ?Help needed climbing 3-5 steps with a railing? : A Little ?6 Click Score: 22 ? ?  ?End of Session   ?Activity Tolerance: Patient tolerated treatment well ?Patient left: in chair;with call bell/phone within reach ?Nurse Communication: Mobility status ?PT Visit Diagnosis: Other abnormalities of gait and mobility (R26.89) ?  ? ?Time: 9518-8416 ?PT Time Calculation (min) (ACUTE ONLY): 28 min ? ? ?Charges:   PT Evaluation ?$PT Eval Moderate Complexity: 1 Mod ?PT Treatments ?$Gait Training: 8-22 mins ?  ?   ? ? ?Navarre Diana P, PT ?Acute Rehabilitation Services ?Pager: [phone] ?Office: [phone] ? ? ?Lynard Postlewait B Ripley Lovecchio ?05/04/2021, 9:43 AM ? ?

## 2021-05-04 NOTE — Progress Notes (Signed)
OT Cancellation Note ? ?Patient Details ?Name: LASHAUNTA SICARD ?MRN: [id number] ?DOB: [birth year] ? ? ?Cancelled Treatment:    Reason Eval/Treat Not Completed: OT screened, no needs identified, will sign off. Per PT patient demonstrating independence with ADLs, mobility.  No OT needs acutely. OT will sign off.  ? ?Jolaine Artist, OT ?Acute Rehabilitation Services ?Pager [phone] ?Office [phone] ? ? ?Delight Stare ?05/04/2021, 12:49 PM ?

## 2021-05-04 NOTE — TOC Initial Note (Signed)
Transition of Care (TOC) - Initial/Assessment Note  ? ? ?Patient Details  ?Name: Monica Carlson ?MRN: [id number] ?Date of Birth: 17-Jan-1966 ? ?Transition of Care (TOC) CM/SW Contact:    ?Graves-Bigelow, Ocie Cornfield, RN ?Phone Number: ?05/04/2021, 2:38 PM ? ?Clinical Narrative:  Risk for readmission assessment completed. PTA patient was from home alone. Patient states her cousin lives next door and checks in from time to time. Patient reports that she gets Aide Services via [facility] CAP's daily. Patient's medications are delivered and she uses [employer] for grocery delivery. No home needs identified by Case Manager. Case Manager will continue to follow for additional transition of care needs.  ? ?Expected Discharge Plan: Home/Self Care ?Barriers to Discharge: No Barriers Identified ? ? ?Patient Goals and CMS Choice ?Patient states their goals for this hospitalization and ongoing recovery are:: to return home ?  ?Choice offered to / list presented to : NA ? ?Expected Discharge Plan and Services ?Expected Discharge Plan: Home/Self Care ?In-house Referral: NA ?Discharge Planning Services: CM Consult ?Post Acute Care Choice: NA ?Living arrangements for the past 2 months: [place] ?                ?  ?DME Agency: NA ?  ?  ?  ?HH Arranged: NA ?  ?  ?  ?  ? ?Prior Living Arrangements/Services ?Living arrangements for the past 2 months: [place] ?Lives with:: Self ?Patient language and need for interpreter reviewed:: Yes ?Do you feel safe going back to the place where you live?: Yes      ?Need for Family Participation in Patient Care: Yes (Comment) ?Care giver support system in place?: Yes (comment) ?  ?Criminal Activity/Legal Involvement Pertinent to Current Situation/Hospitalization: No - Comment as needed ? ?Activities of Daily Living ?Home Assistive Devices/Equipment: Gilford Rile (specify type), Other (Comment), Cane (specify quad or straight) ?ADL Screening (condition at time of admission) ?Patient's  cognitive ability adequate to safely complete daily activities?: Yes ?Is the patient deaf or have difficulty hearing?: No ?Does the patient have difficulty seeing, even when wearing glasses/contacts?: No ?Does the patient have difficulty concentrating, remembering, or making decisions?: No ?Patient able to express need for assistance with ADLs?: Yes ?Does the patient have difficulty dressing or bathing?: No ?Independently performs ADLs?: No ?Communication: Independent ?Dressing (OT): Independent ?Grooming: Independent ?Feeding: Independent ?Bathing: Independent ?Toileting: Independent ?In/Out Bed: Independent ?Does the patient have difficulty walking or climbing stairs?: Yes ?Weakness of Legs: Both ?Weakness of Arms/Hands: None ? ?Permission Sought/Granted ?Permission sought to share information with : Facility Sport and exercise psychologist, Case Manager ?  ?   ?   ?   ?   ? ?Emotional Assessment ?Appearance:: Appears stated age ?Attitude/Demeanor/Rapport: Engaged ?Affect (typically observed): Appropriate ?Orientation: : Oriented to Self, Oriented to Place, Oriented to  Time, Oriented to Situation ?Alcohol / Substance Use: Not Applicable ?Psych Involvement: No (comment) ? ?Admission diagnosis:  Elevated troponin [R77.8] ?Chest pain [R07.9] ?Heart failure ([place]) [I50.9] ?Patient Active Problem List  ? Diagnosis Date Noted  ? CKD (chronic kidney disease), stage IV ([place]) 04/22/2021  ? Coronary artery disease 04/21/2021  ? Prolonged QT interval 04/21/2021  ? AKI (acute kidney injury) ([place])   ? Acute myocardial infarction [facility])   ? NSTEMI (non-ST elevated myocardial infarction) ([place]) 03/19/2021  ? IDA (iron deficiency anemia) 06/25/2019  ? Vitamin D deficiency 10/17/2018  ? Diabetes mellitus without complication ([place]) 78/46/9629  ? Encounter for screening colonoscopy 03/21/2017  ? Hypertension   ? High cholesterol   ?  Acute ischemic stroke ([place]) 10/11/2016  ? Severe left ventricular systolic dysfunction   ? History of right MCA  stroke 10/10/2016  ? Dysphagia   ? Acute systolic congestive heart failure ([place])   ? Acute on chronic combined systolic and diastolic CHF (congestive heart failure) ([place]) 10/08/2016  ? Acute on chronic diastolic CHF (congestive heart failure) ([place]) 10/07/2016  ? Uncontrolled type 2 diabetes mellitus with hyperglycemia ([place]) 10/07/2016  ? Personal history of noncompliance with medical treatment, presenting hazards to health 12/26/2015  ? Hypothyroidism 05/30/2015  ? Uncontrolled type 2 diabetes mellitus with complication 15/94/5859  ? Coronary artery disease involving native coronary artery of native heart without angina pectoris 05/04/2015  ? Essential hypertension, benign 05/04/2015  ? Class 2 obesity 05/04/2015  ? Stented coronary artery 05/04/2015  ? History of MI (myocardial infarction) 05/04/2015  ? Hypertensive cardiomyopathy ([place]) 05/04/2015  ? ?PCP:  [facility] ?Pharmacy:   ?[place] ?[place] [address] ?Phone: [phone] Fax: [phone] ? ? ? ? ?Social Determinants of Health (SDOH) Interventions ?  ? ?Readmission Risk Interventions ?Readmission Risk Prevention Plan 05/04/2021  ?Transportation Screening Complete  ?PCP or Specialist Appt within 3-5 Days Complete  ?[place] or Home Care Consult Complete  ?Social Work Consult for [place] Planning/Counseling Complete  ?Palliative Care Screening Not Applicable  ?Medication Review Press photographer) Complete  ?Some recent data might be hidden  ? ? ? ?

## 2021-05-05 ENCOUNTER — Ambulatory Visit: Payer: Medicare Other | Admitting: Cardiology

## 2021-05-05 ENCOUNTER — Other Ambulatory Visit (HOSPITAL_COMMUNITY): Payer: Self-pay

## 2021-05-05 LAB — BASIC METABOLIC PANEL
Anion gap: 10 (ref 5–15)
BUN: 38 mg/dL — ABNORMAL HIGH (ref 6–20)
CO2: 21 mmol/L — ABNORMAL LOW (ref 22–32)
Calcium: 9 mg/dL (ref 8.9–10.3)
Chloride: 107 mmol/L (ref 98–111)
Creatinine, Ser: 3.68 mg/dL — ABNORMAL HIGH (ref 0.44–1.00)
GFR, Estimated: 14 mL/min — ABNORMAL LOW (ref >60)
Glucose, Bld: 153 mg/dL — ABNORMAL HIGH (ref 70–99)
Potassium: 3.6 mmol/L (ref 3.5–5.1)
Sodium: 138 mmol/L (ref 135–145)

## 2021-05-05 LAB — GLUCOSE, CAPILLARY
Glucose-Capillary: 143 mg/dL — ABNORMAL HIGH (ref 70–99)
Glucose-Capillary: 317 mg/dL — ABNORMAL HIGH (ref 70–99)

## 2021-05-05 MED ORDER — POTASSIUM CHLORIDE CRYS ER 20 MEQ PO TBCR
40.0000 meq | EXTENDED_RELEASE_TABLET | Freq: Once | ORAL | Status: AC
  Administered 2021-05-05: 40 meq via ORAL
  Filled 2021-05-05: qty 2

## 2021-05-05 MED ORDER — TORSEMIDE 20 MG PO TABS
40.0000 mg | ORAL_TABLET | Freq: Every day | ORAL | 0 refills | Status: DC
  Filled 2021-05-05: qty 60, 30d supply, fill #0

## 2021-05-05 NOTE — Plan of Care (Signed)
?  Problem: Education: ?Goal: Ability to demonstrate management of disease process will improve ?Outcome: Adequate for Discharge ?Goal: Ability to verbalize understanding of medication therapies will improve ?Outcome: Adequate for Discharge ?Goal: Individualized Educational Video(s) ?Outcome: Adequate for Discharge ?  ?Problem: Cardiac: ?Goal: Ability to achieve and maintain adequate cardiopulmonary perfusion will improve ?Outcome: Adequate for Discharge ?  ?

## 2021-05-05 NOTE — TOC Transition Note (Signed)
Transition of Care (TOC) - CM/SW Discharge Note ? ? ?Patient Details  ?Name: Monica Carlson ?MRN: [id number] ?Date of Birth: [birth year] ? ?Transition of Care [facility]) CM/SW Contact:  ?Sharin Mons, RN ?Phone Number: ?05/05/2021, 12:28 PM ? ? ?Clinical Narrative:    ?Patient will DC to: home ?Anticipated DC date: 05/05/2021 ?Family notified: yes ?Transport by: car ? ?     -  NSTEMI ?Per MD patient ready for DC today . RN, patient, and patient's boyfriend [name] notified of DC. Pt without  HH or DME needs. Pt states boyfriend to assist with care if needed once d/c.  ?Post hospital f/u noted on AVS. ?Rx med will be delivered to bedside prior to discharge. ?[name]  ( [employer]go) to provide transportation to home. ? ?RNCM will sign off for now as intervention is no longer needed. Please consult [place] again if new needs arise.  ? ? ?Final next level of care: Home/Self Care ?Barriers to Discharge: No Barriers Identified ? ? ?Patient Goals and CMS Choice ?Patient states their goals for this hospitalization and ongoing recovery are:: to return home ?  ?Choice offered to / list presented to : NA ? ?Discharge Placement ?  ?           ?  ?  ?  ?  ? ?Discharge Plan and Services ?In-house Referral: NA ?Discharge Planning Services: CM Consult ?Post Acute Care Choice: NA          ?  ?DME Agency: NA ?  ?  ?  ?HH Arranged: NA ?  ?  ?  ?  ? ?Social Determinants of Health (SDOH) Interventions ?  ? ? ?Readmission Risk Interventions ?Readmission Risk Prevention Plan 05/04/2021  ?Transportation Screening Complete  ?PCP or Specialist Appt within 3-5 Days Complete  ?[place] or Home Care Consult Complete  ?Social Work Consult for [place] Planning/Counseling Complete  ?Palliative Care Screening Not Applicable  ?Medication Review Press photographer) Complete  ?Some recent data might be hidden  ? ? ? ? ? ?

## 2021-05-05 NOTE — Discharge Summary (Signed)
Physician Discharge Summary  ?Monica Carlson KZL:[id number] DOB: September 13, 1965 DOA: 05/02/2021 ? ?PCP: [facility] ? ?Admit date: 05/02/2021 ?Discharge date: 05/05/2021 ? ?Time spent:35 minutes ? ?Recommendations for Outpatient Follow-up:  ?Cardiology Estella Husk 3/27 ?Nephrology Dr.Bhutani in 2 weeks ? ? ?Discharge Diagnoses:  ?Principal Problem: ?  NSTEMI ?  Acute on chronic combined systolic and diastolic CHF (congestive heart failure) ([place]) ?  CKD (chronic kidney disease), stage IV ([place]) ?  Hypertension ?  Hypothyroidism ?  Uncontrolled type 2 diabetes mellitus with hyperglycemia ([place]) ?  Class 2 obesity ? ? ?Discharge Condition: Stable ? ?Diet recommendation: Diabetic, low-sodium, heart healthy ? ?Filed Weights  ? 05/03/21 0453 05/04/21 0539 05/05/21 0618  ?Weight: 84.4 kg 83.5 kg 82.6 kg  ? ? ?History of present illness:  ?55 yo female with the past medical history of coronary artery disease, type 2 diabetes mellitus, heart failure, dyslipidemia, stage IV chronic kidney disease and obesity class 2, who presented with chest pain. She reported acute onset of chest pain, not exercise related, associated with dyspnea that resolved after one dose of nitroglycerin. ? ?Hospital Course:  ? ?NSTEMI (non-ST elevated myocardial infarction) ([place]) ?-Presented with chest pain, high sensitive troponin peaked at 529 ?-Has known multivessel CAD, last cath 1/23 noted severe multivessel coronary disease, occluded RCA stent, seen by thoracic surgery, not felt to be a candidate for CABG due to comorbidities ?-Treated conservatively with medical management, IV heparin, risk factor modification ?-Improved and stable now, echo noted EF of 40-45% with global hypokinesis  ?-Continue aspirin, Plavix, Coreg, statin, Imdur ?-Close follow-up with cardiology ?-High risk of quick readmission ?  ?Acute on chronic combined systolic and diastolic CHF (congestive heart failure) ([place]) ?Echocardiogram from 02/2021 with a  preserved LV systolic function with EF 40 to 45% with global hypokinesis. ?-Diuresed with IV Lasix, she is 2 L negative and clinically felt to be euvolemic at this time ?-She was transitioned to torsemide 40 Mg daily yesterday ?-CKD4 limits further GDMT ?-Close follow-up with cardiology in 2 weeks ?  ?CKD (chronic kidney disease), stage IV ([place]) ?Hyponatremia. Hypokalemia. ?-Baseline creatinine 3.2-3.6, creatinine stable at 3.6 today ?-Discharged on torsemide 40 Mg daily instead of Lasix ?-She is followed by Dr. Bhutani(nephrologist in [place]), has an upcoming appointment in 2 weeks, advised to have labs repeated then ?  ?Hypertension ?-Stable, continue Coreg and Imdur ?  ?Hypothyroidism ?-cont home Synthroid.  ?  ?Uncontrolled type 2 diabetes mellitus with hyperglycemia ([place]) ?-Stable, glipizide Januvia and Levemir resumed ?-If kidney function worsens would recommend discontinuing glipizide ?  ?Class 2 obesity ?Calculated BMI is 35.7  ?  ?  ?  ?Discharge Exam: ?Vitals:  ? 05/05/21 0618 05/05/21 0752  ?BP: 115/76 112/68  ?Pulse: 60 (!) 59  ?Resp: 20 17  ?Temp: 98.1 ?F (36.7 ?C) 97.9 ?F (36.6 ?C)  ?SpO2: 100% 98%  ? ? ?Gen: Awake, Alert, Oriented X 3,  ?HEENT: no JVD ?Lungs: Good air movement bilaterally, CTAB ?CVS: S1S2/RRR ?Abd: soft, Non tender, non distended, BS present ?Extremities: No edema ?Skin: no new rashes on exposed skin  ? ?Discharge Instructions ? ? ?Discharge Instructions   ? ? AMB Referral to Advanced Lipid Disorders Clinic   Complete by: As directed ?  ? Internal Lipid Clinic Referral Scheduling ? ?Internal lipid clinic referrals are providers within [facility], who wish to refer established patients for routine management (help in starting PCSK9 inhibitor therapy) or advanced therapies. ? ?Internal MD referral criteria: ? ?  1. All patients with LDL>190 mg/dL ? 2. All patients with Triglycerides >500 mg/dL ? 3. Patients with suspected or confirmed heterozygous familial hyperlipidemia (HeFH)  or homozygous familial hyperlipidemia (HoFH) ? 4. Patients with family history of suspicious for genetic dyslipidemia desiring genetic testing ? 5. Patients refractory to standard guideline based therapy ? 6. Patients with statin intolerance (failed 2 statins, one of which must be a high potency statin) ? 7. Patients who the provider desires to be seen by MD ? ? ?Internal PharmD referral criteria: ? ? 1. Follow-up patients for medication management ? 2. Follow-up for compliance monitoring ? 3. Patients for drug education ? 4. Patients with statin intolerance ? 5. PCSK9 inhibitor education and prior authorization approvals ? 6. Patients with triglycerides <500 mg/dL ? ?External Lipid Clinic Referral ? ?External lipid clinic referrals are for providers outside of [facility], considered new clinic patients - automatically routed to MD schedule  ? Diet - low sodium heart healthy   Complete by: As directed ?  ? Diet Carb Modified   Complete by: As directed ?  ? Increase activity slowly   Complete by: As directed ?  ? ?  ? ?Allergies as of 05/05/2021   ? ?   Reactions  ? Cedar Swelling  ?   ? Other Swelling  ? Teriyaki  ? ?  ? ?  ?Medication List  ?  ? ?STOP taking these medications   ? ?chlorthalidone 25 MG tablet ?Commonly known as: HYGROTON ?  ?furosemide 20 MG tablet ?Commonly known as: LASIX ?  ?furosemide 40 MG tablet ?Commonly known as: LASIX ?  ?potassium chloride SA 20 MEQ tablet ?Commonly known as: KLOR-CON M ?  ? ?  ? ?TAKE these medications   ? ?Accu-Chek Guide Me w/Device Kit ?1 Piece by Does not apply route as directed. ?  ?acetaminophen 500 MG tablet ?Commonly known as: TYLENOL ?Take 1,000-1,500 mg by mouth every 6 (six) hours as needed (migraine). ?  ?albuterol 108 (90 Base) MCG/ACT inhaler ?Commonly known as: VENTOLIN HFA ?Inhale 2 puffs into the lungs every 4 (four) hours as needed for shortness of breath. ?  ?aspirin 81 MG EC tablet ?Take 1 tablet (81 mg total) by mouth daily. Swallow whole. ?What  changed: when to take this ?  ?atorvastatin 80 MG tablet ?Commonly known as: LIPITOR ?Take 1 tablet (80 mg total) by mouth at bedtime. ?  ?carvedilol 6.25 MG tablet ?Commonly known as: COREG ?Take 6.25 mg by mouth 2 (two) times daily. ?  ?clopidogrel 75 MG tablet ?Commonly known as: PLAVIX ?TAKE ONE TABLET BY MOUTH EVERY DAY WITH BREAKFAST ?What changed: See the new instructions. ?  ?ezetimibe 10 MG tablet ?Commonly known as: ZETIA ?Take 1 tablet (10 mg total) by mouth daily. ?  ?gabapentin 300 MG capsule ?Commonly known as: NEURONTIN ?Take 300 mg by mouth 2 (two) times daily. ?  ?glipiZIDE 10 MG tablet ?Commonly known as: GLUCOTROL ?Take 10 mg by mouth 2 (two) times daily before a meal. ?  ?glucose blood test strip ?Commonly known as: Accu-Chek Guide ?Use as instructed 4 x daily. e11.65 ?  ?glucose blood test strip ?1 each by Other route 2 (two) times daily. Use as instructed bid. E11.65 ?  ?icosapent Ethyl 1 g capsule ?Commonly known as: Vascepa ?Take 2 capsules (2 g total) by mouth 2 (two) times daily. With food ?  ?insulin detemir 100 UNIT/ML FlexPen ?Commonly known as: LEVEMIR ?Inject 15 Units into the skin at bedtime. ?  ?isosorbide  mononitrate 30 MG 24 hr tablet ?Commonly known as: IMDUR ?Take 0.5 tablets (15 mg total) by mouth daily. ?  ?Januvia 50 MG tablet ?Generic drug: sitaGLIPtin ?TAKE 1 TABLET BY MOUTH EVERY DAY ?What changed:  ?how much to take ?when to take this ?  ?Lancets Thin Misc ?1 each by Does not apply route 4 (four) times daily. E11.65 Pt has Prodigy meter ?  ?levothyroxine 125 MCG tablet ?Commonly known as: SYNTHROID ?TAKE 1 TABLET BY MOUTH DAILY BEFORE BREAKFAST ?  ?nitroGLYCERIN 0.4 MG SL tablet ?Commonly known as: NITROSTAT ?Place 1 tablet (0.4 mg total) under the tongue every 5 (five) minutes as needed for chest pain. ?  ?Pen Needles 32G X 5 MM Misc ?Use 1 needle daily to inject insulin as instructed. ?  ?sodium bicarbonate 650 MG tablet ?Take 1 tablet (650 mg total) by mouth 2 (two)  times daily. ?  ?Torsemide 40 MG Tabs ?Take 40 mg by mouth daily. ?Start taking on: May 06, 2021 ?  ? ?  ? ?Allergies  ?Allergen Reactions  ? Cedar Swelling  ?   ?  ? Other Swelling  ?  Teriyaki  ? ? Follow-u

## 2021-05-05 NOTE — Progress Notes (Addendum)
? ?Progress Note ? ?Patient Name: Monica Carlson ?Date of Encounter: 05/05/2021 ? ?[facility] Cardiologist: [place] Follow up (former [facility] pt) ? ?Subjective  ? ?No complaints this morning.  ? ?Inpatient Medications  ?  ?Scheduled Meds: ? aspirin EC  81 mg Oral Daily  ? atorvastatin  80 mg Oral QHS  ? carvedilol  6.25 mg Oral BID WC  ? clopidogrel  75 mg Oral Daily  ? diclofenac Sodium  2 g Topical QID  ? enoxaparin (LOVENOX) injection  30 mg Subcutaneous Q24H  ? ezetimibe  10 mg Oral Daily  ? insulin aspart  0-9 Units Subcutaneous TID WC  ? isosorbide mononitrate  15 mg Oral Daily  ? levothyroxine  125 mcg Oral QAC breakfast  ? torsemide  40 mg Oral Daily  ? ?Continuous Infusions: ? ?PRN Meds: ?acetaminophen **OR** acetaminophen, nitroGLYCERIN  ? ?Vital Signs  ?  ?Vitals:  ? 05/04/21 1954 05/04/21 2154 05/05/21 0618 05/05/21 0752  ?BP: 116/72 117/68 115/76 112/68  ?Pulse: 64 67 60 (!) 59  ?Resp: '16 19 20 17  '$ ?Temp: 98.2 ?F (36.8 ?C) 98.1 ?F (36.7 ?C) 98.1 ?F (36.7 ?C) 97.9 ?F (36.6 ?C)  ?TempSrc: Oral Oral Oral Oral  ?SpO2: 95% 100% 100% 98%  ?Weight:   82.6 kg   ?Height:      ? ? ?Intake/Output Summary (Last 24 hours) at 05/05/2021 0942 ?Last data filed at 05/05/2021 0300 ?Gross per 24 hour  ?Intake 660 ml  ?Output 2150 ml  ?Net -1490 ml  ? ?Last 3 Weights 05/05/2021 05/04/2021 05/03/2021  ?Weight (lbs) 182 lb 1.6 oz 184 lb 1.6 oz 186 lb 1.1 oz  ?Weight (kg) 82.6 kg 83.507 kg 84.4 kg  ?   ? ?Telemetry  ?  ?SR - Personally Reviewed ? ?ECG  ?  ?No new tracing ? ?Physical Exam  ? ?GEN: No acute distress.   ?Neck: No JVD ?Cardiac: RRR, no murmurs, rubs, or gallops.  ?Respiratory: Clear to auscultation bilaterally. ?GI: Soft, nontender, non-distended  ?MS: No edema; No deformity. ?Neuro:  Nonfocal  ?Psych: Normal affect  ? ?Labs  ?  ?High Sensitivity Troponin:   ?Recent Labs  ?Lab 04/21/21 ?0616 04/22/21 ?9562 05/02/21 ?0420 05/02/21 ?1308 05/02/21 ?0930  ?TROPONINIHS 845* 867* 55* 147* 529*  ?   ?Chemistry ?Recent Labs   ?Lab 05/02/21 ?6578 05/03/21 ?0014 05/04/21 ?4696 05/05/21 ?0436  ?NA  --  138 136 138  ?K  --  3.8 3.8 3.6  ?CL  --  107 109 107  ?CO2  --  18* 19* 21*  ?GLUCOSE  --  154* 165* 153*  ?BUN  --  34* 37* 38*  ?CREATININE  --  3.40* 3.58* 3.68*  ?CALCIUM  --  9.1 8.8* 9.0  ?MG 2.2  --   --   --   ?GFRNONAA  --  15* 14* 14*  ?ANIONGAP  --  '13 8 10  '$ ?  ?Lipids No results for input(s): CHOL, TRIG, HDL, LABVLDL, LDLCALC, CHOLHDL in the last 168 hours.  ?Hematology ?Recent Labs  ?Lab 05/02/21 ?0420 05/03/21 ?0014  ?WBC 12.0* 13.5*  ?RBC 3.28* 3.12*  ?HGB 10.5* 9.8*  ?HCT 32.7* 31.2*  ?MCV 99.7 100.0  ?MCH 32.0 31.4  ?MCHC 32.1 31.4  ?RDW 14.2 14.0  ?PLT 318 324  ? ?Thyroid No results for input(s): TSH, FREET4 in the last 168 hours.  ?BNP ?Recent Labs  ?Lab 05/02/21 ?0420  ?BNP 1,132.0*  ?  ?DDimer No results for input(s): DDIMER in the  last 168 hours.  ? ?Radiology  ?  ?No results found. ? ?Cardiac Studies  ? ?Echocardiogram 03/20/2021: ?IMPRESSIONS  ? ? ? 1. Left ventricular ejection fraction, by estimation, is 40 to 45%. The  ?left ventricle has mildly decreased function. The left ventricle  ?demonstrates global hypokinesis. Left ventricular diastolic parameters are  ?consistent with Grade I diastolic  ?dysfunction (impaired relaxation). Elevated left atrial pressure.  ? 2. Right ventricular systolic function is normal. The right ventricular  ?size is normal. Tricuspid regurgitation signal is inadequate for assessing  ?PA pressure.  ? 3. The mitral valve is abnormal. Mild mitral valve regurgitation. No  ?evidence of mitral stenosis.  ? 4. The aortic valve is tricuspid. There is moderate calcification of the  ?aortic valve. There is moderate thickening of the aortic valve. Aortic  ?valve regurgitation is not visualized. No aortic stenosis is present.  ?  ?Images personally reviewed ?  ?Cardiac catheterization 03/23/2021: ?  Mid RCA lesion is 100% stenosed.  Left to right collaterals. ?  2nd Mrg lesion is 90% stenosed. ?   Ost LAD to Mid LAD lesion is 75% stenosed. ?  1st Diag lesion is 75% stenosed. ?  LV end diastolic pressure is mildly elevated. ?  There is no aortic valve stenosis. ?  ?Severe, multivessel disease.  Diffuse disease throughout the left system.  Occluded mid RCA stent.  Large right PDA and distal RCA system which fills by collaterals from the left.   ?  ?Diagnostic ?Dominance: Right ? ? ?Patient Profile  ?   ?56 y.o. female with longstanding type 2 diabetes, stage IV chronic kidney disease, and severe multivessel coronary artery disease, presenting with symptoms consistent with acute on chronic systolic heart failure. ? ?Assessment & Plan  ?  ?HFrEF/ICM: BNP 1132 on admission, CXR with edema. Has been diuresing with IV lasix '40mg'$  BID, now transitioned to torsemide '40mg'$  daily. Weight down additional 2lbs and UOP 2.1L overnight. Overall clinically improved.  ?-- continue torsemide '40mg'$  daily ?-- continue coreg 6.'25mg'$  BID, Imdur '15mg'$  daily  ?-- no room for additional GDMT with renal disease ?  ?Elevated Troponin: hsTn 147>>529. No anginal symptoms. Recent cardiac cath back 02/2021 and not felt to be a candidate for CABG or PCI. Medical therapy only option. She has been able to ambulate without chest pain.  ?-- continue ASA, Lipitor '80mg'$  daily, coreg 6.'25mg'$  BID, plavix '75mg'$  daily, Zetia '10mg'$  daily, Imdur '15mg'$  daily  ?  ?HLD: on high dose statin and Zetia, LDL remains above goal ?-- referral to lipid clinic at discharge  ?  ?Stage IV CKD: Cr baseline around 3.2-3.6. Stable at 3.68 today ?  ?HTN: stable ?-- continue coreg 6.'25mg'$  BID and Imdur '15mg'$  daily  ? ?DM: Hgb A1c 8.2 on recent admission ?-- SSI ?  ?OSA?: documented drop in O2 sats while sleeping requiring O2 ?--would benefit from outpatient sleep study ? ?Will arrange outpatient follow up in the office. She reports she is scheduled to follow up with nephrology as well ? ?For questions or updates, please contact [place] ?Please consult [email or web address] for contact  info under  ? ?  ?   ?Signed, ?Reino Bellis, NP  ?05/05/2021, 9:42 AM   ? ?Patient seen, examined. Available data reviewed. Agree with findings, assessment, and plan as outlined by Reino Bellis, NP.  The patient is independently interviewed and examined.  She is alert, oriented, in no distress.  The patient is sitting in the chair at the bedside.  JVP is normal, lungs  are clear bilaterally, heart is regular rate and rhythm with no murmur gallop, abdomen soft nontender, extremities have no edema, skin is warm and dry without rash.  The patient is medically stable.  Her volume status is greatly improved.  I think she is ready for hospital discharge.  Her creatinine is essentially stable today at 3.68 which is about her baseline.  I am hopeful that she will do better on torsemide as an outpatient with better bioavailability.  We will dose her at 40 mg daily.  Outpatient cardiology follow-up has been arranged. ? ?Sherren Mocha, M.D. ?05/05/2021 ?1:24 PM ? ? ?

## 2021-05-05 NOTE — Progress Notes (Signed)
Mobility Specialist Progress Note  ? ? 05/05/21 1142  ?Mobility  ?Activity Ambulated independently in hallway  ?Level of Assistance Modified independent, requires aide device or extra time  ?Assistive Device Front wheel walker  ?Distance Ambulated (ft) 220 ft  ?Activity Response Tolerated fair  ?$Mobility charge 1 Mobility  ? ?Pt received in chair and agreeable. No complaints on walk. Returned to chair with call bell in reach and RN present.  ? ?Hildred Alamin ?Mobility Specialist  ?M.S. 5N: [phone]  ?

## 2021-05-10 NOTE — Progress Notes (Deleted)
? ?Cardiology Office Note   ? ?Date:  05/10/2021  ? ?ID:  Monica Carlson, DOB 03/12/1965, MRN [id number] ? ? ?PCP:  [facility] ?  ?[place]  ?Cardiologist:  None *** ?Advanced Practice Provider:  No care team member to display ?Electrophysiologist:  None  ? ?[id number]}  ? ?No chief complaint on file. ? ? ?History of Present Illness:  ?Monica Carlson is a 56 y.o. female  with history of HTN, longstanding type 2 diabetes, stage IV chronic kidney disease, and Cath 03/23/21 severe multivessel coronary artery disease, occluded mid RCA stent, large rPDA and dRCA fill by collaterals from left. ICM EF improved 40-45% echo 03/20/21. ? ?Patient admitted with acute CHF 04/2021 diuresed with IV lasix and sent home on torsemide. Crt 3.68 at baseline.   ? ? ? ?Past Medical History:  ?Diagnosis Date  ? Cervical cancer ([place])   ? cryotherapy and [place]pos[place]; 1991  ? CHF (congestive heart failure) ([place]) 07-2014  ? Diabetes mellitus without complication ([place]) 0938  ? High cholesterol   ? Hypertension   ? MI (myocardial infarction) [facility])   ? 2004  ? Stroke [facility]) 09/2014, 2013  ? Vaginal Pap smear, abnormal   ? ? ?Past Surgical History:  ?Procedure Laterality Date  ? CHOLECYSTECTOMY  1992  ? CORONARY STENT PLACEMENT  2004  ? LEFT HEART CATH AND CORONARY ANGIOGRAPHY N/A 03/23/2021  ? Procedure: LEFT HEART CATH AND CORONARY ANGIOGRAPHY;  Surgeon: Jettie Booze, MD;  Location: [place] [facility];  Service: Cardiovascular;  Laterality: N/A;  ? TUBAL LIGATION    ? ? ?Current Medications: ?No outpatient medications have been marked as taking for the 05/17/21 encounter (Appointment) with Imogene Burn, PA-C.  ? ?Current Facility-Administered Medications for the 05/17/21 encounter (Appointment) with Imogene Burn, PA-C  ?Medication  ? glucose blood test strip STRP 1 each  ?  ? ?Allergies:   Cedar and Other  ? ?Social History  ? ?Socioeconomic History  ? Marital status:  Legally Separated  ?  Spouse name: Not on file  ? Number of children: Not on file  ? Years of education: Not on file  ? Highest education level: Not on file  ?Occupational History  ? Not on file  ?Tobacco Use  ? Smoking status: Never  ? Smokeless tobacco: Never  ?Vaping Use  ? Vaping Use: Never used  ?Substance and Sexual Activity  ? Alcohol use: No  ? Drug use: No  ? Sexual activity: Yes  ?  Birth control/protection: None, Surgical  ?  Comment: tubal  ?Other Topics Concern  ? Not on file  ?Social History Narrative  ? Lives alone.   ? Has some friends.   ? Neighbors.   ? ?Social Determinants of Health  ? ?Financial Resource Strain: Not on file  ?Food Insecurity: Not on file  ?Transportation Needs: Not on file  ?Physical Activity: Not on file  ?Stress: Not on file  ?Social Connections: Not on file  ?  ? ?Family History:  The patient's ***family history includes Diabetes in her father; Heart attack in her maternal grandfather; Heart disease in her father; Hypertension in her father; Lung cancer in her paternal grandmother; Migraines in her maternal grandfather and mother.  ? ?ROS:   ?Please see the history of present illness.    ?ROS All other systems reviewed and are negative. ? ? ?PHYSICAL EXAM:   ?VS:  There were no vitals taken for this visit.  ?  Physical Exam  ?GEN: Well nourished, well developed, in no acute distress  ?HEENT: normal  ?Neck: no JVD, carotid bruits, or masses ?Cardiac:RRR; no murmurs, rubs, or gallops  ?Respiratory:  clear to auscultation bilaterally, normal work of breathing ?GI: soft, nontender, nondistended, + BS ?Ext: without cyanosis, clubbing, or edema, Good distal pulses bilaterally ?MS: no deformity or atrophy  ?Skin: warm and dry, no rash ?Neuro:  Alert and Oriented x 3, Strength and sensation are intact ?Psych: euthymic mood, full affect ? ?Wt Readings from Last 3 Encounters:  ?05/05/21 182 lb 1.6 oz (82.6 kg)  ?04/23/21 185 lb 3 oz (84 kg)  ?03/24/21 192 lb 14.4 oz (87.5 kg)  ?   ? ? ?Studies/Labs Reviewed:  ? ?EKG:  EKG is*** ordered today.  The ekg ordered today demonstrates *** ? ?Recent Labs: ?12/02/2020: TSH 16.100 ?04/21/2021: ALT 9 ?05/02/2021: B Natriuretic Peptide 1,132.0; Magnesium 2.2 ?05/03/2021: Hemoglobin 9.8; Platelets 324 ?05/05/2021: BUN 38; Creatinine, Ser 3.68; Potassium 3.6; Sodium 138  ? ?Lipid Panel ?   ?Component Value Date/Time  ? CHOL 240 (H) 03/20/2021 0255  ? TRIG 248 (H) 03/20/2021 0255  ? HDL 36 (L) 03/20/2021 0255  ? CHOLHDL 6.7 03/20/2021 0255  ? VLDL 50 (H) 03/20/2021 0255  ? Anton 154 (H) 03/20/2021 0255  ? LDLCALC 148 (H) 04/20/2017 1418  ? ? ?Additional studies/ records that were reviewed today include:  ? Echocardiogram 03/20/2021: ?IMPRESSIONS  ? ? ? 1. Left ventricular ejection fraction, by estimation, is 40 to 45%. The  ?left ventricle has mildly decreased function. The left ventricle  ?demonstrates global hypokinesis. Left ventricular diastolic parameters are  ?consistent with Grade I diastolic  ?dysfunction (impaired relaxation). Elevated left atrial pressure.  ? 2. Right ventricular systolic function is normal. The right ventricular  ?size is normal. Tricuspid regurgitation signal is inadequate for assessing  ?PA pressure.  ? 3. The mitral valve is abnormal. Mild mitral valve regurgitation. No  ?evidence of mitral stenosis.  ? 4. The aortic valve is tricuspid. There is moderate calcification of the  ?aortic valve. There is moderate thickening of the aortic valve. Aortic  ?valve regurgitation is not visualized. No aortic stenosis is present.  ?  ?Images personally reviewed ?  ?Cardiac catheterization 03/23/2021: ?  Mid RCA lesion is 100% stenosed.  Left to right collaterals. ?  2nd Mrg lesion is 90% stenosed. ?  Ost LAD to Mid LAD lesion is 75% stenosed. ?  1st Diag lesion is 75% stenosed. ?  LV end diastolic pressure is mildly elevated. ?  There is no aortic valve stenosis. ?  ?Severe, multivessel disease.  Diffuse disease throughout the left system.   Occluded mid RCA stent.  Large right PDA and distal RCA system which fills by collaterals from the left.   ?  ?Diagnostic ?Dominance: Right ?  ? ? ?Risk Assessment/Calculations:   ?{Does this patient have ATRIAL FIBRILLATION?:[id number]} ? ? ? ? ?ASSESSMENT:   ? ?No diagnosis found. ? ? ?PLAN:  ?In order of problems listed above: ? ?HFrEF/ICM EF 40-45% on echo 02/2021 on torsemide, coreg,imdur ? ?CAD severe multivessel on cath 02/2021 no angina ? ?Stage 4 CKD ? ?HTN ? ?DM A1C 8.2 ? ?OSA-needs sleep study ? ?Shared Decision Making/Informed Consent   ?{Are you ordering a CV Procedure (e.g. stress test, cath, DCCV, TEE, etc)?   Press F2        :[id number]}  ? ? ?Medication Adjustments/Labs and Tests Ordered: ?Current medicines are reviewed at length with the patient today.  Concerns regarding medicines are outlined above.  Medication changes, Labs and Tests ordered today are listed in the Patient Instructions below. ?There are no Patient Instructions on file for this visit.  ? ?Signed, ?Ermalinda Barrios, PA-C  ?05/10/2021 3:48 PM    ?[facility] ?[address], [place], [place]  [address] ?Phone: [phone]; Fax: [phone]  ? ? ?

## 2021-05-17 ENCOUNTER — Ambulatory Visit: Payer: Medicare Other | Admitting: Physician Assistant

## 2021-05-17 DIAGNOSIS — E782 Mixed hyperlipidemia: Secondary | ICD-10-CM

## 2021-05-17 DIAGNOSIS — I10 Essential (primary) hypertension: Secondary | ICD-10-CM

## 2021-05-17 DIAGNOSIS — I5020 Unspecified systolic (congestive) heart failure: Secondary | ICD-10-CM

## 2021-05-17 DIAGNOSIS — N184 Chronic kidney disease, stage 4 (severe): Secondary | ICD-10-CM

## 2021-05-17 DIAGNOSIS — I2510 Atherosclerotic heart disease of native coronary artery without angina pectoris: Secondary | ICD-10-CM

## 2021-05-17 DIAGNOSIS — E1165 Type 2 diabetes mellitus with hyperglycemia: Secondary | ICD-10-CM

## 2021-05-17 DIAGNOSIS — R29818 Other symptoms and signs involving the nervous system: Secondary | ICD-10-CM

## 2021-05-26 ENCOUNTER — Other Ambulatory Visit: Payer: Self-pay | Admitting: Nurse Practitioner

## 2021-06-24 ENCOUNTER — Other Ambulatory Visit: Payer: Self-pay

## 2021-06-24 ENCOUNTER — Encounter (HOSPITAL_COMMUNITY): Payer: Self-pay | Admitting: Emergency Medicine

## 2021-06-24 ENCOUNTER — Inpatient Hospital Stay (HOSPITAL_COMMUNITY)
Admission: EM | Admit: 2021-06-24 | Discharge: 2021-06-29 | DRG: 280 | Disposition: A | Discharge status: Home / Self Care | Payer: Medicare Other | Attending: Internal Medicine | Admitting: Internal Medicine

## 2021-06-24 ENCOUNTER — Emergency Department (HOSPITAL_COMMUNITY): Payer: Medicare Other

## 2021-06-24 DIAGNOSIS — Z8673 Personal history of transient ischemic attack (TIA), and cerebral infarction without residual deficits: Secondary | ICD-10-CM

## 2021-06-24 DIAGNOSIS — E1122 Type 2 diabetes mellitus with diabetic chronic kidney disease: Secondary | ICD-10-CM | POA: Diagnosis present

## 2021-06-24 DIAGNOSIS — Z9851 Tubal ligation status: Secondary | ICD-10-CM

## 2021-06-24 DIAGNOSIS — Z794 Long term (current) use of insulin: Secondary | ICD-10-CM

## 2021-06-24 DIAGNOSIS — I214 Non-ST elevation (NSTEMI) myocardial infarction: Secondary | ICD-10-CM | POA: Diagnosis not present

## 2021-06-24 DIAGNOSIS — E039 Hypothyroidism, unspecified: Secondary | ICD-10-CM | POA: Diagnosis present

## 2021-06-24 DIAGNOSIS — N185 Chronic kidney disease, stage 5: Secondary | ICD-10-CM | POA: Diagnosis present

## 2021-06-24 DIAGNOSIS — E1121 Type 2 diabetes mellitus with diabetic nephropathy: Secondary | ICD-10-CM

## 2021-06-24 DIAGNOSIS — E876 Hypokalemia: Secondary | ICD-10-CM | POA: Diagnosis present

## 2021-06-24 DIAGNOSIS — Z635 Disruption of family by separation and divorce: Secondary | ICD-10-CM

## 2021-06-24 DIAGNOSIS — I252 Old myocardial infarction: Secondary | ICD-10-CM

## 2021-06-24 DIAGNOSIS — I1311 Hypertensive heart and chronic kidney disease without heart failure, with stage 5 chronic kidney disease, or end stage renal disease: Secondary | ICD-10-CM

## 2021-06-24 DIAGNOSIS — E1165 Type 2 diabetes mellitus with hyperglycemia: Secondary | ICD-10-CM | POA: Diagnosis present

## 2021-06-24 DIAGNOSIS — I10 Essential (primary) hypertension: Secondary | ICD-10-CM

## 2021-06-24 DIAGNOSIS — I255 Ischemic cardiomyopathy: Secondary | ICD-10-CM | POA: Diagnosis present

## 2021-06-24 DIAGNOSIS — E669 Obesity, unspecified: Secondary | ICD-10-CM | POA: Diagnosis present

## 2021-06-24 DIAGNOSIS — Z79890 Hormone replacement therapy (postmenopausal): Secondary | ICD-10-CM

## 2021-06-24 DIAGNOSIS — Z91048 Other nonmedicinal substance allergy status: Secondary | ICD-10-CM

## 2021-06-24 DIAGNOSIS — N179 Acute kidney failure, unspecified: Secondary | ICD-10-CM | POA: Diagnosis present

## 2021-06-24 DIAGNOSIS — Z79899 Other long term (current) drug therapy: Secondary | ICD-10-CM

## 2021-06-24 DIAGNOSIS — I5043 Acute on chronic combined systolic (congestive) and diastolic (congestive) heart failure: Secondary | ICD-10-CM | POA: Diagnosis present

## 2021-06-24 DIAGNOSIS — Z833 Family history of diabetes mellitus: Secondary | ICD-10-CM

## 2021-06-24 DIAGNOSIS — K219 Gastro-esophageal reflux disease without esophagitis: Secondary | ICD-10-CM | POA: Diagnosis present

## 2021-06-24 DIAGNOSIS — I132 Hypertensive heart and chronic kidney disease with heart failure and with stage 5 chronic kidney disease, or end stage renal disease: Secondary | ICD-10-CM | POA: Diagnosis present

## 2021-06-24 DIAGNOSIS — Z7984 Long term (current) use of oral hypoglycemic drugs: Secondary | ICD-10-CM

## 2021-06-24 DIAGNOSIS — E782 Mixed hyperlipidemia: Secondary | ICD-10-CM | POA: Diagnosis present

## 2021-06-24 DIAGNOSIS — Z955 Presence of coronary angioplasty implant and graft: Secondary | ICD-10-CM

## 2021-06-24 DIAGNOSIS — Z7982 Long term (current) use of aspirin: Secondary | ICD-10-CM

## 2021-06-24 DIAGNOSIS — Z8541 Personal history of malignant neoplasm of cervix uteri: Secondary | ICD-10-CM

## 2021-06-24 DIAGNOSIS — Z7902 Long term (current) use of antithrombotics/antiplatelets: Secondary | ICD-10-CM

## 2021-06-24 DIAGNOSIS — I420 Dilated cardiomyopathy: Secondary | ICD-10-CM | POA: Diagnosis present

## 2021-06-24 DIAGNOSIS — Z6834 Body mass index (BMI) 34.0-34.9, adult: Secondary | ICD-10-CM

## 2021-06-24 DIAGNOSIS — Z91018 Allergy to other foods: Secondary | ICD-10-CM

## 2021-06-24 DIAGNOSIS — Z8249 Family history of ischemic heart disease and other diseases of the circulatory system: Secondary | ICD-10-CM

## 2021-06-24 DIAGNOSIS — D649 Anemia, unspecified: Secondary | ICD-10-CM | POA: Diagnosis present

## 2021-06-24 DIAGNOSIS — I2510 Atherosclerotic heart disease of native coronary artery without angina pectoris: Secondary | ICD-10-CM | POA: Diagnosis present

## 2021-06-24 LAB — BASIC METABOLIC PANEL
Anion gap: 8 (ref 5–15)
BUN: 37 mg/dL — ABNORMAL HIGH (ref 6–20)
CO2: 21 mmol/L — ABNORMAL LOW (ref 22–32)
Calcium: 8.6 mg/dL — ABNORMAL LOW (ref 8.9–10.3)
Chloride: 107 mmol/L (ref 98–111)
Creatinine, Ser: 3.61 mg/dL — ABNORMAL HIGH (ref 0.44–1.00)
GFR, Estimated: 14 mL/min — ABNORMAL LOW (ref >60)
Glucose, Bld: 357 mg/dL — ABNORMAL HIGH (ref 70–99)
Potassium: 4.3 mmol/L (ref 3.5–5.1)
Sodium: 136 mmol/L (ref 135–145)

## 2021-06-24 LAB — TROPONIN I (HIGH SENSITIVITY)
Troponin I (High Sensitivity): 29 ng/L — ABNORMAL HIGH (ref <18)
Troponin I (High Sensitivity): 311 ng/L (ref <18)

## 2021-06-24 LAB — CBC
HCT: 33.1 % — ABNORMAL LOW (ref 36.0–46.0)
Hemoglobin: 10.3 g/dL — ABNORMAL LOW (ref 12.0–15.0)
MCH: 31 pg (ref 26.0–34.0)
MCHC: 31.1 g/dL (ref 30.0–36.0)
MCV: 99.7 fL (ref 80.0–100.0)
Platelets: 300 10*3/uL (ref 150–400)
RBC: 3.32 MIL/uL — ABNORMAL LOW (ref 3.87–5.11)
RDW: 14.1 % (ref 11.5–15.5)
WBC: 10 10*3/uL (ref 4.0–10.5)
nRBC: 0 % (ref 0.0–0.2)

## 2021-06-24 MED ORDER — MORPHINE SULFATE (PF) 4 MG/ML IV SOLN
2.0000 mg | Freq: Once | INTRAVENOUS | Status: AC
  Administered 2021-06-24: 2 mg via INTRAVENOUS
  Filled 2021-06-24: qty 1

## 2021-06-24 MED ORDER — HEPARIN (PORCINE) 25000 UT/250ML-% IV SOLN
1300.0000 [IU]/h | INTRAVENOUS | Status: DC
Start: 2021-06-24 — End: 2021-06-27
  Administered 2021-06-24: 1000 [IU]/h via INTRAVENOUS
  Administered 2021-06-25: 1200 [IU]/h via INTRAVENOUS
  Administered 2021-06-26: 1300 [IU]/h via INTRAVENOUS
  Filled 2021-06-24 (×3): qty 250

## 2021-06-24 MED ORDER — HEPARIN BOLUS VIA INFUSION
4000.0000 [IU] | Freq: Once | INTRAVENOUS | Status: AC
  Administered 2021-06-24: 4000 [IU] via INTRAVENOUS

## 2021-06-24 NOTE — ED Triage Notes (Signed)
Pt arrived via [facility] c/o L arm pain that she explained what her last heart attack felt like, just not as intense this time. Per EMS, EKG R bundle branch block =, vitals WNL. Blood sugar 418 and she took 15 of diathetic med, 324 of aspriri before EMS arrival  ?

## 2021-06-24 NOTE — ED Notes (Signed)
Patient back from  X-ray 

## 2021-06-24 NOTE — ED Provider Notes (Signed)
?[facility] ?Provider Note ? ? ?CSN: [id number] ?Arrival date & time: 06/24/21  1748 ? ?  ? ?History ? ?No chief complaint on file. ? ? ?Monica Carlson is a 56 y.o. female. ? ?HPI ? ?This patient is a 56 year old female, she reports a history of having coronary disease when she was much younger, it was 20 years ago that she had a stent, but since that time she states she has not had any trouble with her heart.  That being said after a very quick chart review it is clear that she was admitted just 1 month ago with a non-ST elevation myocardial infarction.  She was seen by cardiothoracic surgery because of significant multivessel coronary disease and deemed to not be a candidate for revascularization secondary comorbidities.  It was decided that medical management would be optimal for this patient. ? ?She had been admitted to the hospital a couple of different times over the last several months for similar.  Evidently the patient is extremely sedentary, poorly controlled diabetes, ischemic cardiomyopathy with an ejection fraction of 40% and chronic kidney disease which is progressive ? ?Her symptoms today are actually related to her left arm and her left upper chest.  She reports this is similar to what she has had with heart attacks in the past.  She was concerned because it continues, it started 30 minutes prior to arrival ? ?Home Medications ?Prior to Admission medications   ?Medication Sig Start Date End Date Taking? Authorizing Provider  ?acetaminophen (TYLENOL) 500 MG tablet Take 1,000-1,500 mg by mouth every 6 (six) hours as needed (migraine).   Yes [provider]  ?albuterol (VENTOLIN HFA) 108 (90 Base) MCG/ACT inhaler Inhale 2 puffs into the lungs every 4 (four) hours as needed for shortness of breath. 02/27/21  Yes [provider]  ?aspirin EC 81 MG tablet Take 81 mg by mouth daily. Swallow whole.   Yes [provider]  ?atorvastatin (LIPITOR) 80 MG tablet Take 1  tablet (80 mg total) by mouth at bedtime. 03/24/21 06/24/21 Yes [place] Donnamarie Poag, DO  ?carvedilol (COREG) 6.25 MG tablet Take 6.25 mg by mouth 2 (two) times daily. 02/09/21  Yes [provider]  ?clopidogrel (PLAVIX) 75 MG tablet TAKE ONE TABLET BY MOUTH EVERY DAY WITH BREAKFAST ?Patient taking differently: Take 75 mg by mouth daily with breakfast. 07/10/17  Yes Hagler, Apolonio Schneiders, MD  ?ezetimibe (ZETIA) 10 MG tablet Take 1 tablet (10 mg total) by mouth daily. 04/15/21  Yes Sueanne Margarita, MD  ?famotidine (PEPCID) 20 MG tablet Take by mouth. 06/16/21 06/16/22 Yes [provider]  ?gabapentin (NEURONTIN) 300 MG capsule Take 300 mg by mouth 2 (two) times daily. 10/11/17  Yes [provider]  ?glipiZIDE (GLUCOTROL) 10 MG tablet Take 10 mg by mouth 2 (two) times daily before a meal.   Yes [provider]  ?icosapent Ethyl (VASCEPA) 1 g capsule Take 2 capsules (2 g total) by mouth 2 (two) times daily. With food 04/15/21  Yes Turner, Traci R, MD  ?insulin detemir (LEVEMIR) 100 UNIT/ML FlexPen Inject 15 Units into the skin at bedtime. 04/23/21  Yes Barton Dubois, MD  ?isosorbide mononitrate (IMDUR) 30 MG 24 hr tablet Take 0.5 tablets (15 mg total) by mouth daily. 04/24/21  Yes Barton Dubois, MD  ?JANUVIA 50 MG tablet TAKE 1 TABLET BY MOUTH EVERY DAY ?Patient taking differently: Take 50 mg by mouth every morning. 02/23/21  Yes Brita Romp, NP  ?levothyroxine (SYNTHROID) 125 MCG  tablet TAKE 1 TABLET BY MOUTH DAILY BEFORE BREAKFAST ?Patient taking differently: Take 125 mcg by mouth daily before breakfast. 02/23/21  Yes Brita Romp, NP  ?nitroGLYCERIN (NITROSTAT) 0.4 MG SL tablet Place 1 tablet (0.4 mg total) under the tongue every 5 (five) minutes as needed for chest pain. 08/15/17  Yes Caren Macadam, MD  ?potassium chloride SA (KLOR-CON M) 20 MEQ tablet Take 20 mEq by mouth 2 (two) times daily. 06/10/21  Yes [provider]  ?torsemide (DEMADEX) 20 MG tablet Take 2 tablets (40 mg total)  by mouth daily. 05/06/21  Yes Domenic Polite, MD  ?traZODone (DESYREL) 50 MG tablet Take 50 mg by mouth at bedtime as needed. 06/08/21  Yes [provider]  ?Blood Glucose Monitoring Suppl (ACCU-CHEK GUIDE ME) w/Device KIT 1 Piece by Does not apply route as directed. 07/09/18   Cassandria Anger, MD  ?glucose blood (ACCU-CHEK GUIDE) test strip Use as instructed 4 x daily. e11.65 07/10/18   Cassandria Anger, MD  ?glucose blood test strip 1 each by Other route 2 (two) times daily. Use as instructed bid. E11.65 12/18/18   Cassandria Anger, MD  ?Insulin Pen Needle (PEN NEEDLES) 32G X 5 MM MISC Use 1 needle daily to inject insulin as instructed. 04/23/21   Barton Dubois, MD  ?Lancets Thin MISC 1 each by Does not apply route 4 (four) times daily. E11.65 Pt has Prodigy meter 10/02/18   Cassandria Anger, MD  ?   ? ?Allergies    ?Cedar and Other   ? ?Review of Systems   ?Review of Systems  ?All other systems reviewed and are negative. ? ?Physical Exam ?Updated Vital Signs ?BP 133/78 (BP Location: Left Arm)   Pulse 82   Temp 98.4 ?F (36.9 ?C) (Oral)   Resp 18   Ht 1.549 m (_0 )   SpO2 97%   BMI 34.41 kg/m?  ?Physical Exam ?Vitals and nursing note reviewed.  ?Constitutional:   ?   General: She is not in acute distress. ?   Appearance: She is well-developed.  ?HENT:  ?   Head: Normocephalic and atraumatic.  ?   Mouth/Throat:  ?   Pharynx: No oropharyngeal exudate.  ?Eyes:  ?   General: No scleral icterus.    ?   Right eye: No discharge.     ?   Left eye: No discharge.  ?   Conjunctiva/sclera: Conjunctivae normal.  ?   Pupils: Pupils are equal, round, and reactive to light.  ?Neck:  ?   Thyroid: No thyromegaly.  ?   Vascular: No JVD.  ?Cardiovascular:  ?   Rate and Rhythm: Normal rate and regular rhythm.  ?   Heart sounds: Normal heart sounds. No murmur heard. ?  No friction rub. No gallop.  ?Pulmonary:  ?   Effort: Pulmonary effort is normal. No respiratory distress.  ?   Breath sounds: Normal  breath sounds. No wheezing or rales.  ?Abdominal:  ?   General: Bowel sounds are normal. There is no distension.  ?   Palpations: Abdomen is soft. There is no mass.  ?   Tenderness: There is no abdominal tenderness.  ?Musculoskeletal:     ?   General: No tenderness. Normal range of motion.  ?   Cervical back: Normal range of motion and neck supple.  ?Lymphadenopathy:  ?   Cervical: No cervical adenopathy.  ?Skin: ?   General: Skin is warm and dry.  ?   Findings: No erythema  or rash.  ?Neurological:  ?   General: No focal deficit present.  ?   Mental Status: She is alert.  ?   Coordination: Coordination normal.  ?Psychiatric:     ?   Behavior: Behavior normal.  ? ? ?ED Results / Procedures / Treatments   ?Labs ?(all labs ordered are listed, but only abnormal results are displayed) ?Labs Reviewed  ?BASIC METABOLIC PANEL - Abnormal; Notable for the following components:  ?    Result Value  ? CO2 21 (*)   ? Glucose, Bld 357 (*)   ? BUN 37 (*)   ? Creatinine, Ser 3.61 (*)   ? Calcium 8.6 (*)   ? GFR, Estimated 14 (*)   ? All other components within normal limits  ?CBC - Abnormal; Notable for the following components:  ? RBC 3.32 (*)   ? Hemoglobin 10.3 (*)   ? HCT 33.1 (*)   ? All other components within normal limits  ?TROPONIN I (HIGH SENSITIVITY) - Abnormal; Notable for the following components:  ? Troponin I (High Sensitivity) 29 (*)   ? All other components within normal limits  ?TROPONIN I (HIGH SENSITIVITY) - Abnormal; Notable for the following components:  ? Troponin I (High Sensitivity) 311 (*)   ? All other components within normal limits  ?HEPARIN LEVEL (UNFRACTIONATED)  ? ? ?EKG ?EKG Interpretation ? ?Date/Time:  Thursday Jun 24 2021 18:13:55 EDT ?Ventricular Rate:  84 ?PR Interval:  158 ?QRS Duration: 112 ?QT Interval:  392 ?QTC Calculation: 463 ?R Axis:   72 ?Text Interpretation: Normal sinus rhythm ST & T wave abnormality, consider inferolateral ischemia Prolonged QT Abnormal ECG When compared with ECG of  02-May-2021 04:02, PREVIOUS ECG IS PRESENT since last tracing no significant change Confirmed by Noemi Chapel [phone]) on 06/24/2021 6:33:20 PM ? ?Radiology ?DG Chest 1 View ? ?Result Date: 06/24/2021 ?CLINICAL DATA

## 2021-06-24 NOTE — ED Notes (Signed)
Pt ambulated to restroom. Complains of having SOB, placed on dinamap in hallway. O2 stat is 92% ?

## 2021-06-24 NOTE — Progress Notes (Signed)
ANTICOAGULATION CONSULT NOTE - Initial Consult ? ?Pharmacy Consult for Heparin ?Indication: chest pain/ACS ? ?Allergies  ?Allergen Reactions  ? Cedar Swelling  ?   ?  ? Other Swelling  ?  Teriyaki  ? ? ?Patient Measurements: ?Height: '5\' 1"'  (154.9 cm) ?IBW/kg (Calculated) : 47.8 ?Heparin Dosing Weight: 70 kg ? ?Vital Signs: ?Temp: 98.4 ?F (36.9 ?C) (05/04 1756) ?Temp Source: Oral (05/04 1756) ?BP: 129/67 (05/04 2200) ?Pulse Rate: 84 (05/04 2200) ? ?Labs: ?Recent Labs  ?  06/24/21 ?1851 06/24/21 ?2111  ?HGB 10.3*  --   ?HCT 33.1*  --   ?PLT 300  --   ?CREATININE 3.61*  --   ?TROPONINIHS 29* 311*  ? ? ?CrCl cannot be calculated (Unknown ideal weight.). ? ? ?Medical History: ?Past Medical History:  ?Diagnosis Date  ? Cervical cancer ([place])   ? cryotherapy and colposcopy/[place]; 1991  ? CHF (congestive heart failure) ([place]) 07-2014  ? Diabetes mellitus without complication ([place]) 9381  ? High cholesterol   ? Hypertension   ? MI (myocardial infarction) [facility])   ? 2004  ? Stroke [facility]) 09/2014, 2013  ? Vaginal Pap smear, abnormal   ? ? ?Medications:  ?Current Facility-Administered Medications on File Prior to Encounter  ?Medication Dose Route Frequency Provider Last Rate Last Admin  ? glucose blood test strip STRP 1 each  1 each Other BID Cassandria Anger, MD      ? ?Current Outpatient Medications on File Prior to Encounter  ?Medication Sig Dispense Refill  ? acetaminophen (TYLENOL) 500 MG tablet Take 1,000-1,500 mg by mouth every 6 (six) hours as needed (migraine).    ? albuterol (VENTOLIN HFA) 108 (90 Base) MCG/ACT inhaler Inhale 2 puffs into the lungs every 4 (four) hours as needed for shortness of breath.    ? aspirin EC 81 MG tablet Take 81 mg by mouth daily. Swallow whole.    ? atorvastatin (LIPITOR) 80 MG tablet Take 1 tablet (80 mg total) by mouth at bedtime. 30 tablet 2  ? carvedilol (COREG) 6.25 MG tablet Take 6.25 mg by mouth 2 (two) times daily.    ? clopidogrel (PLAVIX) 75 MG tablet TAKE ONE TABLET BY  MOUTH EVERY DAY WITH BREAKFAST (Patient taking differently: Take 75 mg by mouth daily with breakfast.) 90 tablet 1  ? ezetimibe (ZETIA) 10 MG tablet Take 1 tablet (10 mg total) by mouth daily. 30 tablet 11  ? famotidine (PEPCID) 20 MG tablet Take by mouth.    ? gabapentin (NEURONTIN) 300 MG capsule Take 300 mg by mouth 2 (two) times daily.    ? glipiZIDE (GLUCOTROL) 10 MG tablet Take 10 mg by mouth 2 (two) times daily before a meal.    ? icosapent Ethyl (VASCEPA) 1 g capsule Take 2 capsules (2 g total) by mouth 2 (two) times daily. With food 120 capsule 11  ? insulin detemir (LEVEMIR) 100 UNIT/ML FlexPen Inject 15 Units into the skin at bedtime. 15 mL 11  ? isosorbide mononitrate (IMDUR) 30 MG 24 hr tablet Take 0.5 tablets (15 mg total) by mouth daily. 30 tablet 2  ? JANUVIA 50 MG tablet TAKE 1 TABLET BY MOUTH EVERY DAY (Patient taking differently: Take 50 mg by mouth every morning.) 90 tablet 0  ? levothyroxine (SYNTHROID) 125 MCG tablet TAKE 1 TABLET BY MOUTH DAILY BEFORE BREAKFAST (Patient taking differently: Take 125 mcg by mouth daily before breakfast.) 90 tablet 0  ? nitroGLYCERIN (NITROSTAT) 0.4 MG SL tablet Place 1 tablet (0.4 mg total) under  the tongue every 5 (five) minutes as needed for chest pain. 30 tablet 2  ? potassium chloride SA (KLOR-CON M) 20 MEQ tablet Take 20 mEq by mouth 2 (two) times daily.    ? torsemide (DEMADEX) 20 MG tablet Take 2 tablets (40 mg total) by mouth daily. 60 tablet 0  ? traZODone (DESYREL) 50 MG tablet Take 50 mg by mouth at bedtime as needed.    ? [EXPIRED] aspirin EC 81 MG EC tablet Take 1 tablet (81 mg total) by mouth daily. Swallow whole. (Patient taking differently: Take 81 mg by mouth every morning. Swallow whole.) 30 tablet 2  ? Blood Glucose Monitoring Suppl (ACCU-CHEK GUIDE ME) w/Device KIT 1 Piece by Does not apply route as directed. 1 kit 0  ? glucose blood (ACCU-CHEK GUIDE) test strip Use as instructed 4 x daily. e11.65 150 each 2  ? glucose blood test strip 1 each  by Other route 2 (two) times daily. Use as instructed bid. E11.65 200 each 2  ? Insulin Pen Needle (PEN NEEDLES) 32G X 5 MM MISC Use 1 needle daily to inject insulin as instructed. 100 each 2  ? Lancets Thin MISC 1 each by Does not apply route 4 (four) times daily. E11.65 Pt has Prodigy meter 150 each 2  ?  ? ?Assessment: ?56 y.o. female with chest pain for heparin  ?Goal of Therapy:  ?Heparin level 0.3-0.7 units/ml ?Monitor platelets by anticoagulation protocol: Yes ?  ?Plan:  ?Heparin 4000 units IV bolus, then start heparin 1000 units/hr ?Check heparin level in 8 hours.  ? ?Finnegan Gatta, Bronson Curb ?06/24/2021,10:48 PM ? ? ?

## 2021-06-24 NOTE — ED Notes (Signed)
Pt placed on 2L of O2, O2 went from 90% to 96% ?

## 2021-06-24 NOTE — ED Notes (Signed)
Patient transported to X-ray 

## 2021-06-25 ENCOUNTER — Encounter (HOSPITAL_COMMUNITY): Payer: Self-pay | Admitting: Internal Medicine

## 2021-06-25 ENCOUNTER — Inpatient Hospital Stay (HOSPITAL_COMMUNITY): Payer: Medicare Other

## 2021-06-25 DIAGNOSIS — Z6834 Body mass index (BMI) 34.0-34.9, adult: Secondary | ICD-10-CM | POA: Diagnosis not present

## 2021-06-25 DIAGNOSIS — I1311 Hypertensive heart and chronic kidney disease without heart failure, with stage 5 chronic kidney disease, or end stage renal disease: Secondary | ICD-10-CM | POA: Diagnosis not present

## 2021-06-25 DIAGNOSIS — E782 Mixed hyperlipidemia: Secondary | ICD-10-CM

## 2021-06-25 DIAGNOSIS — K219 Gastro-esophageal reflux disease without esophagitis: Secondary | ICD-10-CM

## 2021-06-25 DIAGNOSIS — I420 Dilated cardiomyopathy: Secondary | ICD-10-CM | POA: Diagnosis present

## 2021-06-25 DIAGNOSIS — Z635 Disruption of family by separation and divorce: Secondary | ICD-10-CM | POA: Diagnosis not present

## 2021-06-25 DIAGNOSIS — N179 Acute kidney failure, unspecified: Secondary | ICD-10-CM | POA: Diagnosis present

## 2021-06-25 DIAGNOSIS — Z9851 Tubal ligation status: Secondary | ICD-10-CM | POA: Diagnosis not present

## 2021-06-25 DIAGNOSIS — I2510 Atherosclerotic heart disease of native coronary artery without angina pectoris: Secondary | ICD-10-CM | POA: Diagnosis present

## 2021-06-25 DIAGNOSIS — E1165 Type 2 diabetes mellitus with hyperglycemia: Secondary | ICD-10-CM

## 2021-06-25 DIAGNOSIS — Z7984 Long term (current) use of oral hypoglycemic drugs: Secondary | ICD-10-CM | POA: Diagnosis not present

## 2021-06-25 DIAGNOSIS — E1122 Type 2 diabetes mellitus with diabetic chronic kidney disease: Secondary | ICD-10-CM | POA: Diagnosis present

## 2021-06-25 DIAGNOSIS — I5043 Acute on chronic combined systolic (congestive) and diastolic (congestive) heart failure: Secondary | ICD-10-CM

## 2021-06-25 DIAGNOSIS — Z79899 Other long term (current) drug therapy: Secondary | ICD-10-CM | POA: Diagnosis not present

## 2021-06-25 DIAGNOSIS — I214 Non-ST elevation (NSTEMI) myocardial infarction: Secondary | ICD-10-CM

## 2021-06-25 DIAGNOSIS — I255 Ischemic cardiomyopathy: Secondary | ICD-10-CM | POA: Diagnosis present

## 2021-06-25 DIAGNOSIS — E039 Hypothyroidism, unspecified: Secondary | ICD-10-CM

## 2021-06-25 DIAGNOSIS — I132 Hypertensive heart and chronic kidney disease with heart failure and with stage 5 chronic kidney disease, or end stage renal disease: Secondary | ICD-10-CM | POA: Diagnosis present

## 2021-06-25 DIAGNOSIS — I10 Essential (primary) hypertension: Secondary | ICD-10-CM | POA: Diagnosis not present

## 2021-06-25 DIAGNOSIS — E669 Obesity, unspecified: Secondary | ICD-10-CM | POA: Diagnosis present

## 2021-06-25 DIAGNOSIS — Z7982 Long term (current) use of aspirin: Secondary | ICD-10-CM | POA: Diagnosis not present

## 2021-06-25 DIAGNOSIS — N185 Chronic kidney disease, stage 5: Secondary | ICD-10-CM

## 2021-06-25 DIAGNOSIS — E876 Hypokalemia: Secondary | ICD-10-CM | POA: Diagnosis present

## 2021-06-25 DIAGNOSIS — Z7902 Long term (current) use of antithrombotics/antiplatelets: Secondary | ICD-10-CM | POA: Diagnosis not present

## 2021-06-25 DIAGNOSIS — I252 Old myocardial infarction: Secondary | ICD-10-CM | POA: Diagnosis not present

## 2021-06-25 DIAGNOSIS — D649 Anemia, unspecified: Secondary | ICD-10-CM | POA: Diagnosis present

## 2021-06-25 LAB — PHOSPHORUS: Phosphorus: 5.1 mg/dL — ABNORMAL HIGH (ref 2.5–4.6)

## 2021-06-25 LAB — ECHOCARDIOGRAM LIMITED
Calc EF: 39.6 %
Height: 61 in
S' Lateral: 4.9 cm
Single Plane A2C EF: 40 %
Single Plane A4C EF: 37.8 %

## 2021-06-25 LAB — MAGNESIUM: Magnesium: 2.1 mg/dL (ref 1.7–2.4)

## 2021-06-25 LAB — GLUCOSE, CAPILLARY
Glucose-Capillary: 141 mg/dL — ABNORMAL HIGH (ref 70–99)
Glucose-Capillary: 181 mg/dL — ABNORMAL HIGH (ref 70–99)

## 2021-06-25 LAB — CBG MONITORING, ED
Glucose-Capillary: 129 mg/dL — ABNORMAL HIGH (ref 70–99)
Glucose-Capillary: 84 mg/dL (ref 70–99)

## 2021-06-25 LAB — HEPARIN LEVEL (UNFRACTIONATED)
Heparin Unfractionated: 0.12 IU/mL — ABNORMAL LOW (ref 0.30–0.70)
Heparin Unfractionated: 0.32 IU/mL (ref 0.30–0.70)

## 2021-06-25 LAB — TROPONIN I (HIGH SENSITIVITY)
Troponin I (High Sensitivity): 1522 ng/L (ref <18)
Troponin I (High Sensitivity): 5488 ng/L (ref <18)

## 2021-06-25 MED ORDER — CARVEDILOL 3.125 MG PO TABS
6.2500 mg | ORAL_TABLET | Freq: Two times a day (BID) | ORAL | Status: DC
  Administered 2021-06-25 – 2021-06-29 (×9): 6.25 mg via ORAL
  Filled 2021-06-25 (×9): qty 2

## 2021-06-25 MED ORDER — ISOSORBIDE MONONITRATE ER 30 MG PO TB24
15.0000 mg | ORAL_TABLET | Freq: Once | ORAL | Status: AC
  Administered 2021-06-25: 15 mg via ORAL
  Filled 2021-06-25: qty 1

## 2021-06-25 MED ORDER — EZETIMIBE 10 MG PO TABS
10.0000 mg | ORAL_TABLET | Freq: Every day | ORAL | Status: DC
  Administered 2021-06-25 – 2021-06-29 (×5): 10 mg via ORAL
  Filled 2021-06-25 (×5): qty 1

## 2021-06-25 MED ORDER — INSULIN ASPART 100 UNIT/ML IJ SOLN
0.0000 [IU] | INTRAMUSCULAR | Status: DC
  Administered 2021-06-25: 2 [IU] via SUBCUTANEOUS
  Administered 2021-06-25 (×2): 1 [IU] via SUBCUTANEOUS
  Administered 2021-06-26: 2 [IU] via SUBCUTANEOUS
  Administered 2021-06-26: 5 [IU] via SUBCUTANEOUS
  Administered 2021-06-26: 1 [IU] via SUBCUTANEOUS
  Administered 2021-06-26 – 2021-06-27 (×3): 3 [IU] via SUBCUTANEOUS
  Administered 2021-06-27: 1 [IU] via SUBCUTANEOUS
  Administered 2021-06-28 (×2): 2 [IU] via SUBCUTANEOUS
  Administered 2021-06-28: 1 [IU] via SUBCUTANEOUS
  Administered 2021-06-28: 2 [IU] via SUBCUTANEOUS
  Administered 2021-06-28: 1 [IU] via SUBCUTANEOUS
  Administered 2021-06-29: 2 [IU] via SUBCUTANEOUS
  Filled 2021-06-25: qty 1

## 2021-06-25 MED ORDER — FUROSEMIDE 10 MG/ML IJ SOLN
40.0000 mg | Freq: Two times a day (BID) | INTRAMUSCULAR | Status: DC
  Administered 2021-06-25 – 2021-06-28 (×7): 40 mg via INTRAVENOUS
  Filled 2021-06-25 (×7): qty 4

## 2021-06-25 MED ORDER — CLOPIDOGREL BISULFATE 75 MG PO TABS
75.0000 mg | ORAL_TABLET | Freq: Every day | ORAL | Status: DC
  Administered 2021-06-25 – 2021-06-29 (×5): 75 mg via ORAL
  Filled 2021-06-25 (×5): qty 1

## 2021-06-25 MED ORDER — ATORVASTATIN CALCIUM 40 MG PO TABS
80.0000 mg | ORAL_TABLET | Freq: Every day | ORAL | Status: DC
  Administered 2021-06-25 – 2021-06-28 (×4): 80 mg via ORAL
  Filled 2021-06-25 (×4): qty 2

## 2021-06-25 MED ORDER — ISOSORBIDE MONONITRATE ER 30 MG PO TB24
15.0000 mg | ORAL_TABLET | Freq: Every day | ORAL | Status: DC
  Administered 2021-06-25: 15 mg via ORAL
  Filled 2021-06-25: qty 1

## 2021-06-25 MED ORDER — ISOSORBIDE MONONITRATE ER 60 MG PO TB24
30.0000 mg | ORAL_TABLET | Freq: Every day | ORAL | Status: DC
  Administered 2021-06-26 – 2021-06-29 (×4): 30 mg via ORAL
  Filled 2021-06-25 (×4): qty 1

## 2021-06-25 MED ORDER — FAMOTIDINE 20 MG PO TABS
20.0000 mg | ORAL_TABLET | Freq: Every day | ORAL | Status: DC
Start: 2021-06-25 — End: 2021-06-29
  Administered 2021-06-25 – 2021-06-29 (×5): 20 mg via ORAL
  Filled 2021-06-25 (×5): qty 1

## 2021-06-25 MED ORDER — CLOPIDOGREL BISULFATE 75 MG PO TABS: 75.0000 mg | ORAL_TABLET | Freq: Every day | ORAL | Status: DC

## 2021-06-25 MED ORDER — ASPIRIN EC 81 MG PO TBEC
81.0000 mg | DELAYED_RELEASE_TABLET | Freq: Every day | ORAL | Status: DC
  Administered 2021-06-25 – 2021-06-29 (×5): 81 mg via ORAL
  Filled 2021-06-25 (×5): qty 1

## 2021-06-25 NOTE — Consult Note (Signed)
?Cardiology Consultation:  ? ?Patient ID: [name] ?MRN: [id number]; DOB: 1965/06/08 ? ?Admit date: 06/24/2021 ?Date of Consult: 06/25/2021 ? ?PCP:  [facility] ?  ?[facility] Providers ?Cardiologist:  Previously Dr. Bronson Ing --> Was scheduled to switch to Dr. Radford Pax in 04/2021 but was hospitalized at that time.  ? ?Patient Profile:  ? ?Monica Carlson is a 56 y.o. female with a hx of CAD (s/p cath in 02/2021 showing multivessel CAD and not a candidate for PCI and not a candidate for CABG at that time), HFmrEF (EF 40-45% in 02/2021), HTN, HLD, Type 2 DM, Stage 4-5 CKD, anemia and history of CVA who is being seen 06/25/2021 for the evaluation of NSTEMI at the request of Dr. Josephine Cables. ? ?History of Present Illness:  ? ?[name] was admitted from 3/1 - 04/23/2021 for a recurrent NSTEMI with peak Hs Troponin at 845 and medical management was pursued. Was again admitted from 3/12 - 05/05/2021 and Hs Troponin was elevated to 529 but not trended to peak. Dr. Burt Knack mentioned in his notes that  she had diffuse disease with poor targets and given her comorbidities, she was not currently nor would be a candidate for CABG and was not a PCI candidate. She was treated for a CHF exacerbation and responded well with IV Lasix and transitioned to Torsemide 76m daily at discharge. She was continued on ASA, Lipitor 867mdaily, Coreg 6.2518mID, Plavix 65m27mily, Zetia 10mg48mly and Imdur 15mg 47my.  ? ?She presented back to the ED on 06/24/2021 for evaluation of left arm pain. In talking with the patient today, she reports she had overall been doing well since her last admission until yesterday when she developed significant left arm pain. Says it felt like she was carrying many grocery bags. The pain did not radiate into her chest. No associated dyspnea on exertion, nausea, vomiting or diaphoresis. No recent orthopnea, PND or pitting edema. She did see her Nephrologist (Dr. BhutanTheador Hawthornently and was  informed her kidney function was stable.  ? ?Initial labs show WBC 10.0, Hgb 10.3, platelets 300, Na+ 136, K+ 4.3 and creatinine 3.61 (baseline 3.4 - 3.5). Initial Hs Troponin 29 with repeat values of 311, 1522 and 5488. CXR showing mild CHF. EKG shows NSR, HR 84 with diffuse TWI along the inferior and lateral leads which is similar to prior tracings.  ? ? ?Past Medical History:  ?Diagnosis Date  ? Cervical cancer (HCC)  [place]; 1991  ? CHF (congestive heart failure) (HCC) [id number]  ? Diabetes mellitus without complication (HCC) 2[place]  9629gh cholesterol   ? Hypertension   ? MI (myocardial infarction) (HCC) [facility]2004  ? Stroke (HCC) [facility]16, 2013  ? Vaginal Pap smear, abnormal   ? ? ?Past Surgical History:  ?Procedure Laterality Date  ? CHOLECYSTECTOMY  1992  ? CORONARY STENT PLACEMENT  2004  ? LEFT HEART CATH AND CORONARY ANGIOGRAPHY N/A 03/23/2021  ? Procedure: LEFT HEART CATH AND CORONARY ANGIOGRAPHY;  Surgeon: VaranaJettie Booze [facility];  Service: Cardiovascular;  Laterality: N/A;  ? TUBAL LIGATION    ?  ? ?Home Medications:  ?Prior to Admission medications   ?Medication Sig Start Date End Date Taking? Authorizing Provider  ?acetaminophen (TYLENOL) 500 MG tablet Take 1,000-1,500 mg by mouth every 6 (six) hours as needed (migraine).   Yes [provider]  ?albuterol (VENTOLIN HFA) 108 (90 Base) MCG/ACT inhaler Inhale 2  puffs into the lungs every 4 (four) hours as needed for shortness of breath. 02/27/21  Yes [provider]  ?aspirin EC 81 MG tablet Take 81 mg by mouth daily. Swallow whole.   Yes [provider]  ?atorvastatin (LIPITOR) 80 MG tablet Take 1 tablet (80 mg total) by mouth at bedtime. 03/24/21 06/24/21 Yes [place] Donnamarie Poag, DO  ?carvedilol (COREG) 6.25 MG tablet Take 6.25 mg by mouth 2 (two) times daily. 02/09/21  Yes [provider]  ?clopidogrel (PLAVIX) 75 MG tablet TAKE ONE TABLET BY MOUTH EVERY DAY WITH  BREAKFAST ?Patient taking differently: Take 75 mg by mouth daily with breakfast. 07/10/17  Yes Hagler, Apolonio Schneiders, MD  ?ezetimibe (ZETIA) 10 MG tablet Take 1 tablet (10 mg total) by mouth daily. 04/15/21  Yes Sueanne Margarita, MD  ?famotidine (PEPCID) 20 MG tablet Take by mouth. 06/16/21 06/16/22 Yes [provider]  ?gabapentin (NEURONTIN) 300 MG capsule Take 300 mg by mouth 2 (two) times daily. 10/11/17  Yes [provider]  ?glipiZIDE (GLUCOTROL) 10 MG tablet Take 10 mg by mouth 2 (two) times daily before a meal.   Yes [provider]  ?icosapent Ethyl (VASCEPA) 1 g capsule Take 2 capsules (2 g total) by mouth 2 (two) times daily. With food 04/15/21  Yes Turner, Traci R, MD  ?insulin detemir (LEVEMIR) 100 UNIT/ML FlexPen Inject 15 Units into the skin at bedtime. 04/23/21  Yes Barton Dubois, MD  ?isosorbide mononitrate (IMDUR) 30 MG 24 hr tablet Take 0.5 tablets (15 mg total) by mouth daily. 04/24/21  Yes Barton Dubois, MD  ?JANUVIA 50 MG tablet TAKE 1 TABLET BY MOUTH EVERY DAY ?Patient taking differently: Take 50 mg by mouth every morning. 02/23/21  Yes Brita Romp, NP  ?levothyroxine (SYNTHROID) 125 MCG tablet TAKE 1 TABLET BY MOUTH DAILY BEFORE BREAKFAST ?Patient taking differently: Take 125 mcg by mouth daily before breakfast. 02/23/21  Yes Brita Romp, NP  ?nitroGLYCERIN (NITROSTAT) 0.4 MG SL tablet Place 1 tablet (0.4 mg total) under the tongue every 5 (five) minutes as needed for chest pain. 08/15/17  Yes Caren Macadam, MD  ?potassium chloride SA (KLOR-CON M) 20 MEQ tablet Take 20 mEq by mouth 2 (two) times daily. 06/10/21  Yes [provider]  ?torsemide (DEMADEX) 20 MG tablet Take 2 tablets (40 mg total) by mouth daily. 05/06/21  Yes Domenic Polite, MD  ?traZODone (DESYREL) 50 MG tablet Take 50 mg by mouth at bedtime as needed. 06/08/21  Yes [provider]  ?Blood Glucose Monitoring Suppl (ACCU-CHEK GUIDE ME) w/Device KIT 1 Piece by Does not apply route as  directed. 07/09/18   Cassandria Anger, MD  ?glucose blood (ACCU-CHEK GUIDE) test strip Use as instructed 4 x daily. e11.65 07/10/18   Cassandria Anger, MD  ?glucose blood test strip 1 each by Other route 2 (two) times daily. Use as instructed bid. E11.65 12/18/18   Cassandria Anger, MD  ?Insulin Pen Needle (PEN NEEDLES) 32G X 5 MM MISC Use 1 needle daily to inject insulin as instructed. 04/23/21   Barton Dubois, MD  ?Lancets Thin MISC 1 each by Does not apply route 4 (four) times daily. E11.65 Pt has Prodigy meter 10/02/18   Cassandria Anger, MD  ? ? ?Inpatient Medications: ?Scheduled Meds: ? aspirin EC  81 mg Oral Daily  ? atorvastatin  80 mg Oral QHS  ? carvedilol  6.25 mg Oral BID  ? clopidogrel  75 mg Oral Q breakfast  ? ezetimibe  10 mg Oral Daily  ? furosemide  40 mg Intravenous Q12H  ? glucose blood  1 each Other BID  ? insulin aspart  0-9 Units Subcutaneous Q4H  ? isosorbide mononitrate  15 mg Oral Daily  ? ?Continuous Infusions: ? heparin 1,200 Units/hr (06/25/21 0933)  ? ?PRN Meds: ? ? ?Allergies:    ?Allergies  ?Allergen Reactions  ? Cedar Swelling  ?   ?  ? Other Swelling  ?  Teriyaki  ? ? ?Social History:   ?Social History  ? ?Socioeconomic History  ? Marital status: Legally Separated  ?  Spouse name: Not on file  ? Number of children: Not on file  ? Years of education: Not on file  ? Highest education level: Not on file  ?Occupational History  ? Not on file  ?Tobacco Use  ? Smoking status: Never  ? Smokeless tobacco: Never  ?Vaping Use  ? Vaping Use: Never used  ?Substance and Sexual Activity  ? Alcohol use: No  ? Drug use: No  ? Sexual activity: Yes  ?  Birth control/protection: None, Surgical  ?  Comment: tubal  ?Other Topics Concern  ? Not on file  ?Social History Narrative  ? Lives alone.   ? Has some friends.   ? Neighbors.   ? ?Social Determinants of Health  ? ?Financial Resource Strain: Not on file  ?Food Insecurity: Not on file  ?Transportation Needs: Not on file  ?Physical  Activity: Not on file  ?Stress: Not on file  ?Social Connections: Not on file  ?Intimate Partner Violence: Not on file  ?  ?Family History:   ? ?Family History  ?Problem Relation Age of Onset  ? Migraines Mother   ? [place]

## 2021-06-25 NOTE — H&P (Signed)
?History and Physical  ? ? ?Patient: Monica Carlson EHM:[id number] DOB: 27-May-1965 ?DOA: 06/24/2021 ?DOS: the patient was seen and examined on 06/25/2021 ?[facility]  ?Patient coming from: Home ? ?Chief Complaint: No chief complaint on file. ? ?HPI: Monica Carlson is a 56 y.o. female with medical history significant of T2DM, hypertension, hyperlipidemia, chronic systolic/diastolic heart failure, CAD, CKD stage V (baseline creatinine at 3.2-3.6) who presents to the emergency department due to sudden onset of left arm/left upper chest pain which started about 30 minutes prior to arrival to the ED yesterday.  Patient states that she took 4 baby aspirin and tried to rest, but the pain persisted, so she activated EMS and patient was sent to the ED for further evaluation and management. ?Patient was recently admitted from 3/12 to 05/05/2021 due to NSTEMI. Patient was not deemed to be a candidate for CABG due to comorbidities and she was being treated conservatively with medical management. ?She was also admitted from 3/1 to 04/23/2021 due to chest pain. ? ?ED Course:  ?In the emergency department, she was symptomatically tachypneic, BP was 148/90, O2 sat was 90% on room air, this improved to 96%.  The oxygen via [place] at 2 LPM.  Work-up in the ED showed normocytic anemia, hyperglycemia, BUN/creatinine 37/3.619 (baseline creatinine 3.2-3.6).  High-sensitivity troponin 311 > 1522> 5,488.  She denies any chest pain or pain in the left arm at this time. ?Chest x-ray showed changes of mild CHF ?Patient was placed on heparin drip, IV morphine 2 mg x 1 was given.  Hospitalist was asked to admit patient for further evaluation and management. ? ?Review of Systems: ?Review of systems as noted in the HPI. All other systems reviewed and are negative. ? ? ?Past Medical History:  ?Diagnosis Date  ? Cervical cancer ([place])   ? cryotherapy and colposcopy/[place], [place]; 1991  ? CHF (congestive heart failure) ([place])  07-2014  ? Diabetes mellitus without complication ([place]) 3662  ? High cholesterol   ? Hypertension   ? MI (myocardial infarction) [facility])   ? 2004  ? Stroke [facility]) 09/2014, 2013  ? Vaginal Pap smear, abnormal   ? ?Past Surgical History:  ?Procedure Laterality Date  ? CHOLECYSTECTOMY  1992  ? CORONARY STENT PLACEMENT  2004  ? LEFT HEART CATH AND CORONARY ANGIOGRAPHY N/A 03/23/2021  ? Procedure: LEFT HEART CATH AND CORONARY ANGIOGRAPHY;  Surgeon: Jettie Booze, MD;  Location: [facility];  Service: Cardiovascular;  Laterality: N/A;  ? TUBAL LIGATION    ? ? ?Social History:  reports that she has never smoked. She has never used smokeless tobacco. She reports that she does not drink alcohol and does not use drugs. ? ? ?Allergies  ?Allergen Reactions  ? Cedar Swelling  ?   ?  ? Other Swelling  ?  Teriyaki  ? ? ?Family History  ?Problem Relation Age of Onset  ? Migraines Mother   ? Heart disease Father   ? Hypertension Father   ? Diabetes Father   ? Migraines Maternal Grandfather   ? Heart attack Maternal Grandfather   ? Lung cancer Paternal Grandmother   ? Colon cancer Neg Hx   ? Colon polyps Neg Hx   ?  ? ?Prior to Admission medications   ?Medication Sig Start Date End Date Taking? Authorizing Provider  ?acetaminophen (TYLENOL) 500 MG tablet Take 1,000-1,500 mg by mouth every 6 (six) hours as needed (migraine).   Yes [provider]  ?albuterol (VENTOLIN  HFA) 108 (90 Base) MCG/ACT inhaler Inhale 2 puffs into the lungs every 4 (four) hours as needed for shortness of breath. 02/27/21  Yes [provider]  ?aspirin EC 81 MG tablet Take 81 mg by mouth daily. Swallow whole.   Yes [provider]  ?atorvastatin (LIPITOR) 80 MG tablet Take 1 tablet (80 mg total) by mouth at bedtime. 03/24/21 06/24/21 Yes [place] Donnamarie Poag, DO  ?carvedilol (COREG) 6.25 MG tablet Take 6.25 mg by mouth 2 (two) times daily. 02/09/21  Yes [provider]  ?clopidogrel (PLAVIX) 75 MG tablet TAKE ONE TABLET BY  MOUTH EVERY DAY WITH BREAKFAST ?Patient taking differently: Take 75 mg by mouth daily with breakfast. 07/10/17  Yes Hagler, Apolonio Schneiders, MD  ?ezetimibe (ZETIA) 10 MG tablet Take 1 tablet (10 mg total) by mouth daily. 04/15/21  Yes Sueanne Margarita, MD  ?famotidine (PEPCID) 20 MG tablet Take by mouth. 06/16/21 06/16/22 Yes [provider]  ?gabapentin (NEURONTIN) 300 MG capsule Take 300 mg by mouth 2 (two) times daily. 10/11/17  Yes [provider]  ?glipiZIDE (GLUCOTROL) 10 MG tablet Take 10 mg by mouth 2 (two) times daily before a meal.   Yes [provider]  ?icosapent Ethyl (VASCEPA) 1 g capsule Take 2 capsules (2 g total) by mouth 2 (two) times daily. With food 04/15/21  Yes Turner, Traci R, MD  ?insulin detemir (LEVEMIR) 100 UNIT/ML FlexPen Inject 15 Units into the skin at bedtime. 04/23/21  Yes Barton Dubois, MD  ?isosorbide mononitrate (IMDUR) 30 MG 24 hr tablet Take 0.5 tablets (15 mg total) by mouth daily. 04/24/21  Yes Barton Dubois, MD  ?JANUVIA 50 MG tablet TAKE 1 TABLET BY MOUTH EVERY DAY ?Patient taking differently: Take 50 mg by mouth every morning. 02/23/21  Yes Brita Romp, NP  ?levothyroxine (SYNTHROID) 125 MCG tablet TAKE 1 TABLET BY MOUTH DAILY BEFORE BREAKFAST ?Patient taking differently: Take 125 mcg by mouth daily before breakfast. 02/23/21  Yes Brita Romp, NP  ?nitroGLYCERIN (NITROSTAT) 0.4 MG SL tablet Place 1 tablet (0.4 mg total) under the tongue every 5 (five) minutes as needed for chest pain. 08/15/17  Yes Caren Macadam, MD  ?potassium chloride SA (KLOR-CON M) 20 MEQ tablet Take 20 mEq by mouth 2 (two) times daily. 06/10/21  Yes [provider]  ?torsemide (DEMADEX) 20 MG tablet Take 2 tablets (40 mg total) by mouth daily. 05/06/21  Yes Domenic Polite, MD  ?traZODone (DESYREL) 50 MG tablet Take 50 mg by mouth at bedtime as needed. 06/08/21  Yes [provider]  ?Blood Glucose Monitoring Suppl (ACCU-CHEK GUIDE ME) w/Device KIT 1 Piece by Does not  apply route as directed. 07/09/18   Cassandria Anger, MD  ?glucose blood (ACCU-CHEK GUIDE) test strip Use as instructed 4 x daily. e11.65 07/10/18   Cassandria Anger, MD  ?glucose blood test strip 1 each by Other route 2 (two) times daily. Use as instructed bid. E11.65 12/18/18   Cassandria Anger, MD  ?Insulin Pen Needle (PEN NEEDLES) 32G X 5 MM MISC Use 1 needle daily to inject insulin as instructed. 04/23/21   Barton Dubois, MD  ?Lancets Thin MISC 1 each by Does not apply route 4 (four) times daily. E11.65 Pt has Prodigy meter 10/02/18   Cassandria Anger, MD  ? ? ?Physical Exam: ?BP 129/84   Pulse 71   Temp 98.4 ?F (36.9 ?C) (Oral)   Resp 14   Ht _0  (1.549 m)   SpO2 98%  BMI 34.41 kg/m?  ? ?General: 56 y.o. year-old female well developed well nourished in no acute distress.  Alert and oriented x3. ?HEENT: NCAT, EOMI ?Neck: Supple, trachea medial ?Cardiovascular: Regular rate and rhythm with no rubs or gallops.  No thyromegaly or JVD noted.  No lower extremity edema. 2/4 pulses in all 4 extremities. ?Respiratory: Clear to auscultation with no wheezes or rales. Good inspiratory effort. ?Abdomen: Soft, nontender nondistended with normal bowel sounds x4 quadrants. ?Muskuloskeletal: No cyanosis, clubbing or edema noted bilaterally ?Neuro: CN II-XII intact, strength 5/5 x 4, sensation, reflexes intact ?Skin: No ulcerative lesions noted or rashes ?Psychiatry: Judgement and insight appear normal. Mood is appropriate for condition and setting ?   ?   ?   ?Labs on Admission:  ?Basic Metabolic Panel: ?Recent Labs  ?Lab 06/24/21 ?1851  ?NA 136  ?K 4.3  ?CL 107  ?CO2 21*  ?GLUCOSE 357*  ?BUN 37*  ?CREATININE 3.61*  ?CALCIUM 8.6*  ? ?Liver Function Tests: ?No results for input(s): AST, ALT, ALKPHOS, BILITOT, PROT, ALBUMIN in the last 168 hours. ?No results for input(s): LIPASE, AMYLASE in the last 168 hours. ?No results for input(s): AMMONIA in the last 168 hours. ?CBC: ?Recent Labs  ?Lab  06/24/21 ?1851  ?WBC 10.0  ?HGB 10.3*  ?HCT 33.1*  ?MCV 99.7  ?PLT 300  ? ?Cardiac Enzymes: ?No results for input(s): CKTOTAL, CKMB, CKMBINDEX, TROPONINI in the last 168 hours. ? ?BNP (last 3 results) ?Recent Labs  ?  04/21/21 ?0

## 2021-06-25 NOTE — TOC Initial Note (Signed)
Transition of Care (TOC) - Initial/Assessment Note  ? ? ?Patient Details  ?Name: Monica Carlson ?MRN: [id number] ?Date of Birth: 1965-04-17 ? ?Transition of Care [facility]) CM/SW Contact:    ?Joaquin Courts, RN ?Phone Number: ?06/25/2021, 2:28 PM ? ?Clinical Narrative:                 ?CM spoke with patient regarding discharge planning, patient reports she lives in an apartment and receives aide services through the [facility] program. She has an aide daily during the week and her boyfriend is in the home with her on the weekends.  Per patient she sees Dr Marlon Pel as her pcp and has meds delivered to the home from [facility], patient reports she has no transportation issues and plans to return home at discharge, boyfriend to provide ride.  No TOC needs noted at this time.  ? ?Expected Discharge Plan: Home/Self Care ?Barriers to Discharge: Continued Medical Work up ? ? ?Patient Goals and CMS Choice ?Patient states their goals for this hospitalization and ongoing recovery are:: to return home ?  ?  ? ?Expected Discharge Plan and Services ?Expected Discharge Plan: Home/Self Care ?  ?Discharge Planning Services: CM Consult ?  ?Living arrangements for the past 2 months: Apartment ?                ?DME Arranged: N/A ?DME Agency: NA ?  ?  ?  ?HH Arranged: NA ?  ?  ?  ?  ? ?Prior Living Arrangements/Services ?Living arrangements for the past 2 months: Apartment ?Lives with:: Self, Significant Other ?Patient language and need for interpreter reviewed:: Yes ?Do you feel safe going back to the place where you live?: Yes      ?Need for Family Participation in Patient Care: Yes (Comment) ?Care giver support system in place?: Yes (comment) ?  ?Criminal Activity/Legal Involvement Pertinent to Current Situation/Hospitalization: No - Comment as needed ? ?Activities of Daily Living ?  ?  ? ?Permission Sought/Granted ?  ?  ?   ?   ?   ?   ? ?Emotional Assessment ?Appearance:: Appears stated age ?Attitude/Demeanor/Rapport: Engaged ?Affect  (typically observed): Accepting ?Orientation: : Oriented to Self, Oriented to Place, Oriented to  Time, Oriented to Situation ?  ?Psych Involvement: No (comment) ? ?Admission diagnosis:  NSTEMI (non-ST elevated myocardial infarction) ([place]) [I21.4] ?Patient Active Problem List  ? Diagnosis Date Noted  ? Benign hypertensive heart and kidney disease and CKD stage V ([place]) 06/25/2021  ? Mixed hyperlipidemia 06/25/2021  ? GERD (gastroesophageal reflux disease) 06/25/2021  ? CKD (chronic kidney disease), stage IV ([place]) 04/22/2021  ? Coronary artery disease 04/21/2021  ? Prolonged QT interval 04/21/2021  ? AKI (acute kidney injury) ([place])   ? Acute myocardial infarction [facility])   ? NSTEMI (non-ST elevated myocardial infarction) ([place]) 03/19/2021  ? IDA (iron deficiency anemia) 06/25/2019  ? Vitamin D deficiency 10/17/2018  ? Diabetes mellitus without complication ([place]) 14/48/1856  ? Encounter for screening colonoscopy 03/21/2017  ? Hypertension   ? High cholesterol   ? Acute ischemic stroke ([place]) 10/11/2016  ? Severe left ventricular systolic dysfunction   ? History of right MCA stroke 10/10/2016  ? Dysphagia   ? Acute systolic congestive heart failure ([place])   ? Acute on chronic combined systolic and diastolic CHF (congestive heart failure) ([place]) 10/08/2016  ? Acute on chronic diastolic CHF (congestive heart failure) ([place]) 10/07/2016  ? Uncontrolled type 2 diabetes mellitus with hyperglycemia ([place]) 10/07/2016  ? Essential hypertension 10/07/2016  ?  Personal history of noncompliance with medical treatment, presenting hazards to health 12/26/2015  ? Hypothyroidism 05/30/2015  ? Uncontrolled type 2 diabetes mellitus with complication 22/03/5425  ? Coronary artery disease involving native coronary artery of native heart without angina pectoris 05/04/2015  ? Essential hypertension, benign 05/04/2015  ? Class 2 obesity 05/04/2015  ? Stented coronary artery 05/04/2015  ? History of MI (myocardial infarction) 05/04/2015  ? Hypertensive  cardiomyopathy ([place]) 05/04/2015  ? ?PCP:  [facility] ?Pharmacy:   ?[facility], [address] ?[place] [address] ?Phone: [phone] Fax: [phone] ? ? ? ? ?Social Determinants of Health (SDOH) Interventions ?  ? ?Readmission Risk Interventions ? ?  06/25/2021  ?  2:27 PM 05/04/2021  ?  2:36 PM  ?Readmission Risk Prevention Plan  ?Transportation Screening Complete Complete  ?PCP or Specialist Appt within 3-5 Days  Complete  ?[place] or Home Care Consult  Complete  ?Social Work Consult for [place] Planning/Counseling  Complete  ?Palliative Care Screening  Not Applicable  ?Medication Review Press photographer) Complete Complete  ?[place] or Home Care Consult Complete   ?SW Recovery Care/Counseling Consult Complete   ?Palliative Care Screening Not Applicable   ?[place] Not Applicable   ? ? ? ?

## 2021-06-25 NOTE — Progress Notes (Signed)
?  PROGRESS NOTE ? ?Patient admitted earlier this morning. See H&P.  ? ?SABA GOMM is a 56 y.o. female with medical history significant of T2DM, hypertension, hyperlipidemia, chronic systolic/diastolic heart failure, CAD, CKD stage V (baseline creatinine at 3.2-3.6) who presents to the emergency department due to sudden onset of left arm/left upper chest pain which started about 30 minutes prior to arrival to the ED.  ? ?She was recently hospitalized due to NSTEMI.  She underwent heart cath and was deemed not to be a candidate for CABG or PCI.  Had recommended for medical management at that time.  Now she presents with elevated troponin up to 5488. ? ?Patient was seen in the emergency department.  Her complaints of left arm pain as well as shortness of breath has completely resolved at this time. ? ?Continue aspirin, Plavix, Lipitor, Zetia, Coreg, Imdur, Lasix ?Echocardiogram shows EF 35 to 40%, moderately decreased function of left ventricle, akinesis of left ventricular apical segment, basal inferior wall, apical anterior wall ?Cardiology consulted ?Continue IV heparin ? ? ?Status is: Inpatient ?Remains inpatient appropriate because: Cardiology consulted, IV heparin ? ? ?Dessa Phi, DO ?[facility] ?06/25/2021, 12:03 PM ? ?Available via Epic secure chat 7am-7pm ?After these hours, please refer to coverage provider listed on [email or web address]  ? ?

## 2021-06-25 NOTE — Progress Notes (Signed)
*  PRELIMINARY RESULTS* ?Echocardiogram ?Limited 2D Echocardiogram has been performed. ? ?Monica Carlson ?06/25/2021, 10:57 AM ?

## 2021-06-25 NOTE — Progress Notes (Signed)
ANTICOAGULATION CONSULT NOTE - Follow Up Consult ? ?Pharmacy Consult for heparin ?Indication: chest pain/ACS ? ?Allergies  ?Allergen Reactions  ? [place] Swelling  ?   ?  ? Other Swelling  ?  Teriyaki  ? ? ?Patient Measurements: ?Height: '5\' 1"'$  (154.9 cm) ?Weight: 83.8 kg (184 lb 11.9 oz) ?IBW/kg (Calculated) : 47.8 ?Heparin Dosing Weight: 70 kg ? ?Vital Signs: ?Temp: 98.1 ?F (36.7 ?C) (05/05 1852) ?Temp Source: Oral (05/05 1852) ?BP: 101/57 (05/05 1852) ?Pulse Rate: 70 (05/05 1852) ? ?Labs: ?Recent Labs  ?  06/24/21 ?1851 06/24/21 ?2111 06/24/21 ?2300 06/25/21 ?0127 06/25/21 ?0755 06/25/21 ?1827  ?HGB 10.3*  --   --   --   --   --   ?HCT 33.1*  --   --   --   --   --   ?PLT 300  --   --   --   --   --   ?HEPARINUNFRC  --   --   --   --  0.12* 0.32  ?CREATININE 3.61*  --   --   --   --   --   ?TROPONINIHS 29* 311* 1,522* 5,488*  --   --   ? ? ?Estimated Creatinine Clearance: 17.1 mL/min (A) (by C-G formula based on SCr of 3.61 mg/dL (H)). ? ? ?Medications:  ?Scheduled:  ? aspirin EC  81 mg Oral Daily  ? atorvastatin  80 mg Oral QHS  ? carvedilol  6.25 mg Oral BID  ? clopidogrel  75 mg Oral Q breakfast  ? ezetimibe  10 mg Oral Daily  ? famotidine  20 mg Oral Daily  ? furosemide  40 mg Intravenous Q12H  ? insulin aspart  0-9 Units Subcutaneous Q4H  ? [START ON 06/26/2021] isosorbide mononitrate  30 mg Oral Daily  ? ?Infusions:  ? heparin 1,200 Units/hr (06/25/21 0933)  ? ? ?Assessment: ?56 yo female with chest pain started on heparin gtt. Heparin level came back therapeutic at 0.32 units/ml. ? ?Goal of Therapy:  ?Heparin level 0.3-0.7 units/ml ?Monitor platelets by anticoagulation protocol: Yes ?  ?Plan:  ?Continue heparin drip at 1200 units/hr (12 mL/hr) ?Recheck heparin level in 6-8 hours since first therapeutic level ? ?Monica Carlson ?06/25/2021,7:22 PM ? ? ?

## 2021-06-25 NOTE — Progress Notes (Signed)
ANTICOAGULATION CONSULT NOTE - Initial Consult ? ?Pharmacy Consult for Heparin ?Indication: chest pain/ACS ? ?Patient Measurements: ?Height: '5\' 1"'$  (154.9 cm) ?IBW/kg (Calculated) : 47.8 ?Heparin Dosing Weight: 70 kg ? ?Labs: ?Recent Labs  ?  06/24/21 ?1851 06/24/21 ?2111 06/24/21 ?2300 06/25/21 ?0127 06/25/21 ?7619  ?HGB 10.3*  --   --   --   --   ?HCT 33.1*  --   --   --   --   ?PLT 300  --   --   --   --   ?HEPARINUNFRC  --   --   --   --  0.12*  ?CREATININE 3.61*  --   --   --   --   ?TROPONINIHS 29* 311* 1,522* 5,488*  --   ? ? ?CrCl cannot be calculated (Unknown ideal weight.). ? ?Assessment: ?56 y.o. female with chest pain for heparin, initial level is subtherapeutic. No adverse events reported. ? ?Goal of Therapy:  ?Heparin level 0.3-0.7 units/ml ?Monitor platelets by anticoagulation protocol: Yes ?  ?Plan:  ?Increase heparin gtt to 1200 units/hr = 12 ml/hr ?Check heparin level in 8 hours.  ? ?Lorenso Courier, PharmD ?Clinical Pharmacist ?06/25/2021 9:15 AM ? ? ? ? ? ? ? ?

## 2021-06-26 LAB — GLUCOSE, CAPILLARY
Glucose-Capillary: 101 mg/dL — ABNORMAL HIGH (ref 70–99)
Glucose-Capillary: 115 mg/dL — ABNORMAL HIGH (ref 70–99)
Glucose-Capillary: 119 mg/dL — ABNORMAL HIGH (ref 70–99)
Glucose-Capillary: 127 mg/dL — ABNORMAL HIGH (ref 70–99)
Glucose-Capillary: 163 mg/dL — ABNORMAL HIGH (ref 70–99)
Glucose-Capillary: 219 mg/dL — ABNORMAL HIGH (ref 70–99)
Glucose-Capillary: 245 mg/dL — ABNORMAL HIGH (ref 70–99)

## 2021-06-26 LAB — COMPREHENSIVE METABOLIC PANEL
ALT: 9 U/L (ref 0–44)
AST: 16 U/L (ref 15–41)
Albumin: 3.1 g/dL — ABNORMAL LOW (ref 3.5–5.0)
Alkaline Phosphatase: 92 U/L (ref 38–126)
Anion gap: 7 (ref 5–15)
BUN: 38 mg/dL — ABNORMAL HIGH (ref 6–20)
CO2: 23 mmol/L (ref 22–32)
Calcium: 8.8 mg/dL — ABNORMAL LOW (ref 8.9–10.3)
Chloride: 109 mmol/L (ref 98–111)
Creatinine, Ser: 3.49 mg/dL — ABNORMAL HIGH (ref 0.44–1.00)
GFR, Estimated: 15 mL/min — ABNORMAL LOW (ref >60)
Glucose, Bld: 103 mg/dL — ABNORMAL HIGH (ref 70–99)
Potassium: 3.4 mmol/L — ABNORMAL LOW (ref 3.5–5.1)
Sodium: 139 mmol/L (ref 135–145)
Total Bilirubin: 0.4 mg/dL (ref 0.3–1.2)
Total Protein: 6.6 g/dL (ref 6.5–8.1)

## 2021-06-26 LAB — LIPID PANEL
Cholesterol: 189 mg/dL (ref 0–200)
HDL: 27 mg/dL — ABNORMAL LOW (ref >40)
LDL Cholesterol: 120 mg/dL — ABNORMAL HIGH (ref 0–99)
Total CHOL/HDL Ratio: 7 RATIO
Triglycerides: 210 mg/dL — ABNORMAL HIGH (ref <150)
VLDL: 42 mg/dL — ABNORMAL HIGH (ref 0–40)

## 2021-06-26 LAB — APTT: aPTT: 48 seconds — ABNORMAL HIGH (ref 24–36)

## 2021-06-26 LAB — CBC
HCT: 30.8 % — ABNORMAL LOW (ref 36.0–46.0)
Hemoglobin: 9.4 g/dL — ABNORMAL LOW (ref 12.0–15.0)
MCH: 30.3 pg (ref 26.0–34.0)
MCHC: 30.5 g/dL (ref 30.0–36.0)
MCV: 99.4 fL (ref 80.0–100.0)
Platelets: 273 10*3/uL (ref 150–400)
RBC: 3.1 MIL/uL — ABNORMAL LOW (ref 3.87–5.11)
RDW: 14 % (ref 11.5–15.5)
WBC: 10.8 10*3/uL — ABNORMAL HIGH (ref 4.0–10.5)
nRBC: 0 % (ref 0.0–0.2)

## 2021-06-26 LAB — HEPARIN LEVEL (UNFRACTIONATED): Heparin Unfractionated: 0.3 IU/mL (ref 0.30–0.70)

## 2021-06-26 MED ORDER — LEVOTHYROXINE SODIUM 25 MCG PO TABS
125.0000 ug | ORAL_TABLET | Freq: Every day | ORAL | Status: DC
  Administered 2021-06-27 – 2021-06-29 (×3): 125 ug via ORAL
  Filled 2021-06-26 (×3): qty 1

## 2021-06-26 MED ORDER — POTASSIUM CHLORIDE CRYS ER 20 MEQ PO TBCR
40.0000 meq | EXTENDED_RELEASE_TABLET | Freq: Once | ORAL | Status: AC
  Administered 2021-06-26: 40 meq via ORAL
  Filled 2021-06-26: qty 2

## 2021-06-26 MED ORDER — TRAZODONE HCL 50 MG PO TABS
50.0000 mg | ORAL_TABLET | Freq: Every evening | ORAL | Status: DC | PRN
  Administered 2021-06-27: 50 mg via ORAL
  Filled 2021-06-26: qty 1

## 2021-06-26 MED ORDER — GABAPENTIN 300 MG PO CAPS
300.0000 mg | ORAL_CAPSULE | Freq: Two times a day (BID) | ORAL | Status: DC
  Administered 2021-06-26 – 2021-06-29 (×7): 300 mg via ORAL
  Filled 2021-06-26 (×7): qty 1

## 2021-06-26 NOTE — Progress Notes (Signed)
ANTICOAGULATION CONSULT NOTE - Follow Up Consult ? ?Pharmacy Consult for heparin ?Indication: chest pain/ACS ? ?Labs: ?Recent Labs  ?  06/24/21 ?1851 06/24/21 ?2111 06/24/21 ?2300 06/25/21 ?0127 06/25/21 ?0755 06/25/21 ?1827 06/26/21 ?0131 06/26/21 ?0132  ?HGB 10.3*  --   --   --   --   --   --  9.4*  ?HCT 33.1*  --   --   --   --   --   --  30.8*  ?PLT 300  --   --   --   --   --   --  273  ?APTT  --   --   --   --   --   --   --  48*  ?HEPARINUNFRC  --   --   --   --  0.12* 0.32 0.30  --   ?CREATININE 3.61*  --   --   --   --   --   --  3.49*  ?TROPONINIHS 29* 311* 1,522* 5,488*  --   --   --   --   ? ? ? ?Estimated Creatinine Clearance: 17.7 mL/min (A) (by C-G formula based on SCr of 3.49 mg/dL (H)). ? ?Infusions:  ? heparin 1,200 Units/hr (06/25/21 1938)  ? ? ?Assessment: ?56 yo female with chest pain started on heparin gtt. Heparin level came back lower range of therapeutic at 0.3 units/ml. ? ?Goal of Therapy:  ?Heparin level 0.3-0.7 units/ml ?Monitor platelets by anticoagulation protocol: Yes ?  ?Plan:  ?Increase heparin drip at 1300 units/hr (13 mL/hr) ?Daily HL and CBC ? ?Lorenso Courier, PharmD ?Clinical Pharmacist ?06/26/2021 7:41 AM ? ? ? ? ? ? ? ?

## 2021-06-26 NOTE — Progress Notes (Signed)
?PROGRESS NOTE ? ? ? ?Monica Carlson  MWN:[id number] DOB: [birth year] DOA: 06/24/2021 ?PCP: [facility]  ? ?  ?Brief Narrative:  ?Monica Carlson is a 56 y.o. female with medical history significant of T2DM, hypertension, hyperlipidemia, chronic systolic/diastolic heart failure, CAD, CKD stage V (baseline creatinine at 3.2-3.6) who presents to the emergency department due to sudden onset of left arm/left upper chest pain which started about 30 minutes prior to arrival to the ED. She was recently hospitalized due to NSTEMI.  She underwent heart cath and was deemed not to be a candidate for CABG or PCI.  Had recommended for medical management at that time.  Now she presents with elevated troponin up to 5488.  Cardiology was consulted, patient started on IV heparin. ? ?New events last 24 hours / Subjective: ?Patient without any new complaints today, denies any chest pain or further arm pain.  No shortness of breath. ? ?Assessment & Plan: ?  ?Principal Problem: ?  NSTEMI (non-ST elevated myocardial infarction) ([place]) ?Active Problems: ?  Acute on chronic combined systolic and diastolic CHF (congestive heart failure) ([place]) ?  Hypothyroidism ?  Uncontrolled type 2 diabetes mellitus with hyperglycemia ([place]) ?  Essential hypertension ?  Benign hypertensive heart and kidney disease and CKD stage V ([place]) ?  Mixed hyperlipidemia ?  GERD (gastroesophageal reflux disease) ? ? ?NSTEMI ?CAD ?-Patient not a candidate for CABG or PCI ?-Recommended for medical management ?-Appreciate cardiology ?-Continue aspirin, Plavix, Lipitor, Zetia, Coreg, Imdur ?-Planning for IV heparin for 48 hours ?-Could consider increasing Coreg dose to 12.5 mg twice daily if blood pressure tolerates ? ?Acute on chronic combined systolic and diastolic heart failure ?-Appreciate cardiology ?-Continue IV Lasix ?-Not a candidate for ACE/ARB/SGLT2 inhibitor due to CKD ?-Could consider adding hydralazine 10 mg 3 times daily ?-Strict I's and  O's, daily weight, fluid restriction diet ? ?Diabetes mellitus ?-Check Ha1c ?-SSI  ? ?CKD stage V ?-Baseline creatinine 3.2-3.6 ?-Remains stable  ? ?Hypothyroidism ?-Synthroid ? ?Hypokalemia ?-Replace, trend ? ? ?DVT prophylaxis: IV heparin  ?SCDs Start: 06/25/21 0606 ? ?Code Status: Full ?Family Communication: None at bedside  ?Disposition Plan:  ?Status is: Inpatient ?Remains inpatient appropriate because: IV lasix, IV heparin  ? ?Consultants:  ?Cardiology  ? ? ? ?Antimicrobials:  ?Anti-infectives (From admission, onward)  ? ? None  ? ?  ? ? ? ?Objective: ?Vitals:  ? 06/25/21 2005 06/26/21 0232 06/26/21 0558 06/26/21 1038  ?BP: 126/69 (!) 109/58 (!) 116/58 120/64  ?Pulse: 71 65 65 67  ?Resp: (!) '21 19 19   '$ ?Temp: 97.9 ?F (36.6 ?C) 98 ?F (36.7 ?C) 98.1 ?F (36.7 ?C)   ?TempSrc: Oral  Oral   ?SpO2: 93% 91% 91%   ?Weight:      ?Height:      ? ? ?Intake/Output Summary (Last 24 hours) at 06/26/2021 1105 ?Last data filed at 06/26/2021 0600 ?Gross per 24 hour  ?Intake 266.86 ml  ?Output --  ?Net 266.86 ml  ? ?Filed Weights  ? 06/25/21 1502  ?Weight: 83.8 kg  ? ? ?Examination:  ?General exam: Appears calm and comfortable  ?Respiratory system: Clear to auscultation. Respiratory effort normal. No respiratory distress. No conversational dyspnea.  ?Cardiovascular system: S1 & S2 heard, RRR. No murmurs. No pedal edema. ?Gastrointestinal system: Abdomen is nondistended, soft and nontender. Normal bowel sounds heard. ?Central nervous system: Alert and oriented. No focal neurological deficits. Speech clear.  ?Extremities: Symmetric in appearance  ?Skin: No rashes, lesions or ulcers on exposed skin  ?  Psychiatry: Judgement and insight appear normal. Mood & affect appropriate.  ? ?Data Reviewed: I have personally reviewed following labs and imaging studies ? ?CBC: ?Recent Labs  ?Lab 06/24/21 ?1851 06/26/21 ?0132  ?WBC 10.0 10.8*  ?HGB 10.3* 9.4*  ?HCT 33.1* 30.8*  ?MCV 99.7 99.4  ?PLT 300 273  ? ?Basic Metabolic Panel: ?Recent Labs  ?Lab  06/24/21 ?1851 06/25/21 ?0127 06/26/21 ?0132  ?NA 136  --  139  ?K 4.3  --  3.4*  ?CL 107  --  109  ?CO2 21*  --  23  ?GLUCOSE 357*  --  103*  ?BUN 37*  --  38*  ?CREATININE 3.61*  --  3.49*  ?CALCIUM 8.6*  --  8.8*  ?MG  --  2.1  --   ?PHOS  --  5.1*  --   ? ?GFR: ?Estimated Creatinine Clearance: 17.7 mL/min (A) (by C-G formula based on SCr of 3.49 mg/dL (H)). ?Liver Function Tests: ?Recent Labs  ?Lab 06/26/21 ?0132  ?AST 16  ?ALT 9  ?ALKPHOS 92  ?BILITOT 0.4  ?PROT 6.6  ?ALBUMIN 3.1*  ? ?No results for input(s): LIPASE, AMYLASE in the last 168 hours. ?No results for input(s): AMMONIA in the last 168 hours. ?Coagulation Profile: ?No results for input(s): INR, PROTIME in the last 168 hours. ?Cardiac Enzymes: ?No results for input(s): CKTOTAL, CKMB, CKMBINDEX, TROPONINI in the last 168 hours. ?BNP (last 3 results) ?No results for input(s): PROBNP in the last 8760 hours. ?HbA1C: ?No results for input(s): HGBA1C in the last 72 hours. ?CBG: ?Recent Labs  ?Lab 06/25/21 ?5456 06/25/21 ?2012 06/25/21 ?2358 06/26/21 ?0429 06/26/21 ?0844  ?GLUCAP 141* 181* 119* 101* 115*  ? ?Lipid Profile: ?Recent Labs  ?  06/26/21 ?0132  ?CHOL 189  ?HDL 27*  ?LDLCALC 120*  ?TRIG 210*  ?CHOLHDL 7.0  ? ?Thyroid Function Tests: ?No results for input(s): TSH, T4TOTAL, FREET4, T3FREE, THYROIDAB in the last 72 hours. ?Anemia Panel: ?No results for input(s): VITAMINB12, FOLATE, FERRITIN, TIBC, IRON, RETICCTPCT in the last 72 hours. ?Sepsis Labs: ?No results for input(s): PROCALCITON, LATICACIDVEN in the last 168 hours. ? ?No results found for this or any previous visit (from the past 240 hour(s)).  ? ? ?Radiology Studies: ?DG Chest 1 View ? ?Result Date: 06/24/2021 ?CLINICAL DATA:  Left arm pain EXAM: CHEST  1 VIEW COMPARISON:  05/02/2021 FINDINGS: Cardiac shadow is mildly enlarged but stable. Mild vascular congestion is seen with mild interstitial edema. No sizable effusion or focal infiltrate is noted. No bony abnormality is seen. IMPRESSION:  Changes of mild CHF Electronically Signed   By: Inez Catalina M.D.   On: 06/24/2021 19:35  ? ?ECHOCARDIOGRAM LIMITED ? ?Result Date: 06/25/2021 ?   ECHOCARDIOGRAM LIMITED REPORT   Patient Name:   FLECIA SHUTTER Date of Exam: 06/25/2021 Medical Rec #:  [id number]    Height:       61.0 in Accession #:    [id number]   Weight:       182.1 lb Date of Birth:  [birth year]   BSA:          1.815 m? Patient Age:    31 years     BP:           116/104 mmHg Patient Gender: F            HR:           73 bpm. Exam Location:  [facility] Procedure: Limited Echo Indications:    CHF  History:  Patient has prior history of Echocardiogram examinations, most                 recent 03/20/2021. CHF, CAD and Previous Myocardial Infarction,                 Stroke; Risk Factors:Hypertension, Diabetes and Dyslipidemia.  Sonographer:    Wenda Low Referring Phys: [id number] OLADAPO ADEFESO IMPRESSIONS  1. Left ventricular ejection fraction, by estimation, is 35 to 40%. The left ventricle has moderately decreased function. There is mild concentric left ventricular hypertrophy. There is akinesis of the left ventricular, apical segment. There is akinesis  of the left ventricular, basal inferior wall. There is akinesis of the left ventricular, apical anterior wall.  2. Left atrial size was moderately dilated.  3. The mitral valve is degenerative.  4. The aortic valve is tricuspid. There is moderate calcification of the aortic valve. Aortic valve sclerosis/calcification is present, without any evidence of aortic stenosis.  5. Tricuspid regurgitation signal is inadequate for assessing PA pressure.  6. Recommend limited study with definity contrast to better elucidate wall motion at the apex FINDINGS  Left Ventricle: Left ventricular ejection fraction, by estimation, is 35 to 40%. The left ventricle has moderately decreased function. There is mild concentric left ventricular hypertrophy. Right Ventricle: Tricuspid regurgitation signal is inadequate for  assessing PA pressure. Left Atrium: Left atrial size was moderately dilated. Mitral Valve: The mitral valve is degenerative in appearance. Mild to moderate mitral annular calcification. Aortic Valve: The a

## 2021-06-27 LAB — BASIC METABOLIC PANEL
Anion gap: 9 (ref 5–15)
BUN: 40 mg/dL — ABNORMAL HIGH (ref 6–20)
CO2: 23 mmol/L (ref 22–32)
Calcium: 8.9 mg/dL (ref 8.9–10.3)
Chloride: 106 mmol/L (ref 98–111)
Creatinine, Ser: 3.74 mg/dL — ABNORMAL HIGH (ref 0.44–1.00)
GFR, Estimated: 14 mL/min — ABNORMAL LOW (ref >60)
Glucose, Bld: 99 mg/dL (ref 70–99)
Potassium: 3.5 mmol/L (ref 3.5–5.1)
Sodium: 138 mmol/L (ref 135–145)

## 2021-06-27 LAB — CBC
HCT: 29.8 % — ABNORMAL LOW (ref 36.0–46.0)
Hemoglobin: 9.3 g/dL — ABNORMAL LOW (ref 12.0–15.0)
MCH: 30.7 pg (ref 26.0–34.0)
MCHC: 31.2 g/dL (ref 30.0–36.0)
MCV: 98.3 fL (ref 80.0–100.0)
Platelets: 287 10*3/uL (ref 150–400)
RBC: 3.03 MIL/uL — ABNORMAL LOW (ref 3.87–5.11)
RDW: 13.9 % (ref 11.5–15.5)
WBC: 11 10*3/uL — ABNORMAL HIGH (ref 4.0–10.5)
nRBC: 0 % (ref 0.0–0.2)

## 2021-06-27 LAB — GLUCOSE, CAPILLARY
Glucose-Capillary: 94 mg/dL (ref 70–99)
Glucose-Capillary: 105 mg/dL — ABNORMAL HIGH (ref 70–99)
Glucose-Capillary: 222 mg/dL — ABNORMAL HIGH (ref 70–99)
Glucose-Capillary: 138 mg/dL — ABNORMAL HIGH (ref 70–99)
Glucose-Capillary: 201 mg/dL — ABNORMAL HIGH (ref 70–99)

## 2021-06-27 LAB — HEMOGLOBIN A1C
Hgb A1c MFr Bld: 7.9 % — ABNORMAL HIGH (ref 4.8–5.6)
Mean Plasma Glucose: 180.03 mg/dL

## 2021-06-27 LAB — HEPARIN LEVEL (UNFRACTIONATED): Heparin Unfractionated: 0.29 IU/mL — ABNORMAL LOW (ref 0.30–0.70)

## 2021-06-27 MED ORDER — HYDROCODONE-ACETAMINOPHEN 5-325 MG PO TABS
1.0000 | ORAL_TABLET | ORAL | Status: DC | PRN
Start: 2021-06-27 — End: 2021-06-29
  Administered 2021-06-27: 1 via ORAL
  Filled 2021-06-27 (×2): qty 1

## 2021-06-27 MED ORDER — HEPARIN SODIUM (PORCINE) 5000 UNIT/ML IJ SOLN
5000.0000 [IU] | Freq: Three times a day (TID) | INTRAMUSCULAR | Status: DC
  Administered 2021-06-27 – 2021-06-29 (×6): 5000 [IU] via SUBCUTANEOUS
  Filled 2021-06-27 (×6): qty 1

## 2021-06-27 MED ORDER — POTASSIUM CHLORIDE CRYS ER 20 MEQ PO TBCR
40.0000 meq | EXTENDED_RELEASE_TABLET | Freq: Once | ORAL | Status: AC
  Administered 2021-06-27: 40 meq via ORAL
  Filled 2021-06-27: qty 2

## 2021-06-27 NOTE — Progress Notes (Signed)
?PROGRESS NOTE ? ? ? ?Monica Carlson  NWG:[id number] DOB: [birth year] DOA: 06/24/2021 ?PCP: [facility]  ? ?  ?Brief Narrative:  ?Monica Carlson is a 56 y.o. female with medical history significant of T2DM, hypertension, hyperlipidemia, chronic systolic/diastolic heart failure, CAD, CKD stage V (baseline creatinine at 3.2-3.6) who presents to the emergency department due to sudden onset of left arm/left upper chest pain which started about 30 minutes prior to arrival to the ED. She was recently hospitalized due to NSTEMI.  She underwent heart cath and was deemed not to be a candidate for CABG or PCI.  Had recommended for medical management at that time.  Now she presents with elevated troponin up to 5488.  Cardiology was consulted, patient started on IV heparin. ? ?New events last 24 hours / Subjective: ?Patient has been ambulating to the bedside commode and back independently.  Has no new complaints, no further chest pain or left arm pain, shortness of breath. ? ?Assessment & Plan: ?  ?Principal Problem: ?  NSTEMI (non-ST elevated myocardial infarction) ([place]) ?Active Problems: ?  Acute on chronic combined systolic and diastolic CHF (congestive heart failure) ([place]) ?  Hypothyroidism ?  Uncontrolled type 2 diabetes mellitus with hyperglycemia ([place]) ?  Essential hypertension ?  Benign hypertensive heart and kidney disease and CKD stage V ([place]) ?  Mixed hyperlipidemia ?  GERD (gastroesophageal reflux disease) ? ? ?NSTEMI ?CAD ?-Patient not a candidate for CABG or PCI ?-Recommended for medical management ?-Appreciate cardiology ?-Continue aspirin, Plavix, Lipitor, Zetia, Coreg, Imdur ?-Completed IV heparin for 48 hours ? ?Acute on chronic combined systolic and diastolic heart failure ?-Appreciate cardiology ?-Continue IV Lasix ?-Not a candidate for ACE/ARB/SGLT2 inhibitor due to CKD ?-Strict I's and O's, daily weight, fluid restriction diet ? ?Diabetes mellitus ?-Ha1c Pending ?-SSI  ? ?CKD stage  V ?-Baseline creatinine 3.2-3.6 ?-Remains stable  ? ?Hypothyroidism ?-Synthroid ? ?Hypokalemia ?-Replace, trend ? ? ?DVT prophylaxis: Subcutaneous heparin ?SCDs Start: 06/25/21 0606 ? ?Code Status: Full ?Family Communication: None at bedside  ?Disposition Plan:  ?Status is: Inpatient ?Remains inpatient appropriate because: IV lasix ? ?Consultants:  ?Cardiology  ? ? ? ?Antimicrobials:  ?Anti-infectives (From admission, onward)  ? ? None  ? ?  ? ? ? ?Objective: ?Vitals:  ? 06/26/21 1956 06/27/21 0537 06/27/21 0538 06/27/21 1002  ?BP: (!) 125/59  104/63 (!) 94/52  ?Pulse:   67 62  ?Resp: '18  18 18  '$ ?Temp: 98.7 ?F (37.1 ?C)  98.5 ?F (36.9 ?C) 98.6 ?F (37 ?C)  ?TempSrc: Oral  Oral Oral  ?SpO2: 95%  92% 96%  ?Weight:  82.6 kg    ?Height:      ? ? ?Intake/Output Summary (Last 24 hours) at 06/27/2021 1046 ?Last data filed at 06/27/2021 0900 ?Gross per 24 hour  ?Intake 696.68 ml  ?Output --  ?Net 696.68 ml  ? ? ?Filed Weights  ? 06/25/21 1502 06/27/21 0537  ?Weight: 83.8 kg 82.6 kg  ? ? ?Examination:  ?General exam: Appears calm and comfortable  ?Respiratory system: Clear to auscultation. Respiratory effort normal. No respiratory distress. No conversational dyspnea.  On room air ?Cardiovascular system: S1 & S2 heard, RRR. No murmurs. No pedal edema. ?Gastrointestinal system: Abdomen is nondistended, soft and nontender. Normal bowel sounds heard. ?Central nervous system: Alert and oriented. No focal neurological deficits. Speech clear.  ?Extremities: Symmetric in appearance  ?Skin: No rashes, lesions or ulcers on exposed skin  ?Psychiatry: Judgement and insight appear normal. Mood & affect appropriate.  ? ?  Data Reviewed: I have personally reviewed following labs and imaging studies ? ?CBC: ?Recent Labs  ?Lab 06/24/21 ?1851 06/26/21 ?0132 06/27/21 ?2706  ?WBC 10.0 10.8* 11.0*  ?HGB 10.3* 9.4* 9.3*  ?HCT 33.1* 30.8* 29.8*  ?MCV 99.7 99.4 98.3  ?PLT 300 273 287  ? ? ?Basic Metabolic Panel: ?Recent Labs  ?Lab 06/24/21 ?1851  06/25/21 ?0127 06/26/21 ?0132 06/27/21 ?2376  ?NA 136  --  139 138  ?K 4.3  --  3.4* 3.5  ?CL 107  --  109 106  ?CO2 21*  --  23 23  ?GLUCOSE 357*  --  103* 99  ?BUN 37*  --  38* 40*  ?CREATININE 3.61*  --  3.49* 3.74*  ?CALCIUM 8.6*  --  8.8* 8.9  ?MG  --  2.1  --   --   ?PHOS  --  5.1*  --   --   ? ? ?GFR: ?Estimated Creatinine Clearance: 16.4 mL/min (A) (by C-G formula based on SCr of 3.74 mg/dL (H)). ?Liver Function Tests: ?Recent Labs  ?Lab 06/26/21 ?0132  ?AST 16  ?ALT 9  ?ALKPHOS 92  ?BILITOT 0.4  ?PROT 6.6  ?ALBUMIN 3.1*  ? ? ?No results for input(s): LIPASE, AMYLASE in the last 168 hours. ?No results for input(s): AMMONIA in the last 168 hours. ?Coagulation Profile: ?No results for input(s): INR, PROTIME in the last 168 hours. ?Cardiac Enzymes: ?No results for input(s): CKTOTAL, CKMB, CKMBINDEX, TROPONINI in the last 168 hours. ?BNP (last 3 results) ?No results for input(s): PROBNP in the last 8760 hours. ?HbA1C: ?No results for input(s): HGBA1C in the last 72 hours. ?CBG: ?Recent Labs  ?Lab 06/26/21 ?1627 06/26/21 ?2015 06/26/21 ?2831 06/27/21 ?0402 06/27/21 ?5176  ?GLUCAP 219* 245* 127* 94 105*  ? ? ?Lipid Profile: ?Recent Labs  ?  06/26/21 ?0132  ?CHOL 189  ?HDL 27*  ?LDLCALC 120*  ?TRIG 210*  ?CHOLHDL 7.0  ? ? ?Thyroid Function Tests: ?No results for input(s): TSH, T4TOTAL, FREET4, T3FREE, THYROIDAB in the last 72 hours. ?Anemia Panel: ?No results for input(s): VITAMINB12, FOLATE, FERRITIN, TIBC, IRON, RETICCTPCT in the last 72 hours. ?Sepsis Labs: ?No results for input(s): PROCALCITON, LATICACIDVEN in the last 168 hours. ? ?No results found for this or any previous visit (from the past 240 hour(s)).  ? ? ?Radiology Studies: ?ECHOCARDIOGRAM LIMITED ? ?Result Date: 06/25/2021 ?   ECHOCARDIOGRAM LIMITED REPORT   Patient Name:   Monica Carlson Date of Exam: 06/25/2021 Medical Rec #:  [id number]    Height:       61.0 in Accession #:    [id number]   Weight:       182.1 lb Date of Birth:  07-Jul-1965   BSA:           1.815 m? Patient Age:    39 years     BP:           116/104 mmHg Patient Gender: F            HR:           73 bpm. Exam Location:  [facility] Procedure: Limited Echo Indications:    CHF  History:        Patient has prior history of Echocardiogram examinations, most                 recent 03/20/2021. CHF, CAD and Previous Myocardial Infarction,                 Stroke; Risk  Factors:Hypertension, Diabetes and Dyslipidemia.  Sonographer:    Wenda Low Referring Phys: [id number] OLADAPO ADEFESO IMPRESSIONS  1. Left ventricular ejection fraction, by estimation, is 35 to 40%. The left ventricle has moderately decreased function. There is mild concentric left ventricular hypertrophy. There is akinesis of the left ventricular, apical segment. There is akinesis  of the left ventricular, basal inferior wall. There is akinesis of the left ventricular, apical anterior wall.  2. Left atrial size was moderately dilated.  3. The mitral valve is degenerative.  4. The aortic valve is tricuspid. There is moderate calcification of the aortic valve. Aortic valve sclerosis/calcification is present, without any evidence of aortic stenosis.  5. Tricuspid regurgitation signal is inadequate for assessing PA pressure.  6. Recommend limited study with definity contrast to better elucidate wall motion at the apex FINDINGS  Left Ventricle: Left ventricular ejection fraction, by estimation, is 35 to 40%. The left ventricle has moderately decreased function. There is mild concentric left ventricular hypertrophy. Right Ventricle: Tricuspid regurgitation signal is inadequate for assessing PA pressure. Left Atrium: Left atrial size was moderately dilated. Mitral Valve: The mitral valve is degenerative in appearance. Mild to moderate mitral annular calcification. Aortic Valve: The aortic valve is tricuspid. There is moderate calcification of the aortic valve. Aortic valve sclerosis/calcification is present, without any evidence of aortic stenosis.  LEFT VENTRICLE PLAX 2D LVIDd:         5.80 cm LVIDs:         4.90 cm LV PW:         1.20 cm LV IVS:        1.40 cm LVOT diam:     2.00 cm LVOT Area:     3.14 cm?  LV Volumes (MOD) LV vol d, MOD A2C: 159.0 ml LV vo

## 2021-06-28 ENCOUNTER — Other Ambulatory Visit: Payer: Self-pay | Admitting: Nurse Practitioner

## 2021-06-28 DIAGNOSIS — N179 Acute kidney failure, unspecified: Secondary | ICD-10-CM

## 2021-06-28 DIAGNOSIS — N189 Chronic kidney disease, unspecified: Secondary | ICD-10-CM

## 2021-06-28 LAB — CBC
HCT: 30.7 % — ABNORMAL LOW (ref 36.0–46.0)
Hemoglobin: 9.2 g/dL — ABNORMAL LOW (ref 12.0–15.0)
MCH: 29.7 pg (ref 26.0–34.0)
MCHC: 30 g/dL (ref 30.0–36.0)
MCV: 99 fL (ref 80.0–100.0)
Platelets: 273 10*3/uL (ref 150–400)
RBC: 3.1 MIL/uL — ABNORMAL LOW (ref 3.87–5.11)
RDW: 13.7 % (ref 11.5–15.5)
WBC: 10.7 10*3/uL — ABNORMAL HIGH (ref 4.0–10.5)
nRBC: 0 % (ref 0.0–0.2)

## 2021-06-28 LAB — GLUCOSE, CAPILLARY
Glucose-Capillary: 112 mg/dL — ABNORMAL HIGH (ref 70–99)
Glucose-Capillary: 125 mg/dL — ABNORMAL HIGH (ref 70–99)
Glucose-Capillary: 137 mg/dL — ABNORMAL HIGH (ref 70–99)
Glucose-Capillary: 166 mg/dL — ABNORMAL HIGH (ref 70–99)
Glucose-Capillary: 177 mg/dL — ABNORMAL HIGH (ref 70–99)
Glucose-Capillary: 181 mg/dL — ABNORMAL HIGH (ref 70–99)
Glucose-Capillary: 187 mg/dL — ABNORMAL HIGH (ref 70–99)
Glucose-Capillary: 203 mg/dL — ABNORMAL HIGH (ref 70–99)

## 2021-06-28 LAB — BASIC METABOLIC PANEL
Anion gap: 9 (ref 5–15)
BUN: 48 mg/dL — ABNORMAL HIGH (ref 6–20)
CO2: 24 mmol/L (ref 22–32)
Calcium: 8.9 mg/dL (ref 8.9–10.3)
Chloride: 106 mmol/L (ref 98–111)
Creatinine, Ser: 4.23 mg/dL — ABNORMAL HIGH (ref 0.44–1.00)
GFR, Estimated: 12 mL/min — ABNORMAL LOW (ref >60)
Glucose, Bld: 108 mg/dL — ABNORMAL HIGH (ref 70–99)
Potassium: 3.7 mmol/L (ref 3.5–5.1)
Sodium: 139 mmol/L (ref 135–145)

## 2021-06-28 NOTE — Progress Notes (Signed)
?PROGRESS NOTE ? ? ? ?Monica Carlson  XKG:[id number] DOB: Mar 28, 1965 DOA: 06/24/2021 ?PCP: [facility]  ? ?  ?Brief Narrative:  ?Monica Carlson is a 56 y.o. female with medical history significant of T2DM, hypertension, hyperlipidemia, chronic systolic/diastolic heart failure, CAD, CKD stage V (baseline creatinine at 3.2-3.6) who presents to the emergency department due to sudden onset of left arm/left upper chest pain which started about 30 minutes prior to arrival to the ED. She was recently hospitalized due to NSTEMI.  She underwent heart cath and was deemed not to be a candidate for CABG or PCI.  Had recommended for medical management at that time.  Now she presents with elevated troponin up to 5488.  Cardiology was consulted, patient started on IV heparin. ? ?New events last 24 hours / Subjective: ?Patient feeling much comfortable; no chest pain, no nausea, no vomiting, no shortness of breath.  Expressed no left arm discomfort and feels ready to go home.  Unfortunately acute kidney injury on chronic renal failure appreciated and holding her for discharge. ? ?Assessment & Plan: ?  ?Principal Problem: ?  NSTEMI (non-ST elevated myocardial infarction) ([place]) ?Active Problems: ?  Acute on chronic combined systolic and diastolic CHF (congestive heart failure) ([place]) ?  Hypothyroidism ?  Uncontrolled type 2 diabetes mellitus with hyperglycemia ([place]) ?  Essential hypertension ?  Benign hypertensive heart and kidney disease and CKD stage V ([place]) ?  Mixed hyperlipidemia ?  GERD (gastroesophageal reflux disease) ? ? ?NSTEMI ?CAD ?-Patient not a candidate for CABG or PCI ?-Recommendations for medical management provided by cardiology service. ?-Continue aspirin, Plavix, Lipitor, Zetia, Coreg, Imdur ?-Completed IV heparin for 48 hours ?-Currently chest pain-free. ? ?Acute on chronic combined systolic and diastolic heart failure ?-Appreciate cardiology assistance and recommendation. ?-Not a candidate for  ACE/ARB/SGLT2 inhibitor due to CKD ?-Continue strict I's and O's, daily weight, fluid restriction diet. ?-Currently euvolemic and demonstrating acute kidney injury on chronic kidney disease; holding diuretics at this point ?-Follow renal function trend in AM. ?-Case discussed with nephrology service who recommended resumption of home dose of Demadex on 06/29/2021 and if lack of improvement in renal function appreciated they will see patient in consultation. ? ?Diabetes mellitus ?-A1c 7.9 ?-Continue SSI and follow CBGs to further adjust hypoglycemic regimen as required. ? ?Acute kidney injury on CKD stage V ?-Baseline creatinine 3.2-3.6 ?-Very likely in the setting of diuresis. ?-Creatinine 4.23 today ?-Patient euvolemic ?-Holding further diuretics ?-Follow renal function trend. ?-Advised to maintain adequate hydration ?-Minimize any other nephrotoxic agents and avoid hypotension. ? ?Hypothyroidism ?-Continue current dose of Synthroid ? ?Hypokalemia ?-Continue to follow electrolytes trend and further replete as needed. ? ? ?DVT prophylaxis: Subcutaneous heparin ?heparin injection 5,000 Units Start: 06/27/21 1400 ?SCDs Start: 06/25/21 0606 ? ?Code Status: Full ?Family Communication: None at bedside  ?Disposition Plan:  ?Status is: Inpatient ?Remains inpatient appropriate because: Currently euvolemic; with acute kidney injury superimposed on chronic kidney disease.  Following renal function and stability prior to discharge. ? ?Consultants:  ?Cardiology  ?Nephrology service curbside. ? ? ? ?Antimicrobials:  ?Anti-infectives (From admission, onward)  ? ? None  ? ?  ? ? ? ?Objective: ?Vitals:  ? 06/27/21 2100 06/28/21 0500 06/28/21 0545 06/28/21 1436  ?BP: 108/64 (!) 99/49  (!) 109/58  ?Pulse: 60 66  (!) 57  ?Resp: 20 20    ?Temp: 97.7 ?F (36.5 ?C) 98.2 ?F (36.8 ?C)  98.4 ?F (36.9 ?C)  ?TempSrc: Oral Oral  Oral  ?SpO2: 97% 92%  95%  ?Weight:   82.9 kg   ?Height:      ? ? ?Intake/Output Summary (Last 24 hours) at 06/28/2021  1813 ?Last data filed at 06/28/2021 7494 ?Gross per 24 hour  ?Intake 240 ml  ?Output --  ?Net 240 ml  ? ?Filed Weights  ? 06/25/21 1502 06/27/21 0537 06/28/21 0545  ?Weight: 83.8 kg 82.6 kg 82.9 kg  ? ? ?Examination:  ?General exam: Alert, awake, oriented x 3; no chest pain or left upper arm discomfort today.  Good oxygen saturation on room air and expressed no shortness of breath at rest. ?Respiratory system: Clear to auscultation. Respiratory effort normal.  No using accessory muscles. ?Cardiovascular system:RRR.  No rubs, no gallops, no JVD on exam. ?Gastrointestinal system: Abdomen is nondistended, soft and nontender. No organomegaly or masses felt. Normal bowel sounds heard. ?Central nervous system: Alert and oriented. No focal neurological deficits. ?Extremities: No cyanosis, clubbing or edema. ?Skin: No petechiae. ?Psychiatry: Judgement and insight appear normal. Mood & affect appropriate.  ? ?Data Reviewed: I have personally reviewed following labs and imaging studies ? ?CBC: ?Recent Labs  ?Lab 06/24/21 ?1851 06/26/21 ?0132 06/27/21 ?4967 06/28/21 ?0527  ?WBC 10.0 10.8* 11.0* 10.7*  ?HGB 10.3* 9.4* 9.3* 9.2*  ?HCT 33.1* 30.8* 29.8* 30.7*  ?MCV 99.7 99.4 98.3 99.0  ?PLT 300 273 287 273  ? ?Basic Metabolic Panel: ?Recent Labs  ?Lab 06/24/21 ?1851 06/25/21 ?0127 06/26/21 ?0132 06/27/21 ?5916 06/28/21 ?0527  ?NA 136  --  139 138 139  ?K 4.3  --  3.4* 3.5 3.7  ?CL 107  --  109 106 106  ?CO2 21*  --  '23 23 24  '$ ?GLUCOSE 357*  --  103* 99 108*  ?BUN 37*  --  38* 40* 48*  ?CREATININE 3.61*  --  3.49* 3.74* 4.23*  ?CALCIUM 8.6*  --  8.8* 8.9 8.9  ?MG  --  2.1  --   --   --   ?PHOS  --  5.1*  --   --   --   ? ?GFR: ?Estimated Creatinine Clearance: 14.5 mL/min (A) (by C-G formula based on SCr of 4.23 mg/dL (H)). ? ?Liver Function Tests: ?Recent Labs  ?Lab 06/26/21 ?0132  ?AST 16  ?ALT 9  ?ALKPHOS 92  ?BILITOT 0.4  ?PROT 6.6  ?ALBUMIN 3.1*  ? ?HbA1C: ?Recent Labs  ?  06/27/21 ?0452  ?HGBA1C 7.9*  ? ?CBG: ?Recent Labs  ?Lab  06/28/21 ?0008 06/28/21 ?0359 06/28/21 ?3846 06/28/21 ?1122 06/28/21 ?6599  ?GLUCAP 137* 112* 125* 166* 177*  ? ?Lipid Profile: ?Recent Labs  ?  06/26/21 ?0132  ?CHOL 189  ?HDL 27*  ?LDLCALC 120*  ?TRIG 210*  ?CHOLHDL 7.0  ? ?Radiology Studies: ?No results found. ? ? ?Scheduled Meds: ? aspirin EC  81 mg Oral Daily  ? atorvastatin  80 mg Oral QHS  ? carvedilol  6.25 mg Oral BID  ? clopidogrel  75 mg Oral Q breakfast  ? ezetimibe  10 mg Oral Daily  ? famotidine  20 mg Oral Daily  ? gabapentin  300 mg Oral BID  ? heparin  5,000 Units Subcutaneous Q8H  ? insulin aspart  0-9 Units Subcutaneous Q4H  ? isosorbide mononitrate  30 mg Oral Daily  ? levothyroxine  125 mcg Oral QAC breakfast  ? ? ? ? LOS: 3 days  ? ? ? ?Barton Dubois, MD ?[facility] ?06/28/2021, 6:13 PM  ? ?Available via Epic secure chat 7am-7pm ?After these hours, please refer to coverage  provider listed on [email or web address] ? ?

## 2021-06-28 NOTE — Progress Notes (Signed)
?Cardiology Progress Note  ?Patient ID: Monica Carlson ?MRN: [id number] ?DOB: Dec 16, 1965 ?Date of Encounter: 06/28/2021 ? ?Primary Cardiologist: None ? ?Subjective  ? ?Chief Complaint: none.  ? ?HPI: Denies chest pain or trouble breathing.  Seems to be doing well. ? ?ROS:  ?All other ROS reviewed and negative. Pertinent positives noted in the HPI.    ? ?Inpatient Medications  ?Scheduled Meds: ? aspirin EC  81 mg Oral Daily  ? atorvastatin  80 mg Oral QHS  ? carvedilol  6.25 mg Oral BID  ? clopidogrel  75 mg Oral Q breakfast  ? ezetimibe  10 mg Oral Daily  ? famotidine  20 mg Oral Daily  ? gabapentin  300 mg Oral BID  ? heparin  5,000 Units Subcutaneous Q8H  ? insulin aspart  0-9 Units Subcutaneous Q4H  ? isosorbide mononitrate  30 mg Oral Daily  ? levothyroxine  125 mcg Oral QAC breakfast  ? ?Continuous Infusions: ? ?PRN Meds: ?HYDROcodone-acetaminophen, traZODone  ? ?Vital Signs  ? ?Vitals:  ? 06/27/21 1320 06/27/21 2100 06/28/21 0500 06/28/21 0545  ?BP: (!) 99/54 108/64 (!) 99/49   ?Pulse: 62 60 66   ?Resp: '18 20 20   '$ ?Temp: 98.3 ?F (36.8 ?C) 97.7 ?F (36.5 ?C) 98.2 ?F (36.8 ?C)   ?TempSrc: Oral Oral Oral   ?SpO2: 94% 97% 92%   ?Weight:    82.9 kg  ?Height:      ? ?No intake or output data in the 24 hours ending 06/28/21 0909 ? ?  06/28/2021  ?  5:45 AM 06/27/2021  ?  5:37 AM 06/25/2021  ?  3:02 PM  ?Last 3 Weights  ?Weight (lbs) 182 lb 12.2 oz 182 lb 1.6 oz 184 lb 11.9 oz  ?Weight (kg) 82.9 kg 82.6 kg 83.8 kg  ?   ? ?Telemetry  ?Overnight telemetry shows sinus bradycardia heart rate 50-60 bpm, which I personally reviewed.  ? ?ECG  ?The most recent ECG shows sinus rhythm heart rate 84, inferolateral T wave inversions, which I personally reviewed.  ? ?Physical Exam  ? ?Vitals:  ? 06/27/21 1320 06/27/21 2100 06/28/21 0500 06/28/21 0545  ?BP: (!) 99/54 108/64 (!) 99/49   ?Pulse: 62 60 66   ?Resp: '18 20 20   '$ ?Temp: 98.3 ?F (36.8 ?C) 97.7 ?F (36.5 ?C) 98.2 ?F (36.8 ?C)   ?TempSrc: Oral Oral Oral   ?SpO2: 94% 97% 92%   ?Weight:     82.9 kg  ?Height:      ? No intake or output data in the 24 hours ending 06/28/21 0909  ? ?  06/28/2021  ?  5:45 AM 06/27/2021  ?  5:37 AM 06/25/2021  ?  3:02 PM  ?Last 3 Weights  ?Weight (lbs) 182 lb 12.2 oz 182 lb 1.6 oz 184 lb 11.9 oz  ?Weight (kg) 82.9 kg 82.6 kg 83.8 kg  ?  Body mass index is 34.53 kg/m?.  ?General: Well nourished, well developed, in no acute distress ?Head: Atraumatic, normal size  ?Eyes: PEERLA, EOMI  ?Neck: Supple, no JVD ?Endocrine: No thryomegaly ?Cardiac: Normal S1, S2; RRR; no murmurs, rubs, or gallops ?Lungs: Clear to auscultation bilaterally, no wheezing, rhonchi or rales  ?Abd: Soft, nontender, no hepatomegaly  ?Ext: No edema, pulses 2+ ?Musculoskeletal: No deformities, BUE and BLE strength normal and equal ?Skin: Warm and dry, no rashes   ?Neuro: Alert and oriented to person, place, time, and situation, CNII-XII grossly intact, no focal deficits  ?Psych: Normal mood and affect  ? ?  Labs  ?High Sensitivity Troponin:   ?Recent Labs  ?Lab 06/24/21 ?1851 06/24/21 ?2111 06/24/21 ?2300 06/25/21 ?0127  ?TROPONINIHS 29* 311* 1,522* 5,488*  ?   ?Cardiac EnzymesNo results for input(s): TROPONINI in the last 168 hours. No results for input(s): TROPIPOC in the last 168 hours.  ?Chemistry ?Recent Labs  ?Lab 06/26/21 ?0132 06/27/21 ?0452 06/28/21 ?0527  ?NA 139 138 139  ?K 3.4* 3.5 3.7  ?CL 109 106 106  ?CO2 '23 23 24  '$ ?GLUCOSE 103* 99 108*  ?BUN 38* 40* 48*  ?CREATININE 3.49* 3.74* 4.23*  ?CALCIUM 8.8* 8.9 8.9  ?PROT 6.6  --   --   ?ALBUMIN 3.1*  --   --   ?AST 16  --   --   ?ALT 9  --   --   ?ALKPHOS 92  --   --   ?BILITOT 0.4  --   --   ?GFRNONAA 15* 14* 12*  ?ANIONGAP '7 9 9  '$ ?  ?Hematology ?Recent Labs  ?Lab 06/26/21 ?0132 06/27/21 ?0452 06/28/21 ?0527  ?WBC 10.8* 11.0* 10.7*  ?RBC 3.10* 3.03* 3.10*  ?HGB 9.4* 9.3* 9.2*  ?HCT 30.8* 29.8* 30.7*  ?MCV 99.4 98.3 99.0  ?MCH 30.3 30.7 29.7  ?MCHC 30.5 31.2 30.0  ?RDW 14.0 13.9 13.7  ?PLT 273 287 273  ? ?BNPNo results for input(s): BNP, PROBNP in the last  168 hours.  ?DDimer No results for input(s): DDIMER in the last 168 hours.  ? ?Radiology  ?No results found. ? ?Cardiac Studies  ?TTE 06/25/2021 ? 1. Left ventricular ejection fraction, by estimation, is 35 to 40%. The  ?left ventricle has moderately decreased function. There is mild concentric  ?left ventricular hypertrophy. There is akinesis of the left ventricular,  ?apical segment. There is akinesis  ? of the left ventricular, basal inferior wall. There is akinesis of the  ?left ventricular, apical anterior wall.  ? 2. Left atrial size was moderately dilated.  ? 3. The mitral valve is degenerative.  ? 4. The aortic valve is tricuspid. There is moderate calcification of the  ?aortic valve. Aortic valve sclerosis/calcification is present, without any  ?evidence of aortic stenosis.  ? 5. Tricuspid regurgitation signal is inadequate for assessing PA  ?pressure.  ? 6. Recommend limited study with definity contrast to better elucidate  ?wall motion at the apex  ? ?LHC 03/23/2021 ?  Mid RCA lesion is 100% stenosed.  Left to right collaterals. ?  2nd Mrg lesion is 90% stenosed. ?  Ost LAD to Mid LAD lesion is 75% stenosed. ?  1st Diag lesion is 75% stenosed. ?  LV end diastolic pressure is mildly elevated. ?  There is no aortic valve stenosis. ? ?Patient Profile  ?Monica Carlson is a 56 y.o. female with CAD status post PCI (multivessel CAD on left heart cath in January 2023, not amendable to PCI, not a CABG candidate), heart failure with reduced ejection fraction, hypertension, diabetes, CKD stage IV-V who was admitted on 06/25/2021 with chest pain symptoms and non-STEMI. ? ?Assessment & Plan  ? ?#Non-STEMI ?#Multivessel CAD ?#No options for revascularization ?-Left heart cath from January 2023 was reviewed.  She has an occluded RCA with left to right collaterals, 90% OM 2, 75% ostial LAD, 75% first diagonal branch. ?-Deemed not a candidate for PCI or CABG. ?-She has CKD stage IV.  Clearly not a great candidate for aggressive  or invasive cardiovascular care. ?-She has completed 40 hours of heparin.  We will continue with medical  management. ?-She is on aspirin and Plavix.  She is on Coreg 6.25 mg twice daily.  She is on high intensity statin. ?-Ejection fraction not far off from baseline. ?-From a standpoint since we are offering no other invasive options would recommend discharge home per hospital medicine. ? ?#Ischemic cardiomyopathy, EF 35 to 40% ?-Medical management is the best we can do.  Not a candidate for ACE/ARB/ARNI/MRA. ?-On Coreg 6.25 mg twice daily, Imdur 30 mg daily.  Consider addition of hydralazine as an outpatient.  Blood pressure is quite soft here. ?-Euvolemic on exam.  ?-diuretics per nephrology given advanced CKD ? ?#AKI ?-Creatinine continues to climb. ?-No urine output recorded.  She does have several urine episodes reported but I's and O's were not recorded.  She does not need IV diuresis. ?-May need nephrology evaluation.  We will continue to hold diuretic.  She is euvolemic on exam.   ? ?[facility] will sign off.   ?Medication Recommendations: As above ?Other recommendations (labs, testing, etc): None. ?Follow up as an outpatient: We will arrange hospital follow-up in 2 to 3 weeks ? ?For questions or updates, please contact [place] ?Please consult [email or web address] for contact info under  ? ?Signed, ?[place] T. Audie Box, MD, [facility] ?[place]  ?06/28/2021 9:09 AM  ? ?

## 2021-06-29 DIAGNOSIS — E1121 Type 2 diabetes mellitus with diabetic nephropathy: Secondary | ICD-10-CM

## 2021-06-29 LAB — BASIC METABOLIC PANEL
Anion gap: 10 (ref 5–15)
BUN: 55 mg/dL — ABNORMAL HIGH (ref 6–20)
CO2: 24 mmol/L (ref 22–32)
Calcium: 9 mg/dL (ref 8.9–10.3)
Chloride: 105 mmol/L (ref 98–111)
Creatinine, Ser: 4.46 mg/dL — ABNORMAL HIGH (ref 0.44–1.00)
GFR, Estimated: 11 mL/min — ABNORMAL LOW (ref >60)
Glucose, Bld: 104 mg/dL — ABNORMAL HIGH (ref 70–99)
Potassium: 3.6 mmol/L (ref 3.5–5.1)
Sodium: 139 mmol/L (ref 135–145)

## 2021-06-29 LAB — GLUCOSE, CAPILLARY
Glucose-Capillary: 100 mg/dL — ABNORMAL HIGH (ref 70–99)
Glucose-Capillary: 103 mg/dL — ABNORMAL HIGH (ref 70–99)
Glucose-Capillary: 255 mg/dL — ABNORMAL HIGH (ref 70–99)

## 2021-06-29 MED ORDER — ISOSORBIDE MONONITRATE ER 30 MG PO TB24: 30.0000 mg | ORAL_TABLET | Freq: Every day | ORAL | 2 refills | Status: DC

## 2021-06-29 MED ORDER — TORSEMIDE 20 MG PO TABS: 40.0000 mg | ORAL_TABLET | Freq: Every day | ORAL | Status: AC

## 2021-06-29 NOTE — Care Management Important Message (Signed)
Important Message ? ?Patient Details  ?Name: Monica Carlson ?MRN: [id number] ?Date of Birth: 1965-08-04 ? ? ?Medicare Important Message Given:  Yes ? ? ? ? ?Tommy Medal ?06/29/2021, 12:00 PM ?

## 2021-06-29 NOTE — TOC Transition Note (Signed)
Transition of Care (TOC) - CM/SW Discharge Note ? ? ?Patient Details  ?Name: Monica Carlson ?MRN: [id number] ?Date of Birth: 1966-01-13 ? ?Transition of Care (TOC) CM/SW Contact:  ?Shade Flood, LCSW ?Phone Number: ?06/29/2021, 9:48 AM ? ? ?Clinical Narrative:    ? ?Pt stable for dc today per MD. There are no TOC needs identified for dc. ? ?Final next level of care: Home/Self Care ?Barriers to Discharge: Barriers Resolved ? ? ?Patient Goals and CMS Choice ?Patient states their goals for this hospitalization and ongoing recovery are:: to return home ?  ?  ? ?Discharge Placement ?  ?           ?  ?  ?  ?  ? ?Discharge Plan and Services ?  ?Discharge Planning Services: CM Consult ?           ?DME Arranged: N/A ?DME Agency: NA ?  ?  ?  ?HH Arranged: NA ?  ?  ?  ?  ? ?Social Determinants of Health (SDOH) Interventions ?  ? ? ?Readmission Risk Interventions ? ?  06/25/2021  ?  2:27 PM 05/04/2021  ?  2:36 PM  ?Readmission Risk Prevention Plan  ?Transportation Screening Complete Complete  ?PCP or Specialist Appt within 3-5 Days  Complete  ?[place] or Home Care Consult  Complete  ?Social Work Consult for [place] Planning/Counseling  Complete  ?Palliative Care Screening  Not Applicable  ?Medication Review Press photographer) Complete Complete  ?[place] or Home Care Consult Complete   ?SW Recovery Care/Counseling Consult Complete   ?Palliative Care Screening Not Applicable   ?[place] Not Applicable   ? ? ? ? ? ?

## 2021-06-29 NOTE — Plan of Care (Signed)

## 2021-06-29 NOTE — Progress Notes (Signed)
Ng Discharge Note ? ?Admit Date:  06/24/2021 ?Discharge date: 06/29/2021 ?  ?Monica Carlson to be D/C'd Home per MD order.  AVS completed. ?Patient/caregiver able to verbalize understanding. ? ?Discharge Medication: ?Allergies as of 06/29/2021   ? ?   Reactions  ? Cedar Swelling  ?   ? Other Swelling  ? Teriyaki  ? ?  ? ?  ?Medication List  ?  ? ?STOP taking these medications   ? ?glipiZIDE 10 MG tablet ?Commonly known as: GLUCOTROL ?  ? ?  ? ?TAKE these medications   ? ?Accu-Chek Guide Me w/Device Kit ?1 Piece by Does not apply route as directed. ?  ?acetaminophen 500 MG tablet ?Commonly known as: TYLENOL ?Take 1,000-1,500 mg by mouth every 6 (six) hours as needed (migraine). ?  ?albuterol 108 (90 Base) MCG/ACT inhaler ?Commonly known as: VENTOLIN HFA ?Inhale 2 puffs into the lungs every 4 (four) hours as needed for shortness of breath. ?  ?aspirin EC 81 MG tablet ?Take 81 mg by mouth daily. Swallow whole. ?  ?atorvastatin 80 MG tablet ?Commonly known as: LIPITOR ?Take 1 tablet (80 mg total) by mouth at bedtime. ?  ?carvedilol 6.25 MG tablet ?Commonly known as: COREG ?Take 6.25 mg by mouth 2 (two) times daily. ?  ?clopidogrel 75 MG tablet ?Commonly known as: PLAVIX ?TAKE ONE TABLET BY MOUTH EVERY DAY WITH BREAKFAST ?What changed: See the new instructions. ?  ?ezetimibe 10 MG tablet ?Commonly known as: ZETIA ?Take 1 tablet (10 mg total) by mouth daily. ?  ?famotidine 20 MG tablet ?Commonly known as: PEPCID ?Take by mouth. ?  ?gabapentin 300 MG capsule ?Commonly known as: NEURONTIN ?Take 300 mg by mouth 2 (two) times daily. ?  ?glucose blood test strip ?Commonly known as: Accu-Chek Guide ?Use as instructed 4 x daily. e11.65 ?  ?glucose blood test strip ?1 each by Other route 2 (two) times daily. Use as instructed bid. E11.65 ?  ?icosapent Ethyl 1 g capsule ?Commonly known as: Vascepa ?Take 2 capsules (2 g total) by mouth 2 (two) times daily. With food ?  ?insulin detemir 100 UNIT/ML FlexPen ?Commonly known as:  LEVEMIR ?Inject 15 Units into the skin at bedtime. ?  ?isosorbide mononitrate 30 MG 24 hr tablet ?Commonly known as: IMDUR ?Take 1 tablet (30 mg total) by mouth daily. ?What changed: how much to take ?  ?Januvia 50 MG tablet ?Generic drug: sitaGLIPtin ?TAKE 1 TABLET BY MOUTH EVERY DAY ?What changed:  ?how much to take ?when to take this ?  ?Lancets Thin Misc ?1 each by Does not apply route 4 (four) times daily. E11.65 Pt has Prodigy meter ?  ?levothyroxine 125 MCG tablet ?Commonly known as: SYNTHROID ?TAKE 1 TABLET BY MOUTH DAILY BEFORE BREAKFAST ?  ?nitroGLYCERIN 0.4 MG SL tablet ?Commonly known as: NITROSTAT ?Place 1 tablet (0.4 mg total) under the tongue every 5 (five) minutes as needed for chest pain. ?  ?Pen Needles 32G X 5 MM Misc ?Use 1 needle daily to inject insulin as instructed. ?  ?potassium chloride SA 20 MEQ tablet ?Commonly known as: KLOR-CON M ?Take 20 mEq by mouth 2 (two) times daily. ?  ?torsemide 20 MG tablet ?Commonly known as: DEMADEX ?Take 2 tablets (40 mg total) by mouth daily. ?Start taking on: Jul 01, 2021 ?What changed: These instructions start on Jul 01, 2021. If you are unsure what to do until then, ask your doctor or other care provider. ?  ?traZODone 50 MG tablet ?Commonly known as: DESYREL ?  Take 50 mg by mouth at bedtime as needed. ?  ? ?  ? ? ?Discharge Assessment: ?Vitals:  ? 06/28/21 2009 06/29/21 0528  ?BP: 118/66 (!) 108/59  ?Pulse: (!) 58 60  ?Resp: 19 20  ?Temp: 98.1 ?F (36.7 ?C) 98 ?F (36.7 ?C)  ?SpO2: 94% 95%  ? Skin clean, dry and intact without evidence of skin break down, no evidence of skin tears noted. ?IV catheter discontinued intact. Site without signs and symptoms of complications - no redness or edema noted at insertion site, patient denies c/o pain - only slight tenderness at site.  Dressing with slight pressure applied. ? ?D/c Instructions-Education: ?Discharge instructions given to patient/family with verbalized understanding. ?D/c education completed with  patient/family including follow up instructions, medication list, d/c activities limitations if indicated, with other d/c instructions as indicated by MD - patient able to verbalize understanding, all questions fully answered. ?Patient instructed to return to ED, call 911, or call MD for any changes in condition.  ?Patient escorted via [place], and D/C home via private auto. ? ?Tsosie Billing, LPN ?10/25/9686 6:48 AM   ?

## 2021-06-29 NOTE — Discharge Summary (Signed)
?Physician Discharge Summary ?  ?Patient: Monica Carlson MRN: [id number] DOB: 1966/01/31  ?Admit date:     06/24/2021  ?Discharge date: 06/29/21  ?Discharge Physician: Barton Dubois  ? ?PCP: [facility]  ? ?Recommendations at discharge:  ?Repeat basic metabolic panel to follow electrolytes and renal function ?Reassess blood pressure and adjust antihypertensive treatment as needed ?Continue close monitoring of patient's CBGs with further adjustment to hypoglycemic regimen as required ?Make sure patient has follow-up with nephrology service and cardiology service as instructed. ? ?Discharge Diagnoses: ?Principal Problem: ?  NSTEMI (non-ST elevated myocardial infarction) ([place]) ?Active Problems: ?  Acute on chronic combined systolic and diastolic CHF (congestive heart failure) ([place]) ?  Hypothyroidism ?  Uncontrolled type 2 diabetes mellitus with hyperglycemia ([place]) ?  Type 2 diabetes with nephropathy ([place]) ?  Essential hypertension ?  Benign hypertensive heart and kidney disease and CKD stage V ([place]) ?  Mixed hyperlipidemia ?  GERD (gastroesophageal reflux disease) ? ?Brief admission narrative: ?Monica Carlson is a 56 y.o. female with medical history significant of T2DM, hypertension, hyperlipidemia, chronic systolic/diastolic heart failure, CAD, CKD stage V (baseline creatinine at 3.2-3.6) who presents to the emergency department due to sudden onset of left arm/left upper chest pain which started about 30 minutes prior to arrival to the ED. She was recently hospitalized due to NSTEMI.  She underwent heart cath and was deemed not to be a candidate for CABG or PCI.  Had recommended for medical management at that time.  Now she presents with elevated troponin up to 5488.  Cardiology was consulted, patient started on IV heparin. ?  ? ?Assessment and Plan: ?NSTEMI ?CAD ?-Patient not a candidate for CABG or PCI ?-Recommendations for medical management provided by cardiology service. ?-Continue aspirin,  Plavix, Lipitor, Zetia, Coreg, Imdur ?-Completed IV heparin for 48 hours ?-Currently chest pain-free and feeling ready to go home.. ?  ?Acute on chronic combined systolic and diastolic heart failure ?-Appreciate cardiology assistance and recommendation. ?-Not a candidate for ACE/ARB/SGLT2 inhibitor due to CKD ?-Continue strict I's and O's, daily weight, fluid restriction diet. ?-Currently euvolemic and demonstrating acute kidney injury on chronic kidney disease; holding diuretics at this point ?-Follow renal function trend in AM. ?-Case discussed with nephrology service who recommended resumption of home dose of Demadex on 07/01/2021 and close follow-up with her primary nephrologist. ? ?Diabetes mellitus ?-A1c 7.9 ?-Resume home hypoglycemic regimen. ? ?Acute kidney injury on CKD stage V ?-Baseline creatinine 3.2-3.6 ?-Very likely in the setting of diuresis. ?-Creatinine 4.46 at time of discharge; GFR has remained stable.  ?-Patient euvolemic ?-Holding torsemide for 24 hours prior to resume as an outpatient and close follow-up with nephrology service. ?-Case discussed with Dr. Joaquim Lai for who was in agreement with recommendations.  Patient reports good urine output and her electrolytes/BUN have remained stable. ?-Advised to maintain adequate hydration ?-Minimize any other nephrotoxic agents and avoid hypotension. ?  ?Hypothyroidism ?-Continue current dose of Synthroid ?  ?Hypokalemia ?-Continue to follow electrolytes trend and further replete as needed. ?  ?Gastroesophageal reflux disease ?-Continue PPI. ? ?Class II obesity ?-Body mass index is 34.53 kg/m?. ?-Low calorie diet, portion control and increase physical activity discussed with patient. ? ?Consultants: Cardiology service; nephrology curbside. ?Procedures performed: See below for x-ray reports. ?Disposition: Home ?Diet recommendation: Cardiac and Carb modified diet ? ?DISCHARGE MEDICATION: ?Allergies as of 06/29/2021   ? ?   Reactions  ? Cedar Swelling  ?   ?  Other Swelling  ? Teriyaki  ? ?  ? ?  ?  Medication List  ?  ? ?STOP taking these medications   ? ?glipiZIDE 10 MG tablet ?Commonly known as: GLUCOTROL ?  ? ?  ? ?TAKE these medications   ? ?Accu-Chek Guide Me w/Device Kit ?1 Piece by Does not apply route as directed. ?  ?acetaminophen 500 MG tablet ?Commonly known as: TYLENOL ?Take 1,000-1,500 mg by mouth every 6 (six) hours as needed (migraine). ?  ?albuterol 108 (90 Base) MCG/ACT inhaler ?Commonly known as: VENTOLIN HFA ?Inhale 2 puffs into the lungs every 4 (four) hours as needed for shortness of breath. ?  ?aspirin EC 81 MG tablet ?Take 81 mg by mouth daily. Swallow whole. ?  ?atorvastatin 80 MG tablet ?Commonly known as: LIPITOR ?Take 1 tablet (80 mg total) by mouth at bedtime. ?  ?carvedilol 6.25 MG tablet ?Commonly known as: COREG ?Take 6.25 mg by mouth 2 (two) times daily. ?  ?clopidogrel 75 MG tablet ?Commonly known as: PLAVIX ?TAKE ONE TABLET BY MOUTH EVERY DAY WITH BREAKFAST ?What changed: See the new instructions. ?  ?ezetimibe 10 MG tablet ?Commonly known as: ZETIA ?Take 1 tablet (10 mg total) by mouth daily. ?  ?famotidine 20 MG tablet ?Commonly known as: PEPCID ?Take by mouth. ?  ?gabapentin 300 MG capsule ?Commonly known as: NEURONTIN ?Take 300 mg by mouth 2 (two) times daily. ?  ?glucose blood test strip ?Commonly known as: Accu-Chek Guide ?Use as instructed 4 x daily. e11.65 ?  ?glucose blood test strip ?1 each by Other route 2 (two) times daily. Use as instructed bid. E11.65 ?  ?icosapent Ethyl 1 g capsule ?Commonly known as: Vascepa ?Take 2 capsules (2 g total) by mouth 2 (two) times daily. With food ?  ?insulin detemir 100 UNIT/ML FlexPen ?Commonly known as: LEVEMIR ?Inject 15 Units into the skin at bedtime. ?  ?isosorbide mononitrate 30 MG 24 hr tablet ?Commonly known as: IMDUR ?Take 1 tablet (30 mg total) by mouth daily. ?What changed: how much to take ?  ?Januvia 50 MG tablet ?Generic drug: sitaGLIPtin ?TAKE 1 TABLET BY MOUTH EVERY DAY ?What  changed:  ?how much to take ?when to take this ?  ?Lancets Thin Misc ?1 each by Does not apply route 4 (four) times daily. E11.65 Pt has Prodigy meter ?  ?levothyroxine 125 MCG tablet ?Commonly known as: SYNTHROID ?TAKE 1 TABLET BY MOUTH DAILY BEFORE BREAKFAST ?  ?nitroGLYCERIN 0.4 MG SL tablet ?Commonly known as: NITROSTAT ?Place 1 tablet (0.4 mg total) under the tongue every 5 (five) minutes as needed for chest pain. ?  ?Pen Needles 32G X 5 MM Misc ?Use 1 needle daily to inject insulin as instructed. ?  ?potassium chloride SA 20 MEQ tablet ?Commonly known as: KLOR-CON M ?Take 20 mEq by mouth 2 (two) times daily. ?  ?torsemide 20 MG tablet ?Commonly known as: DEMADEX ?Take 2 tablets (40 mg total) by mouth daily. ?Start taking on: Jul 01, 2021 ?What changed: These instructions start on Jul 01, 2021. If you are unsure what to do until then, ask your doctor or other care provider. ?  ?traZODone 50 MG tablet ?Commonly known as: DESYREL ?Take 50 mg by mouth at bedtime as needed. ?  ? ?  ? ? Follow-up Information   ? ? [facility]. Schedule an appointment as soon as possible for a visit in 2 week(s).   ?Contact information: ?[place] [address] ?[phone] ? ? ?  ?  ? ? Liana Gerold, MD. Schedule an appointment  as soon as possible for a visit in 1 week(s).   ?Specialty: Nephrology ?Contact information: ?[address] ?[place] [address] ?[phone] ? ? ?  ?  ? ?  ?  ? ?  ? ?Discharge Exam: ?Filed Weights  ? 06/25/21 1502 06/27/21 0537 06/28/21 0545  ?Weight: 83.8 kg 82.6 kg 82.9 kg  ? ?General exam: Alert, awake, oriented x 3; no complaining of chest pain, shortness of breath, nausea, vomiting or any other complaints. ?Respiratory system: Clear to auscultation. Respiratory effort normal.  Good saturation on room air. ?Cardiovascular system:RRR. No rubs, gallops or JVD appreciated on exam. ?Gastrointestinal system: Abdomen is obese, nondistended, soft and  nontender. No organomegaly or masses felt. Normal bowel sounds heard. ?Central nervous system: Alert and oriented. No focal neurological deficits. ?Extremities: No cyanosis or clubbing ?Skin: No petechiae. ?[place]yc

## 2021-07-06 ENCOUNTER — Other Ambulatory Visit: Payer: Self-pay | Admitting: Nurse Practitioner

## 2021-07-27 ENCOUNTER — Ambulatory Visit (INDEPENDENT_AMBULATORY_CARE_PROVIDER_SITE_OTHER): Payer: Medicare Other | Admitting: Physician Assistant

## 2021-07-27 ENCOUNTER — Encounter: Payer: Self-pay | Admitting: Physician Assistant

## 2021-07-27 VITALS — BP 132/80 | HR 62 | Ht 61.0 in | Wt 188.0 lb

## 2021-07-27 DIAGNOSIS — N184 Chronic kidney disease, stage 4 (severe): Secondary | ICD-10-CM | POA: Diagnosis not present

## 2021-07-27 DIAGNOSIS — I5042 Chronic combined systolic (congestive) and diastolic (congestive) heart failure: Secondary | ICD-10-CM

## 2021-07-27 DIAGNOSIS — E7800 Pure hypercholesterolemia, unspecified: Secondary | ICD-10-CM | POA: Diagnosis not present

## 2021-07-27 DIAGNOSIS — I2510 Atherosclerotic heart disease of native coronary artery without angina pectoris: Secondary | ICD-10-CM | POA: Diagnosis not present

## 2021-07-27 MED ORDER — ISOSORBIDE MONONITRATE ER 60 MG PO TB24: 60.0000 mg | ORAL_TABLET | Freq: Every day | ORAL | 3 refills | Status: DC

## 2021-07-27 NOTE — Progress Notes (Signed)
Cardiology Office Note   Date:  07/27/2021   ID:  Monica Carlson, DOB 02-21-1966, MRN [id number]  PCP:  Monica Douglas, MD Cardiologist:  Carlyle Dolly, MD 04/23/2021 in-hospital Electrphysiologist: None Monica Ferries, PA-C    History of Present Illness: Monica Carlson is a 56 y.o. female with a history of  CAD status post PCI (multivessel CAD on left heart cath in January 2023, not amendable to PCI, not a CABG candidate), heart failure with reduced ejection fraction, hypertension, diabetes, CKD stage IV-V.  She was admitted on 5/5/-06/29/2021 with chest pain symptoms and non-STEMI, med rx recommended for known occluded RCA and other dz. EF a little lower than previous.   Monica Carlson presents for cardiology followup  She has been doing well for the last month, since d/c.  A day recently, she had chest pain, just after getting out of bed.  She took ASA 81 mg x 4 and 1 nitro when the ASA did not work. The pain resolved after the nitro. She is encouraged to take the nitro first.  She has not had any other chest pain.  She has an aide daily, they provide transportation and helps around the house. She has a boyfriend that comes over and gives her the insulin. He cooks on the weekend.   No lower extremity edema, no orthopnea or PND.  No new dyspnea on exertion.  No palpitations, no presyncope or syncope.  Her activity level is not very high, but this is related to her vision and other noncardiac issues.   Past Medical History:  Diagnosis Date   Cervical cancer ([place])    cryotherapy and colposcopy/[place]; 1991   CHF (congestive heart failure) ([place]) 07-2014   Diabetes mellitus without complication ([place]) 3810   High cholesterol    Hypertension    MI (myocardial infarction) ([place])    2004   Stroke ([place]) 09/2014, 2013   Vaginal Pap smear, abnormal     Past Surgical History:  Procedure Laterality Date   CHOLECYSTECTOMY  1992   CORONARY STENT PLACEMENT  2004    LEFT HEART CATH AND CORONARY ANGIOGRAPHY N/A 03/23/2021   Procedure: LEFT HEART CATH AND CORONARY ANGIOGRAPHY;  Surgeon: Jettie Booze, MD;  Location: [facility];  Service: Cardiovascular;  Laterality: N/A;   TUBAL LIGATION      Current Outpatient Medications  Medication Sig Dispense Refill   acetaminophen (TYLENOL) 500 MG tablet Take 1,000-1,500 mg by mouth every 6 (six) hours as needed (migraine).     albuterol (VENTOLIN HFA) 108 (90 Base) MCG/ACT inhaler Inhale 2 puffs into the lungs every 4 (four) hours as needed for shortness of breath.     aspirin EC 81 MG tablet Take 81 mg by mouth daily. Swallow whole.     Blood Glucose Monitoring Suppl (ACCU-CHEK GUIDE ME) w/Device KIT 1 Piece by Does not apply route as directed. 1 kit 0   carvedilol (COREG) 6.25 MG tablet Take 6.25 mg by mouth 2 (two) times daily.     ezetimibe (ZETIA) 10 MG tablet Take 1 tablet (10 mg total) by mouth daily. 30 tablet 11   gabapentin (NEURONTIN) 300 MG capsule Take 300 mg by mouth 2 (two) times daily.     glipiZIDE (GLUCOTROL) 10 MG tablet Take 10 mg by mouth daily before breakfast.     glucose blood (ACCU-CHEK GUIDE) test strip Use as instructed 4 x daily. e11.65 150 each 2   glucose blood test strip 1 each  by Other route 2 (two) times daily. Use as instructed bid. E11.65 200 each 2   icosapent Ethyl (VASCEPA) 1 g capsule Take 2 capsules (2 g total) by mouth 2 (two) times daily. With food 120 capsule 11   insulin detemir (LEVEMIR) 100 UNIT/ML FlexPen Inject 15 Units into the skin at bedtime. 15 mL 11   Insulin Pen Needle (PEN NEEDLES) 32G X 5 MM MISC Use 1 needle daily to inject insulin as instructed. 100 each 2   isosorbide mononitrate (IMDUR) 30 MG 24 hr tablet Take 1 tablet (30 mg total) by mouth daily. 30 tablet 2   Lancets Thin MISC 1 each by Does not apply route 4 (four) times daily. E11.65 Pt has Prodigy meter 150 each 2   levothyroxine (SYNTHROID) 112 MCG tablet Take 112 mcg by mouth every  morning.     nitroGLYCERIN (NITROSTAT) 0.4 MG SL tablet Place 1 tablet (0.4 mg total) under the tongue every 5 (five) minutes as needed for chest pain. 30 tablet 2   potassium chloride SA (KLOR-CON M) 20 MEQ tablet Take 20 mEq by mouth 2 (two) times daily.     sodium bicarbonate 650 MG tablet Take 650 mg by mouth 2 (two) times daily.     torsemide (DEMADEX) 20 MG tablet Take 2 tablets (40 mg total) by mouth daily.     traZODone (DESYREL) 50 MG tablet Take 50 mg by mouth at bedtime as needed.     atorvastatin (LIPITOR) 80 MG tablet Take 1 tablet (80 mg total) by mouth at bedtime. (Patient not taking: Reported on 07/27/2021) 30 tablet 2   clopidogrel (PLAVIX) 75 MG tablet TAKE ONE TABLET BY MOUTH EVERY DAY WITH BREAKFAST (Patient not taking: Reported on 07/27/2021) 90 tablet 1   famotidine (PEPCID) 20 MG tablet Take by mouth. (Patient not taking: Reported on 07/27/2021)     JANUVIA 50 MG tablet TAKE 1 TABLET BY MOUTH EVERY DAY (Patient not taking: Reported on 07/27/2021) 90 tablet 0   Current Facility-Administered Medications  Medication Dose Route Frequency Provider Last Rate Last Admin   glucose blood test strip STRP 1 each  1 each Other BID Dorris Fetch Marella Chimes, MD        Allergies:   Cedar and Other    Social History:  The patient  reports that she has never smoked. She has never used smokeless tobacco. She reports that she does not drink alcohol and does not use drugs.   Family History:  The patient's family history includes Diabetes in her father; Heart attack in her maternal grandfather; Heart disease in her father; Hypertension in her father; Lung cancer in her paternal grandmother; Migraines in her maternal grandfather and mother.  She indicated that her mother is alive. She indicated that her father is alive. She indicated that her sister is alive. She indicated that her brother is alive. She indicated that her maternal grandfather is alive. She indicated that her paternal grandmother is  deceased. She indicated that all of her three daughters are alive. She indicated that the status of her neg hx is unknown.    ROS:  Please see the history of present illness. All other systems are reviewed and negative.    PHYSICAL EXAM: VS:  BP 132/80   Pulse 62   Ht '5\' 1"'  (1.549 m)   Wt 188 lb (85.3 kg)   SpO2 95%   BMI 35.52 kg/m  , BMI Body mass index is 35.52 kg/m. GEN: Well nourished, well developed, female  in no acute distress HEENT: normal for age  Neck: no JVD, no carotid bruit, no masses Cardiac: RRR; no murmur, no rubs, or gallops Respiratory:  clear to auscultation bilaterally, normal work of breathing GI: soft, nontender, nondistended, + BS MS: no deformity or atrophy; no edema; distal pulses are 2+ in all 4 extremities  Skin: warm and dry, no rash Neuro:  Strength and sensation are intact Psych: euthymic mood, full affect   EKG:  EKG is not ordered today.  ECHO: 06/25/2021  1. Left ventricular ejection fraction, by estimation, is 35 to 40%. The  left ventricle has moderately decreased function. There is mild concentric  left ventricular hypertrophy. There is akinesis of the left ventricular,  apical segment. There is akinesis  of the left ventricular, basal inferior wall.  There is akinesis of the left ventricular, apical anterior wall.   2. Left atrial size was moderately dilated.   3. The mitral valve is degenerative.   4. The aortic valve is tricuspid. There is moderate calcification of the  aortic valve. Aortic valve sclerosis/calcification is present, without any  evidence of aortic stenosis.   5. Tricuspid regurgitation signal is inadequate for assessing PA  pressure.   6. Recommend limited study with definity contrast to better elucidate  wall motion at the apex   CATH: 03/23/2021   Mid RCA lesion is 100% stenosed.  Left to right collaterals.   2nd Mrg lesion is 90% stenosed.   Ost LAD to Mid LAD lesion is 75% stenosed.   1st Diag lesion is 75%  stenosed.   LV end diastolic pressure is mildly elevated.   There is no aortic valve stenosis. Diagnostic Dominance: Right    Recent Labs: 12/02/2020: TSH 16.100 05/02/2021: B Natriuretic Peptide 1,132.0 06/25/2021: Magnesium 2.1 06/26/2021: ALT 9 06/28/2021: Hemoglobin 9.2; Platelets 273 06/29/2021: BUN 55; Creatinine, Ser 4.46; Potassium 3.6; Sodium 139  CBC    Component Value Date/Time   WBC 10.7 (H) 06/28/2021 0527   RBC 3.10 (L) 06/28/2021 0527   HGB 9.2 (L) 06/28/2021 0527   HCT 30.7 (L) 06/28/2021 0527   PLT 273 06/28/2021 0527   MCV 99.0 06/28/2021 0527   MCH 29.7 06/28/2021 0527   MCHC 30.0 06/28/2021 0527   RDW 13.7 06/28/2021 0527   LYMPHSABS 1.4 04/21/2021 0410   MONOABS 0.4 04/21/2021 0410   EOSABS 0.0 04/21/2021 0410   BASOSABS 0.0 04/21/2021 0410      Latest Ref Rng & Units 06/29/2021    5:30 AM 06/28/2021    5:27 AM 06/27/2021    4:52 AM  CMP  Glucose 70 - 99 mg/dL 104   108   99    BUN 6 - 20 mg/dL 55   48   40    Creatinine 0.44 - 1.00 mg/dL 4.46   4.23   3.74    Sodium 135 - 145 mmol/L 139   139   138    Potassium 3.5 - 5.1 mmol/L 3.6   3.7   3.5    Chloride 98 - 111 mmol/L 105   106   106    CO2 22 - 32 mmol/L '24   24   23    ' Calcium 8.9 - 10.3 mg/dL 9.0   8.9   8.9       Lipid Panel Lab Results  Component Value Date   CHOL 189 06/26/2021   HDL 27 (L) 06/26/2021   LDLCALC 120 (H) 06/26/2021   TRIG 210 (H) 06/26/2021  CHOLHDL 7.0 06/26/2021      Wt Readings from Last 3 Encounters:  07/27/21 188 lb (85.3 kg)  06/28/21 182 lb 12.2 oz (82.9 kg)  05/05/21 182 lb 1.6 oz (82.6 kg)     Other studies Reviewed: Additional studies/ records that were reviewed today include: Office notes, hospital records and testing.  ASSESSMENT AND PLAN:  1.  CAD: - At her recent admission, her Imdur was increased from 15 up to 30 mg daily. -She has only had 1 episode of chest pain since then, but is concerned because she has been hospitalized 4 times in the last 6  months for chest pain/non-STEMI - We will increase the Imdur to 60 mg daily - Continue other medications as prescribed  2.  Diabetes, CKD 4-5 -She says that someone came to her house and was talking about getting her a dietitian or nutritionist -I think this is an excellent idea, the challenges of eating a low-cholesterol diabetic diet with renal restrictions, even without factoring in her poor vision, are immense. -Agree with nutritionist/dietitian home visits, to be done through her PCP  3.  Chronic systolic CHF - No volume overload by exam with a weight of 188 pounds - Is 8 pounds over previous weight, but that was a first thing in the morning weight at the hospital. -Continue Demadex 40 mg daily and K-Dur 40 mEq daily  4.  Hyperlipidemia: - She says she is compliant with her atorvastatin 80 mg daily and Zetia 10 mg daily -However, during her recent hospitalization, her LDL was 120, with an HDL of 27. -With her LDL that far above goal, and the patient being compliant with her medications, she may need additional therapy such as Vascepa or Nexlitol-discuss with MD -She does not like shots, so would prefer oral therapy   Current medicines are reviewed at length with the patient today.  The patient does not have concerns regarding medicines.  The following changes have been made: Increase Imdur  Labs/ tests ordered today include:  No orders of the defined types were placed in this encounter.    Disposition:   FU with Carlyle Dolly, MD keep scheduled appointment  Signed, Monica Ferries, PA-C  07/27/2021 1:28 PM    [facility] Phone: [phone]; Fax: [phone]

## 2021-07-27 NOTE — Patient Instructions (Signed)
Medication Instructions:  INCREASE Imdur to 60 mg daily  Labwork: None today  Testing/Procedures: None today  Follow-Up: 3 months    Please follow up with your primary care doctor. Set up an appointment with a nutritionist.   Any Other Special Instructions Will Be Listed Below (If Applicable).  If you need a refill on your cardiac medications before your next appointment, please call your pharmacy.

## 2021-08-03 ENCOUNTER — Other Ambulatory Visit: Payer: Self-pay | Admitting: Nurse Practitioner

## 2021-08-11 ENCOUNTER — Other Ambulatory Visit: Payer: Self-pay | Admitting: Nurse Practitioner

## 2021-08-11 ENCOUNTER — Telehealth: Payer: Self-pay | Admitting: Physician Assistant

## 2021-08-11 NOTE — Telephone Encounter (Signed)
*  STAT* If patient is at the pharmacy, call can be transferred to refill team.   1. Which medications need to be refilled? (please list name of each medication and dose if known) levothyroxine (SYNTHROID) 112 MCG tablet  2. Which pharmacy/location (including street and city if local pharmacy) is medication to be sent to? [place], [place] - [place]  3. Do they need a 30 day or 90 day supply? 90 day

## 2021-08-11 NOTE — Telephone Encounter (Signed)
Pt notified to call PCP for refill of Synthroid. Patient voiced understanding.

## 2021-09-10 ENCOUNTER — Emergency Department (HOSPITAL_COMMUNITY): Payer: Medicare Other

## 2021-09-10 ENCOUNTER — Inpatient Hospital Stay (HOSPITAL_COMMUNITY)
Admission: EM | Admit: 2021-09-10 | Discharge: 2021-09-14 | DRG: 281 | Disposition: A | Discharge status: Home / Self Care | Payer: Medicare Other | Attending: Internal Medicine | Admitting: Internal Medicine

## 2021-09-10 DIAGNOSIS — I119 Hypertensive heart disease without heart failure: Secondary | ICD-10-CM | POA: Diagnosis present

## 2021-09-10 DIAGNOSIS — I5042 Chronic combined systolic (congestive) and diastolic (congestive) heart failure: Secondary | ICD-10-CM | POA: Diagnosis present

## 2021-09-10 DIAGNOSIS — Z833 Family history of diabetes mellitus: Secondary | ICD-10-CM

## 2021-09-10 DIAGNOSIS — Z7902 Long term (current) use of antithrombotics/antiplatelets: Secondary | ICD-10-CM | POA: Diagnosis not present

## 2021-09-10 DIAGNOSIS — N184 Chronic kidney disease, stage 4 (severe): Secondary | ICD-10-CM | POA: Diagnosis not present

## 2021-09-10 DIAGNOSIS — Z91048 Other nonmedicinal substance allergy status: Secondary | ICD-10-CM

## 2021-09-10 DIAGNOSIS — I080 Rheumatic disorders of both mitral and aortic valves: Secondary | ICD-10-CM | POA: Diagnosis present

## 2021-09-10 DIAGNOSIS — Z955 Presence of coronary angioplasty implant and graft: Secondary | ICD-10-CM | POA: Diagnosis not present

## 2021-09-10 DIAGNOSIS — Z8541 Personal history of malignant neoplasm of cervix uteri: Secondary | ICD-10-CM

## 2021-09-10 DIAGNOSIS — Z79899 Other long term (current) drug therapy: Secondary | ICD-10-CM | POA: Diagnosis not present

## 2021-09-10 DIAGNOSIS — Z794 Long term (current) use of insulin: Secondary | ICD-10-CM

## 2021-09-10 DIAGNOSIS — E039 Hypothyroidism, unspecified: Secondary | ICD-10-CM | POA: Diagnosis present

## 2021-09-10 DIAGNOSIS — Z91018 Allergy to other foods: Secondary | ICD-10-CM

## 2021-09-10 DIAGNOSIS — I252 Old myocardial infarction: Secondary | ICD-10-CM | POA: Diagnosis not present

## 2021-09-10 DIAGNOSIS — K219 Gastro-esophageal reflux disease without esophagitis: Secondary | ICD-10-CM | POA: Diagnosis present

## 2021-09-10 DIAGNOSIS — I132 Hypertensive heart and chronic kidney disease with heart failure and with stage 5 chronic kidney disease, or end stage renal disease: Secondary | ICD-10-CM | POA: Diagnosis present

## 2021-09-10 DIAGNOSIS — I10 Essential (primary) hypertension: Secondary | ICD-10-CM | POA: Diagnosis not present

## 2021-09-10 DIAGNOSIS — Z8673 Personal history of transient ischemic attack (TIA), and cerebral infarction without residual deficits: Secondary | ICD-10-CM

## 2021-09-10 DIAGNOSIS — I2510 Atherosclerotic heart disease of native coronary artery without angina pectoris: Secondary | ICD-10-CM | POA: Diagnosis not present

## 2021-09-10 DIAGNOSIS — N185 Chronic kidney disease, stage 5: Secondary | ICD-10-CM | POA: Diagnosis present

## 2021-09-10 DIAGNOSIS — E669 Obesity, unspecified: Secondary | ICD-10-CM | POA: Diagnosis present

## 2021-09-10 DIAGNOSIS — Z79890 Hormone replacement therapy (postmenopausal): Secondary | ICD-10-CM

## 2021-09-10 DIAGNOSIS — I43 Cardiomyopathy in diseases classified elsewhere: Secondary | ICD-10-CM | POA: Diagnosis present

## 2021-09-10 DIAGNOSIS — E782 Mixed hyperlipidemia: Secondary | ICD-10-CM | POA: Diagnosis present

## 2021-09-10 DIAGNOSIS — Z8249 Family history of ischemic heart disease and other diseases of the circulatory system: Secondary | ICD-10-CM

## 2021-09-10 DIAGNOSIS — Z7982 Long term (current) use of aspirin: Secondary | ICD-10-CM

## 2021-09-10 DIAGNOSIS — Z6835 Body mass index (BMI) 35.0-35.9, adult: Secondary | ICD-10-CM

## 2021-09-10 DIAGNOSIS — E1121 Type 2 diabetes mellitus with diabetic nephropathy: Secondary | ICD-10-CM | POA: Diagnosis present

## 2021-09-10 DIAGNOSIS — E1165 Type 2 diabetes mellitus with hyperglycemia: Secondary | ICD-10-CM | POA: Diagnosis present

## 2021-09-10 DIAGNOSIS — D509 Iron deficiency anemia, unspecified: Secondary | ICD-10-CM | POA: Diagnosis not present

## 2021-09-10 DIAGNOSIS — D631 Anemia in chronic kidney disease: Secondary | ICD-10-CM | POA: Diagnosis present

## 2021-09-10 DIAGNOSIS — Z20822 Contact with and (suspected) exposure to covid-19: Secondary | ICD-10-CM | POA: Diagnosis present

## 2021-09-10 DIAGNOSIS — R54 Age-related physical debility: Secondary | ICD-10-CM | POA: Diagnosis present

## 2021-09-10 DIAGNOSIS — I248 Other forms of acute ischemic heart disease: Secondary | ICD-10-CM | POA: Diagnosis not present

## 2021-09-10 DIAGNOSIS — E1122 Type 2 diabetes mellitus with diabetic chronic kidney disease: Secondary | ICD-10-CM | POA: Diagnosis present

## 2021-09-10 DIAGNOSIS — I214 Non-ST elevation (NSTEMI) myocardial infarction: Secondary | ICD-10-CM | POA: Diagnosis present

## 2021-09-10 DIAGNOSIS — Z7984 Long term (current) use of oral hypoglycemic drugs: Secondary | ICD-10-CM

## 2021-09-10 LAB — CBC WITH DIFFERENTIAL/PLATELET
Abs Immature Granulocytes: 0.05 10*3/uL (ref 0.00–0.07)
Basophils Absolute: 0 10*3/uL (ref 0.0–0.1)
Basophils Relative: 0 %
Eosinophils Absolute: 0.1 10*3/uL (ref 0.0–0.5)
Eosinophils Relative: 1 %
HCT: 32 % — ABNORMAL LOW (ref 36.0–46.0)
Hemoglobin: 9.6 g/dL — ABNORMAL LOW (ref 12.0–15.0)
Immature Granulocytes: 1 %
Lymphocytes Relative: 12 %
Lymphs Abs: 1.3 10*3/uL (ref 0.7–4.0)
MCH: 30.8 pg (ref 26.0–34.0)
MCHC: 30 g/dL (ref 30.0–36.0)
MCV: 102.6 fL — ABNORMAL HIGH (ref 80.0–100.0)
Monocytes Absolute: 0.4 10*3/uL (ref 0.1–1.0)
Monocytes Relative: 4 %
Neutro Abs: 9 10*3/uL — ABNORMAL HIGH (ref 1.7–7.7)
Neutrophils Relative %: 82 %
Platelets: 257 10*3/uL (ref 150–400)
RBC: 3.12 MIL/uL — ABNORMAL LOW (ref 3.87–5.11)
RDW: 15.9 % — ABNORMAL HIGH (ref 11.5–15.5)
WBC: 10.8 10*3/uL — ABNORMAL HIGH (ref 4.0–10.5)
nRBC: 0 % (ref 0.0–0.2)

## 2021-09-10 LAB — COMPREHENSIVE METABOLIC PANEL
ALT: 17 U/L (ref 0–44)
AST: 16 U/L (ref 15–41)
Albumin: 3.4 g/dL — ABNORMAL LOW (ref 3.5–5.0)
Alkaline Phosphatase: 75 U/L (ref 38–126)
Anion gap: 10 (ref 5–15)
BUN: 33 mg/dL — ABNORMAL HIGH (ref 6–20)
CO2: 13 mmol/L — ABNORMAL LOW (ref 22–32)
Calcium: 8.1 mg/dL — ABNORMAL LOW (ref 8.9–10.3)
Chloride: 114 mmol/L — ABNORMAL HIGH (ref 98–111)
Creatinine, Ser: 3.61 mg/dL — ABNORMAL HIGH (ref 0.44–1.00)
GFR, Estimated: 14 mL/min — ABNORMAL LOW (ref >60)
Glucose, Bld: 256 mg/dL — ABNORMAL HIGH (ref 70–99)
Potassium: 4 mmol/L (ref 3.5–5.1)
Sodium: 137 mmol/L (ref 135–145)
Total Bilirubin: 0.5 mg/dL (ref 0.3–1.2)
Total Protein: 6.2 g/dL — ABNORMAL LOW (ref 6.5–8.1)

## 2021-09-10 LAB — PROTIME-INR
INR: 1.3 — ABNORMAL HIGH (ref 0.8–1.2)
Prothrombin Time: 16.3 seconds — ABNORMAL HIGH (ref 11.4–15.2)

## 2021-09-10 LAB — RESP PANEL BY RT-PCR (FLU A&B, COVID) ARPGX2
Influenza A by PCR: NEGATIVE
Influenza B by PCR: NEGATIVE
SARS Coronavirus 2 by RT PCR: NEGATIVE

## 2021-09-10 LAB — TROPONIN I (HIGH SENSITIVITY)
Troponin I (High Sensitivity): 260 ng/L (ref <18)
Troponin I (High Sensitivity): 369 ng/L (ref <18)

## 2021-09-10 LAB — I-STAT BETA HCG BLOOD, ED (MC, WL, AP ONLY): I-stat hCG, quantitative: 12.4 m[IU]/mL — ABNORMAL HIGH (ref <5)

## 2021-09-10 LAB — HEMOGLOBIN A1C
Hgb A1c MFr Bld: 6.7 % — ABNORMAL HIGH (ref 4.8–5.6)
Mean Plasma Glucose: 145.59 mg/dL

## 2021-09-10 LAB — LIPID PANEL
Cholesterol: 158 mg/dL (ref 0–200)
HDL: 29 mg/dL — ABNORMAL LOW (ref >40)
LDL Cholesterol: 114 mg/dL — ABNORMAL HIGH (ref 0–99)
Total CHOL/HDL Ratio: 5.4 RATIO
Triglycerides: 77 mg/dL (ref <150)
VLDL: 15 mg/dL (ref 0–40)

## 2021-09-10 LAB — APTT: aPTT: >200 seconds (ref 24–36)

## 2021-09-10 MED ORDER — ASPIRIN 81 MG PO TBEC
81.0000 mg | DELAYED_RELEASE_TABLET | Freq: Every day | ORAL | Status: DC
Start: 2021-09-11 — End: 2021-09-14
  Administered 2021-09-11 – 2021-09-14 (×4): 81 mg via ORAL
  Filled 2021-09-10 (×4): qty 1

## 2021-09-10 MED ORDER — LORAZEPAM 2 MG/ML IJ SOLN
1.0000 mg | Freq: Once | INTRAMUSCULAR | Status: AC
Start: 2021-09-10 — End: 2021-09-10
  Administered 2021-09-10: 1 mg via INTRAVENOUS
  Filled 2021-09-10: qty 1

## 2021-09-10 MED ORDER — INSULIN ASPART 100 UNIT/ML IJ SOLN
0.0000 [IU] | Freq: Three times a day (TID) | INTRAMUSCULAR | Status: DC
  Administered 2021-09-11: 2 [IU] via SUBCUTANEOUS
  Administered 2021-09-11: 1 [IU] via SUBCUTANEOUS
  Administered 2021-09-12: 2 [IU] via SUBCUTANEOUS
  Administered 2021-09-12: 1 [IU] via SUBCUTANEOUS
  Administered 2021-09-13: 2 [IU] via SUBCUTANEOUS
  Administered 2021-09-13 – 2021-09-14 (×2): 1 [IU] via SUBCUTANEOUS

## 2021-09-10 MED ORDER — ACETAMINOPHEN 325 MG PO TABS: 650.0000 mg | ORAL_TABLET | ORAL | Status: DC | PRN

## 2021-09-10 MED ORDER — NITROGLYCERIN 0.4 MG SL SUBL: 0.4000 mg | SUBLINGUAL_TABLET | SUBLINGUAL | Status: DC | PRN

## 2021-09-10 MED ORDER — HEPARIN SODIUM (PORCINE) 5000 UNIT/ML IJ SOLN
4000.0000 [IU] | Freq: Once | INTRAMUSCULAR | Status: AC
  Administered 2021-09-10: 4000 [IU] via INTRAVENOUS

## 2021-09-10 MED ORDER — METOPROLOL SUCCINATE ER 25 MG PO TB24: 12.5000 mg | ORAL_TABLET | Freq: Every day | ORAL | Status: DC

## 2021-09-10 MED ORDER — HEPARIN (PORCINE) 25000 UT/250ML-% IV SOLN
1450.0000 [IU]/h | INTRAVENOUS | Status: DC
  Administered 2021-09-10 – 2021-09-12 (×2): 900 [IU]/h via INTRAVENOUS
  Administered 2021-09-13: 1200 [IU]/h via INTRAVENOUS
  Filled 2021-09-10 (×3): qty 250

## 2021-09-10 MED ORDER — ATORVASTATIN CALCIUM 80 MG PO TABS
80.0000 mg | ORAL_TABLET | Freq: Every day | ORAL | Status: DC
Start: 2021-09-10 — End: 2021-09-14
  Administered 2021-09-11 – 2021-09-14 (×5): 80 mg via ORAL
  Filled 2021-09-10 (×3): qty 1
  Filled 2021-09-10 (×2): qty 2

## 2021-09-10 MED ORDER — SODIUM CHLORIDE 0.9 % IV SOLN
INTRAVENOUS | Status: DC
Start: 2021-09-10 — End: 2021-09-14

## 2021-09-10 NOTE — H&P (Signed)
Cardiology Admission History and Physical:   Patient ID: Monica Carlson MRN: [id number]; DOB: [birth year]   Admission date: 09/10/2021  PCP:  Monica Douglas, MD   [facility] Cardiologist:  Monica Dolly, MD        Chief Complaint:  Chest pain  Patient Profile:   Monica Carlson is a 56 y.o. female with pmh sx for T2DM, hypertension, hyperlipidemia, chronic systolic/diastolic heart failure, CAD, CKD stage V (baseline creatinine at 3.2-3.6)  who is being seen 09/10/2021 for the evaluation of NSTEMI.  History of Present Illness:   Monica Carlson is a 56 y.o. female with pmh sx for T2DM, hypertension, hyperlipidemia, chronic systolic/diastolic heart failure, CAD, CKD stage V (baseline creatinine at 3.2-3.6)  who is being seen 09/10/2021 for the evaluation of NSTEMI. She has had multiple admissions for NSTEMI in the past 3-4 months. She underwent heart cath and was deemed not to be a candidate for CABG or PCI.  Had recommended for medical management at that time in May 2023. Now today, she started again having chest pain suddenly. states she was watching tv and started having mid sternal chest pain 10/10. Took 324 mg ASA and 1 SL nitro. EMS gave 6m zofran, 1 SL nitro. Initially Code STEMI was activated however was cancelled by Dr KClaiborne Billingsas no definitive STEMI and CP had improved. Her first troponin was 269 in the ED. CP is almost gone. Cr is in 3s. Cardiology was called for admission. Heparin was started.    Past Medical History:  Diagnosis Date   [place]    [place]; [place]   [place] ([place]estive heart [place]) ([place] 07-2014   Diabetes mellitus [place] complication ([place] [address]  High cholesterol    Hypertension    MI ([place]ocardial infarction) ([place]    2004   Stroke ([place] 09/2014, 2013   Vaginal Pap smear, abnormal     Past Surgical History:  Procedure Laterality Date   CHOLECYSTECTOMY  1992   CORONARY STENT PLACEMENT  2004   LEFT  HEART CATH AND CORONARY ANGIOGRAPHY N/A 03/23/2021   Procedure: LEFT HEART CATH AND CORONARY ANGIOGRAPHY;  Surgeon: VJettie Booze MD;  [facility];  Service: Cardiovascular;  Laterality: N/A;   TUBAL LIGATION       Medications Prior to Admission: Prior to Admission medications   Medication Sig Start Date End Date Taking? Authorizing Provider  acetaminophen (TYLENOL) 500 MG tablet Take 1,000-1,500 mg by mouth every 6 (six) hours as needed (migraine).    [provider]  albuterol (VENTOLIN HFA) 108 (90 Base) MCG/ACT inhaler Inhale 2 puffs into the lungs every 4 (four) hours as needed for shortness of breath. 02/27/21   [provider]  aspirin EC 81 MG tablet Take 81 mg by mouth daily. Swallow whole.    [provider]  atorvastatin (LIPITOR) 80 MG tablet Take 1 tablet (80 mg total) by mouth at bedtime. Patient not taking: Reported on 07/27/2021 03/24/21 06/24/21  [place] EDonnamarie Poag DO  Blood Glucose Monitoring Suppl (ACCU-CHEK GUIDE ME) w/Device KIT 1 Piece by Does not apply route as directed. 07/09/18   NCassandria Anger MD  carvedilol (COREG) 6.25 MG tablet Take 6.25 mg by mouth 2 (two) times daily. 02/09/21   [provider]  clopidogrel (PLAVIX) 75 MG tablet TAKE ONE TABLET BY MOUTH EVERY DAY WITH BREAKFAST Patient not taking: Reported on 07/27/2021 07/10/17   HCaren Macadam MD  ezetimibe (ZETIA) 10 MG tablet Take  1 tablet (10 mg total) by mouth daily. 04/15/21   Sueanne Margarita, MD  famotidine (PEPCID) 20 MG tablet Take by mouth. Patient not taking: Reported on 07/27/2021 06/16/21 06/16/22  [provider]  gabapentin (NEURONTIN) 300 MG capsule Take 300 mg by mouth 2 (two) times daily. 10/11/17   [provider]  glipiZIDE (GLUCOTROL) 10 MG tablet Take 10 mg by mouth daily before breakfast.    [provider]  glucose blood (ACCU-CHEK GUIDE) test strip Use as instructed 4 x daily. e11.65 07/10/18   Cassandria Anger, MD  glucose blood test strip 1 each by Other route 2 (two) times daily. Use as instructed bid. E11.65 12/18/18   Cassandria Anger, MD  icosapent Ethyl (VASCEPA) 1 g capsule Take 2 capsules (2 g total) by mouth 2 (two) times daily. With food 04/15/21   Sueanne Margarita, MD  insulin detemir (LEVEMIR) 100 UNIT/ML FlexPen Inject 15 Units into the skin at bedtime. 04/23/21   Barton Dubois, MD  Insulin Pen Needle (PEN NEEDLES) 32G X 5 MM MISC Use 1 needle daily to inject insulin as instructed. 04/23/21   Barton Dubois, MD  isosorbide mononitrate (IMDUR) 60 MG 24 hr tablet Take 1 tablet (60 mg total) by mouth daily. 07/27/21 07/22/22  Barrett, Evelene Croon, PA-C  JANUVIA 50 MG tablet TAKE 1 TABLET BY MOUTH EVERY DAY Patient not taking: Reported on 07/27/2021 02/23/21   Brita Romp, NP  Lancets Thin MISC 1 each by Does not apply route 4 (four) times daily. E11.65 Pt has Prodigy meter 10/02/18   Cassandria Anger, MD  levothyroxine (SYNTHROID) 112 MCG tablet Take 112 mcg by mouth every morning. 07/14/21   [provider]  nitroGLYCERIN (NITROSTAT) 0.4 MG SL tablet Place 1 tablet (0.4 mg total) under the tongue every 5 (five) minutes as needed for chest pain. 08/15/17   Caren Macadam, MD  potassium chloride SA (KLOR-CON M) 20 MEQ tablet Take 20 mEq by mouth 2 (two) times daily. 06/10/21   [provider]  sodium bicarbonate 650 MG tablet Take 650 mg by mouth 2 (two) times daily.    [provider]  torsemide (DEMADEX) 20 MG tablet Take 2 tablets (40 mg total) by mouth daily. 07/01/21   Barton Dubois, MD  traZODone (DESYREL) 50 MG tablet Take 50 mg by mouth at bedtime as needed. 06/08/21   [provider]     Allergies:    Allergies  Allergen Reactions   Cedar Swelling        Other Swelling    Teriyaki    Social History:   Social History   Socioeconomic History   Marital status: Legally Separated    Spouse name: Not on file   Number of children: Not on file    Years of education: Not on file   Highest education level: Not on file  Occupational History   Not on file  Tobacco Use   Smoking status: Never   Smokeless tobacco: Never  Vaping Use   Vaping Use: Never used  Substance and Sexual Activity   Alcohol use: No   Drug use: No   Sexual activity: Yes    Birth control/protection: None, Surgical    Comment: tubal  Other Topics Concern   Not on file  Social History Narrative   Lives alone.    Has some friends.    Neighbors.    Social Determinants of Health   Financial Resource Strain: Not on file  Food Insecurity: Not on file  Transportation Needs: Not on file  Physical Activity: Not on file  Stress: Not on file  Social Connections: Not on file  Intimate Partner Violence: Not on file    Family History:   The patient's family history includes Diabetes in her father; Heart attack in her maternal grandfather; Heart disease in her father; Hypertension in her father; Lung cancer in her paternal grandmother; Migraines in her maternal grandfather and mother. There is no history of Colon cancer or Colon polyps.    ROS:  Please see the history of present illness.  All other ROS reviewed and negative.     Physical Exam/Data:   Vitals:   09/10/21 2130 09/10/21 2145 09/10/21 2200 09/10/21 2215  BP: (!) 139/94 (!) 151/91 (!) 141/94 (!) 127/101  Pulse: 76 78 82 77  Resp: 20 (!) _0 Temp:      TempSrc:      SpO2: 90% 92% 92% 93%  Weight:      Height:       No intake or output data in the 24 hours ending 09/10/21 2324    09/10/2021    7:38 PM 07/27/2021    1:04 PM 06/28/2021    5:45 AM  Last 3 Weights  Weight (lbs) 188 lb 0.8 oz 188 lb 182 lb 12.2 oz  Weight (kg) 85.3 kg 85.276 kg 82.9 kg     Body mass index is 35.53 kg/m.  General:  Well nourished, well developed, in mild acute distress HEENT: normal Neck: no JVD Vascular: No carotid bruits; Distal pulses 2+ bilaterally   Cardiac:  normal S1, S2; RRR; no murmur  Lungs:   clear to auscultation bilaterally, no wheezing, rhonchi or rales  Abd: soft, nontender, no hepatomegaly  Ext: no edema Musculoskeletal:  No deformities, BUE and BLE strength normal and equal Skin: warm and dry  Neuro:  CNs 2-12 intact, no focal abnormalities noted Psych:  Normal affect    EKG:  The ECG that was done  was personally reviewed and demonstrates no STEMI criteria as per Dr Claiborne Billings  Relevant CV Studies:  Echo done in March 20, 2021 showed EF of 40 to 45%.  LV-mildly decreased function, global hypokinesis  LHC Jan 2023    Mid RCA lesion is 100% stenosed.  Left to right collaterals.   2nd Mrg lesion is 90% stenosed.   Ost LAD to Mid LAD lesion is 75% stenosed.   1st Diag lesion is 75% stenosed.   LV end diastolic pressure is mildly elevated.   There is no aortic valve stenosis.   Severe, multivessel disease.  Diffuse disease throughout the left system.  Occluded mid RCA stent.  Large right PDA and distal RCA system which fills by collaterals from the left.  Restart heparin in 8 hours.  Discussed with Dr. Martinique.  Will obtain CVTS consult.  Contrast minimize due to renal dysfunction.  Laboratory Data:  High Sensitivity Troponin:   Recent Labs  Lab 09/10/21 2003 09/10/21 2134  TROPONINIHS 260* 369*      Chemistry Recent Labs  Lab 09/10/21 2003  NA 137  K 4.0  CL 114*  CO2 13*  GLUCOSE 256*  BUN 33*  CREATININE 3.61*  CALCIUM 8.1*  GFRNONAA 14*  ANIONGAP 10    Recent Labs  Lab 09/10/21 2003  PROT 6.2*  ALBUMIN 3.4*  AST 16  ALT 17  ALKPHOS 75  BILITOT 0.5   Lipids  Recent Labs  Lab 09/10/21 2003  CHOL 158  TRIG 77  HDL 29*  LDLCALC 114*  CHOLHDL 5.4   Hematology Recent Labs  Lab 09/10/21 2003  WBC 10.8*  RBC 3.12*  HGB 9.6*  HCT 32.0*  MCV 102.6*  MCH 30.8  MCHC 30.0  RDW 15.9*  PLT 257   Thyroid No results for input(s): "TSH", "FREET4" in the last 168 hours. BNPNo results for input(s): "BNP", "PROBNP" in the last 168 hours.   DDimer No results for input(s): "DDIMER" in the last 168 hours.   Radiology/Studies:  DG Chest Port 1 View  Result Date: 09/10/2021 CLINICAL DATA:  Chest pain EXAM: PORTABLE CHEST 1 VIEW COMPARISON:  06/24/2020 FINDINGS: Low lung volumes. Vascular crowding without frank interstitial edema. No pleural effusion or pneumothorax. The heart is top-normal in size for inspiration. IMPRESSION: Low lung volumes. No evidence of acute cardiopulmonary disease. Electronically Signed   By: Julian Hy M.D.   On: 09/10/2021 20:05     Assessment and Plan:   # NSTEMI # CAD # Chest Pain  -Multiple similar presentations for NSTEMI in the past 3-4 months -LHC showed Severe, multivessel disease.  Diffuse disease throughout the left system.  Occluded mid RCA stent.  Large right PDA and distal RCA system which fills by collaterals from the left. Deemed not to be a candidate for PCI or CABG -Again troponin elevated today; trend EKG and troponin -IV Heparin -C/w Aspirin and Plavix -Repeat Echo -Atorvastatin, Zetia and Vascepa -Touch base with CTS/IC to see if anyi intervention can be done -If none, aggressive medical therapy and anti-anginal medications -C/w Imdur. Consider adding Ranolaxine  # Chronic HFrEF -GDMT limited by Kidney function -C/w home torsemide -Echo  # Type 2 diabetes mellitus with hyperglycemia Continue ISS and hypoglycemic protocol Continue Levemir 10 units nightly and adjust dose accordingly  # CKD stage V  Monitor BUN/creatinine  Renally adjust medications, avoid nephrotoxic agents/dehydration/hypotension   # Essential hypertension Continue Coreg, torsemide  # Mixed hyperlipidemia Continue Lipitor, Zetia   # Hypothyroidism Continue Synthroid   # GERD Continue famotidine   For questions or updates, please contact [facility] Please consult [email or web address] for contact info under     Signed, Jaci Lazier, MD  09/10/2021 11:24 PM

## 2021-09-10 NOTE — ED Triage Notes (Signed)
BIB EMS states she was watching tv and started having mid sternal chest pain 10/10 about 4 hrs ago. Took 324 mg ASA and 1 SL nitro. EMS gave '4mg'$  zofran, 1 SL nitro. Pain is 1/10 at this point. HX of MI and stent

## 2021-09-10 NOTE — ED Notes (Signed)
Dr. Claiborne Billings, Cardiologist, at bedside

## 2021-09-10 NOTE — ED Notes (Signed)
STEMI protocol cancelled by Dr. Claiborne Billings

## 2021-09-10 NOTE — ED Provider Notes (Signed)
[facility] EMERGENCY DEPARTMENT Provider Note   CSN: [id number] Arrival date & time: 09/10/21  1931     History {Add pertinent medical, surgical, social history, OB history to HPI:1} Chief Complaint  Patient presents with   Chest Pain    Monica Carlson is a 56 y.o. female.   Chest Pain      Home Medications Prior to Admission medications   Medication Sig Start Date End Date Taking? Authorizing Provider  acetaminophen (TYLENOL) 500 MG tablet Take 1,000-1,500 mg by mouth every 6 (six) hours as needed (migraine).    [provider]  albuterol (VENTOLIN HFA) 108 (90 Base) MCG/ACT inhaler Inhale 2 puffs into the lungs every 4 (four) hours as needed for shortness of breath. 02/27/21   [provider]  aspirin EC 81 MG tablet Take 81 mg by mouth daily. Swallow whole.    [provider]  atorvastatin (LIPITOR) 80 MG tablet Take 1 tablet (80 mg total) by mouth at bedtime. Patient not taking: Reported on 07/27/2021 03/24/21 06/24/21  [place] Donnamarie Poag, DO  Blood Glucose Monitoring Suppl (ACCU-CHEK GUIDE ME) w/Device KIT 1 Piece by Does not apply route as directed. 07/09/18   Cassandria Anger, MD  carvedilol (COREG) 6.25 MG tablet Take 6.25 mg by mouth 2 (two) times daily. 02/09/21   [provider]  clopidogrel (PLAVIX) 75 MG tablet TAKE ONE TABLET BY MOUTH EVERY DAY WITH BREAKFAST Patient not taking: Reported on 07/27/2021 07/10/17   Caren Macadam, MD  ezetimibe (ZETIA) 10 MG tablet Take 1 tablet (10 mg total) by mouth daily. 04/15/21   Sueanne Margarita, MD  famotidine (PEPCID) 20 MG tablet Take by mouth. Patient not taking: Reported on 07/27/2021 06/16/21 06/16/22  [provider]  gabapentin (NEURONTIN) 300 MG capsule Take 300 mg by mouth 2 (two) times daily. 10/11/17   [provider]  glipiZIDE (GLUCOTROL) 10 MG tablet Take 10 mg by mouth daily before breakfast.    [provider]  glucose blood (ACCU-CHEK GUIDE) test  strip Use as instructed 4 x daily. e11.65 07/10/18   Cassandria Anger, MD  glucose blood test strip 1 each by Other route 2 (two) times daily. Use as instructed bid. E11.65 12/18/18   Cassandria Anger, MD  icosapent Ethyl (VASCEPA) 1 g capsule Take 2 capsules (2 g total) by mouth 2 (two) times daily. With food 04/15/21   Sueanne Margarita, MD  insulin detemir (LEVEMIR) 100 UNIT/ML FlexPen Inject 15 Units into the skin at bedtime. 04/23/21   Barton Dubois, MD  Insulin Pen Needle (PEN NEEDLES) 32G X 5 MM MISC Use 1 needle daily to inject insulin as instructed. 04/23/21   Barton Dubois, MD  isosorbide mononitrate (IMDUR) 60 MG 24 hr tablet Take 1 tablet (60 mg total) by mouth daily. 07/27/21 07/22/22  Barrett, Evelene Croon, PA-C  JANUVIA 50 MG tablet TAKE 1 TABLET BY MOUTH EVERY DAY Patient not taking: Reported on 07/27/2021 02/23/21   Brita Romp, NP  Lancets Thin MISC 1 each by Does not apply route 4 (four) times daily. E11.65 Pt has Prodigy meter 10/02/18   Cassandria Anger, MD  levothyroxine (SYNTHROID) 112 MCG tablet Take 112 mcg by mouth every morning. 07/14/21   [provider]  nitroGLYCERIN (NITROSTAT) 0.4 MG SL tablet Place 1 tablet (0.4 mg total) under the tongue every 5 (five) minutes as needed for chest pain. 08/15/17   Caren Macadam, MD  potassium chloride SA (KLOR-CON M) 20  MEQ tablet Take 20 mEq by mouth 2 (two) times daily. 06/10/21   [provider]  sodium bicarbonate 650 MG tablet Take 650 mg by mouth 2 (two) times daily.    [provider]  torsemide (DEMADEX) 20 MG tablet Take 2 tablets (40 mg total) by mouth daily. 07/01/21   Barton Dubois, MD  traZODone (DESYREL) 50 MG tablet Take 50 mg by mouth at bedtime as needed. 06/08/21   [provider]      [place] and Other    Review of Systems   Review of Systems  Cardiovascular:  Positive for chest pain.    Physical Exam Updated Vital Signs BP (!) 151/100 (BP Location: Right  Arm)   Pulse 76   Temp 98.1 F (36.7 C) (Oral)   Resp 15   Ht '5\' 1"'  (1.549 m)   Wt 85.3 kg   SpO2 97%   BMI 35.53 kg/m  Physical Exam  ED Results / Procedures / Treatments   Labs (all labs ordered are listed, but only abnormal results are displayed) Labs Reviewed - No data to display  EKG None  Radiology No results found.  Procedures Procedures  {Document cardiac monitor, telemetry assessment procedure when appropriate:1}  Medications Ordered in ED Medications  heparin injection 4,000 Units (has no administration in time range)    ED Course/ Medical Decision Making/ A&P                           Medical Decision Making Risk Prescription drug management.   ***  {Document critical care time when appropriate:1} {Document review of labs and clinical decision tools ie heart score, Chads2Vasc2 etc:1}  {Document your independent review of radiology images, and any outside records:1} {Document your discussion with family members, caretakers, and with consultants:1} {Document social determinants of health affecting pt's care:1} {Document your decision making why or why not admission, treatments were needed:1} Final Clinical Impression(s) / ED Diagnoses Final diagnoses:  None    Rx / DC Orders ED Discharge Orders     None

## 2021-09-10 NOTE — ED Notes (Signed)
Dr. Oneita Hurt made aware about critical APTT level

## 2021-09-10 NOTE — ED Provider Notes (Incomplete)
[name] EMERGENCY DEPARTMENT Provider Note   CSN: [id number] Arrival date & time: 09/10/21  1931     History {Add pertinent medical, surgical, social history, OB history to HPI:1} Chief Complaint  Patient presents with  . Chest Pain    Monica Carlson is a 56 y.o. female.   Chest Pain      Home Medications Prior to Admission medications   Medication Sig Start Date End Date Taking? Authorizing Provider  acetaminophen (TYLENOL) 500 MG tablet Take 1,000-1,500 mg by mouth every 6 (six) hours as needed (migraine).    [provider]  albuterol (VENTOLIN HFA) 108 (90 Base) MCG/ACT inhaler Inhale 2 puffs into the lungs every 4 (four) hours as needed for shortness of breath. 02/27/21   [provider]  aspirin EC 81 MG tablet Take 81 mg by mouth daily. Swallow whole.    [provider]  atorvastatin (LIPITOR) 80 MG tablet Take 1 tablet (80 mg total) by mouth at bedtime. Patient not taking: Reported on 07/27/2021 03/24/21 06/24/21  [place] Donnamarie Poag, DO  Blood Glucose Monitoring Suppl (ACCU-CHEK GUIDE ME) w/Device KIT 1 Piece by Does not apply route as directed. 07/09/18   Cassandria Anger, MD  carvedilol (COREG) 6.25 MG tablet Take 6.25 mg by mouth 2 (two) times daily. 02/09/21   [provider]  clopidogrel (PLAVIX) 75 MG tablet TAKE ONE TABLET BY MOUTH EVERY DAY WITH BREAKFAST Patient not taking: Reported on 07/27/2021 07/10/17   Caren Macadam, MD  ezetimibe (ZETIA) 10 MG tablet Take 1 tablet (10 mg total) by mouth daily. 04/15/21   Sueanne Margarita, MD  famotidine (PEPCID) 20 MG tablet Take by mouth. Patient not taking: Reported on 07/27/2021 06/16/21 06/16/22  [provider]  gabapentin (NEURONTIN) 300 MG capsule Take 300 mg by mouth 2 (two) times daily. 10/11/17   [provider]  glipiZIDE (GLUCOTROL) 10 MG tablet Take 10 mg by mouth daily before breakfast.    [provider]  glucose blood (ACCU-CHEK GUIDE)  test strip Use as instructed 4 x daily. e11.65 07/10/18   Cassandria Anger, MD  glucose blood test strip 1 each by Other route 2 (two) times daily. Use as instructed bid. E11.65 12/18/18   Cassandria Anger, MD  icosapent Ethyl (VASCEPA) 1 g capsule Take 2 capsules (2 g total) by mouth 2 (two) times daily. With food 04/15/21   Sueanne Margarita, MD  insulin detemir (LEVEMIR) 100 UNIT/ML FlexPen Inject 15 Units into the skin at bedtime. 04/23/21   Barton Dubois, MD  Insulin Pen Needle (PEN NEEDLES) 32G X 5 MM MISC Use 1 needle daily to inject insulin as instructed. 04/23/21   Barton Dubois, MD  isosorbide mononitrate (IMDUR) 60 MG 24 hr tablet Take 1 tablet (60 mg total) by mouth daily. 07/27/21 07/22/22  Barrett, Evelene Croon, PA-C  JANUVIA 50 MG tablet TAKE 1 TABLET BY MOUTH EVERY DAY Patient not taking: Reported on 07/27/2021 02/23/21   Brita Romp, NP  Lancets Thin MISC 1 each by Does not apply route 4 (four) times daily. E11.65 Pt has Prodigy meter 10/02/18   Cassandria Anger, MD  levothyroxine (SYNTHROID) 112 MCG tablet Take 112 mcg by mouth every morning. 07/14/21   [provider]  nitroGLYCERIN (NITROSTAT) 0.4 MG SL tablet Place 1 tablet (0.4 mg total) under the tongue every 5 (five) minutes as needed for chest pain. 08/15/17   Caren Macadam, MD  potassium chloride SA (KLOR-CON M) 20  MEQ tablet Take 20 mEq by mouth 2 (two) times daily. 06/10/21   [provider]  sodium bicarbonate 650 MG tablet Take 650 mg by mouth 2 (two) times daily.    [provider]  torsemide (DEMADEX) 20 MG tablet Take 2 tablets (40 mg total) by mouth daily. 07/01/21   Barton Dubois, MD  traZODone (DESYREL) 50 MG tablet Take 50 mg by mouth at bedtime as needed. 06/08/21   [provider]      [place] and Other    Review of Systems   Review of Systems  Cardiovascular:  Positive for chest pain.    Physical Exam Updated Vital Signs BP (!) 151/100 (BP Location:  Right Arm)   Pulse 76   Temp 98.1 F (36.7 C) (Oral)   Resp 15   Ht '5\' 1"'  (1.549 m)   Wt 85.3 kg   SpO2 97%   BMI 35.53 kg/m  Physical Exam  ED Results / Procedures / Treatments   Labs (all labs ordered are listed, but only abnormal results are displayed) Labs Reviewed - No data to display  EKG None  Radiology No results found.  Procedures Procedures  {Document cardiac monitor, telemetry assessment procedure when appropriate:1}  Medications Ordered in ED Medications  heparin injection 4,000 Units (has no administration in time range)    ED Course/ Medical Decision Making/ A&P                           Medical Decision Making Amount and/or Complexity of Data Reviewed Labs: ordered. Radiology: ordered.  Risk Prescription drug management. Decision regarding hospitalization.   ***  {Document critical care time when appropriate:1} {Document review of labs and clinical decision tools ie heart score, Chads2Vasc2 etc:1}  {Document your independent review of radiology images, and any outside records:1} {Document your discussion with family members, caretakers, and with consultants:1} {Document social determinants of health affecting pt's care:1} {Document your decision making why or why not admission, treatments were needed:1} Final Clinical Impression(s) / ED Diagnoses Final diagnoses:  None    Rx / DC Orders ED Discharge Orders     None

## 2021-09-10 NOTE — Progress Notes (Signed)
ANTICOAGULATION CONSULT NOTE - Initial Consult  Pharmacy Consult for heparin Indication: chest pain/ACS  Allergies  Allergen Reactions   Cedar Swelling        Other Swelling    Teriyaki    Patient Measurements: Height: '5\' 1"'$  (154.9 cm) Weight: 85.3 kg (188 lb 0.8 oz) IBW/kg (Calculated) : 47.8 Heparin Dosing Weight: 67.4kg  Vital Signs: Temp: 98.1 F (36.7 C) (07/21 1938) Temp Source: Oral (07/21 1938) BP: 151/100 (07/21 1938) Pulse Rate: 76 (07/21 1938)  Labs: No results for input(s): "HGB", "HCT", "PLT", "APTT", "LABPROT", "INR", "HEPARINUNFRC", "HEPRLOWMOCWT", "CREATININE", "CKTOTAL", "CKMB", "TROPONINIHS" in the last 72 hours.  CrCl cannot be calculated (Patient's most recent lab result is older than the maximum 21 days allowed.).   Medical History: Past Medical History:  Diagnosis Date   Cervical cancer ([place])    cryotherapy and colposcopy/[place], [place]; 1991   CHF (congestive heart failure) ([place]) 07-2014   Diabetes mellitus without complication ([place]) 8832   High cholesterol    Hypertension    MI (myocardial infarction) ([place])    2004   Stroke ([place]) 09/2014, 2013   Vaginal Pap smear, abnormal    Assessment: 71 YOF presenting with CP, significant hx of CAD, heparin 4000 units IV x 1 given as STEMI protocol and to continue heparin gtt as medical management  Goal of Therapy:  Heparin level 0.3-0.7 units/ml Monitor platelets by anticoagulation protocol: Yes   Plan:  Heparin gtt at 900 units/hr F/u 6 hour heparin level with AM labs F/u LOT  Bertis Ruddy, PharmD Clinical Pharmacist ED Pharmacist Phone # [phone] 09/10/2021 8:01 PM

## 2021-09-10 NOTE — ED Notes (Signed)
Dr. Oneita Hurt made aware of critical troponin

## 2021-09-10 NOTE — Progress Notes (Signed)
Situation: Initial visit for pt Monica Carlson. Chaplain responding to page regarding code STEMI.  Background: Facts: Per MD, [name] had a concerning "EKG" and made a referral to the cardiologist MD. [name] shared that she got her "first stent here in 2004". Family: [name] shared that her boyfriend called 37 for her; she shared that they had been together for 3 years. Feelings: [name] expressed some concern for "what they want to do" but also some reassurance that "I haven't had any problems since my first stent in 2004." Faith: [name] shared that she "wanted prayers" which this chaplain provided at this time.  Actions & Assessments: [name] seems to be understanding of the situation, appreciative of prayers, and anticipating the visit from the cardiologist. Chaplain offered prayer and compassionate presence  Recommendations: Chaplain remains available for follow-up spiritual/emotional support as needed.  Rev. Susanne Borders, MDiv      09/10/21 1900  Clinical Encounter Type  Visited With Patient  Visit Type Initial;ED  Spiritual Encounters  Spiritual Needs Prayer

## 2021-09-11 ENCOUNTER — Other Ambulatory Visit: Payer: Self-pay

## 2021-09-11 ENCOUNTER — Encounter (HOSPITAL_COMMUNITY): Payer: Self-pay | Admitting: Internal Medicine

## 2021-09-11 ENCOUNTER — Inpatient Hospital Stay (HOSPITAL_COMMUNITY): Payer: Medicare Other

## 2021-09-11 DIAGNOSIS — I248 Other forms of acute ischemic heart disease: Secondary | ICD-10-CM | POA: Diagnosis not present

## 2021-09-11 DIAGNOSIS — I214 Non-ST elevation (NSTEMI) myocardial infarction: Secondary | ICD-10-CM

## 2021-09-11 LAB — CBC
HCT: 31.1 % — ABNORMAL LOW (ref 36.0–46.0)
Hemoglobin: 9.7 g/dL — ABNORMAL LOW (ref 12.0–15.0)
MCH: 30.5 pg (ref 26.0–34.0)
MCHC: 31.2 g/dL (ref 30.0–36.0)
MCV: 97.8 fL (ref 80.0–100.0)
Platelets: 261 10*3/uL (ref 150–400)
RBC: 3.18 MIL/uL — ABNORMAL LOW (ref 3.87–5.11)
RDW: 15.8 % — ABNORMAL HIGH (ref 11.5–15.5)
WBC: 11 10*3/uL — ABNORMAL HIGH (ref 4.0–10.5)
nRBC: 0 % (ref 0.0–0.2)

## 2021-09-11 LAB — PROTIME-INR
INR: 1.2 (ref 0.8–1.2)
Prothrombin Time: 15.3 seconds — ABNORMAL HIGH (ref 11.4–15.2)

## 2021-09-11 LAB — BASIC METABOLIC PANEL
Anion gap: 6 (ref 5–15)
BUN: 33 mg/dL — ABNORMAL HIGH (ref 6–20)
CO2: 16 mmol/L — ABNORMAL LOW (ref 22–32)
Calcium: 8.3 mg/dL — ABNORMAL LOW (ref 8.9–10.3)
Chloride: 111 mmol/L (ref 98–111)
Creatinine, Ser: 3.59 mg/dL — ABNORMAL HIGH (ref 0.44–1.00)
GFR, Estimated: 14 mL/min — ABNORMAL LOW (ref >60)
Glucose, Bld: 145 mg/dL — ABNORMAL HIGH (ref 70–99)
Potassium: 3.7 mmol/L (ref 3.5–5.1)
Sodium: 133 mmol/L — ABNORMAL LOW (ref 135–145)

## 2021-09-11 LAB — ECHOCARDIOGRAM COMPLETE
Area-P 1/2: 6.9 cm2
Calc EF: 30.2 %
Height: 61 in
MV M vel: 4.82 m/s
MV Peak grad: 92.9 mmHg
MV VTI: 2.36 cm2
Radius: 0.6 cm
S' Lateral: 5 cm
Single Plane A2C EF: 23.9 %
Single Plane A4C EF: 32.7 %
Weight: 3008.84 oz

## 2021-09-11 LAB — GLUCOSE, CAPILLARY
Glucose-Capillary: 102 mg/dL — ABNORMAL HIGH (ref 70–99)
Glucose-Capillary: 127 mg/dL — ABNORMAL HIGH (ref 70–99)

## 2021-09-11 LAB — HEPARIN LEVEL (UNFRACTIONATED)
Heparin Unfractionated: 0.39 IU/mL (ref 0.30–0.70)
Heparin Unfractionated: 0.4 IU/mL (ref 0.30–0.70)

## 2021-09-11 LAB — CBG MONITORING, ED
Glucose-Capillary: 134 mg/dL — ABNORMAL HIGH (ref 70–99)
Glucose-Capillary: 153 mg/dL — ABNORMAL HIGH (ref 70–99)

## 2021-09-11 MED ORDER — SODIUM BICARBONATE 650 MG PO TABS
650.0000 mg | ORAL_TABLET | Freq: Two times a day (BID) | ORAL | Status: DC
  Administered 2021-09-11 – 2021-09-14 (×6): 650 mg via ORAL
  Filled 2021-09-11 (×6): qty 1

## 2021-09-11 MED ORDER — GABAPENTIN 300 MG PO CAPS
300.0000 mg | ORAL_CAPSULE | Freq: Two times a day (BID) | ORAL | Status: DC
  Administered 2021-09-11 – 2021-09-14 (×6): 300 mg via ORAL
  Filled 2021-09-11 (×6): qty 1

## 2021-09-11 MED ORDER — ICOSAPENT ETHYL 1 G PO CAPS
2.0000 g | ORAL_CAPSULE | Freq: Two times a day (BID) | ORAL | Status: DC
  Administered 2021-09-11 – 2021-09-13 (×3): 2 g via ORAL
  Filled 2021-09-11 (×9): qty 2

## 2021-09-11 MED ORDER — ALBUTEROL SULFATE (2.5 MG/3ML) 0.083% IN NEBU: 2.5000 mg | INHALATION_SOLUTION | RESPIRATORY_TRACT | Status: DC | PRN

## 2021-09-11 MED ORDER — TORSEMIDE 20 MG PO TABS
40.0000 mg | ORAL_TABLET | Freq: Every day | ORAL | Status: DC
  Administered 2021-09-11 – 2021-09-14 (×4): 40 mg via ORAL
  Filled 2021-09-11 (×4): qty 2

## 2021-09-11 MED ORDER — EZETIMIBE 10 MG PO TABS
10.0000 mg | ORAL_TABLET | Freq: Every day | ORAL | Status: DC
Start: 2021-09-11 — End: 2021-09-14
  Administered 2021-09-11 – 2021-09-14 (×4): 10 mg via ORAL
  Filled 2021-09-11 (×4): qty 1

## 2021-09-11 MED ORDER — ISOSORBIDE MONONITRATE ER 60 MG PO TB24
60.0000 mg | ORAL_TABLET | Freq: Every day | ORAL | Status: DC
  Administered 2021-09-11 – 2021-09-12 (×2): 60 mg via ORAL
  Filled 2021-09-11 (×2): qty 1

## 2021-09-11 MED ORDER — CARVEDILOL 6.25 MG PO TABS
6.2500 mg | ORAL_TABLET | Freq: Two times a day (BID) | ORAL | Status: DC
Start: 2021-09-11 — End: 2021-09-14
  Administered 2021-09-11 – 2021-09-14 (×6): 6.25 mg via ORAL
  Filled 2021-09-11 (×6): qty 1

## 2021-09-11 MED ORDER — TRAZODONE HCL 50 MG PO TABS
50.0000 mg | ORAL_TABLET | Freq: Every evening | ORAL | Status: DC | PRN
  Filled 2021-09-11: qty 1

## 2021-09-11 MED ORDER — PERFLUTREN LIPID MICROSPHERE
1.0000 mL | INTRAVENOUS | Status: AC | PRN
  Administered 2021-09-11: 3 mL via INTRAVENOUS

## 2021-09-11 MED ORDER — CLOPIDOGREL BISULFATE 75 MG PO TABS
75.0000 mg | ORAL_TABLET | Freq: Every day | ORAL | Status: DC
  Administered 2021-09-11 – 2021-09-14 (×4): 75 mg via ORAL
  Filled 2021-09-11 (×4): qty 1

## 2021-09-11 MED ORDER — PROCHLORPERAZINE EDISYLATE 10 MG/2ML IJ SOLN
10.0000 mg | Freq: Four times a day (QID) | INTRAMUSCULAR | Status: DC | PRN
  Administered 2021-09-11: 10 mg via INTRAVENOUS
  Filled 2021-09-11: qty 2

## 2021-09-11 MED ORDER — LEVOTHYROXINE SODIUM 112 MCG PO TABS
112.0000 ug | ORAL_TABLET | Freq: Every morning | ORAL | Status: DC
Start: 2021-09-12 — End: 2021-09-14
  Administered 2021-09-12 – 2021-09-14 (×3): 112 ug via ORAL
  Filled 2021-09-11 (×3): qty 1

## 2021-09-11 MED ORDER — NITROGLYCERIN IN D5W 200-5 MCG/ML-% IV SOLN
0.0000 ug/min | INTRAVENOUS | Status: DC
  Administered 2021-09-11: 5 ug/min via INTRAVENOUS
  Filled 2021-09-11 (×2): qty 250

## 2021-09-11 MED ORDER — INSULIN DETEMIR 100 UNIT/ML ~~LOC~~ SOLN
15.0000 [IU] | Freq: Every day | SUBCUTANEOUS | Status: DC
  Administered 2021-09-11 – 2021-09-13 (×3): 15 [IU] via SUBCUTANEOUS
  Filled 2021-09-11 (×6): qty 0.15

## 2021-09-11 NOTE — Progress Notes (Signed)
Progress Note  Patient Name: Monica Carlson Date of Encounter: 09/11/2021  [facility] Cardiologist: Carlyle Dolly, MD   Subjective   Feeling poorly, continues to have chest pain, right arm hurts where blood pressure cuff is located.  Inpatient Medications    Scheduled Meds:  aspirin EC  81 mg Oral Daily   atorvastatin  80 mg Oral Daily   glucose blood  1 each Other BID   insulin aspart  0-9 Units Subcutaneous TID WC   Continuous Infusions:  sodium chloride Stopped (09/11/21 0409)   heparin 900 Units/hr (09/11/21 0727)   PRN Meds: acetaminophen, nitroGLYCERIN   Vital Signs    Vitals:   09/11/21 0345 09/11/21 0515 09/11/21 0732 09/11/21 0736  BP: (!) 139/100 (!) 123/97  (!) 135/101  Pulse: 75 76  80  Resp: (!) '23 19  16  '$ Temp:   98.4 F (36.9 C)   TempSrc:      SpO2: 97% 94%  93%  Weight:      Height:        Intake/Output Summary (Last 24 hours) at 09/11/2021 0856 Last data filed at 09/11/2021 0727 Gross per 24 hour  Intake 72.92 ml  Output --  Net 72.92 ml      09/10/2021    7:38 PM 07/27/2021    1:04 PM 06/28/2021    5:45 AM  Last 3 Weights  Weight (lbs) 188 lb 0.8 oz 188 lb 182 lb 12.2 oz  Weight (kg) 85.3 kg 85.276 kg 82.9 kg      Telemetry    Sinus rhythm with nonsustained VT- Personally Reviewed  ECG    Sinus rhythm PVC Nonspecific IVCD- Personally Reviewed  Physical Exam   GEN: Appears in pain Neck: No JVD Cardiac: RRR, no murmurs, rubs, or gallops.  Respiratory: Clear to auscultation bilaterally. GI: Soft, nontender, non-distended  MS: Trace edema; No deformity. Neuro:  Nonfocal  Psych: Normal affect   Labs    High Sensitivity Troponin:   Recent Labs  Lab 09/10/21 2003 09/10/21 2134  TROPONINIHS 260* 369*     Chemistry Recent Labs  Lab 09/10/21 2003 09/11/21 0513  NA 137 133*  K 4.0 3.7  CL 114* 111  CO2 13* 16*  GLUCOSE 256* 145*  BUN 33* 33*  CREATININE 3.61* 3.59*  CALCIUM 8.1* 8.3*  PROT 6.2*  --   ALBUMIN  3.4*  --   AST 16  --   ALT 17  --   ALKPHOS 75  --   BILITOT 0.5  --   GFRNONAA 14* 14*  ANIONGAP 10 6    Lipids  Recent Labs  Lab 09/10/21 2003  CHOL 158  TRIG 77  HDL 29*  LDLCALC 114*  CHOLHDL 5.4    Hematology Recent Labs  Lab 09/10/21 2003 09/11/21 0513  WBC 10.8* 11.0*  RBC 3.12* 3.18*  HGB 9.6* 9.7*  HCT 32.0* 31.1*  MCV 102.6* 97.8  MCH 30.8 30.5  MCHC 30.0 31.2  RDW 15.9* 15.8*  PLT 257 261   Thyroid No results for input(s): "TSH", "FREET4" in the last 168 hours.  BNPNo results for input(s): "BNP", "PROBNP" in the last 168 hours.  DDimer No results for input(s): "DDIMER" in the last 168 hours.   Radiology    DG Chest Port 1 View  Result Date: 09/10/2021 CLINICAL DATA:  Chest pain EXAM: PORTABLE CHEST 1 VIEW COMPARISON:  06/24/2020 FINDINGS: Low lung volumes. Vascular crowding without frank interstitial edema. No pleural effusion or pneumothorax. The  heart is top-normal in size for inspiration. IMPRESSION: Low lung volumes. No evidence of acute cardiopulmonary disease. Electronically Signed   By: Julian Hy M.D.   On: 09/10/2021 20:05    Cardiac Studies   Echo pending  Patient Profile     56 y.o. female pmh sx for T2DM, hypertension, hyperlipidemia, chronic systolic/diastolic heart failure, CAD, CKD stage V (baseline creatinine at 3.2-3.6)  who is being seen for the evaluation of NSTEMI.   Assessment & Plan    Principal Problem:   NSTEMI (non-ST elevated myocardial infarction) ([place]) Active Problems:   Type 2 diabetes with nephropathy ([place])   Coronary artery disease involving native coronary artery of native heart without angina pectoris   Essential hypertension, benign   Class 2 obesity   Stented coronary artery   Hypertensive cardiomyopathy (HCC)   Hypothyroidism   GERD (gastroesophageal reflux disease)  -Multiple similar presentations for NSTEMI in the past 3-4 months -LHC showed Severe, multivessel disease.  Diffuse disease  throughout the left system.  Occluded mid RCA stent.  Large right PDA and distal RCA system which fills by collaterals from the left. Deemed not to be a candidate for PCI or CABG -Again troponin elevated today; trend EKG and troponin -IV Heparin -C/w Aspirin and Plavix -Repeat Echo -Atorvastatin, Zetia and Vascepa -We will continue on IV heparin through the weekend and have her case reviewed by interventional cardiology Monday morning for potential intervention though it seems she has not been a candidate in the past. -If none, aggressive medical therapy and anti-anginal medications -C/w Imdur. Consider adding Ranolaxine -We will add IV nitroglycerin at this time given ongoing chest discomfort and we will attempt to titrate medications.  Blood pressure moderately elevated, this may assist with this finding as well.   # Chronic HFrEF -GDMT limited by Kidney function -C/w home torsemide, does not seem to be in decompensated heart failure. -Echo   # Type 2 diabetes mellitus with hyperglycemia Continue ISS and hypoglycemic protocol Continue Levemir 10 units nightly and adjust dose accordingly  # CKD stage V  Monitor BUN/creatinine  Renally adjust medications, avoid nephrotoxic agents/dehydration/hypotension If renal function worsens still, will involve nephrology.    # Essential hypertension Continue Coreg, torsemide  # Mixed hyperlipidemia Continue Lipitor, Zetia   # Hypothyroidism Continue Synthroid   # GERD Continue famotidine   For questions or updates, please contact [facility] Please consult [email or web address] for contact info under        Signed, Elouise Munroe, MD  09/11/2021, 8:56 AM

## 2021-09-11 NOTE — Progress Notes (Signed)
ANTICOAGULATION CONSULT NOTE - Follow Up Consult  Pharmacy Consult for heparin Indication:  NSTEMI  Labs: Recent Labs    09/10/21 2003 09/10/21 2134 09/11/21 0513  HGB 9.6*  --  9.7*  HCT 32.0*  --  31.1*  PLT 257  --  261  APTT >200*  --   --   LABPROT 16.3*  --  15.3*  INR 1.3*  --  1.2  HEPARINUNFRC  --   --  0.39  CREATININE 3.61*  --  3.59*  TROPONINIHS 260* 369*  --     Assessment/Plan:  56yo female therapeutic on heparin with initial dosing for NSTEMI. Will continue infusion at current rate of 900 units/hr and confirm stable with additional level.   Wynona Neat, PharmD, BCPS  09/11/2021,6:28 AM

## 2021-09-11 NOTE — Plan of Care (Signed)
  Problem: Education: Goal: Understanding of cardiac disease, CV risk reduction, and recovery process will improve Outcome: Progressing   Problem: Education: Goal: Individualized Educational Video(s) Outcome: Progressing   Problem: Activity: Goal: Ability to tolerate increased activity will improve Outcome: Progressing   Problem: Cardiac: Goal: Ability to achieve and maintain adequate cardiovascular perfusion will improve Outcome: Progressing

## 2021-09-11 NOTE — Progress Notes (Signed)
ANTICOAGULATION CONSULT NOTE   Pharmacy Consult for heparin Indication: chest pain/ACS  Allergies  Allergen Reactions   Cedar Swelling        Other Swelling    Teriyaki    Patient Measurements: Height: '5\' 1"'$  (154.9 cm) Weight: 85.3 kg (188 lb 0.8 oz) IBW/kg (Calculated) : 47.8 Heparin Dosing Weight: 67.4kg  Vital Signs: Temp: 98.7 F (37.1 C) (07/22 1136) Temp Source: Oral (07/22 1136) BP: 136/88 (07/22 1215) Pulse Rate: 78 (07/22 1215)  Labs: Recent Labs    09/10/21 2003 09/10/21 2134 09/11/21 0513 09/11/21 1121  HGB 9.6*  --  9.7*  --   HCT 32.0*  --  31.1*  --   PLT 257  --  261  --   APTT >200*  --   --   --   LABPROT 16.3*  --  15.3*  --   INR 1.3*  --  1.2  --   HEPARINUNFRC  --   --  0.39 0.40  CREATININE 3.61*  --  3.59*  --   TROPONINIHS 260* 369*  --   --     Estimated Creatinine Clearance: 17.3 mL/min (A) (by C-G formula based on SCr of 3.59 mg/dL (H)).   Medical History: Past Medical History:  Diagnosis Date   Cervical cancer ([place])    cryotherapy and colposcopy/[place]; 1991   CHF (congestive heart failure) ([place]) 07-2014   Diabetes mellitus without complication ([place]) 1610   High cholesterol    Hypertension    MI (myocardial infarction) ([employer])    2004   Stroke ([place]) 09/2014, 2013   Vaginal Pap smear, abnormal    Assessment: 69 YOF presenting with CP, significant hx of CAD, heparin 4000 units IV x 1 given as STEMI protocol and to continue heparin gtt as medical management  Heparin level remains therapeutic on 900 units/hr  Goal of Therapy:  Heparin level 0.3-0.7 units/ml Monitor platelets by anticoagulation protocol: Yes   Plan:  Continue heparin gtt at 900 units/hr Daily heparin level, CBC, s/s bleeding F/u LOT  Bertis Ruddy, PharmD Clinical Pharmacist ED Pharmacist Phone # [phone] 09/11/2021 12:39 PM

## 2021-09-12 LAB — BASIC METABOLIC PANEL
Anion gap: 7 (ref 5–15)
BUN: 36 mg/dL — ABNORMAL HIGH (ref 6–20)
CO2: 18 mmol/L — ABNORMAL LOW (ref 22–32)
Calcium: 8.2 mg/dL — ABNORMAL LOW (ref 8.9–10.3)
Chloride: 112 mmol/L — ABNORMAL HIGH (ref 98–111)
Creatinine, Ser: 3.82 mg/dL — ABNORMAL HIGH (ref 0.44–1.00)
GFR, Estimated: 13 mL/min — ABNORMAL LOW (ref >60)
Glucose, Bld: 153 mg/dL — ABNORMAL HIGH (ref 70–99)
Potassium: 4 mmol/L (ref 3.5–5.1)
Sodium: 137 mmol/L (ref 135–145)

## 2021-09-12 LAB — CBC
HCT: 28.5 % — ABNORMAL LOW (ref 36.0–46.0)
Hemoglobin: 8.9 g/dL — ABNORMAL LOW (ref 12.0–15.0)
MCH: 30.8 pg (ref 26.0–34.0)
MCHC: 31.2 g/dL (ref 30.0–36.0)
MCV: 98.6 fL (ref 80.0–100.0)
Platelets: 259 10*3/uL (ref 150–400)
RBC: 2.89 MIL/uL — ABNORMAL LOW (ref 3.87–5.11)
RDW: 15.7 % — ABNORMAL HIGH (ref 11.5–15.5)
WBC: 11.7 10*3/uL — ABNORMAL HIGH (ref 4.0–10.5)
nRBC: 0.3 % — ABNORMAL HIGH (ref 0.0–0.2)

## 2021-09-12 LAB — GLUCOSE, CAPILLARY
Glucose-Capillary: 112 mg/dL — ABNORMAL HIGH (ref 70–99)
Glucose-Capillary: 126 mg/dL — ABNORMAL HIGH (ref 70–99)
Glucose-Capillary: 127 mg/dL — ABNORMAL HIGH (ref 70–99)
Glucose-Capillary: 148 mg/dL — ABNORMAL HIGH (ref 70–99)
Glucose-Capillary: 192 mg/dL — ABNORMAL HIGH (ref 70–99)

## 2021-09-12 LAB — TROPONIN I (HIGH SENSITIVITY): Troponin I (High Sensitivity): 22550 ng/L (ref <18)

## 2021-09-12 LAB — HEPARIN LEVEL (UNFRACTIONATED)
Heparin Unfractionated: <0.1 IU/mL — ABNORMAL LOW (ref 0.30–0.70)
Heparin Unfractionated: <0.1 IU/mL — ABNORMAL LOW (ref 0.30–0.70)
Heparin Unfractionated: <0.1 IU/mL — ABNORMAL LOW (ref 0.30–0.70)

## 2021-09-12 LAB — MAGNESIUM: Magnesium: 2 mg/dL (ref 1.7–2.4)

## 2021-09-12 MED ORDER — ISOSORBIDE MONONITRATE ER 60 MG PO TB24
90.0000 mg | ORAL_TABLET | Freq: Every day | ORAL | Status: DC
  Administered 2021-09-13 – 2021-09-14 (×2): 90 mg via ORAL
  Filled 2021-09-12 (×2): qty 1

## 2021-09-12 MED ORDER — ISOSORBIDE DINITRATE 10 MG PO TABS: 30.0000 mg | ORAL_TABLET | Freq: Once | ORAL | Status: DC

## 2021-09-12 MED ORDER — HEPARIN BOLUS VIA INFUSION
2600.0000 [IU] | Freq: Once | INTRAVENOUS | Status: AC
  Administered 2021-09-12: 2600 [IU] via INTRAVENOUS
  Filled 2021-09-12: qty 2600

## 2021-09-12 MED ORDER — ISOSORBIDE MONONITRATE ER 30 MG PO TB24
30.0000 mg | ORAL_TABLET | Freq: Once | ORAL | Status: AC
  Administered 2021-09-12: 30 mg via ORAL
  Filled 2021-09-12: qty 1

## 2021-09-12 NOTE — Progress Notes (Signed)
ANTICOAGULATION CONSULT NOTE   Pharmacy Consult for heparin Indication: chest pain/ACS  Allergies  Allergen Reactions   Cedar Swelling        Other Swelling    Teriyaki    Patient Measurements: Height: '5\' 1"'$  (154.9 cm) Weight: 88 kg (194 lb) (SCALE B) IBW/kg (Calculated) : 47.8 Heparin Dosing Weight: 67.4kg  Vital Signs: Temp: 97.8 F (36.6 C) (07/23 0416) Temp Source: Axillary (07/23 0416) BP: 119/74 (07/23 0823) Pulse Rate: 73 (07/23 0823)  Labs: Recent Labs    09/10/21 2003 09/10/21 2003 09/10/21 2134 09/11/21 0513 09/11/21 1121 09/12/21 0457 09/12/21 0654  HGB 9.6*  --   --  9.7*  --  8.9*  --   HCT 32.0*  --   --  31.1*  --  28.5*  --   PLT 257  --   --  261  --  259  --   APTT >200*  --   --   --   --   --   --   LABPROT 16.3*  --   --  15.3*  --   --   --   INR 1.3*  --   --  1.2  --   --   --   HEPARINUNFRC  --    < >  --  0.39 0.40 <0.10* <0.10*  CREATININE 3.61*  --   --  3.59*  --   --   --   TROPONINIHS 260*  --  369*  --   --   --   --    < > = values in this interval not displayed.     Estimated Creatinine Clearance: 17.7 mL/min (A) (by C-G formula based on SCr of 3.59 mg/dL (H)).   Medical History: Past Medical History:  Diagnosis Date   Cervical cancer ([place])    cryotherapy and colposcopy/[place]; 1991   CHF (congestive heart failure) ([place]) 07-2014   Diabetes mellitus without complication ([place]) 9211   High cholesterol    Hypertension    MI (myocardial infarction) ([place])    2004   Stroke ([place]) 09/2014, 2013   Vaginal Pap smear, abnormal    Assessment: 45 YOF presenting with CP, significant hx of CAD, heparin 4000 units IV x 1 given as STEMI protocol and to continue heparin gtt as medical management  Daily heparin level came back as undetectable, and recheck was also undetectable. The previous two heparin levels were both therapeutic. Spoke with RN and there were issues with heparin infusion overnight, which resulted in heparin  being paused several times. Planning for new PIV placement 7/23 am.   Goal of Therapy:  Heparin level 0.3-0.7 units/ml Monitor platelets by anticoagulation protocol: Yes   Plan:  Continue heparin gtt at 900 units/hr Obtain heparin level 6 hours after new PIV placed Daily heparin level, CBC, s/s bleeding F/u LOT  Eliseo Gum, PharmD PGY1 Pharmacy Resident   09/12/2021  8:46 AM

## 2021-09-12 NOTE — Plan of Care (Signed)
  Problem: Education: Goal: Understanding of cardiac disease, CV risk reduction, and recovery process will improve Outcome: Progressing   Problem: Activity: Goal: Ability to tolerate increased activity will improve Outcome: Progressing   Problem: Cardiac: Goal: Ability to achieve and maintain adequate cardiovascular perfusion will improve Outcome: Progressing   Problem: Health Behavior/Discharge Planning: Goal: Ability to safely manage health-related needs after discharge will improve Outcome: Progressing   Problem: Fluid Volume: Goal: Ability to maintain a balanced intake and output will improve Outcome: Progressing   Problem: Metabolic: Goal: Ability to maintain appropriate glucose levels will improve Outcome: Progressing

## 2021-09-12 NOTE — Progress Notes (Signed)
RN notified by lab staff about patient's critical troponin result of 22,550 at 1253. On call MD Dr Acie Fredrickson notified at 67. No new orders noted. Patient stable, no complaints of pain or discomfort. Remains of heparin and nitroglycerine drips. Marcille Blanco, RN

## 2021-09-12 NOTE — Progress Notes (Signed)
ANTICOAGULATION CONSULT NOTE   Pharmacy Consult for heparin Indication: chest pain/ACS  Allergies  Allergen Reactions   Cedar Swelling        Other Swelling    Teriyaki    Patient Measurements: Height: '5\' 1"'$  (154.9 cm) Weight: 88 kg (194 lb) (SCALE B) IBW/kg (Calculated) : 47.8 Heparin Dosing Weight: 67.4kg  Vital Signs: BP: 119/74 (07/23 0823) Pulse Rate: 73 (07/23 0823)  Labs: Recent Labs    09/10/21 2003 09/10/21 2134 09/11/21 0513 09/11/21 1121 09/12/21 0457 09/12/21 0654 09/12/21 1031 09/12/21 1527  HGB 9.6*  --  9.7*  --  8.9*  --   --   --   HCT 32.0*  --  31.1*  --  28.5*  --   --   --   PLT 257  --  261  --  259  --   --   --   APTT >200*  --   --   --   --   --   --   --   LABPROT 16.3*  --  15.3*  --   --   --   --   --   INR 1.3*  --  1.2  --   --   --   --   --   HEPARINUNFRC  --   --  0.39   < > <0.10* <0.10*  --  <0.10*  CREATININE 3.61*  --  3.59*  --   --   --  3.82*  --   TROPONINIHS 260* 369*  --   --   --   --  22,550*  --    < > = values in this interval not displayed.     Estimated Creatinine Clearance: 16.6 mL/min (A) (by C-G formula based on SCr of 3.82 mg/dL (H)).   Medical History: Past Medical History:  Diagnosis Date   Cervical cancer ([place])    cryotherapy and colposcopy/[place], [place]; 1991   CHF (congestive heart failure) ([place]) 07-2014   Diabetes mellitus without complication ([place]) 7619   High cholesterol    Hypertension    MI (myocardial infarction) ([place])    2004   Stroke ([place]) 09/2014, 2013   Vaginal Pap smear, abnormal    Assessment: 1 YOF presenting with CP, significant hx of CAD. Heparin 4000 units IV x1 given as STEMI protocol. Pharmacy has been consulted to continue heparin gtt as medical management.   Heparin level undetectable at <0.10. Per RN, no problems with infusion or bleeding at the IV site. CBC stable.   Goal of Therapy:  Heparin level 0.3-0.7 units/ml Monitor platelets by anticoagulation protocol:  Yes   Plan:  Give heparin 2600 units x1 Increase heparin gtt to 1200 units/hr F/u heparin level in 8 hrs Monitor daily heparin level, CBC, s/sx bleeding  Louanne Belton, PharmD, [facility] PGY1 Pharmacy Resident 09/12/2021 4:25 PM

## 2021-09-12 NOTE — Plan of Care (Signed)
  Problem: Education: Goal: Understanding of cardiac disease, CV risk reduction, and recovery process will improve 09/12/2021 0738 by Marcille Blanco, RN Outcome: Progressing 09/11/2021 1742 by Marcille Blanco, RN Outcome: Progressing   Problem: Education: Goal: Individualized Educational Video(s) 09/12/2021 0738 by Marcille Blanco, RN Outcome: Progressing 09/11/2021 1742 by Marcille Blanco, RN Outcome: Progressing   Problem: Activity: Goal: Ability to tolerate increased activity will improve 09/12/2021 0738 by Marcille Blanco, RN Outcome: Progressing 09/11/2021 1742 by Marcille Blanco, RN Outcome: Progressing   Problem: Cardiac: Goal: Ability to achieve and maintain adequate cardiovascular perfusion will improve 09/12/2021 0738 by Marcille Blanco, RN Outcome: Progressing 09/11/2021 1742 by Marcille Blanco, RN Outcome: Progressing

## 2021-09-12 NOTE — Progress Notes (Signed)
Progress Note  Patient Name: Monica Carlson Date of Encounter: 09/12/2021  [facility] Cardiologist: Carlyle Dolly, MD   Subjective   Feeling better today. Mild DOE getting up to bedside commode. Continues on IV nitro  Inpatient Medications    Scheduled Meds:  aspirin EC  81 mg Oral Daily   atorvastatin  80 mg Oral Daily   carvedilol  6.25 mg Oral BID WC   clopidogrel  75 mg Oral Daily   ezetimibe  10 mg Oral Daily   gabapentin  300 mg Oral BID   icosapent Ethyl  2 g Oral BID WC   insulin aspart  0-9 Units Subcutaneous TID WC   insulin detemir  15 Units Subcutaneous QHS   isosorbide mononitrate  60 mg Oral Daily   levothyroxine  112 mcg Oral q morning   sodium bicarbonate  650 mg Oral BID   torsemide  40 mg Oral Daily   Continuous Infusions:  sodium chloride Stopped (09/11/21 0409)   heparin 900 Units/hr (09/12/21 0354)   nitroGLYCERIN 10 mcg/min (09/11/21 1317)   PRN Meds: acetaminophen, albuterol, nitroGLYCERIN, prochlorperazine, traZODone   Vital Signs    Vitals:   09/11/21 2100 09/12/21 0026 09/12/21 0416 09/12/21 0823  BP: (!) 147/95 108/79 114/75 119/74  Pulse: 83 70 72 73  Resp: '20 20  18  '$ Temp: 98.8 F (37.1 C) (!) 97.5 F (36.4 C) 97.8 F (36.6 C)   TempSrc: Oral Oral Axillary   SpO2: 94% 94% 98% 98%  Weight:   88 kg   Height:        Intake/Output Summary (Last 24 hours) at 09/12/2021 1047 Last data filed at 09/12/2021 0029 Gross per 24 hour  Intake 368.34 ml  Output 300 ml  Net 68.34 ml      09/12/2021    4:16 AM 09/11/2021    4:54 PM 09/10/2021    7:38 PM  Last 3 Weights  Weight (lbs) 194 lb 194 lb 188 lb 0.8 oz  Weight (kg) 87.998 kg 87.998 kg 85.3 kg      Telemetry    Sinus rhythm with nonsustained VT- Personally Reviewed  ECG    Sinus rhythm PVC Nonspecific IVCD- Personally Reviewed  Physical Exam   GEN: Resting Neck: No JVD Cardiac: RRR, systolic murmur at apex, 2/6 Respiratory: Clear to auscultation bilaterally. GI:  Soft, nontender, non-distended  MS: Trace edema; No deformity. Neuro:  Nonfocal  Psych: Normal affect   Labs    High Sensitivity Troponin:   Recent Labs  Lab 09/10/21 2003 09/10/21 2134  TROPONINIHS 260* 369*     Chemistry Recent Labs  Lab 09/10/21 2003 09/11/21 0513  NA 137 133*  K 4.0 3.7  CL 114* 111  CO2 13* 16*  GLUCOSE 256* 145*  BUN 33* 33*  CREATININE 3.61* 3.59*  CALCIUM 8.1* 8.3*  PROT 6.2*  --   ALBUMIN 3.4*  --   AST 16  --   ALT 17  --   ALKPHOS 75  --   BILITOT 0.5  --   GFRNONAA 14* 14*  ANIONGAP 10 6    Lipids  Recent Labs  Lab 09/10/21 2003  CHOL 158  TRIG 77  HDL 29*  LDLCALC 114*  CHOLHDL 5.4    Hematology Recent Labs  Lab 09/10/21 2003 09/11/21 0513 09/12/21 0457  WBC 10.8* 11.0* 11.7*  RBC 3.12* 3.18* 2.89*  HGB 9.6* 9.7* 8.9*  HCT 32.0* 31.1* 28.5*  MCV 102.6* 97.8 98.6  MCH 30.8 30.5  30.8  MCHC 30.0 31.2 31.2  RDW 15.9* 15.8* 15.7*  PLT 257 261 259   Thyroid No results for input(s): "TSH", "FREET4" in the last 168 hours.  BNPNo results for input(s): "BNP", "PROBNP" in the last 168 hours.  DDimer No results for input(s): "DDIMER" in the last 168 hours.   Radiology    ECHOCARDIOGRAM COMPLETE  Result Date: 09/11/2021    ECHOCARDIOGRAM REPORT   Patient Name:   Monica Carlson Date of Exam: 09/11/2021 Medical Rec #:  [id number]    Height:       61.0 in Accession #:    [id number]   Weight:       188.1 lb Date of Birth:  Jan 26, 1966   BSA:          1.840 m Patient Age:    56 years     BP:           141/98 mmHg Patient Gender: F            HR:           79 bpm. Exam Location:  Inpatient Procedure: 2D Echo, Cardiac Doppler, Color Doppler and Intracardiac            Opacification Agent Indications:    Acute ischemic heart disease, unspecified I24.9  History:        Patient has prior history of Echocardiogram examinations, most                 recent 06/25/2021. CHF, CAD, NSTEMI and Previous Myocardial                 Infarction, Stroke; Risk  Factors:Hypertension, Diabetes and                 Dyslipidemia. Chronic kidney disease.  Sonographer:    Darlina Sicilian RDCS Referring Phys: [id number] [place]  1. Left ventricular ejection fraction, by estimation, is 25 to 30%. The left ventricle has severely decreased function. The left ventricle demonstrates global hypokinesis. The left ventricular internal cavity size was moderately dilated. Left ventricular diastolic function could not be evaluated.  2. Right ventricular systolic function is moderately reduced. The right ventricular size is normal. There is moderately elevated pulmonary artery systolic pressure. The estimated right ventricular systolic pressure is 59.9 mmHg.  3. Pulmonary vein systolic blunting of right sided veins. 2D VC 0.4. Difficult 2D PISA assessment. The mitral valve is normal in structure. Moderate to severe mitral valve regurgitation. Ventricular function etiology No evidence of mitral stenosis.  4. The aortic valve was not well visualized. There is mild calcification of the aortic valve. There is mild thickening of the aortic valve. Aortic valve regurgitation is not visualized. Aortic valve sclerosis is present, with no evidence of aortic valve  stenosis. Comparison(s): Worsened LV size, function and mitral regurgitation. Conclusion(s)/Recommendation(s): If patient is maximized on goal directed medical therapy, consider TEE for full assessment of mitral regurgitation for potential interventions. FINDINGS  Left Ventricle: Left ventricular ejection fraction, by estimation, is 25 to 30%. The left ventricle has severely decreased function. The left ventricle demonstrates global hypokinesis. Definity contrast agent was given IV to delineate the left ventricular endocardial borders. The left ventricular internal cavity size was moderately dilated. There is no left ventricular hypertrophy. Left ventricular diastolic function could not be evaluated due to mitral regurgitation  (moderate or greater). Left ventricular diastolic function could not be evaluated. Right Ventricle: The right ventricular size is normal. No increase in right ventricular  wall thickness. Right ventricular systolic function is moderately reduced. There is moderately elevated pulmonary artery systolic pressure. The tricuspid regurgitant velocity is 2.94 m/s, and with an assumed right atrial pressure of 15 mmHg, the estimated right ventricular systolic pressure is 37.8 mmHg. Left Atrium: Left atrial size was normal in size. Right Atrium: Right atrial size was normal in size. Pericardium: There is no evidence of pericardial effusion. Mitral Valve: Pulmonary vein systolic blunting of right sided veins. 2D VC 0.4. Difficult 2D PISA assessment. The mitral valve is normal in structure. Moderate to severe mitral valve regurgitation. No evidence of mitral valve stenosis. MV peak gradient, 1.4 mmHg. The mean mitral valve gradient is 3.0 mmHg with average heart rate of 80 bpm. Tricuspid Valve: The tricuspid valve is not well visualized. Tricuspid valve regurgitation is not demonstrated. No evidence of tricuspid stenosis. Aortic Valve: The aortic valve was not well visualized. There is mild calcification of the aortic valve. There is mild thickening of the aortic valve. Aortic valve regurgitation is not visualized. Aortic valve sclerosis is present, with no evidence of aortic valve stenosis. Pulmonic Valve: The pulmonic valve was not well visualized. Pulmonic valve regurgitation is not visualized. Aorta: The aortic root is normal in size and structure. IAS/Shunts: No atrial level shunt detected by color flow Doppler.  LEFT VENTRICLE PLAX 2D LVIDd:         5.90 cm      Diastology LVIDs:         5.00 cm      LV e' medial:    3.00 cm/s LV PW:         1.00 cm      LV E/e' medial:  48.3 LV IVS:        1.10 cm      LV e' lateral:   3.23 cm/s LVOT diam:     1.90 cm      LV E/e' lateral: 44.9 LV SV:         28 LV SV Index:   15 LVOT  Area:     2.84 cm  LV Volumes (MOD) LV vol d, MOD A2C: 218.0 ml LV vol d, MOD A4C: 208.0 ml LV vol s, MOD A2C: 166.0 ml LV vol s, MOD A4C: 140.0 ml LV SV MOD A2C:     52.0 ml LV SV MOD A4C:     208.0 ml LV SV MOD BP:      66.5 ml RIGHT VENTRICLE RV S prime:     7.35 cm/s TAPSE (M-mode): 1.2 cm LEFT ATRIUM             Index        RIGHT ATRIUM          Index LA diam:        3.60 cm 1.96 cm/m   RA Area:     9.73 cm LA Vol (A2C):   45.4 ml 24.67 ml/m  RA Volume:   19.60 ml 10.65 ml/m LA Vol (A4C):   40.6 ml 22.07 ml/m LA Biplane Vol: 46.3 ml 25.16 ml/m  AORTIC VALVE LVOT Vmax:   75.80 cm/s LVOT Vmean:  40.900 cm/s LVOT VTI:    0.099 m  AORTA Ao Root diam: 3.10 cm MITRAL VALVE                  TRICUSPID VALVE MV Area (PHT): 6.90 cm       TR Peak grad:   34.6 mmHg MV Area VTI:   2.36 cm  TR Vmax:        294.00 cm/s MV Peak grad:  1.4 mmHg MV Mean grad:  3.0 mmHg       SHUNTS MV Vmax:       0.60 m/s       Systemic VTI:  0.10 m MV Vmean:      31.6 cm/s      Systemic Diam: 1.90 cm MV Decel Time: 110 msec MR Peak grad:    92.9 mmHg MR Mean grad:    62.0 mmHg MR Vmax:         482.00 cm/s MR Vmean:        371.0 cm/s MR PISA:         2.26 cm MR PISA Eff ROA: 17 mm MR PISA Radius:  0.60 cm MV E velocity: 145.00 cm/s MV A velocity: 69.70 cm/s MV E/A ratio:  2.08 Rudean Haskell MD Electronically signed by Rudean Haskell MD Signature Date/Time: 09/11/2021/2:00:39 PM    Final    DG Chest Port 1 View  Result Date: 09/10/2021 CLINICAL DATA:  Chest pain EXAM: PORTABLE CHEST 1 VIEW COMPARISON:  06/24/2020 FINDINGS: Low lung volumes. Vascular crowding without frank interstitial edema. No pleural effusion or pneumothorax. The heart is top-normal in size for inspiration. IMPRESSION: Low lung volumes. No evidence of acute cardiopulmonary disease. Electronically Signed   By: Julian Hy M.D.   On: 09/10/2021 20:05    Cardiac Studies   Echo as above  Patient Profile     56 y.o. female pmh sx for  T2DM, hypertension, hyperlipidemia, chronic systolic/diastolic heart failure, CAD, CKD stage V (baseline creatinine at 3.2-3.6)  who is being seen for the evaluation of NSTEMI.   Assessment & Plan    Principal Problem:   NSTEMI (non-ST elevated myocardial infarction) ([place]) Active Problems:   Type 2 diabetes with nephropathy ([place])   Coronary artery disease involving native coronary artery of native heart without angina pectoris   Essential hypertension, benign   Class 2 obesity   Stented coronary artery   Hypertensive cardiomyopathy (HCC)   Hypothyroidism   GERD (gastroesophageal reflux disease)  -Multiple similar presentations for NSTEMI in the past 3-4 months -LHC showed Severe, multivessel disease.  Diffuse disease throughout the left system.  Occluded mid RCA stent.  Large right PDA and distal RCA system which fills by collaterals from the left. Deemed not to be a candidate for PCI or CABG -IV Heparin -C/w Aspirin and Plavix -Repeat Echo shows worsened LV function and likely worsened MR -Atorvastatin, Zetia and Vascepa -We will continue on IV heparin through the weekend and have her case reviewed by interventional cardiology Monday morning for potential intervention though it seems she has not been a candidate in the past. -If none, aggressive medical therapy and anti-anginal medications -C/w Imdur, will uptitrate today given favorable response to IV nitro. Continue iv nitro and wean as tolerated.   # Chronic HFrEF -GDMT limited by Kidney function -C/w home torsemide, does not seem to be in decompensated heart failure, I/o inaccurate -Echo worse, complex case. May want to involve nephrology for ongoing discussions.    # Type 2 diabetes mellitus with hyperglycemia Continue ISS and hypoglycemic protocol Continue Levemir 10 units nightly and adjust dose accordingly  # CKD stage V  Monitor BUN/creatinine  Renally adjust medications, avoid nephrotoxic  agents/dehydration/hypotension If renal function worsens still, will involve nephrology.    # Essential hypertension Continue Coreg, torsemide  # Mixed hyperlipidemia Continue Lipitor, Zetia   # Hypothyroidism Continue  Synthroid   # GERD Continue famotidine   For questions or updates, please contact [place] Please consult [email or web address] for contact info under        Signed, Elouise Munroe, MD  09/12/2021, 10:47 AM

## 2021-09-13 DIAGNOSIS — N184 Chronic kidney disease, stage 4 (severe): Secondary | ICD-10-CM

## 2021-09-13 DIAGNOSIS — I2510 Atherosclerotic heart disease of native coronary artery without angina pectoris: Secondary | ICD-10-CM

## 2021-09-13 LAB — CBC
HCT: 27.1 % — ABNORMAL LOW (ref 36.0–46.0)
Hemoglobin: 8.5 g/dL — ABNORMAL LOW (ref 12.0–15.0)
MCH: 30.6 pg (ref 26.0–34.0)
MCHC: 31.4 g/dL (ref 30.0–36.0)
MCV: 97.5 fL (ref 80.0–100.0)
Platelets: 240 10*3/uL (ref 150–400)
RBC: 2.78 MIL/uL — ABNORMAL LOW (ref 3.87–5.11)
RDW: 15.5 % (ref 11.5–15.5)
WBC: 11.1 10*3/uL — ABNORMAL HIGH (ref 4.0–10.5)
nRBC: 0.3 % — ABNORMAL HIGH (ref 0.0–0.2)

## 2021-09-13 LAB — GLUCOSE, CAPILLARY
Glucose-Capillary: 107 mg/dL — ABNORMAL HIGH (ref 70–99)
Glucose-Capillary: 140 mg/dL — ABNORMAL HIGH (ref 70–99)
Glucose-Capillary: 185 mg/dL — ABNORMAL HIGH (ref 70–99)
Glucose-Capillary: 158 mg/dL — ABNORMAL HIGH (ref 70–99)

## 2021-09-13 LAB — HEPARIN LEVEL (UNFRACTIONATED): Heparin Unfractionated: 0.13 IU/mL — ABNORMAL LOW (ref 0.30–0.70)

## 2021-09-13 MED ORDER — HEPARIN BOLUS VIA INFUSION
2000.0000 [IU] | Freq: Once | INTRAVENOUS | Status: AC
Start: 2021-09-13 — End: 2021-09-13
  Administered 2021-09-13: 2000 [IU] via INTRAVENOUS
  Filled 2021-09-13: qty 2000

## 2021-09-13 NOTE — TOC Progression Note (Signed)
Transition of Care [facility]) - Progression Note    Patient Details  Name: Monica Carlson MRN: [id number] Date of Birth: 1965-03-09  [facility] of [facility]) CM/SW Contact  Zenon Mayo, RN Phone Number: 09/13/2021, 4:29 PM  Clinical Narrative:    NSTEMI, severe multi vessels dz, not candidate for pci or CABG, conts on hep/nitro drip, trops elevated,  3 liters . TOC following.         Expected Discharge Plan and Services                                                 Social Determinants of Health (SDOH) Interventions    Readmission Risk Interventions    06/25/2021    2:27 PM 05/04/2021    2:36 PM  Readmission Risk Prevention Plan  Transportation Screening Complete Complete  PCP or Specialist Appt within 3-5 Days  Complete  HRI or [place]  Complete  Social Work Consult for [place] Planning/Counseling  Complete  Palliative Care Screening  Not Applicable  Medication Review [employer]) Complete Complete  HRI or [employer] Complete   SW Recovery Care/Counseling Consult Complete   Palliative Care Screening Not [place] Not Applicable

## 2021-09-13 NOTE — Progress Notes (Signed)
ANTICOAGULATION CONSULT NOTE   Pharmacy Consult for heparin Indication: chest pain/ACS  Allergies  Allergen Reactions   Cedar Swelling        Other Swelling    Teriyaki    Patient Measurements: Height: '5\' 1"'$  (154.9 cm) Weight: 88 kg (194 lb) (SCALE B) IBW/kg (Calculated) : 47.8 Heparin Dosing Weight: 67.4kg  Vital Signs: Temp: 98.6 F (37 C) (07/24 0900) Temp Source: Oral (07/24 0900) BP: 94/64 (07/24 0900) Pulse Rate: 74 (07/24 0900)  Labs: Recent Labs    09/10/21 2003 09/10/21 2134 09/11/21 0513 09/11/21 1121 09/12/21 0457 09/12/21 0654 09/12/21 1031 09/12/21 1527 09/13/21 0615  HGB 9.6*  --  9.7*  --  8.9*  --   --   --  8.5*  HCT 32.0*  --  31.1*  --  28.5*  --   --   --  27.1*  PLT 257  --  261  --  259  --   --   --  240  APTT >200*  --   --   --   --   --   --   --   --   LABPROT 16.3*  --  15.3*  --   --   --   --   --   --   INR 1.3*  --  1.2  --   --   --   --   --   --   HEPARINUNFRC  --   --  0.39   < > <0.10* <0.10*  --  <0.10* 0.13*  CREATININE 3.61*  --  3.59*  --   --   --  3.82*  --   --   TROPONINIHS 260* 369*  --   --   --   --  22,550*  --   --    < > = values in this interval not displayed.     Estimated Creatinine Clearance: 16.6 mL/min (A) (by C-G formula based on SCr of 3.82 mg/dL (H)).   Medical History: Past Medical History:  Diagnosis Date   Cervical cancer ([place])    cryotherapy and colposcopy/[place]; 1991   CHF (congestive heart failure) ([place]) 07-2014   Diabetes mellitus without complication ([place]) 4403   High cholesterol    Hypertension    MI (myocardial infarction) ([place])    2004   Stroke ([place]) 09/2014, 2013   Vaginal Pap smear, abnormal    Assessment: 2 YOF presenting with CP, significant hx of CAD. Heparin 4000 units IV x1 given as STEMI protocol. Pharmacy has been consulted to continue heparin gtt as medical management.   Heparin level 0.13. Per RN, no problems with infusion or bleeding at the IV site. Hgb  trending down to 8.5 and platelets stable  Goal of Therapy:  Heparin level 0.3-0.7 units/ml Monitor platelets by anticoagulation protocol: Yes   Plan:  Give heparin 2000 units x1 Increase heparin gtt to 1450 units/hr F/u heparin level in 8 hrs Monitor daily heparin level, CBC, s/sx bleeding  Sandford Craze, PharmD. [facility] Acute Care PGY-1  09/13/2021 9:34 AM

## 2021-09-13 NOTE — Progress Notes (Signed)
Mobility Specialist - Progress Note   09/13/21 1500  Mobility  Activity Ambulated with assistance in hallway  Level of Assistance Contact guard assist, steadying assist  Assistive Device Cane  Distance Ambulated (ft) 250 ft  Activity Response Tolerated fair  $Mobility charge 1 Mobility   Post-mobility: 71 HR, 118/74 BP, 98% SPO2 Pt received EOB agreeable to mobility. C/o feeling tired halfway through ambulation needing to sit down and be wheeled back to room. Pt left EOB with call bell in reach and VSS.

## 2021-09-13 NOTE — Progress Notes (Signed)
Progress Note  Patient Name: Monica Carlson Date of Encounter: 09/13/2021  [facility] Cardiologist: Carlyle Dolly, MD   Subjective   No chest pain. Sitting up in chair resting.  Inpatient Medications    Scheduled Meds:  aspirin EC  81 mg Oral Daily   atorvastatin  80 mg Oral Daily   carvedilol  6.25 mg Oral BID WC   clopidogrel  75 mg Oral Daily   ezetimibe  10 mg Oral Daily   gabapentin  300 mg Oral BID   icosapent Ethyl  2 g Oral BID WC   insulin aspart  0-9 Units Subcutaneous TID WC   insulin detemir  15 Units Subcutaneous QHS   isosorbide mononitrate  90 mg Oral Daily   levothyroxine  112 mcg Oral q morning   sodium bicarbonate  650 mg Oral BID   torsemide  40 mg Oral Daily   Continuous Infusions:  sodium chloride Stopped (09/11/21 0409)   heparin 1,450 Units/hr (09/13/21 1006)   nitroGLYCERIN 10 mcg/min (09/11/21 1317)   PRN Meds: acetaminophen, albuterol, nitroGLYCERIN, prochlorperazine, traZODone   Vital Signs    Vitals:   09/13/21 0336 09/13/21 0900 09/13/21 1000 09/13/21 1011  BP: 93/69 94/64  102/69  Pulse: 67 74 60 64  Resp: 20     Temp: 98.8 F (37.1 C) 98.6 F (37 C)    TempSrc: Oral Oral    SpO2: 92% (!) 87% 100% 100%  Weight:      Height:        Intake/Output Summary (Last 24 hours) at 09/13/2021 1038 Last data filed at 09/13/2021 0925 Gross per 24 hour  Intake 993.81 ml  Output 600 ml  Net 393.81 ml      09/12/2021    4:16 AM 09/11/2021    4:54 PM 09/10/2021    7:38 PM  Last 3 Weights  Weight (lbs) 194 lb 194 lb 188 lb 0.8 oz  Weight (kg) 87.998 kg 87.998 kg 85.3 kg      Telemetry    Sinus Rhythm, NVST  - Personally Reviewed  ECG    No new tracing.  Physical Exam   GEN: Sitting in chair. Appears older then stated age. Neck: No JVD Cardiac: RRR, + systolic murmur, no rubs, or gallops.  Respiratory: Clear to auscultation bilaterally. GI: Soft, nontender, non-distended  MS: No edema; No deformity. Neuro:  Nonfocal   Psych: Normal affect   Labs    High Sensitivity Troponin:   Recent Labs  Lab 09/10/21 2003 09/10/21 2134 09/12/21 1031  TROPONINIHS 260* 369* 22,550*     Chemistry Recent Labs  Lab 09/10/21 2003 09/11/21 0513 09/12/21 1031  NA 137 133* 137  K 4.0 3.7 4.0  CL 114* 111 112*  CO2 13* 16* 18*  GLUCOSE 256* 145* 153*  BUN 33* 33* 36*  CREATININE 3.61* 3.59* 3.82*  CALCIUM 8.1* 8.3* 8.2*  MG  --   --  2.0  PROT 6.2*  --   --   ALBUMIN 3.4*  --   --   AST 16  --   --   ALT 17  --   --   ALKPHOS 75  --   --   BILITOT 0.5  --   --   GFRNONAA 14* 14* 13*  ANIONGAP '10 6 7    '$ Lipids  Recent Labs  Lab 09/10/21 2003  CHOL 158  TRIG 77  HDL 29*  LDLCALC 114*  CHOLHDL 5.4    Hematology Recent Labs  Lab 09/11/21 0513 09/12/21 0457 09/13/21 0615  WBC 11.0* 11.7* 11.1*  RBC 3.18* 2.89* 2.78*  HGB 9.7* 8.9* 8.5*  HCT 31.1* 28.5* 27.1*  MCV 97.8 98.6 97.5  MCH 30.5 30.8 30.6  MCHC 31.2 31.2 31.4  RDW 15.8* 15.7* 15.5  PLT 261 259 240   Thyroid No results for input(s): "TSH", "FREET4" in the last 168 hours.  BNPNo results for input(s): "BNP", "PROBNP" in the last 168 hours.  DDimer No results for input(s): "DDIMER" in the last 168 hours.   Radiology    ECHOCARDIOGRAM COMPLETE  Result Date: 09/11/2021    ECHOCARDIOGRAM REPORT   Patient Name:   Monica Carlson Date of Exam: 09/11/2021 Medical Rec #:  [id number]    Height:       61.0 in Accession #:    [id number]   Weight:       188.1 lb Date of Birth:  09/13/1965   BSA:          1.840 m Patient Age:    74 years     BP:           141/98 mmHg Patient Gender: F            HR:           79 bpm. Exam Location:  Inpatient Procedure: 2D Echo, Cardiac Doppler, Color Doppler and Intracardiac            Opacification Agent Indications:    Acute ischemic heart disease, unspecified I24.9  History:        Patient has prior history of Echocardiogram examinations, most                 recent 06/25/2021. CHF, CAD, NSTEMI and Previous  Myocardial                 Infarction, Stroke; Risk Factors:Hypertension, Diabetes and                 Dyslipidemia. Chronic kidney disease.  Sonographer:    Darlina Sicilian RDCS Referring Phys: [id number] [place]  1. Left ventricular ejection fraction, by estimation, is 25 to 30%. The left ventricle has severely decreased function. The left ventricle demonstrates global hypokinesis. The left ventricular internal cavity size was moderately dilated. Left ventricular diastolic function could not be evaluated.  2. Right ventricular systolic function is moderately reduced. The right ventricular size is normal. There is moderately elevated pulmonary artery systolic pressure. The estimated right ventricular systolic pressure is 93.7 mmHg.  3. Pulmonary vein systolic blunting of right sided veins. 2D VC 0.4. Difficult 2D PISA assessment. The mitral valve is normal in structure. Moderate to severe mitral valve regurgitation. Ventricular function etiology No evidence of mitral stenosis.  4. The aortic valve was not well visualized. There is mild calcification of the aortic valve. There is mild thickening of the aortic valve. Aortic valve regurgitation is not visualized. Aortic valve sclerosis is present, with no evidence of aortic valve  stenosis. Comparison(s): Worsened LV size, function and mitral regurgitation. Conclusion(s)/Recommendation(s): If patient is maximized on goal directed medical therapy, consider TEE for full assessment of mitral regurgitation for potential interventions. FINDINGS  Left Ventricle: Left ventricular ejection fraction, by estimation, is 25 to 30%. The left ventricle has severely decreased function. The left ventricle demonstrates global hypokinesis. Definity contrast agent was given IV to delineate the left ventricular endocardial borders. The left ventricular internal cavity size was moderately dilated. There is no left ventricular hypertrophy. Left  ventricular diastolic  function could not be evaluated due to mitral regurgitation (moderate or greater). Left ventricular diastolic function could not be evaluated. Right Ventricle: The right ventricular size is normal. No increase in right ventricular wall thickness. Right ventricular systolic function is moderately reduced. There is moderately elevated pulmonary artery systolic pressure. The tricuspid regurgitant velocity is 2.94 m/s, and with an assumed right atrial pressure of 15 mmHg, the estimated right ventricular systolic pressure is 99.2 mmHg. Left Atrium: Left atrial size was normal in size. Right Atrium: Right atrial size was normal in size. Pericardium: There is no evidence of pericardial effusion. Mitral Valve: Pulmonary vein systolic blunting of right sided veins. 2D VC 0.4. Difficult 2D PISA assessment. The mitral valve is normal in structure. Moderate to severe mitral valve regurgitation. No evidence of mitral valve stenosis. MV peak gradient, 1.4 mmHg. The mean mitral valve gradient is 3.0 mmHg with average heart rate of 80 bpm. Tricuspid Valve: The tricuspid valve is not well visualized. Tricuspid valve regurgitation is not demonstrated. No evidence of tricuspid stenosis. Aortic Valve: The aortic valve was not well visualized. There is mild calcification of the aortic valve. There is mild thickening of the aortic valve. Aortic valve regurgitation is not visualized. Aortic valve sclerosis is present, with no evidence of aortic valve stenosis. Pulmonic Valve: The pulmonic valve was not well visualized. Pulmonic valve regurgitation is not visualized. Aorta: The aortic root is normal in size and structure. IAS/Shunts: No atrial level shunt detected by color flow Doppler.  LEFT VENTRICLE PLAX 2D LVIDd:         5.90 cm      Diastology LVIDs:         5.00 cm      LV e' medial:    3.00 cm/s LV PW:         1.00 cm      LV E/e' medial:  48.3 LV IVS:        1.10 cm      LV e' lateral:   3.23 cm/s LVOT diam:     1.90 cm      LV  E/e' lateral: 44.9 LV SV:         28 LV SV Index:   15 LVOT Area:     2.84 cm  LV Volumes (MOD) LV vol d, MOD A2C: 218.0 ml LV vol d, MOD A4C: 208.0 ml LV vol s, MOD A2C: 166.0 ml LV vol s, MOD A4C: 140.0 ml LV SV MOD A2C:     52.0 ml LV SV MOD A4C:     208.0 ml LV SV MOD BP:      66.5 ml RIGHT VENTRICLE RV S prime:     7.35 cm/s TAPSE (M-mode): 1.2 cm LEFT ATRIUM             Index        RIGHT ATRIUM          Index LA diam:        3.60 cm 1.96 cm/m   RA Area:     9.73 cm LA Vol (A2C):   45.4 ml 24.67 ml/m  RA Volume:   19.60 ml 10.65 ml/m LA Vol (A4C):   40.6 ml 22.07 ml/m LA Biplane Vol: 46.3 ml 25.16 ml/m  AORTIC VALVE LVOT Vmax:   75.80 cm/s LVOT Vmean:  40.900 cm/s LVOT VTI:    0.099 m  AORTA Ao Root diam: 3.10 cm MITRAL VALVE  TRICUSPID VALVE MV Area (PHT): 6.90 cm       TR Peak grad:   34.6 mmHg MV Area VTI:   2.36 cm       TR Vmax:        294.00 cm/s MV Peak grad:  1.4 mmHg MV Mean grad:  3.0 mmHg       SHUNTS MV Vmax:       0.60 m/s       Systemic VTI:  0.10 m MV Vmean:      31.6 cm/s      Systemic Diam: 1.90 cm MV Decel Time: 110 msec MR Peak grad:    92.9 mmHg MR Mean grad:    62.0 mmHg MR Vmax:         482.00 cm/s MR Vmean:        371.0 cm/s MR PISA:         2.26 cm MR PISA Eff ROA: 17 mm MR PISA Radius:  0.60 cm MV E velocity: 145.00 cm/s MV A velocity: 69.70 cm/s MV E/A ratio:  2.08 Rudean Haskell MD Electronically signed by Rudean Haskell MD Signature Date/Time: 09/11/2021/2:00:39 PM    Final     Cardiac Studies   Echo: 09/11/21  IMPRESSIONS     1. Left ventricular ejection fraction, by estimation, is 25 to 30%. The  left ventricle has severely decreased function. The left ventricle  demonstrates global hypokinesis. The left ventricular internal cavity size  was moderately dilated. Left  ventricular diastolic function could not be evaluated.   2. Right ventricular systolic function is moderately reduced. The right  ventricular size is normal. There  is moderately elevated pulmonary artery  systolic pressure. The estimated right ventricular systolic pressure is  16.1 mmHg.   3. Pulmonary vein systolic blunting of right sided veins. 2D VC 0.4.  Difficult 2D PISA assessment. The mitral valve is normal in structure.  Moderate to severe mitral valve regurgitation. Ventricular function  etiology No evidence of mitral stenosis.   4. The aortic valve was not well visualized. There is mild calcification  of the aortic valve. There is mild thickening of the aortic valve. Aortic  valve regurgitation is not visualized. Aortic valve sclerosis is present,  with no evidence of aortic valve   stenosis.   Comparison(s): Worsened LV size, function and mitral regurgitation.   Conclusion(s)/Recommendation(s): If patient is maximized on goal directed  medical therapy, consider TEE for full assessment of mitral regurgitation  for potential interventions.   FINDINGS   Left Ventricle: Left ventricular ejection fraction, by estimation, is 25  to 30%. The left ventricle has severely decreased function. The left  ventricle demonstrates global hypokinesis. Definity contrast agent was  given IV to delineate the left  ventricular endocardial borders. The left ventricular internal cavity size  was moderately dilated. There is no left ventricular hypertrophy. Left  ventricular diastolic function could not be evaluated due to mitral  regurgitation (moderate or greater).  Left ventricular diastolic function could not be evaluated.   Right Ventricle: The right ventricular size is normal. No increase in  right ventricular wall thickness. Right ventricular systolic function is  moderately reduced. There is moderately elevated pulmonary artery systolic  pressure. The tricuspid regurgitant  velocity is 2.94 m/s, and with an assumed right atrial pressure of 15  mmHg, the estimated right ventricular systolic pressure is 09.6 mmHg.   Left Atrium: Left atrial size  was normal in size.   Right Atrium: Right atrial size was normal in  size.   Pericardium: There is no evidence of pericardial effusion.   Mitral Valve: Pulmonary vein systolic blunting of right sided veins. 2D VC  0.4. Difficult 2D PISA assessment. The mitral valve is normal in  structure. Moderate to severe mitral valve regurgitation. No evidence of  mitral valve stenosis. MV peak gradient,  1.4 mmHg. The mean mitral valve gradient is 3.0 mmHg with average heart  rate of 80 bpm.   Tricuspid Valve: The tricuspid valve is not well visualized. Tricuspid  valve regurgitation is not demonstrated. No evidence of tricuspid  stenosis.   Aortic Valve: The aortic valve was not well visualized. There is mild  calcification of the aortic valve. There is mild thickening of the aortic  valve. Aortic valve regurgitation is not visualized. Aortic valve  sclerosis is present, with no evidence of  aortic valve stenosis.   Pulmonic Valve: The pulmonic valve was not well visualized. Pulmonic valve  regurgitation is not visualized.   Aorta: The aortic root is normal in size and structure.   IAS/Shunts: No atrial level shunt detected by color flow Doppler.   Patient Profile     56 y.o. female pmh sx for T2DM, hypertension, hyperlipidemia, chronic systolic/diastolic heart failure, CAD, CKD stage V (baseline creatinine at 3.2-3.6)  who is being seen for the evaluation of NSTEMI.   Assessment & Plan    NSTEMI (multiple admissions for the same) Known diffuse CAD -- presented with recurrent chest pain [place] at [address]. Treated with IV heparin for 48 hrs. Chest pain has resolved. She has known diffuse CAD with no options for PCI targets or CABG. Only option for medical therapy. Will wean, DC IV nitro. Stop IV heparin. --Continue aspirin, atorvastatin 80 mg daily, carvedilol 6.25 mg twice daily, Plavix, Zetia, Vascepa and Imdur 90 mg daily (60 mg daily PTA)  CKD stage V: Baseline creatinine  around 3.4-3.8  -- Remains around baseline  HFrEF ICM --Echocardiogram this admission shows further decline in LVEF to 25 to 30% with global hypokinesis, moderately reduced RV, normal RV size, moderate to severe MR, no significant aortic valvular disease. -- does not appear significant volume overloaded -- continue coreg 6.'25mg'$  BID, unable to add additional GDMT in the setting of advanced renal disease -- on torsemide '40mg'$  daily   Moderate to Severe MR: noted on echo this admission. MV peak gradient,  1.4 mmHg. The mean mitral valve gradient is 3.0 mmHg with average heart  rate of 80 bpm. I would not expect that we would pursue further work up given her extensive CAD and renal disease. Anticipate medical management  HLD: on atorvastatin '80mg'$  daily, Zetia and Vascepa  Hypothyroidism: on synthroid  For questions or updates, please contact [facility] Please consult [email or web address] for contact info under        Signed, Reino Bellis, NP  09/13/2021, 10:38 AM

## 2021-09-13 NOTE — Progress Notes (Addendum)
Patient found on the floor next to the recliner by the staff. Patient states she was trying to reach her food when she slid out of the recliner chair in her room. Patient alert and oriented, and states she did not hit her head, and states she has no pain anywhere. Patient helped to the bed by staff without any difficulty. Patient able to move all her extremities. Vital signs stable. Daughter Ferrin Liebig notified via leaving a voice message. Marcille Blanco, RN  1250: NP Reino Bellis notified patient found on the floor. No orders. Marcille Blanco, RN

## 2021-09-13 NOTE — Plan of Care (Signed)
  Problem: Education: Goal: Understanding of cardiac disease, CV risk reduction, and recovery process will improve Outcome: Progressing   Problem: Education: Goal: Individualized Educational Video(s) Outcome: Progressing   Problem: Activity: Goal: Ability to tolerate increased activity will improve Outcome: Progressing   Problem: Activity: Goal: Ability to tolerate increased activity will improve Outcome: Progressing   Problem: Cardiac: Goal: Ability to achieve and maintain adequate cardiovascular perfusion will improve Outcome: Progressing   Problem: Health Behavior/Discharge Planning: Goal: Ability to safely manage health-related needs after discharge will improve Outcome: Progressing

## 2021-09-14 ENCOUNTER — Inpatient Hospital Stay (HOSPITAL_COMMUNITY): Payer: Medicare Other

## 2021-09-14 DIAGNOSIS — D509 Iron deficiency anemia, unspecified: Secondary | ICD-10-CM

## 2021-09-14 DIAGNOSIS — I10 Essential (primary) hypertension: Secondary | ICD-10-CM

## 2021-09-14 LAB — GLUCOSE, CAPILLARY
Glucose-Capillary: 114 mg/dL — ABNORMAL HIGH (ref 70–99)
Glucose-Capillary: 147 mg/dL — ABNORMAL HIGH (ref 70–99)
Glucose-Capillary: 149 mg/dL — ABNORMAL HIGH (ref 70–99)

## 2021-09-14 LAB — CBC
HCT: 25.5 % — ABNORMAL LOW (ref 36.0–46.0)
Hemoglobin: 8.1 g/dL — ABNORMAL LOW (ref 12.0–15.0)
MCH: 31 pg (ref 26.0–34.0)
MCHC: 31.8 g/dL (ref 30.0–36.0)
MCV: 97.7 fL (ref 80.0–100.0)
Platelets: 218 10*3/uL (ref 150–400)
RBC: 2.61 MIL/uL — ABNORMAL LOW (ref 3.87–5.11)
RDW: 15.5 % (ref 11.5–15.5)
WBC: 10.6 10*3/uL — ABNORMAL HIGH (ref 4.0–10.5)
nRBC: 0.5 % — ABNORMAL HIGH (ref 0.0–0.2)

## 2021-09-14 LAB — FOLATE: Folate: 9.6 ng/mL (ref >5.9)

## 2021-09-14 LAB — RETICULOCYTES
Immature Retic Fract: 27.4 % — ABNORMAL HIGH (ref 2.3–15.9)
RBC.: 2.61 MIL/uL — ABNORMAL LOW (ref 3.87–5.11)
Retic Count, Absolute: 59.5 10*3/uL (ref 19.0–186.0)
Retic Ct Pct: 2.3 % (ref 0.4–3.1)

## 2021-09-14 LAB — IRON AND TIBC
Iron: 18 ug/dL — ABNORMAL LOW (ref 28–170)
Saturation Ratios: 6 % — ABNORMAL LOW (ref 10.4–31.8)
TIBC: 283 ug/dL (ref 250–450)
UIBC: 265 ug/dL

## 2021-09-14 LAB — FERRITIN: Ferritin: 203 ng/mL (ref 11–307)

## 2021-09-14 LAB — VITAMIN B12: Vitamin B-12: 121 pg/mL — ABNORMAL LOW (ref 180–914)

## 2021-09-14 MED ORDER — FERROUS SULFATE 325 (65 FE) MG PO TABS: 325.0000 mg | ORAL_TABLET | Freq: Every day | ORAL | Status: DC

## 2021-09-14 MED ORDER — FERROUS SULFATE 325 (65 FE) MG PO TABS: 325.0000 mg | ORAL_TABLET | Freq: Every day | ORAL | 1 refills | Status: AC

## 2021-09-14 MED ORDER — ISOSORBIDE MONONITRATE ER 30 MG PO TB24: 90.0000 mg | ORAL_TABLET | Freq: Every day | ORAL | 0 refills | Status: AC

## 2021-09-14 NOTE — TOC Initial Note (Signed)
Transition of Care [facility]) - Initial/Assessment Note    Patient Details  Name: Monica Carlson MRN: [id number] Date of Birth: 1966/01/26  [facility]) CM/SW Contact:    Zenon Mayo, RN Phone Number: 09/14/2021, 3:02 PM  Clinical Narrative:                 Patient is from home alone, she has CAPS services M-F from 9am to 3 pm , she uses [facility]. She states her boyfriend will be transporting her home today.  She has inhaler at home.    Expected Discharge Plan: Home/Self Care Barriers to Discharge: No Barriers Identified   Patient Goals and CMS Choice Patient states their goals for this hospitalization and ongoing recovery are:: return home   Choice offered to / list presented to : NA  Expected Discharge Plan and Services Expected Discharge Plan: Home/Self Care In-house Referral: NA Discharge Planning Services: CM Consult Post Acute Care Choice: NA Living arrangements for the past 2 months: Apartment Expected Discharge Date: 09/14/21                 DME Agency: NA       HH Arranged: NA          Prior Living Arrangements/Services Living arrangements for the past 2 months: Apartment Lives with:: Self Patient language and need for interpreter reviewed:: Yes Do you feel safe going back to the place where you live?: Yes      Need for Family Participation in Patient Care: Yes (Comment) Care giver support system in place?: Yes (comment) Current home services: DME (walker , cane,) Criminal Activity/Legal Involvement Pertinent to Current Situation/Hospitalization: No - Comment as needed  Activities of Daily Living Home Assistive Devices/Equipment: Cane (specify quad or straight) (straight) ADL Screening (condition at time of admission) Patient's cognitive ability adequate to safely complete daily activities?: Yes Is the patient deaf or have difficulty hearing?: No Does the patient have difficulty seeing, even when wearing glasses/contacts?: No Does  the patient have difficulty concentrating, remembering, or making decisions?: No Patient able to express need for assistance with ADLs?: Yes Does the patient have difficulty dressing or bathing?: No Independently performs ADLs?: Yes (appropriate for developmental age) Does the patient have difficulty walking or climbing stairs?: No Weakness of Legs: Both Weakness of Arms/Hands: Both  Permission Sought/Granted                  Emotional Assessment Appearance:: Appears stated age Attitude/Demeanor/Rapport: Engaged Affect (typically observed): Appropriate Orientation: : Oriented to Self, Oriented to Place, Oriented to  Time, Oriented to Situation Alcohol / Substance Use: Not Applicable Psych Involvement: No (comment)  Admission diagnosis:  NSTEMI (non-ST elevated myocardial infarction) [facility]) [I21.4] Patient Active Problem List   Diagnosis Date Noted   Benign hypertensive heart and kidney disease and CKD stage V ([place]) 06/25/2021   Mixed hyperlipidemia 06/25/2021   GERD (gastroesophageal reflux disease) 06/25/2021   CKD (chronic kidney disease), stage IV ([place]) 04/22/2021   Coronary artery disease 04/21/2021   Prolonged QT interval 04/21/2021   AKI (acute kidney injury) ([place])    Acute myocardial infarction ([place])    NSTEMI (non-ST elevated myocardial infarction) ([place]) 03/19/2021   IDA (iron deficiency anemia) 06/25/2019   Vitamin D deficiency 10/17/2018   Diabetes mellitus without complication ([place]) 37/85/8850   Encounter for screening colonoscopy 03/21/2017   Hypertension    High cholesterol    Acute ischemic stroke ([place]) 10/11/2016   Severe left ventricular systolic dysfunction  History of right MCA stroke 10/10/2016   Dysphagia    Acute systolic congestive heart failure (HCC)    Acute on chronic combined systolic and diastolic CHF (congestive heart failure) ([place]) 10/08/2016   Acute on chronic diastolic CHF (congestive heart failure) ([place]) 10/07/2016   Uncontrolled type  2 diabetes mellitus with hyperglycemia ([place]) 10/07/2016   Essential hypertension 10/07/2016   Personal history of noncompliance with medical treatment, presenting hazards to health 12/26/2015   Hypothyroidism 05/30/2015   Type 2 diabetes with nephropathy ([place]) 05/04/2015   Coronary artery disease involving native coronary artery of native heart without angina pectoris 05/04/2015   Essential hypertension, benign 05/04/2015   Class 2 obesity 05/04/2015   Stented coronary artery 05/04/2015   History of MI (myocardial infarction) 05/04/2015   Hypertensive cardiomyopathy ([place]) 05/04/2015   PCP:  Leonie Douglas, MD Pharmacy:   [place], [place] [address] [place] [address] Phone: [phone] Fax: [phone]     Social Determinants of Health (SDOH) Interventions    Readmission Risk Interventions    09/14/2021    2:58 PM 06/25/2021    2:27 PM 05/04/2021    2:36 PM  Readmission Risk Prevention Plan  Transportation Screening Complete Complete Complete  PCP or Specialist Appt within 3-5 Days   Complete  HRI or [place]   Complete  Social Work Consult for [place] Planning/Counseling   Complete  Palliative Care Screening   Not Applicable  Medication Review Press photographer) Complete Complete Complete  PCP or Specialist appointment within 3-5 days of discharge Complete    HRI or [place] Complete Complete   SW Recovery Care/Counseling Consult Complete Complete   Palliative Care Screening Not Applicable Not [place] Not Applicable Not Applicable

## 2021-09-14 NOTE — Plan of Care (Signed)
  Problem: Education: Goal: Understanding of cardiac disease, CV risk reduction, and recovery process will improve Outcome: Progressing   Problem: Education: Goal: Individualized Educational Video(s) Outcome: Progressing   Problem: Activity: Goal: Ability to tolerate increased activity will improve Outcome: Progressing   Problem: Cardiac: Goal: Ability to achieve and maintain adequate cardiovascular perfusion will improve Outcome: Progressing

## 2021-09-14 NOTE — TOC Transition Note (Signed)
Transition of Care [facility]) - CM/SW Discharge Note   Patient Details  Name: Monica Carlson MRN: [id number] Date of Birth: 11/11/1965  [facility] CM/SW Contact:  Zenon Mayo, RN Phone Number: 09/14/2021, 3:05 PM   Clinical Narrative:    Patient is for dc today, she has no needs.  Boyfriend will transport her home today.    Final next level of care: Home/Self Care Barriers to Discharge: No Barriers Identified   Patient Goals and CMS Choice Patient states their goals for this hospitalization and ongoing recovery are:: return home   Choice offered to / list presented to : NA  Discharge Placement                       Discharge Plan and Services In-house Referral: NA Discharge Planning Services: CM Consult Post Acute Care Choice: NA            DME Agency: NA       HH Arranged: NA          Social Determinants of Health (SDOH) Interventions     Readmission Risk Interventions    09/14/2021    2:58 PM 06/25/2021    2:27 PM 05/04/2021    2:36 PM  Readmission Risk Prevention Plan  Transportation Screening Complete Complete Complete  PCP or Specialist Appt within 3-5 Days   Complete  HRI or [place]   Complete  Social Work Consult for [place] Planning/Counseling   Complete  Palliative Care Screening   Not Applicable  Medication Review Press photographer) Complete Complete Complete  PCP or Specialist appointment within 3-5 days of discharge Complete    HRI or [place] Complete Complete   SW Recovery Care/Counseling Consult Complete Complete   Palliative Care Screening Not Applicable Not [place] Not Applicable Not Applicable

## 2021-09-14 NOTE — Discharge Summary (Signed)
Discharge Summary    Patient ID: Monica Carlson MRN: [id number]; DOB: [birth year]  Admit date: 09/10/2021 Discharge date: 09/14/2021  PCP:  Leonie Douglas, MD   [facility] Providers Cardiologist:  Carlyle Dolly, MD     Discharge Diagnoses    Principal Problem:   NSTEMI (non-ST elevated myocardial infarction) [facility]) Active Problems:   Type 2 diabetes with nephropathy ([place])   Coronary artery disease involving native coronary artery of native heart without angina pectoris   Essential hypertension, benign   Class 2 obesity   Stented coronary artery   Hypertensive cardiomyopathy ([place])   Hypothyroidism   GERD (gastroesophageal reflux disease)    Diagnostic Studies/Procedures    Echo: 09/11/21   IMPRESSIONS     1. Left ventricular ejection fraction, by estimation, is 25 to 30%. The  left ventricle has severely decreased function. The left ventricle  demonstrates global hypokinesis. The left ventricular internal cavity size  was moderately dilated. Left  ventricular diastolic function could not be evaluated.   2. Right ventricular systolic function is moderately reduced. The right  ventricular size is normal. There is moderately elevated pulmonary artery  systolic pressure. The estimated right ventricular systolic pressure is  93.8 mmHg.   3. Pulmonary vein systolic blunting of right sided veins. 2D VC 0.4.  Difficult 2D PISA assessment. The mitral valve is normal in structure.  Moderate to severe mitral valve regurgitation. Ventricular function  etiology No evidence of mitral stenosis.   4. The aortic valve was not well visualized. There is mild calcification  of the aortic valve. There is mild thickening of the aortic valve. Aortic  valve regurgitation is not visualized. Aortic valve sclerosis is present,  with no evidence of aortic valve   stenosis.   Comparison(s): Worsened LV size, function and mitral regurgitation.   Conclusion(s)/Recommendation(s): If  patient is maximized on goal directed  medical therapy, consider TEE for full assessment of mitral regurgitation  for potential interventions.   FINDINGS   Left Ventricle: Left ventricular ejection fraction, by estimation, is 25  to 30%. The left ventricle has severely decreased function. The left  ventricle demonstrates global hypokinesis. Definity contrast agent was  given IV to delineate the left  ventricular endocardial borders. The left ventricular internal cavity size  was moderately dilated. There is no left ventricular hypertrophy. Left  ventricular diastolic function could not be evaluated due to mitral  regurgitation (moderate or greater).  Left ventricular diastolic function could not be evaluated.   Right Ventricle: The right ventricular size is normal. No increase in  right ventricular wall thickness. Right ventricular systolic function is  moderately reduced. There is moderately elevated pulmonary artery systolic  pressure. The tricuspid regurgitant  velocity is 2.94 m/s, and with an assumed right atrial pressure of 15  mmHg, the estimated right ventricular systolic pressure is 18.2 mmHg.   Left Atrium: Left atrial size was normal in size.   Right Atrium: Right atrial size was normal in size.   Pericardium: There is no evidence of pericardial effusion.   Mitral Valve: Pulmonary vein systolic blunting of right sided veins. 2D VC  0.4. Difficult 2D PISA assessment. The mitral valve is normal in  structure. Moderate to severe mitral valve regurgitation. No evidence of  mitral valve stenosis. MV peak gradient,  1.4 mmHg. The mean mitral valve gradient is 3.0 mmHg with average heart  rate of 80 bpm.   Tricuspid Valve: The tricuspid valve is not well visualized. Tricuspid  valve regurgitation is not demonstrated.  No evidence of tricuspid  stenosis.   Aortic Valve: The aortic valve was not well visualized. There is mild  calcification of the aortic valve. There is mild  thickening of the aortic  valve. Aortic valve regurgitation is not visualized. Aortic valve  sclerosis is present, with no evidence of  aortic valve stenosis.   Pulmonic Valve: The pulmonic valve was not well visualized. Pulmonic valve  regurgitation is not visualized.   Aorta: The aortic root is normal in size and structure.   IAS/Shunts: No atrial level shunt detected by color flow Doppler.  _____________   History of Present Illness     Monica Carlson is a 56 y.o. female with pmh sx for T2DM, hypertension, hyperlipidemia, chronic systolic/diastolic heart failure, CAD, CKD stage V (baseline creatinine at 3.2-3.6)  who is being seen 09/10/2021 for the evaluation of NSTEMI. She has had multiple admissions for NSTEMI in the past 3-4 months. She underwent heart cath and was deemed not to be a candidate for CABG or PCI.  Had recommended for medical management at that time in May 2023. On admission she started again having chest pain suddenly. States she was watching tv and started having mid sternal chest pain 10/10. Took 324 mg ASA and 1 SL nitro. EMS gave 16m zofran, 1 SL nitro. Initially Code STEMI was activated however was cancelled by Dr KClaiborne Billingsas no definitive STEMI and CP had improved. Her first troponin was 269 in the ED. Cr was in 3s. Cardiology was called for admission. Heparin was started.   Hospital Course     NSTEMI (multiple admissions for the same) Known diffuse CAD -- presented with recurrent chest pain [place] [address]. Treated with IV heparin for 48 hrs. Chest pain has resolved. She has known diffuse CAD with no options for PCI targets or CABG. Only option for medical therapy. Was weaned from IV nitro. --Continue aspirin, atorvastatin 80 mg daily, carvedilol 6.25 mg twice daily, Plavix, Zetia, Vascepa and Imdur 90 mg daily (60 mg daily PTA)   CKD stage V: Baseline creatinine around 3.4-3.8  -- Remains around baseline   HFrEF ICM --Echocardiogram this admission shows  further decline in LVEF to 25 to 30% with global hypokinesis, moderately reduced RV, normal RV size, moderate to severe MR, no significant aortic valvular disease. -- does not appear significant volume overloaded -- continue coreg 6.243mBID, unable to add additional GDMT in the setting of advanced renal disease -- on torsemide 4019maily    Moderate to Severe MR: noted on echo this admission. MV peak gradient,  1.4 mmHg. The mean mitral valve gradient is 3.0 mmHg with average heart  rate of 80 bpm. Would not expect that we would pursue further work up given her extensive CAD and renal disease. Continue medical management  HLD: on atorvastatin 54m64mily, Zetia and Vascepa   Hypothyroidism: on synthroid  Chronic anemia: Hgb usually in the 9-10 range. Iron studies--> Iron 18.  -- started on oral Iron at discharge  -- recheck CBC at follow up visit   Deconditioned: she is quite frail, was able to work with mobility specialist while admitted without recurrent chest pain. Already has a HH aBlainee who visits frequently during the week for ADLs.   Patient was seen by Dr. JordMartinique deemed stable for discharge home. Follow up arranged in the office. Medications sent to pharmacy of choice.   Did the patient have an acute coronary syndrome (MI, NSTEMI, STEMI, etc) this admission?:  Yes                               AHA/ACC Clinical Performance & Quality Measures: Aspirin prescribed? - Yes ADP Receptor Inhibitor (Plavix/Clopidogrel, Brilinta/Ticagrelor or Effient/Prasugrel) prescribed (includes medically managed patients)? - Yes Beta Blocker prescribed? - Yes High Intensity Statin (Lipitor 40-40m or Crestor 20-47m prescribed? - Yes EF assessed during THIS hospitalization? - Yes For EF <40%, was ACEI/ARB prescribed? - No - Reason:  CKD For EF <40%, Aldosterone Antagonist (Spironolactone or Eplerenone) prescribed? - No - Reason:  CKD Cardiac Rehab Phase II ordered (including medically managed  patients)? - No - significantly deconditioned        The patient will be scheduled for a TOC follow up appointment in 10-14 days.  [facility] at the office where the patient should be seen for follow up.  _____________  Discharge Vitals Blood pressure 106/70, pulse 67, temperature 97.7 F (36.5 C), temperature source Oral, resp. rate 12, height '5\' 1"'  (1.549 m), weight 90.6 kg, SpO2 97 %.  Filed Weights   09/12/21 0416 09/14/21 0031 09/14/21 0537  Weight: 88 kg 90.1 kg 90.6 kg    Labs & Radiologic Studies    CBC Recent Labs    09/13/21 0615 09/14/21 0138  WBC 11.1* 10.6*  HGB 8.5* 8.1*  HCT 27.1* 25.5*  MCV 97.5 97.7  PLT 240 21431 Basic Metabolic Panel Recent Labs    09/12/21 1031  NA 137  K 4.0  CL 112*  CO2 18*  GLUCOSE 153*  BUN 36*  CREATININE 3.82*  CALCIUM 8.2*  MG 2.0   Liver Function Tests No results for input(s): "AST", "ALT", "ALKPHOS", "BILITOT", "PROT", "ALBUMIN" in the last 72 hours. No results for input(s): "LIPASE", "AMYLASE" in the last 72 hours. High Sensitivity Troponin:   Recent Labs  Lab 09/10/21 2003 09/10/21 2134 09/12/21 1031  TROPONINIHS 260* 369* 22,550*    BNP Invalid input(s): "POCBNP" D-Dimer No results for input(s): "DDIMER" in the last 72 hours. Hemoglobin A1C No results for input(s): "HGBA1C" in the last 72 hours. Fasting Lipid Panel No results for input(s): "CHOL", "HDL", "LDLCALC", "TRIG", "CHOLHDL", "LDLDIRECT" in the last 72 hours. Thyroid Function Tests No results for input(s): "TSH", "T4TOTAL", "T3FREE", "THYROIDAB" in the last 72 hours.  Invalid input(s): "FREET3" _____________  DG Ankle 2 Views Right  Result Date: 09/14/2021 CLINICAL DATA:  Pain, recent fall EXAM: RIGHT ANKLE - 2 VIEW COMPARISON:  None Available. FINDINGS: No fracture or dislocation is seen. Small plantar spur is seen in calcaneus. IMPRESSION: No fracture or dislocation is seen in right ankle.  Small plantar spur is seen in calcaneus. Electronically Signed   By: PaElmer Picker.D.   On: 09/14/2021 14:00   ECHOCARDIOGRAM COMPLETE  Result Date: 09/11/2021    ECHOCARDIOGRAM REPORT   Patient Name:   LIFINNLEY LEWISate of Exam: 09/11/2021 Medical Rec #:  [id number]  Height:       61.0 in Accession #:    [id number] Weight:       188.1 lb Date of Birth:  [birth year] BSA:          1.840 m Patient Age:    [age]     BP:           141/98 mmHg Patient Gender: F  HR:           79 bpm. Exam Location:  Inpatient Procedure: 2D Echo, Cardiac Doppler, Color Doppler and Intracardiac            Opacification Agent Indications:    Acute ischemic heart disease, unspecified I24.9  History:        Patient has prior history of Echocardiogram examinations, most                 recent 06/25/2021. CHF, CAD, NSTEMI and Previous Myocardial                 Infarction, Stroke; Risk Factors:Hypertension, Diabetes and                 Dyslipidemia. Chronic kidney disease.  Sonographer:    Darlina Sicilian RDCS Referring Phys: [id number] [place]  1. Left ventricular ejection fraction, by estimation, is 25 to 30%. The left ventricle has severely decreased function. The left ventricle demonstrates global hypokinesis. The left ventricular internal cavity size was moderately dilated. Left ventricular diastolic function could not be evaluated.  2. Right ventricular systolic function is moderately reduced. The right ventricular size is normal. There is moderately elevated pulmonary artery systolic pressure. The estimated right ventricular systolic pressure is 09.9 mmHg.  3. Pulmonary vein systolic blunting of right sided veins. 2D VC 0.4. Difficult 2D PISA assessment. The mitral valve is normal in structure. Moderate to severe mitral valve regurgitation. Ventricular function etiology No evidence of mitral stenosis.  4. The aortic valve was not well visualized. There is mild calcification of the aortic valve.  There is mild thickening of the aortic valve. Aortic valve regurgitation is not visualized. Aortic valve sclerosis is present, with no evidence of aortic valve  stenosis. Comparison(s): Worsened LV size, function and mitral regurgitation. Conclusion(s)/Recommendation(s): If patient is maximized on goal directed medical therapy, consider TEE for full assessment of mitral regurgitation for potential interventions. FINDINGS  Left Ventricle: Left ventricular ejection fraction, by estimation, is 25 to 30%. The left ventricle has severely decreased function. The left ventricle demonstrates global hypokinesis. Definity contrast agent was given IV to delineate the left ventricular endocardial borders. The left ventricular internal cavity size was moderately dilated. There is no left ventricular hypertrophy. Left ventricular diastolic function could not be evaluated due to mitral regurgitation (moderate or greater). Left ventricular diastolic function could not be evaluated. Right Ventricle: The right ventricular size is normal. No increase in right ventricular wall thickness. Right ventricular systolic function is moderately reduced. There is moderately elevated pulmonary artery systolic pressure. The tricuspid regurgitant velocity is 2.94 m/s, and with an assumed right atrial pressure of 15 mmHg, the estimated right ventricular systolic pressure is 83.3 mmHg. Left Atrium: Left atrial size was normal in size. Right Atrium: Right atrial size was normal in size. Pericardium: There is no evidence of pericardial effusion. Mitral Valve: Pulmonary vein systolic blunting of right sided veins. 2D VC 0.4. Difficult 2D PISA assessment. The mitral valve is normal in structure. Moderate to severe mitral valve regurgitation. No evidence of mitral valve stenosis. MV peak gradient, 1.4 mmHg. The mean mitral valve gradient is 3.0 mmHg with average heart rate of 80 bpm. Tricuspid Valve: The tricuspid valve is not well visualized. Tricuspid  valve regurgitation is not demonstrated. No evidence of tricuspid stenosis. Aortic Valve: The aortic valve was not well visualized. There is mild calcification of the aortic valve. There is mild thickening of the aortic valve. Aortic valve regurgitation is not  visualized. Aortic valve sclerosis is present, with no evidence of aortic valve stenosis. Pulmonic Valve: The pulmonic valve was not well visualized. Pulmonic valve regurgitation is not visualized. Aorta: The aortic root is normal in size and structure. IAS/Shunts: No atrial level shunt detected by color flow Doppler.  LEFT VENTRICLE PLAX 2D LVIDd:         5.90 cm      Diastology LVIDs:         5.00 cm      LV e' medial:    3.00 cm/s LV PW:         1.00 cm      LV E/e' medial:  48.3 LV IVS:        1.10 cm      LV e' lateral:   3.23 cm/s LVOT diam:     1.90 cm      LV E/e' lateral: 44.9 LV SV:         28 LV SV Index:   15 LVOT Area:     2.84 cm  LV Volumes (MOD) LV vol d, MOD A2C: 218.0 ml LV vol d, MOD A4C: 208.0 ml LV vol s, MOD A2C: 166.0 ml LV vol s, MOD A4C: 140.0 ml LV SV MOD A2C:     52.0 ml LV SV MOD A4C:     208.0 ml LV SV MOD BP:      66.5 ml RIGHT VENTRICLE RV S prime:     7.35 cm/s TAPSE (M-mode): 1.2 cm LEFT ATRIUM             Index        RIGHT ATRIUM          Index LA diam:        3.60 cm 1.96 cm/m   RA Area:     9.73 cm LA Vol (A2C):   45.4 ml 24.67 ml/m  RA Volume:   19.60 ml 10.65 ml/m LA Vol (A4C):   40.6 ml 22.07 ml/m LA Biplane Vol: 46.3 ml 25.16 ml/m  AORTIC VALVE LVOT Vmax:   75.80 cm/s LVOT Vmean:  40.900 cm/s LVOT VTI:    0.099 m  AORTA Ao Root diam: 3.10 cm MITRAL VALVE                  TRICUSPID VALVE MV Area (PHT): 6.90 cm       TR Peak grad:   34.6 mmHg MV Area VTI:   2.36 cm       TR Vmax:        294.00 cm/s MV Peak grad:  1.4 mmHg MV Mean grad:  3.0 mmHg       SHUNTS MV Vmax:       0.60 m/s       Systemic VTI:  0.10 m MV Vmean:      31.6 cm/s      Systemic Diam: 1.90 cm MV Decel Time: 110 msec MR Peak grad:    92.9 mmHg  MR Mean grad:    62.0 mmHg MR Vmax:         482.00 cm/s MR Vmean:        371.0 cm/s MR PISA:         2.26 cm MR PISA Eff ROA: 17 mm MR PISA Radius:  0.60 cm MV E velocity: 145.00 cm/s MV A velocity: 69.70 cm/s MV E/A ratio:  2.08 Rudean Haskell MD Electronically signed by Rudean Haskell MD Signature Date/Time: 09/11/2021/2:00:39 PM    Final  DG Chest Port 1 View  Result Date: 09/10/2021 CLINICAL DATA:  Chest pain EXAM: PORTABLE CHEST 1 VIEW COMPARISON:  06/24/2020 FINDINGS: Low lung volumes. Vascular crowding without frank interstitial edema. No pleural effusion or pneumothorax. The heart is top-normal in size for inspiration. IMPRESSION: Low lung volumes. No evidence of acute cardiopulmonary disease. Electronically Signed   By: Julian Hy M.D.   On: 09/10/2021 20:05    Disposition   Pt is being discharged home today in good condition.  Follow-up Plans & Appointments     Follow-up Information     Erma Heritage, PA-C Follow up on 09/28/2021.   Specialties: Physician Assistant, Cardiology Why: at 3:30pm for your follow up appt with Dr. Nelly Laurence PA Arthor Captain information: [place] [address] [phone]         Liana Gerold, MD Follow up.   Specialty: Nephrology Why: Please arrange outpatient follow up in the next 2-3 weeks Contact information: [address] [address] [phone]                Discharge Instructions     (HEART FAILURE PATIENTS) Call MD:  Anytime you have any of the following symptoms: 1) 3 pound weight gain in 24 hours or 5 pounds in 1 week 2) shortness of breath, with or without a dry hacking cough 3) swelling in the hands, feet or stomach 4) if you have to sleep on extra pillows at night in order to breathe.   Complete by: As directed    Call MD for:  difficulty breathing, headache or visual disturbances   Complete by: As directed    Call MD for:  persistant dizziness or  light-headedness   Complete by: As directed    Diet - low sodium heart healthy   Complete by: As directed    Increase activity slowly   Complete by: As directed        Discharge Medications   Allergies as of 09/14/2021       Reactions   Cedar Swelling      Other Swelling   Teriyaki        Medication List     STOP taking these medications    glipiZIDE 10 MG tablet Commonly known as: GLUCOTROL   Januvia 50 MG tablet Generic drug: sitaGLIPtin       TAKE these medications    Accu-Chek Guide Me w/Device Kit 1 Piece by Does not apply route as directed.   acetaminophen 500 MG tablet Commonly known as: TYLENOL Take 1,000-1,500 mg by mouth every 6 (six) hours as needed (migraine).   albuterol 108 (90 Base) MCG/ACT inhaler Commonly known as: VENTOLIN HFA Inhale 2 puffs into the lungs every 4 (four) hours as needed for shortness of breath.   aspirin EC 81 MG tablet Take 81 mg by mouth daily. Swallow whole.   atorvastatin 80 MG tablet Commonly known as: LIPITOR Take 1 tablet (80 mg total) by mouth at bedtime.   carvedilol 6.25 MG tablet Commonly known as: COREG Take 6.25 mg by mouth 2 (two) times daily.   clopidogrel 75 MG tablet Commonly known as: PLAVIX TAKE ONE TABLET BY MOUTH EVERY DAY WITH BREAKFAST What changed: See the new instructions.   ezetimibe 10 MG tablet Commonly known as: ZETIA Take 1 tablet (10 mg total) by mouth daily.   famotidine 20 MG tablet Commonly known as: PEPCID Take 20 mg by mouth daily.   ferrous sulfate 325 (65 FE) MG  tablet Take 1 tablet (325 mg total) by mouth daily with breakfast. Start taking on: September 15, 2021   gabapentin 300 MG capsule Commonly known as: NEURONTIN Take 300 mg by mouth 2 (two) times daily.   glucose blood test strip Commonly known as: Accu-Chek Guide Use as instructed 4 x daily. e11.65   glucose blood test strip 1 each by Other route 2 (two) times daily. Use as instructed bid. E11.65    icosapent Ethyl 1 g capsule Commonly known as: Vascepa Take 2 capsules (2 g total) by mouth 2 (two) times daily. With food   insulin detemir 100 UNIT/ML FlexPen Commonly known as: LEVEMIR Inject 15 Units into the skin at bedtime.   isosorbide mononitrate 30 MG 24 hr tablet Commonly known as: IMDUR Take 3 tablets (90 mg total) by mouth daily. Start taking on: September 15, 2021 What changed:  medication strength how much to take   Lancets Thin Misc 1 each by Does not apply route 4 (four) times daily. E11.65 Pt has Prodigy meter   levothyroxine 112 MCG tablet Commonly known as: SYNTHROID Take 112 mcg by mouth every morning.   nitroGLYCERIN 0.4 MG SL tablet Commonly known as: NITROSTAT Place 1 tablet (0.4 mg total) under the tongue every 5 (five) minutes as needed for chest pain.   Pen Needles 32G X 5 MM Misc Use 1 needle daily to inject insulin as instructed.   potassium chloride SA 20 MEQ tablet Commonly known as: KLOR-CON M Take 20 mEq by mouth 2 (two) times daily.   sodium bicarbonate 650 MG tablet Take 650 mg by mouth 2 (two) times daily.   STOOL SOFTENER PO Take 1 capsule by mouth daily as needed (constipation).   torsemide 20 MG tablet Commonly known as: DEMADEX Take 2 tablets (40 mg total) by mouth daily.   traZODone 50 MG tablet Commonly known as: DESYREL Take 50 mg by mouth at bedtime as needed for sleep.          Outstanding Labs/Studies   CBC BMET at follow up  Duration of Discharge Encounter   Greater than 30 minutes including physician time.  Signed, Reino Bellis, NP 09/14/2021, 2:44 PM

## 2021-09-14 NOTE — Progress Notes (Signed)
Patient complains of right ankle discomfort.  NP Mancel Bale notified, and xray ordered. Marcille Blanco, RN

## 2021-09-15 ENCOUNTER — Ambulatory Visit: Payer: Medicare Other | Admitting: Cardiology

## 2021-09-18 ENCOUNTER — Emergency Department (HOSPITAL_COMMUNITY)
Admission: EM | Admit: 2021-09-18 | Discharge: 2021-09-18 | Disposition: A | Discharge status: Home / Self Care | Payer: Medicare Other | Attending: Emergency Medicine | Admitting: Emergency Medicine

## 2021-09-18 ENCOUNTER — Encounter (HOSPITAL_COMMUNITY): Payer: Self-pay | Admitting: *Deleted

## 2021-09-18 ENCOUNTER — Other Ambulatory Visit: Payer: Self-pay

## 2021-09-18 DIAGNOSIS — Z7982 Long term (current) use of aspirin: Secondary | ICD-10-CM | POA: Insufficient documentation

## 2021-09-18 DIAGNOSIS — D72829 Elevated white blood cell count, unspecified: Secondary | ICD-10-CM | POA: Diagnosis not present

## 2021-09-18 DIAGNOSIS — I10 Essential (primary) hypertension: Secondary | ICD-10-CM | POA: Diagnosis not present

## 2021-09-18 DIAGNOSIS — R112 Nausea with vomiting, unspecified: Secondary | ICD-10-CM

## 2021-09-18 DIAGNOSIS — E876 Hypokalemia: Secondary | ICD-10-CM | POA: Diagnosis not present

## 2021-09-18 DIAGNOSIS — Z79899 Other long term (current) drug therapy: Secondary | ICD-10-CM | POA: Insufficient documentation

## 2021-09-18 DIAGNOSIS — E119 Type 2 diabetes mellitus without complications: Secondary | ICD-10-CM | POA: Diagnosis not present

## 2021-09-18 DIAGNOSIS — Z7902 Long term (current) use of antithrombotics/antiplatelets: Secondary | ICD-10-CM | POA: Diagnosis not present

## 2021-09-18 DIAGNOSIS — Z794 Long term (current) use of insulin: Secondary | ICD-10-CM | POA: Insufficient documentation

## 2021-09-18 LAB — CBC WITH DIFFERENTIAL/PLATELET
Abs Immature Granulocytes: 0.08 10*3/uL — ABNORMAL HIGH (ref 0.00–0.07)
Basophils Absolute: 0 10*3/uL (ref 0.0–0.1)
Basophils Relative: 0 %
Eosinophils Absolute: 0 10*3/uL (ref 0.0–0.5)
Eosinophils Relative: 0 %
HCT: 30.9 % — ABNORMAL LOW (ref 36.0–46.0)
Hemoglobin: 9.9 g/dL — ABNORMAL LOW (ref 12.0–15.0)
Immature Granulocytes: 1 %
Lymphocytes Relative: 8 %
Lymphs Abs: 1.1 10*3/uL (ref 0.7–4.0)
MCH: 30.8 pg (ref 26.0–34.0)
MCHC: 32 g/dL (ref 30.0–36.0)
MCV: 96.3 fL (ref 80.0–100.0)
Monocytes Absolute: 0.9 10*3/uL (ref 0.1–1.0)
Monocytes Relative: 6 %
Neutro Abs: 11.2 10*3/uL — ABNORMAL HIGH (ref 1.7–7.7)
Neutrophils Relative %: 85 %
Platelets: 345 10*3/uL (ref 150–400)
RBC: 3.21 MIL/uL — ABNORMAL LOW (ref 3.87–5.11)
RDW: 15.4 % (ref 11.5–15.5)
WBC: 13.3 10*3/uL — ABNORMAL HIGH (ref 4.0–10.5)
nRBC: 0.6 % — ABNORMAL HIGH (ref 0.0–0.2)

## 2021-09-18 LAB — COMPREHENSIVE METABOLIC PANEL
ALT: 57 U/L — ABNORMAL HIGH (ref 0–44)
AST: 63 U/L — ABNORMAL HIGH (ref 15–41)
Albumin: 3.4 g/dL — ABNORMAL LOW (ref 3.5–5.0)
Alkaline Phosphatase: 89 U/L (ref 38–126)
Anion gap: 6 (ref 5–15)
BUN: 46 mg/dL — ABNORMAL HIGH (ref 6–20)
CO2: 20 mmol/L — ABNORMAL LOW (ref 22–32)
Calcium: 8.4 mg/dL — ABNORMAL LOW (ref 8.9–10.3)
Chloride: 108 mmol/L (ref 98–111)
Creatinine, Ser: 3.58 mg/dL — ABNORMAL HIGH (ref 0.44–1.00)
GFR, Estimated: 14 mL/min — ABNORMAL LOW (ref >60)
Glucose, Bld: 202 mg/dL — ABNORMAL HIGH (ref 70–99)
Potassium: 3 mmol/L — ABNORMAL LOW (ref 3.5–5.1)
Sodium: 134 mmol/L — ABNORMAL LOW (ref 135–145)
Total Bilirubin: 0.7 mg/dL (ref 0.3–1.2)
Total Protein: 7 g/dL (ref 6.5–8.1)

## 2021-09-18 MED ORDER — POTASSIUM CHLORIDE CRYS ER 20 MEQ PO TBCR: 20.0000 meq | EXTENDED_RELEASE_TABLET | Freq: Every day | ORAL | 0 refills | Status: AC

## 2021-09-18 MED ORDER — METOCLOPRAMIDE HCL 5 MG/ML IJ SOLN
10.0000 mg | Freq: Once | INTRAMUSCULAR | Status: AC
  Administered 2021-09-18: 10 mg via INTRAVENOUS
  Filled 2021-09-18: qty 2

## 2021-09-18 MED ORDER — POTASSIUM CHLORIDE CRYS ER 20 MEQ PO TBCR
40.0000 meq | EXTENDED_RELEASE_TABLET | Freq: Once | ORAL | Status: AC
  Administered 2021-09-18: 40 meq via ORAL
  Filled 2021-09-18: qty 2

## 2021-09-18 MED ORDER — METOCLOPRAMIDE HCL 10 MG PO TABS: 10.0000 mg | ORAL_TABLET | Freq: Four times a day (QID) | ORAL | 0 refills | Status: AC

## 2021-09-18 NOTE — ED Triage Notes (Addendum)
BIB [facility] from home for weakness, ongoing since d/c last week. Admitted for NSTEMI. H/o CHF, pt of CHF clinic. Was started on ferrous sulfate. Hgb usually 8-9 per EMR. Arrives alert, NAD, calm, interactive, resps e/u, speaking in clear complete sentences, pale, skin W&D. Denies pain.

## 2021-09-18 NOTE — ED Provider Notes (Signed)
[facility] EMERGENCY DEPARTMENT Provider Note   CSN: [id number] Arrival date & time: 09/18/21  1250     History  Chief Complaint  Patient presents with   Weakness    Monica Carlson is a 56 y.o. female.   Weakness   This patient is a 56 year old female, she has a known history of diabetes, insulin requiring, she has hypertension and known cardiac disease currently taking long-acting nitrates as well as antihypertensives and Plavix.  The patient was recently admitted to the hospital on July 21 and diagnosed with a non-ST elevation myocardial infarction likely secondary to hypertension and underlying coronary disease.  The patient had an ejection fraction of 25 to 30%.  Left heart catheterization was done on March 23, 2021 when the patient was noted to have 100% stenosis with collaterals present, some other 75% lesions.n cardiothoracic surgery was consulted during her admission in January and made recommendations, nonsurgical candidate at that time.  Multiple coronary disease with "poor targets for grafting" recent COVID pneumonitis, significant comorbid renal disease.  She presents today stating that she has been on iron supplements since discharge due to being told that she had an iron deficiency, for the last 5 days she has been taking the iron which has been making her nauseated, today she vomited twice after taking the iron.  Blood work obtained on September 14, 2021 showed an MCV of 97, hemoglobin of 8.1 which had been relatively consistent over time.  There was no significant blood loss, and she denies having any frank bloody stools or loss of blood in any other way.  Specifically the patient is denying chest pain shortness of breath or diarrhea, she has no urinary symptoms, she did have nausea and vomiting today but states at this time she is completely symptom-free and not feeling poorly at all.  Home Medications Prior to Admission medications   Medication Sig Start Date End Date  Taking? Authorizing Provider  acetaminophen (TYLENOL) 500 MG tablet Take 1,000-1,500 mg by mouth every 6 (six) hours as needed (migraine).   Yes [provider]  albuterol (VENTOLIN HFA) 108 (90 Base) MCG/ACT inhaler Inhale 2 puffs into the lungs every 4 (four) hours as needed for shortness of breath. 02/27/21  Yes [provider]  aspirin EC 81 MG tablet Take 81 mg by mouth daily. Swallow whole.   Yes [provider]  atorvastatin (LIPITOR) 80 MG tablet Take 1 tablet (80 mg total) by mouth at bedtime. 03/24/21 09/18/21 Yes [place] Eric J, DO  carvedilol (COREG) 6.25 MG tablet Take 6.25 mg by mouth 2 (two) times daily. 02/09/21  Yes [provider]  Docusate Calcium (STOOL SOFTENER PO) Take 1 capsule by mouth daily as needed (constipation).   Yes [provider]  ezetimibe (ZETIA) 10 MG tablet Take 1 tablet (10 mg total) by mouth daily. 04/15/21  Yes Turner, Eber Hong, MD  famotidine (PEPCID) 20 MG tablet Take 20 mg by mouth daily. 06/16/21 06/16/22 Yes [provider]  ferrous sulfate 325 (65 FE) MG tablet Take 1 tablet (325 mg total) by mouth daily with breakfast. 09/15/21  Yes Reino Bellis B, NP  gabapentin (NEURONTIN) 300 MG capsule Take 300 mg by mouth 2 (two) times daily. 10/11/17  Yes [provider]  icosapent Ethyl (VASCEPA) 1 g capsule Take 2 capsules (2 g total) by mouth 2 (two) times daily. With food 04/15/21  Yes Turner, Traci R, MD  insulin detemir (LEVEMIR) 100 UNIT/ML FlexPen Inject 15 Units into the skin  at bedtime. 04/23/21  Yes Barton Dubois, MD  isosorbide mononitrate (IMDUR) 30 MG 24 hr tablet Take 3 tablets (90 mg total) by mouth daily. 09/15/21  Yes Cheryln Manly, NP  levothyroxine (SYNTHROID) 112 MCG tablet Take 112 mcg by mouth every morning. 07/14/21  Yes [provider]  metoCLOPramide (REGLAN) 10 MG tablet Take 1 tablet (10 mg total) by mouth every 6 (six) hours. 09/18/21  Yes Noemi Chapel, MD  nitroGLYCERIN  (NITROSTAT) 0.4 MG SL tablet Place 1 tablet (0.4 mg total) under the tongue every 5 (five) minutes as needed for chest pain. 08/15/17  Yes Hagler, Apolonio Schneiders, MD  potassium chloride SA (KLOR-CON M) 20 MEQ tablet Take 1 tablet (20 mEq total) by mouth daily for 5 doses. 09/18/21 09/23/21 Yes Noemi Chapel, MD  sodium bicarbonate 650 MG tablet Take 650 mg by mouth 2 (two) times daily.   Yes [provider]  torsemide (DEMADEX) 20 MG tablet Take 2 tablets (40 mg total) by mouth daily. 07/01/21  Yes Barton Dubois, MD  Blood Glucose Monitoring Suppl (ACCU-CHEK GUIDE ME) w/Device KIT 1 Piece by Does not apply route as directed. 07/09/18   Cassandria Anger, MD  clopidogrel (PLAVIX) 75 MG tablet TAKE ONE TABLET BY MOUTH EVERY DAY WITH BREAKFAST Patient not taking: Reported on 09/18/2021 07/10/17   Caren Macadam, MD  glucose blood (ACCU-CHEK GUIDE) test strip Use as instructed 4 x daily. e11.65 07/10/18   Cassandria Anger, MD  glucose blood test strip 1 each by Other route 2 (two) times daily. Use as instructed bid. E11.65 12/18/18   Cassandria Anger, MD  Insulin Pen Needle (PEN NEEDLES) 32G X 5 MM MISC Use 1 needle daily to inject insulin as instructed. 04/23/21   Barton Dubois, MD  Lancets Thin MISC 1 each by Does not apply route 4 (four) times daily. E11.65 Pt has Prodigy meter 10/02/18   Cassandria Anger, MD  traZODone (DESYREL) 50 MG tablet Take 50 mg by mouth at bedtime as needed for sleep. Patient not taking: Reported on 09/18/2021 06/08/21   [provider]      [place] and Other    Review of Systems   Review of Systems  Neurological:  Positive for weakness.  All other systems reviewed and are negative.   Physical Exam Updated Vital Signs BP (!) 135/92   Pulse 63   Temp 98.5 F (36.9 C) (Oral)   Resp 17   Ht 1.549 m (_0 )   Wt 86.2 kg   SpO2 93%   BMI 35.90 kg/m  Physical Exam Vitals and nursing note reviewed.  Constitutional:      General: She  is not in acute distress.    Appearance: She is well-developed.  HENT:     Head: Normocephalic and atraumatic.     Mouth/Throat:     Pharynx: No oropharyngeal exudate.  Eyes:     General: No scleral icterus.       Right eye: No discharge.        Left eye: No discharge.     Conjunctiva/sclera: Conjunctivae normal.     Pupils: Pupils are equal, round, and reactive to light.  Neck:     Thyroid: No thyromegaly.     Vascular: No JVD.  Cardiovascular:     Rate and Rhythm: Normal rate and regular rhythm.     Heart sounds: Normal heart sounds. No murmur heard.    No friction rub. No gallop.  Pulmonary:  Effort: Pulmonary effort is normal. No respiratory distress.     Breath sounds: Normal breath sounds. No wheezing or rales.  Abdominal:     General: Bowel sounds are normal. There is no distension.     Palpations: Abdomen is soft. There is no mass.     Tenderness: There is no abdominal tenderness.  Musculoskeletal:        General: No tenderness. Normal range of motion.     Cervical back: Normal range of motion and neck supple.  Lymphadenopathy:     Cervical: No cervical adenopathy.  Skin:    General: Skin is warm and dry.     Findings: No erythema or rash.  Neurological:     Mental Status: She is alert.     Coordination: Coordination normal.  Psychiatric:        Behavior: Behavior normal.     ED Results / Procedures / Treatments   Labs (all labs ordered are listed, but only abnormal results are displayed) Labs Reviewed  CBC WITH DIFFERENTIAL/PLATELET - Abnormal; Notable for the following components:      Result Value   WBC 13.3 (*)    RBC 3.21 (*)    Hemoglobin 9.9 (*)    HCT 30.9 (*)    nRBC 0.6 (*)    Neutro Abs 11.2 (*)    Abs Immature Granulocytes 0.08 (*)    All other components within normal limits  COMPREHENSIVE METABOLIC PANEL - Abnormal; Notable for the following components:   Sodium 134 (*)    Potassium 3.0 (*)    CO2 20 (*)    Glucose, Bld 202 (*)     BUN 46 (*)    Creatinine, Ser 3.58 (*)    Calcium 8.4 (*)    Albumin 3.4 (*)    AST 63 (*)    ALT 57 (*)    GFR, Estimated 14 (*)    All other components within normal limits    EKG None  Radiology No results found.  Procedures Procedures    Medications Ordered in ED Medications  potassium chloride SA (KLOR-CON M) CR tablet 40 mEq (has no administration in time range)  metoCLOPramide (REGLAN) injection 10 mg (10 mg Intravenous Given 09/18/21 1328)    ED Course/ Medical Decision Making/ A&P                           Medical Decision Making Amount and/or Complexity of Data Reviewed Labs: ordered. ECG/medicine tests: ordered.  Risk Prescription drug management.   This patient is having absolutely no symptoms including shortness of breath or chest pain, she has no edema of her legs, she has an unremarkable exam.  I am concerned about the nausea and vomiting which may be associated with the increased iron supplementation this week.  She seems to get nauseated every time she takes the pills.  At this time she is not feeling any symptoms whatsoever.  We will check some labs to make sure there is nothing else going on otherwise patient is stable for discharge and may need alternative therapies to iron supplements.  She can follow-up with primary care physician for that.  Labs: I personally viewed and interpreted the labs which show hypokalemia, the patient was given potassium supplementation, there is a leukocytosis which is benign and likely related to the vomiting.  She received IV fluids and Reglan and feels much better.  She has no more nausea, she is stable for discharge.  Final Clinical Impression(s) / ED Diagnoses Final diagnoses:  Nausea and vomiting, unspecified vomiting type  Leukocytosis, unspecified type  Hypokalemia    Rx / DC Orders ED Discharge Orders          Ordered    metoCLOPramide (REGLAN) 10 MG tablet  Every 6 hours        09/18/21 1406     potassium chloride SA (KLOR-CON M) 20 MEQ tablet  Daily        09/18/21 1406              Noemi Chapel, MD 09/18/21 1408

## 2021-09-18 NOTE — ED Notes (Signed)
Up to br

## 2021-09-18 NOTE — ED Notes (Signed)
EDP at [facility] 

## 2021-09-18 NOTE — Discharge Instructions (Addendum)
Your testing is reassuring You do have a high white blood cell count This will need to be rechecked within 1 week  You do have a low potassium, take 1 dose of potassium daily for the next 5 days to help keep it up  Due to the nausea and vomiting please take Reglan every 8 hours as needed for nausea and vomiting.  Please stop taking the iron supplements until you follow-up with your doctor.  Thankfully your blood counts have come up significantly since your hospitalization and if the iron is making you nauseated then it is not worth taking it at this time.

## 2021-09-24 ENCOUNTER — Encounter (HOSPITAL_COMMUNITY): Payer: Self-pay

## 2021-09-24 ENCOUNTER — Emergency Department (HOSPITAL_COMMUNITY)
Admission: EM | Admit: 2021-09-24 | Discharge: 2021-09-24 | Disposition: A | Discharge status: Home / Self Care | Payer: Medicare Other | Attending: Emergency Medicine | Admitting: Emergency Medicine

## 2021-09-24 DIAGNOSIS — N390 Urinary tract infection, site not specified: Secondary | ICD-10-CM | POA: Diagnosis not present

## 2021-09-24 DIAGNOSIS — R531 Weakness: Secondary | ICD-10-CM

## 2021-09-24 DIAGNOSIS — I2510 Atherosclerotic heart disease of native coronary artery without angina pectoris: Secondary | ICD-10-CM | POA: Insufficient documentation

## 2021-09-24 DIAGNOSIS — Z7982 Long term (current) use of aspirin: Secondary | ICD-10-CM | POA: Insufficient documentation

## 2021-09-24 DIAGNOSIS — I130 Hypertensive heart and chronic kidney disease with heart failure and stage 1 through stage 4 chronic kidney disease, or unspecified chronic kidney disease: Secondary | ICD-10-CM | POA: Insufficient documentation

## 2021-09-24 DIAGNOSIS — I509 Heart failure, unspecified: Secondary | ICD-10-CM | POA: Insufficient documentation

## 2021-09-24 DIAGNOSIS — R112 Nausea with vomiting, unspecified: Secondary | ICD-10-CM | POA: Insufficient documentation

## 2021-09-24 DIAGNOSIS — N184 Chronic kidney disease, stage 4 (severe): Secondary | ICD-10-CM | POA: Insufficient documentation

## 2021-09-24 LAB — URINALYSIS, ROUTINE W REFLEX MICROSCOPIC
Bilirubin Urine: NEGATIVE
Glucose, UA: >=500 mg/dL — AB
Ketones, ur: 20 mg/dL — AB
Nitrite: POSITIVE — AB
Protein, ur: >=300 mg/dL — AB
Specific Gravity, Urine: 1.015 (ref 1.005–1.030)
WBC, UA: >50 WBC/hpf — ABNORMAL HIGH (ref 0–5)
pH: 5 (ref 5.0–8.0)

## 2021-09-24 LAB — CBC WITH DIFFERENTIAL/PLATELET
Abs Immature Granulocytes: 0.05 10*3/uL (ref 0.00–0.07)
Basophils Absolute: 0 10*3/uL (ref 0.0–0.1)
Basophils Relative: 0 %
Eosinophils Absolute: 0.1 10*3/uL (ref 0.0–0.5)
Eosinophils Relative: 1 %
HCT: 35.6 % — ABNORMAL LOW (ref 36.0–46.0)
Hemoglobin: 11 g/dL — ABNORMAL LOW (ref 12.0–15.0)
Immature Granulocytes: 0 %
Lymphocytes Relative: 18 %
Lymphs Abs: 2.1 10*3/uL (ref 0.7–4.0)
MCH: 30.4 pg (ref 26.0–34.0)
MCHC: 30.9 g/dL (ref 30.0–36.0)
MCV: 98.3 fL (ref 80.0–100.0)
Monocytes Absolute: 0.7 10*3/uL (ref 0.1–1.0)
Monocytes Relative: 6 %
Neutro Abs: 8.8 10*3/uL — ABNORMAL HIGH (ref 1.7–7.7)
Neutrophils Relative %: 75 %
Platelets: 327 10*3/uL (ref 150–400)
RBC: 3.62 MIL/uL — ABNORMAL LOW (ref 3.87–5.11)
RDW: 17.2 % — ABNORMAL HIGH (ref 11.5–15.5)
WBC: 11.7 10*3/uL — ABNORMAL HIGH (ref 4.0–10.5)
nRBC: 0.6 % — ABNORMAL HIGH (ref 0.0–0.2)

## 2021-09-24 LAB — COMPREHENSIVE METABOLIC PANEL
ALT: 46 U/L — ABNORMAL HIGH (ref 0–44)
AST: 23 U/L (ref 15–41)
Albumin: 3.2 g/dL — ABNORMAL LOW (ref 3.5–5.0)
Alkaline Phosphatase: 84 U/L (ref 38–126)
Anion gap: 9 (ref 5–15)
BUN: 27 mg/dL — ABNORMAL HIGH (ref 6–20)
CO2: 17 mmol/L — ABNORMAL LOW (ref 22–32)
Calcium: 8.3 mg/dL — ABNORMAL LOW (ref 8.9–10.3)
Chloride: 111 mmol/L (ref 98–111)
Creatinine, Ser: 2.83 mg/dL — ABNORMAL HIGH (ref 0.44–1.00)
GFR, Estimated: 19 mL/min — ABNORMAL LOW (ref >60)
Glucose, Bld: 164 mg/dL — ABNORMAL HIGH (ref 70–99)
Potassium: 3.4 mmol/L — ABNORMAL LOW (ref 3.5–5.1)
Sodium: 137 mmol/L (ref 135–145)
Total Bilirubin: 1.3 mg/dL — ABNORMAL HIGH (ref 0.3–1.2)
Total Protein: 6.5 g/dL (ref 6.5–8.1)

## 2021-09-24 LAB — BLOOD GAS, VENOUS
Acid-base deficit: 5.6 mmol/L — ABNORMAL HIGH (ref 0.0–2.0)
Bicarbonate: 19.3 mmol/L — ABNORMAL LOW (ref 20.0–28.0)
Drawn by: 5285
O2 Saturation: 80.7 %
Patient temperature: 36.7
pCO2, Ven: 35 mmHg — ABNORMAL LOW (ref 44–60)
pH, Ven: 7.35 (ref 7.25–7.43)
pO2, Ven: 49 mmHg — ABNORMAL HIGH (ref 32–45)

## 2021-09-24 LAB — LIPASE, BLOOD: Lipase: 28 U/L (ref 11–51)

## 2021-09-24 MED ORDER — POTASSIUM CHLORIDE CRYS ER 20 MEQ PO TBCR
20.0000 meq | EXTENDED_RELEASE_TABLET | Freq: Once | ORAL | Status: AC
  Administered 2021-09-24: 20 meq via ORAL
  Filled 2021-09-24: qty 1

## 2021-09-24 MED ORDER — CEPHALEXIN 250 MG PO CAPS
250.0000 mg | ORAL_CAPSULE | Freq: Once | ORAL | Status: AC
  Administered 2021-09-24: 250 mg via ORAL
  Filled 2021-09-24: qty 1

## 2021-09-24 MED ORDER — ONDANSETRON HCL 4 MG PO TABS: 4.0000 mg | ORAL_TABLET | Freq: Four times a day (QID) | ORAL | 0 refills | Status: AC

## 2021-09-24 MED ORDER — CEPHALEXIN 250 MG PO CAPS: 250.0000 mg | ORAL_CAPSULE | Freq: Two times a day (BID) | ORAL | 0 refills | Status: AC

## 2021-09-24 MED ORDER — SODIUM CHLORIDE 0.9 % IV BOLUS
500.0000 mL | Freq: Once | INTRAVENOUS | Status: AC
  Administered 2021-09-24: 500 mL via INTRAVENOUS

## 2021-09-24 MED ORDER — ONDANSETRON HCL 4 MG/2ML IJ SOLN
4.0000 mg | Freq: Once | INTRAMUSCULAR | Status: AC
Start: 2021-09-24 — End: 2021-09-24
  Administered 2021-09-24: 4 mg via INTRAVENOUS
  Filled 2021-09-24: qty 2

## 2021-09-24 NOTE — ED Notes (Signed)
Pt has been able to keep down water without incident.

## 2021-09-24 NOTE — ED Triage Notes (Addendum)
Pt BIBA from home. Pt states x 1 week she has been feeling unwell- weak, rundown.   Pt stopped taking iron after being told 7/29. Stated problems started after that.   Pt has been unable to keep down food since Saturday.  216 CBG VSS with EMS

## 2021-09-24 NOTE — Discharge Instructions (Addendum)
You were seen in the emergency department for your nausea and weakness.  Your urine was positive for a urinary tract infection which is likely contributing to your symptoms.  This is treated with antibiotics and you should complete the course as prescribed.  I have also given you a prescription of nausea medicine that you can continue to take as needed.  You should follow-up with your primary doctor in the next week to have your symptoms rechecked.  You should return to the emergency department if you have repetitive vomiting despite taking the nausea medicine and cannot keep down your antibiotics, you have fevers, you have back pain or abdominal pain or if you have any other new or concerning symptoms.

## 2021-09-24 NOTE — ED Provider Notes (Signed)
[facility] EMERGENCY DEPARTMENT Provider Note   CSN: [id number] Arrival date & time: 09/24/21  2836     History  Chief Complaint  Patient presents with   Weakness    Monica Carlson is a 56 y.o. female.  Patient is a 56 year old female with a past medical history of CAD status post stent placement, CKD stage IV/V, CHF, diabetes, hypertension, anemia presenting to the emergency department with nausea and generalized weakness.  Patient was seen in the emergency department on 7/29 for similar symptoms.  At that time her work-up showed mild hypokalemia with a potassium of 3.0 for which she states she has been taking supplements.  She was also recommended to stop taking her iron supplementation at this time since her symptoms started shortly after being started on that medication.  She states despite stopping the iron, her symptoms have continued.  She states that she has not been able to keep down any food as she feels nauseous and vomits every time she tries to eat.  She denies any bilious or bloody vomitus.  She states she only has abdominal pain when she is vomiting.  She denies any diarrhea or constipation, fevers or chills, dysuria or hematuria and states she has had normal urine output.  She denies any associated chest pain.  She states she feels generally weak all over and denies any numbness or tingling.  She states that she has been taking her medications as prescribed and that her blood sugars have been well controlled at home.  She states she does not have any antiemetics to take at home to help with her symptoms.  She states she has a history of cholecystectomy and denies any tobacco, drug or alcohol use.  The history is provided by the patient.  Weakness      Home Medications Prior to Admission medications   Medication Sig Start Date End Date Taking? Authorizing Provider  acetaminophen (TYLENOL) 500 MG tablet Take 1,000-1,500 mg by mouth every 6 (six) hours as needed (migraine).    Yes [provider]  albuterol (VENTOLIN HFA) 108 (90 Base) MCG/ACT inhaler Inhale 2 puffs into the lungs every 4 (four) hours as needed for shortness of breath. 02/27/21  Yes [provider]  aspirin EC 81 MG tablet Take 81 mg by mouth daily. Swallow whole.   Yes [provider]  atorvastatin (LIPITOR) 80 MG tablet Take 1 tablet (80 mg total) by mouth at bedtime. 03/24/21 09/24/21 Yes [place] Eric J, DO  carvedilol (COREG) 6.25 MG tablet Take 6.25 mg by mouth 2 (two) times daily. 02/09/21  Yes [provider]  cephALEXin (KEFLEX) 250 MG capsule Take 1 capsule (250 mg total) by mouth 2 (two) times daily for 7 days. 09/24/21 10/01/21 Yes Savas Elvin, Norman Herrlich, DO  ezetimibe (ZETIA) 10 MG tablet Take 1 tablet (10 mg total) by mouth daily. 04/15/21  Yes Turner, Eber Hong, MD  famotidine (PEPCID) 20 MG tablet Take 20 mg by mouth daily. 06/16/21 06/16/22 Yes [provider]  gabapentin (NEURONTIN) 300 MG capsule Take 300 mg by mouth 2 (two) times daily. 10/11/17  Yes [provider]  icosapent Ethyl (VASCEPA) 1 g capsule Take 2 capsules (2 g total) by mouth 2 (two) times daily. With food 04/15/21  Yes Turner, Traci R, MD  insulin detemir (LEVEMIR) 100 UNIT/ML FlexPen Inject 15 Units into the skin at bedtime. 04/23/21  Yes Barton Dubois, MD  isosorbide mononitrate (IMDUR) 30 MG 24 hr tablet Take 3 tablets (90 mg  total) by mouth daily. 09/15/21  Yes Cheryln Manly, NP  levothyroxine (SYNTHROID) 112 MCG tablet Take 112 mcg by mouth every morning. 07/14/21  Yes [provider]  metoCLOPramide (REGLAN) 10 MG tablet Take 1 tablet (10 mg total) by mouth every 6 (six) hours. 09/18/21  Yes Noemi Chapel, MD  nitroGLYCERIN (NITROSTAT) 0.4 MG SL tablet Place 1 tablet (0.4 mg total) under the tongue every 5 (five) minutes as needed for chest pain. 08/15/17  Yes Hagler, Apolonio Schneiders, MD  ondansetron (ZOFRAN) 4 MG tablet Take 1 tablet (4 mg total) by mouth every 6 (six) hours.  09/24/21  Yes Dejai Schubach, [place] K, DO  potassium chloride SA (KLOR-CON M) 20 MEQ tablet Take 1 tablet (20 mEq total) by mouth daily for 5 doses. 09/18/21 09/24/21 Yes Noemi Chapel, MD  sodium bicarbonate 650 MG tablet Take 650 mg by mouth 2 (two) times daily.   Yes [provider]  torsemide (DEMADEX) 20 MG tablet Take 2 tablets (40 mg total) by mouth daily. 07/01/21  Yes Barton Dubois, MD  traZODone (DESYREL) 50 MG tablet Take 50 mg by mouth at bedtime as needed for sleep. 06/08/21  Yes [provider]  Blood Glucose Monitoring Suppl (ACCU-CHEK GUIDE ME) w/Device KIT 1 Piece by Does not apply route as directed. 07/09/18   Cassandria Anger, MD  clopidogrel (PLAVIX) 75 MG tablet TAKE ONE TABLET BY MOUTH EVERY DAY WITH BREAKFAST Patient not taking: Reported on 09/18/2021 07/10/17   Caren Macadam, MD  Docusate Calcium (STOOL SOFTENER PO) Take 1 capsule by mouth daily as needed (constipation).    [provider]  ferrous sulfate 325 (65 FE) MG tablet Take 1 tablet (325 mg total) by mouth daily with breakfast. Patient not taking: Reported on 09/24/2021 09/15/21   Reino Bellis B, NP  glucose blood (ACCU-CHEK GUIDE) test strip Use as instructed 4 x daily. e11.65 07/10/18   Cassandria Anger, MD  glucose blood test strip 1 each by Other route 2 (two) times daily. Use as instructed bid. E11.65 12/18/18   Cassandria Anger, MD  Insulin Pen Needle (PEN NEEDLES) 32G X 5 MM MISC Use 1 needle daily to inject insulin as instructed. 04/23/21   Barton Dubois, MD  Lancets Thin MISC 1 each by Does not apply route 4 (four) times daily. E11.65 Pt has Prodigy meter 10/02/18   Nida, Marella Chimes, MD      Allergies    [place] and Other    Review of Systems   Review of Systems  Neurological:  Positive for weakness.    Physical Exam Updated Vital Signs BP (!) 134/91   Pulse 77   Temp 98.1 F (36.7 C) (Oral)   Resp 20   Ht _0  (1.549 m)   Wt 86.2 kg   SpO2 95%   BMI 35.90  kg/m  Physical Exam Vitals and nursing note reviewed.  Constitutional:      General: She is not in acute distress.    Appearance: She is obese. She is ill-appearing. She is not toxic-appearing or diaphoretic.  HENT:     Head: Normocephalic and atraumatic.     Nose: Nose normal.     Mouth/Throat:     Mouth: Mucous membranes are moist.  Eyes:     Extraocular Movements: Extraocular movements intact.     Conjunctiva/sclera: Conjunctivae normal.  Cardiovascular:     Rate and Rhythm: Normal rate and regular rhythm.     Pulses: Normal pulses.  Pulmonary:  Effort: Pulmonary effort is normal.     Breath sounds: Normal breath sounds.  Abdominal:     General: Abdomen is flat.     Palpations: Abdomen is soft.     Tenderness: There is no abdominal tenderness. There is no right CVA tenderness or left CVA tenderness.  Musculoskeletal:        General: Normal range of motion.     Cervical back: Normal range of motion and neck supple.  Skin:    General: Skin is warm and dry.  Neurological:     General: No focal deficit present.     Mental Status: She is alert and oriented to person, place, and time.     Sensory: No sensory deficit.     Motor: No weakness.  Psychiatric:        Mood and Affect: Mood normal.        Behavior: Behavior normal.     ED Results / Procedures / Treatments   Labs (all labs ordered are listed, but only abnormal results are displayed) Labs Reviewed  COMPREHENSIVE METABOLIC PANEL - Abnormal; Notable for the following components:      Result Value   Potassium 3.4 (*)    CO2 17 (*)    Glucose, Bld 164 (*)    BUN 27 (*)    Creatinine, Ser 2.83 (*)    Calcium 8.3 (*)    Albumin 3.2 (*)    ALT 46 (*)    Total Bilirubin 1.3 (*)    GFR, Estimated 19 (*)    All other components within normal limits  CBC WITH DIFFERENTIAL/PLATELET - Abnormal; Notable for the following components:   WBC 11.7 (*)    RBC 3.62 (*)    Hemoglobin 11.0 (*)    HCT 35.6 (*)    RDW  17.2 (*)    nRBC 0.6 (*)    Neutro Abs 8.8 (*)    All other components within normal limits  URINALYSIS, ROUTINE W REFLEX MICROSCOPIC - Abnormal; Notable for the following components:   APPearance HAZY (*)    Glucose, UA >=500 (*)    Hgb urine dipstick SMALL (*)    Ketones, ur 20 (*)    Protein, ur >=300 (*)    Nitrite POSITIVE (*)    Leukocytes,Ua SMALL (*)    WBC, UA >50 (*)    Bacteria, UA MANY (*)    All other components within normal limits  BLOOD GAS, VENOUS - Abnormal; Notable for the following components:   pCO2, Ven 35 (*)    pO2, Ven 49 (*)    Bicarbonate 19.3 (*)    Acid-base deficit 5.6 (*)    All other components within normal limits  LIPASE, BLOOD    EKG EKG Interpretation  Date/Time:  Friday September 24 2021 08:23:43 EDT Ventricular Rate:  72 PR Interval:  174 QRS Duration: 116 QT Interval:  452 QTC Calculation: 495 R Axis:   74 Text Interpretation: Sinus rhythm Nonspecific intraventricular conduction delay Low voltage, precordial leads Borderline T abnormalities, diffuse leads No significant change since last tracing Confirmed by Oneal Deputy [phone]) on 09/24/2021 8:37:38 AM  Radiology No results found.  Procedures Procedures    Medications Ordered in ED Medications  cephALEXin (KEFLEX) capsule 250 mg (has no administration in time range)  sodium chloride 0.9 % bolus 500 mL (500 mLs Intravenous New Bag/Given (Non-Interop) 09/24/21 0851)  ondansetron (ZOFRAN) injection 4 mg (4 mg Intravenous Given 09/24/21 0852)  potassium chloride SA (KLOR-CON M) CR tablet  20 mEq (20 mEq Oral Given 09/24/21 8338)    ED Course/ Medical Decision Making/ A&P Clinical Course as of 09/24/21 1030  Fri Sep 24, 2021  0913 Labs reviewed and interpreted by myself show improving hemoglobin and WBC from last visit.  Potassium low at 3.4, but improved from last visit.  Creatinine and BUN additionally improved from last visit showing recovering kidney function.  No signs of uremia  or DKA as causes of her nausea and vomiting.  Lipase is normal, making pancreatitis unlikely.  Her bicarb is low without an anion gap, likely secondary to her decreased appetite. She has a mild elevated in tbili but improvement of LFTs compared to prior labs. Urine is pending at this time [VK]  0924 Upon reassessment, the patient was asleep in bed resting comfortably.  She was easily arousable to verbal stimulation.  She states that her nausea has improved.  She will be attempted p.o. challenge. [VK]  1020 UA is positive for UTI, which is likely contributing to her nausea and weakness. She has been able to tolerate PO so will be treated with oral antibiotics and given outpatient follow up. [VK]    Clinical Course User Index [VK] Ottie Glazier, DO                           Medical Decision Making Patient is a 56 year old female with a past medical history of CAD status post stent placement, CKD stage IV/V, CHF, diabetes, hypertension, anemia presenting to the emergency department with nausea and generalized weakness.  I am concerned for possible DKA, dehydration, electrolyte abnormality, uremia or acidosis from renal failure as possible causes of her nausea and vomiting.  She has no focal neurologic deficits making CVA unlikely.  EKG was performed without any acute ischemic changes and without any chest pain or shortness of breath, cardiac etiology is less likely.  Patient will have labs, fluids and Zofran and will be reassessed.  Amount and/or Complexity of Data Reviewed External Data Reviewed: notes.    Details: Reviewed patient's recent ED visit as well as last cardiology and nephrology visit.  Last ED visit was found to be hypokalemic to 3.0 and was repleted.  Patient has been following with nephrology and is starting work-up for dialysis.  Patient's baseline creatinine is around 3.  He has multivessel cardiac disease but is not medically stable to undergo CABG at this time. Labs: ordered.  Decision-making details documented in ED Course. ECG/medicine tests: ordered. Decision-making details documented in ED Course.  Risk Prescription drug management.   Patient is stable for discharge home with outpatient primary care follow up. Questions were answered and strict return precautions were given.        Final Clinical Impression(s) / ED Diagnoses Final diagnoses:  Generalized weakness  Nausea and vomiting, unspecified vomiting type  Lower urinary tract infectious disease    Rx / DC Orders ED Discharge Orders          Ordered    cephALEXin (KEFLEX) 250 MG capsule  2 times daily        09/24/21 1029    ondansetron (ZOFRAN) 4 MG tablet  Every 6 hours        09/24/21 Needles, Woodlyn K, DO 09/24/21 1031

## 2021-09-28 ENCOUNTER — Ambulatory Visit: Payer: Medicare Other | Admitting: Student

## 2021-09-28 ENCOUNTER — Encounter: Payer: Self-pay | Admitting: Student

## 2021-09-28 NOTE — Progress Notes (Deleted)
Cardiology Office Note    Date:  09/28/2021   ID:  Monica Carlson, DOB 1965-08-14, MRN [id number]  PCP:  Monica Douglas, MD  Cardiologist: Carlyle Dolly, MD    No chief complaint on file.   History of Present Illness:    Monica Carlson is a 56 y.o. female with past medical history of CAD (s/p cath in 02/2021 showing multivessel CAD and not a candidate for PCI and not a candidate for CABG at that time), HFrEF (EF 40-45% in 02/2021, 35-40% in 06/2021 and 25-30% in 08/2021), HTN, HLD, Type 2 DM, Stage 4-5 CKD, anemia and history of CVA who presents to the office today for hospital follow-up.  She was examined by Monica Ferries, PA in 07/2021 following a recent hospitalization for an NSTEMI during which medical management was recommended as she was not a candidate for CABG or PCI.  Imdur was increased to 60 mg daily.  She was admitted for recurrent NSTEMI in 08/2021 with troponin values increasing to 22,550.  She was treated with IV heparin and IV nitroglycerin.  Repeat echocardiogram showed her EF was further reduced at 25 to 30% with global hypokinesis and her mitral regurgitation appeared to be in a moderate to severe range.  Films were again reviewed with interventional cardiology and given her diffuse disease in the LAD and LCx without good focal targets in the RCA being occluded with collaterals, she was not felt to be a candidate for CABG and did not have suitable anatomy for PCI.  She was found to have anemia during admission and workup was consistent with iron deficiency anemia, therefore she was started on iron therapy.   In the interim, she presented to [facility] ED on 09/18/2021 for nausea and vomiting which had started after taking iron supplementation following hospital discharge. Follow-up labs showed her hemoglobin had improved to 9.9 but she was hypokalemic and replacement was ordered. She presented back to the ED on 09/24/2021 for worsening weakness and reported still having  significant nausea and vomiting despite stopping iron. She was found to have a urinary tract infection and treated with Keflex and also prescribed Zofran. Labs at that time showed her creatinine had further improved to 2.83 and her hemoglobin had trended up to 11.0.   Past Medical History:  Diagnosis Date   Cervical cancer ([place])    cryotherapy and colposcopy/[place], [place]; 1991   CHF (congestive heart failure) ([place]) 07-2014   Diabetes mellitus without complication ([place]) 6812   High cholesterol    Hypertension    MI (myocardial infarction) ([place])    2004   Stroke ([place]) 09/2014, 2013   Vaginal Pap smear, abnormal     Past Surgical History:  Procedure Laterality Date   CHOLECYSTECTOMY  1992   CORONARY STENT PLACEMENT  2004   LEFT HEART CATH AND CORONARY ANGIOGRAPHY N/A 03/23/2021   Procedure: LEFT HEART CATH AND CORONARY ANGIOGRAPHY;  Surgeon: Jettie Booze, MD;  Location: [place] [facility];  Service: Cardiovascular;  Laterality: N/A;   TUBAL LIGATION      Current Medications: Outpatient Medications Prior to Visit  Medication Sig Dispense Refill   acetaminophen (TYLENOL) 500 MG tablet Take 1,000-1,500 mg by mouth every 6 (six) hours as needed (migraine).     albuterol (VENTOLIN HFA) 108 (90 Base) MCG/ACT inhaler Inhale 2 puffs into the lungs every 4 (four) hours as needed for shortness of breath.     aspirin EC 81 MG tablet Take 81 mg by mouth daily.  Swallow whole.     atorvastatin (LIPITOR) 80 MG tablet Take 1 tablet (80 mg total) by mouth at bedtime. 30 tablet 2   Blood Glucose Monitoring Suppl (ACCU-CHEK GUIDE ME) w/Device KIT 1 Piece by Does not apply route as directed. 1 kit 0   carvedilol (COREG) 6.25 MG tablet Take 6.25 mg by mouth 2 (two) times daily.     cephALEXin (KEFLEX) 250 MG capsule Take 1 capsule (250 mg total) by mouth 2 (two) times daily for 7 days. 14 capsule 0   clopidogrel (PLAVIX) 75 MG tablet TAKE ONE TABLET BY MOUTH EVERY DAY WITH BREAKFAST (Patient  not taking: Reported on 09/18/2021) 90 tablet 1   Docusate Calcium (STOOL SOFTENER PO) Take 1 capsule by mouth daily as needed (constipation).     ezetimibe (ZETIA) 10 MG tablet Take 1 tablet (10 mg total) by mouth daily. 30 tablet 11   famotidine (PEPCID) 20 MG tablet Take 20 mg by mouth daily.     ferrous sulfate 325 (65 FE) MG tablet Take 1 tablet (325 mg total) by mouth daily with breakfast. (Patient not taking: Reported on 09/24/2021) 90 tablet 1   gabapentin (NEURONTIN) 300 MG capsule Take 300 mg by mouth 2 (two) times daily.     glucose blood (ACCU-CHEK GUIDE) test strip Use as instructed 4 x daily. e11.65 150 each 2   glucose blood test strip 1 each by Other route 2 (two) times daily. Use as instructed bid. E11.65 200 each 2   icosapent Ethyl (VASCEPA) 1 g capsule Take 2 capsules (2 g total) by mouth 2 (two) times daily. With food 120 capsule 11   insulin detemir (LEVEMIR) 100 UNIT/ML FlexPen Inject 15 Units into the skin at bedtime. 15 mL 11   Insulin Pen Needle (PEN NEEDLES) 32G X 5 MM MISC Use 1 needle daily to inject insulin as instructed. 100 each 2   isosorbide mononitrate (IMDUR) 30 MG 24 hr tablet Take 3 tablets (90 mg total) by mouth daily. 90 tablet 0   Lancets Thin MISC 1 each by Does not apply route 4 (four) times daily. E11.65 Pt has Prodigy meter 150 each 2   levothyroxine (SYNTHROID) 112 MCG tablet Take 112 mcg by mouth every morning.     metoCLOPramide (REGLAN) 10 MG tablet Take 1 tablet (10 mg total) by mouth every 6 (six) hours. 30 tablet 0   nitroGLYCERIN (NITROSTAT) 0.4 MG SL tablet Place 1 tablet (0.4 mg total) under the tongue every 5 (five) minutes as needed for chest pain. 30 tablet 2   ondansetron (ZOFRAN) 4 MG tablet Take 1 tablet (4 mg total) by mouth every 6 (six) hours. 8 tablet 0   potassium chloride SA (KLOR-CON M) 20 MEQ tablet Take 1 tablet (20 mEq total) by mouth daily for 5 doses. 5 tablet 0   sodium bicarbonate 650 MG tablet Take 650 mg by mouth 2 (two)  times daily.     torsemide (DEMADEX) 20 MG tablet Take 2 tablets (40 mg total) by mouth daily.     traZODone (DESYREL) 50 MG tablet Take 50 mg by mouth at bedtime as needed for sleep.     Facility-Administered Medications Prior to Visit  Medication Dose Route Frequency Provider Last Rate Last Admin   glucose blood test strip STRP 1 each  1 each Other BID Dorris Fetch Marella Chimes, MD         Allergies:   Katharine Look and Other   Social History   Socioeconomic History  Marital status: Legally Separated    Spouse name: Not on file   Number of children: Not on file   Years of education: Not on file   Highest education level: Not on file  Occupational History   Not on file  Tobacco Use   Smoking status: Never   Smokeless tobacco: Never  Vaping Use   Vaping Use: Never used  Substance and Sexual Activity   Alcohol use: No   Drug use: No   Sexual activity: Yes    Birth control/protection: None, Surgical    Comment: tubal  Other Topics Concern   Not on file  Social History Narrative   Lives alone.    Has some friends.    Neighbors.    Social Determinants of Health   Financial Resource Strain: Not on file  Food Insecurity: Not on file  Transportation Needs: Not on file  Physical Activity: Not on file  Stress: Not on file  Social Connections: Not on file     Family History:  The patient's ***family history includes Diabetes in her father; Heart attack in her maternal grandfather; Heart disease in her father; Hypertension in her father; Lung cancer in her paternal grandmother; Migraines in her maternal grandfather and mother.   Review of Systems:    Please see the history of present illness.     All other systems reviewed and are otherwise negative except as noted above.   Physical Exam:    VS:  There were no vitals taken for this visit.   General: Well developed, well nourished,female appearing in no acute distress. Head: Normocephalic, atraumatic. Neck: No carotid bruits.  JVD not elevated.  Lungs: Respirations regular and unlabored, without wheezes or rales.  Heart: ***Regular rate and rhythm. No S3 or S4.  No murmur, no rubs, or gallops appreciated. Abdomen: Appears non-distended. No obvious abdominal masses. Msk:  Strength and tone appear normal for age. No obvious joint deformities or effusions. Extremities: No clubbing or cyanosis. No edema.  Distal pedal pulses are 2+ bilaterally. Neuro: Alert and oriented X 3. Moves all extremities spontaneously. No focal deficits noted. Psych:  Responds to questions appropriately with a normal affect. Skin: No rashes or lesions noted  Wt Readings from Last 3 Encounters:  09/24/21 190 lb (86.2 kg)  09/18/21 190 lb (86.2 kg)  09/14/21 199 lb 11.2 oz (90.6 kg)        Studies/Labs Reviewed:   EKG:  EKG is*** ordered today.  The ekg ordered today demonstrates ***  Recent Labs: 12/02/2020: TSH 16.100 05/02/2021: B Natriuretic Peptide 1,132.0 09/12/2021: Magnesium 2.0 09/24/2021: ALT 46; BUN 27; Creatinine, Ser 2.83; Hemoglobin 11.0; Platelets 327; Potassium 3.4; Sodium 137   Lipid Panel    Component Value Date/Time   CHOL 158 09/10/2021 2003   TRIG 77 09/10/2021 2003   HDL 29 (L) 09/10/2021 2003   CHOLHDL 5.4 09/10/2021 2003   VLDL 15 09/10/2021 2003   LDLCALC 114 (H) 09/10/2021 2003   LDLCALC 148 (H) 04/20/2017 1418    Additional studies/ records that were reviewed today include:   LHC: 02/2021   Mid RCA lesion is 100% stenosed.  Left to right collaterals.   2nd Mrg lesion is 90% stenosed.   Ost LAD to Mid LAD lesion is 75% stenosed.   1st Diag lesion is 75% stenosed.   LV end diastolic pressure is mildly elevated.   There is no aortic valve stenosis.   Severe, multivessel disease.  Diffuse disease throughout the left system.  Occluded  mid RCA stent.  Large right PDA and distal RCA system which fills by collaterals from the left.  Restart heparin in 8 hours.  Discussed with Dr. Martinique.  Will obtain  CVTS consult.  Contrast minimize due to renal dysfunction.   Message left for daughter, Karn Pickler.  Echocardiogram: 08/2021 IMPRESSIONS     1. Left ventricular ejection fraction, by estimation, is 25 to 30%. The  left ventricle has severely decreased function. The left ventricle  demonstrates global hypokinesis. The left ventricular internal cavity size  was moderately dilated. Left  ventricular diastolic function could not be evaluated.   2. Right ventricular systolic function is moderately reduced. The right  ventricular size is normal. There is moderately elevated pulmonary artery  systolic pressure. The estimated right ventricular systolic pressure is  84.1 mmHg.   3. Pulmonary vein systolic blunting of right sided veins. 2D VC 0.4.  Difficult 2D PISA assessment. The mitral valve is normal in structure.  Moderate to severe mitral valve regurgitation. Ventricular function  etiology No evidence of mitral stenosis.   4. The aortic valve was not well visualized. There is mild calcification  of the aortic valve. There is mild thickening of the aortic valve. Aortic  valve regurgitation is not visualized. Aortic valve sclerosis is present,  with no evidence of aortic valve   stenosis.   Comparison(s): Worsened LV size, function and mitral regurgitation.   Conclusion(s)/Recommendation(s): If patient is maximized on goal directed  medical therapy, consider TEE for full assessment of mitral regurgitation  for potential interventions.   Assessment:    No diagnosis found.   Plan:   In order of problems listed above:  1. CAD - She has known multivessel CAD by catheterization in 02/2021 and has suffered multiple NSTEMI since but is not a candidate for CABG and does not have suitable targets for PCI as discussed above.  2. HFrEF - ***  3. HLD - *** - Remains on Atorvastatin 42m daily and Zetia 158mdaily.   4. Stage 4-5 CKD - Baseline creatinine 3.3 - 3.4.  This had improved  to 2.83 by recent labs.  5. Anemia - Hgb was down to 8.1 during her recent admission, improved to 11.0 by recent labs on 09/24/2021.   Shared Decision Making/Informed Consent:   {Are you ordering a CV Procedure (e.g. stress test, cath, DCCV, TEE, etc)?   Press F2        :[id number]}  Medication Adjustments/Labs and Tests Ordered: Current medicines are reviewed at length with the patient today.  Concerns regarding medicines are outlined above.  Medication changes, Labs and Tests ordered today are listed in the Patient Instructions below. There are no Patient Instructions on file for this visit.   Signed, BrErma HeritagePA-C  09/28/2021 10:10 AM    [facility] [address] [place] [id number]: [phone]: [phone]

## 2021-10-22 DEATH — deceased

## 2021-11-01 IMAGING — DX DG KNEE COMPLETE 4+V*L*
4 series · 4 of 4 positions shown · non-contrast
Comparison: None

CLINICAL DATA: Pain, fall

EXAM:
LEFT KNEE - COMPLETE 4+ VIEW

[knee ap]
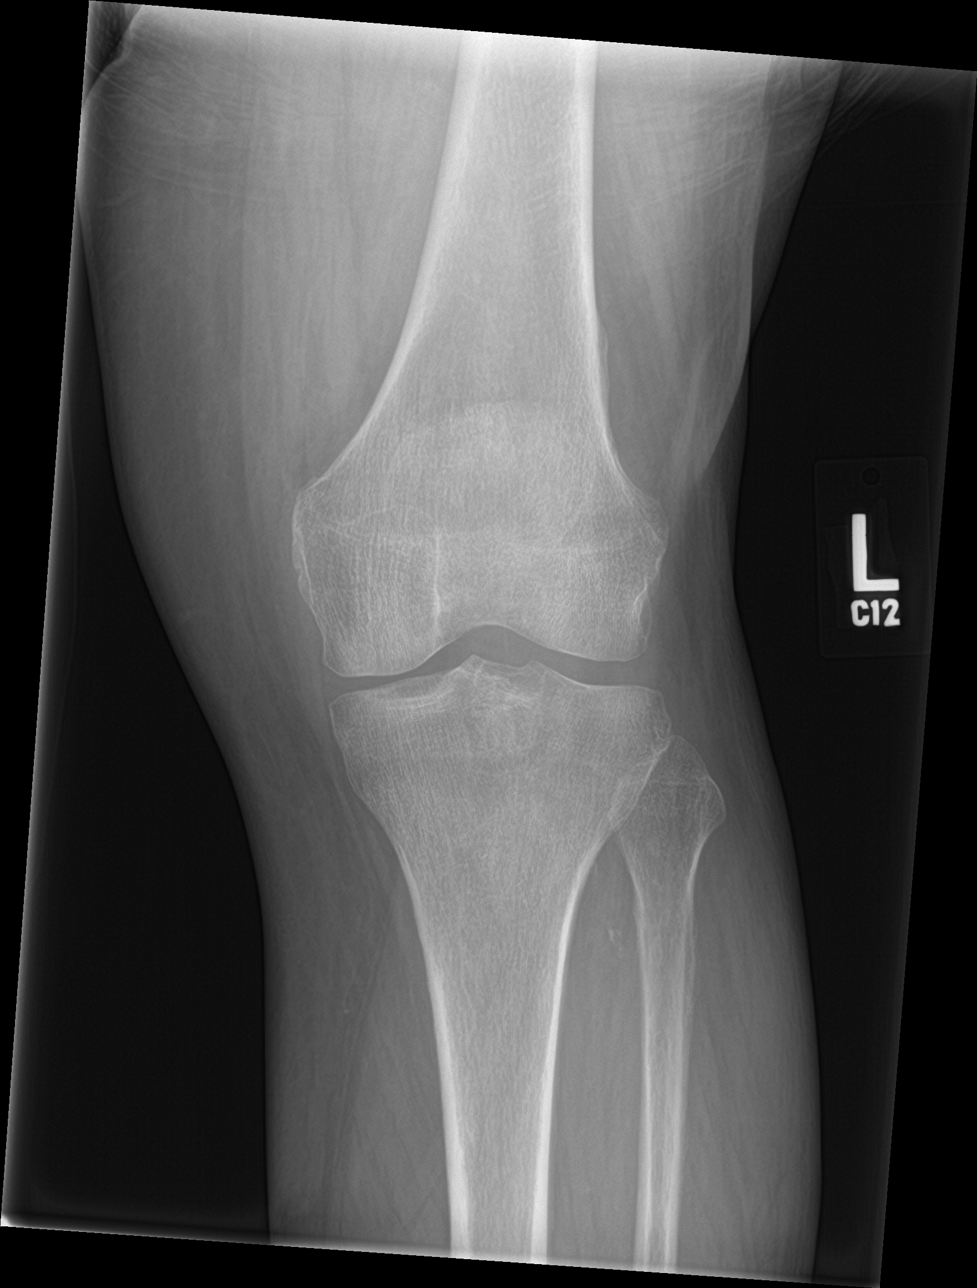

[knee obl (1 of 2)]
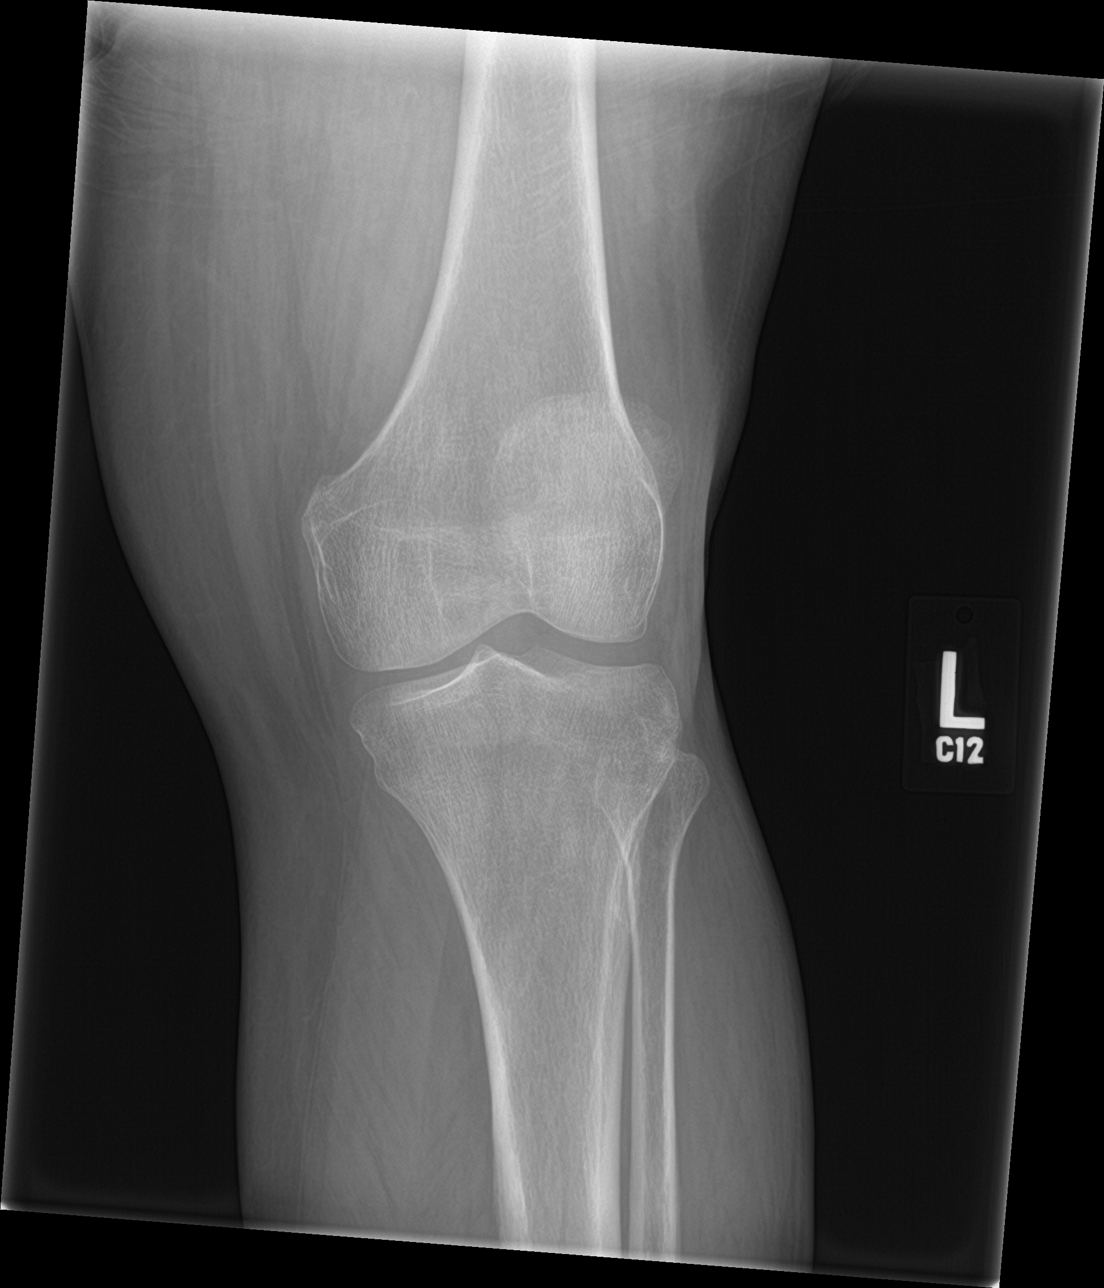

[knee obl (2 of 2)]
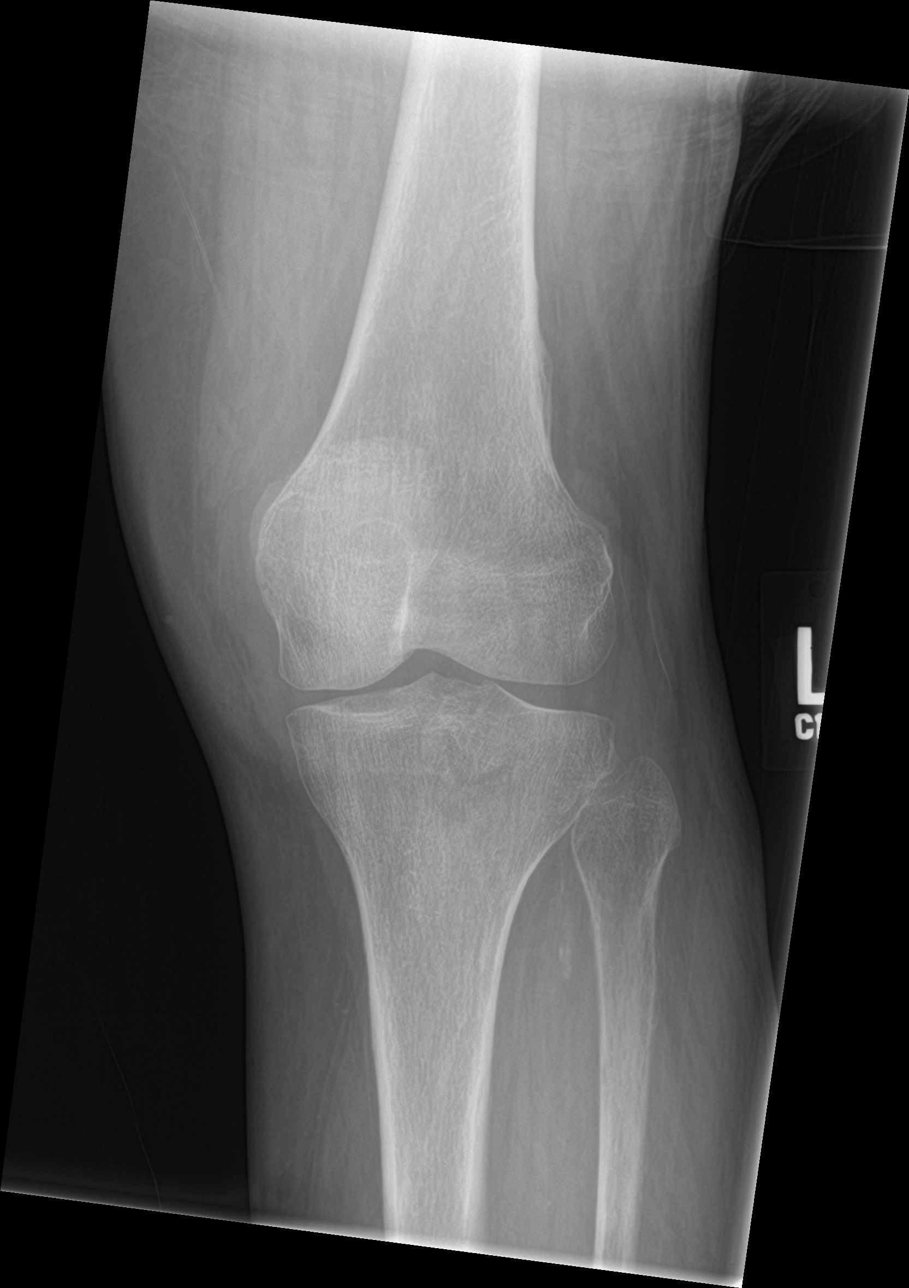

[knee lat]
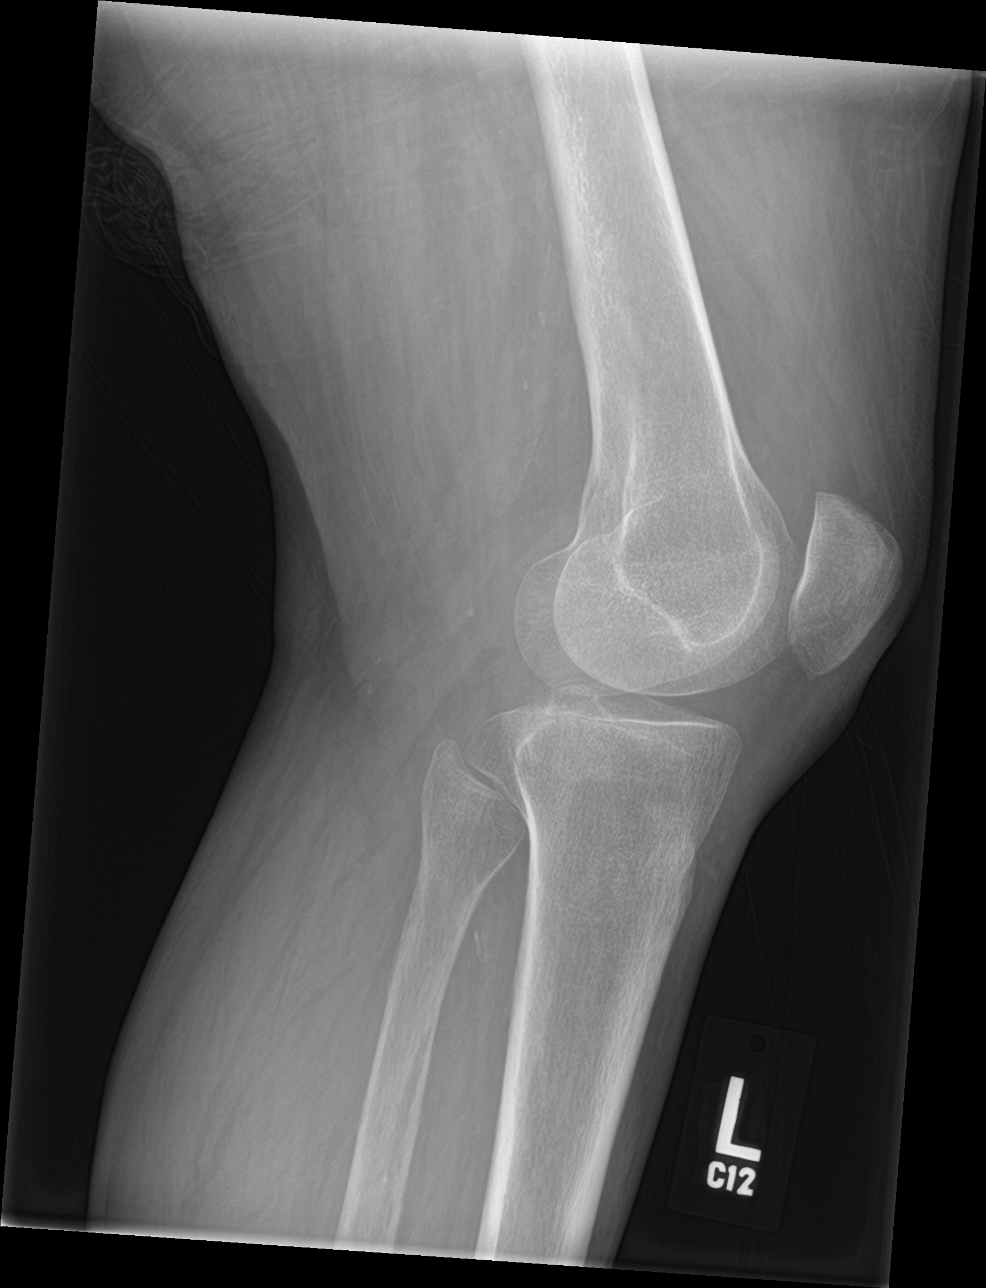

[4 of 4 positions shown; findings below may reference images not displayed]

FINDINGS: No sign of fracture or dislocation. Osteopenia.

Mild degenerative changes worse in the medial compartment. No
significant effusion.
IMPRESSION: No fracture or dislocation. Mild degenerative changes.

## 2021-12-09 ENCOUNTER — Ambulatory Visit: Payer: Medicare Other | Admitting: Cardiology

## 2023-04-25 ENCOUNTER — Other Ambulatory Visit (HOSPITAL_BASED_OUTPATIENT_CLINIC_OR_DEPARTMENT_OTHER): Payer: Self-pay
